# Patient Record
Sex: Male | Born: 1980 | Race: White | Hispanic: No | Marital: Single | Smoking: Former smoker
Health system: Southern US, Community
[De-identification: ages and names within clinical notes are randomized; demographics above are authoritative.]

## PROBLEM LIST (undated history)

## (undated) DIAGNOSIS — I4510 Unspecified right bundle-branch block: Secondary | ICD-10-CM

## (undated) DIAGNOSIS — J302 Other seasonal allergic rhinitis: Secondary | ICD-10-CM

## (undated) DIAGNOSIS — R200 Anesthesia of skin: Secondary | ICD-10-CM

## (undated) DIAGNOSIS — Z973 Presence of spectacles and contact lenses: Secondary | ICD-10-CM

## (undated) DIAGNOSIS — C20 Malignant neoplasm of rectum: Secondary | ICD-10-CM

## (undated) DIAGNOSIS — E46 Unspecified protein-calorie malnutrition: Secondary | ICD-10-CM

## (undated) DIAGNOSIS — T451X5A Adverse effect of antineoplastic and immunosuppressive drugs, initial encounter: Secondary | ICD-10-CM

## (undated) DIAGNOSIS — L309 Dermatitis, unspecified: Secondary | ICD-10-CM

## (undated) DIAGNOSIS — Z8672 Personal history of thrombophlebitis: Secondary | ICD-10-CM

## (undated) DIAGNOSIS — E611 Iron deficiency: Secondary | ICD-10-CM

## (undated) DIAGNOSIS — D630 Anemia in neoplastic disease: Secondary | ICD-10-CM

## (undated) DIAGNOSIS — Z9221 Personal history of antineoplastic chemotherapy: Secondary | ICD-10-CM

## (undated) DIAGNOSIS — Z923 Personal history of irradiation: Secondary | ICD-10-CM

## (undated) DIAGNOSIS — M1990 Unspecified osteoarthritis, unspecified site: Secondary | ICD-10-CM

## (undated) DIAGNOSIS — E8809 Other disorders of plasma-protein metabolism, not elsewhere classified: Secondary | ICD-10-CM

## (undated) DIAGNOSIS — Z933 Colostomy status: Secondary | ICD-10-CM

## (undated) DIAGNOSIS — K529 Noninfective gastroenteritis and colitis, unspecified: Secondary | ICD-10-CM

## (undated) DIAGNOSIS — G620 Drug-induced polyneuropathy: Secondary | ICD-10-CM

## (undated) DIAGNOSIS — R110 Nausea: Secondary | ICD-10-CM

## (undated) HISTORY — PX: WISDOM TOOTH EXTRACTION: SHX21

## (undated) HISTORY — DX: Dermatitis, unspecified: L30.9

---

## 2014-01-22 HISTORY — PX: COLONOSCOPY W/ BIOPSIES: SHX1374

## 2014-01-26 ENCOUNTER — Other Ambulatory Visit: Payer: Self-pay | Admitting: Gastroenterology

## 2014-01-26 DIAGNOSIS — K6289 Other specified diseases of anus and rectum: Secondary | ICD-10-CM

## 2014-01-29 ENCOUNTER — Encounter (INDEPENDENT_AMBULATORY_CARE_PROVIDER_SITE_OTHER): Payer: Self-pay

## 2014-01-29 ENCOUNTER — Other Ambulatory Visit: Payer: Self-pay | Admitting: Gastroenterology

## 2014-01-29 ENCOUNTER — Ambulatory Visit
Admission: RE | Admit: 2014-01-29 | Discharge: 2014-01-29 | Disposition: A | Discharge status: Home / Self Care | Payer: 59 | Source: Ambulatory Visit | Attending: Gastroenterology | Admitting: Gastroenterology

## 2014-01-29 DIAGNOSIS — K6289 Other specified diseases of anus and rectum: Secondary | ICD-10-CM

## 2014-01-29 MED ORDER — IOHEXOL 300 MG/ML  SOLN
100.0000 mL | Freq: Once | INTRAMUSCULAR | Status: AC | PRN
  Administered 2014-01-29: 100 mL via INTRAVENOUS

## 2014-01-29 NOTE — Addendum Note (Signed)
Addended by: Arta Silence on: 01/29/2014 04:06 PM   Modules accepted: Orders

## 2014-01-30 ENCOUNTER — Encounter (HOSPITAL_COMMUNITY)
Admission: RE | Disposition: A | Discharge status: Home / Self Care | Payer: Self-pay | Source: Ambulatory Visit | Attending: Gastroenterology

## 2014-01-30 ENCOUNTER — Ambulatory Visit (HOSPITAL_COMMUNITY)
Admission: RE | Admit: 2014-01-30 | Discharge: 2014-01-30 | Disposition: A | Discharge status: Home / Self Care | Payer: 59 | Source: Ambulatory Visit | Attending: Gastroenterology | Admitting: Gastroenterology

## 2014-01-30 ENCOUNTER — Encounter (INDEPENDENT_AMBULATORY_CARE_PROVIDER_SITE_OTHER): Payer: Self-pay | Admitting: General Surgery

## 2014-01-30 ENCOUNTER — Encounter (HOSPITAL_COMMUNITY): Payer: Self-pay | Admitting: Gastroenterology

## 2014-01-30 DIAGNOSIS — C20 Malignant neoplasm of rectum: Secondary | ICD-10-CM | POA: Insufficient documentation

## 2014-01-30 HISTORY — PX: EUS: SHX5427

## 2014-01-30 SURGERY — ULTRASOUND, LOWER GI TRACT, ENDOSCOPIC
Anesthesia: Moderate Sedation

## 2014-01-30 MED ORDER — FENTANYL CITRATE 0.05 MG/ML IJ SOLN
INTRAMUSCULAR | Status: AC
  Filled 2014-01-30: qty 2

## 2014-01-30 MED ORDER — DIPHENHYDRAMINE HCL 50 MG/ML IJ SOLN
INTRAMUSCULAR | Status: AC
  Filled 2014-01-30: qty 1

## 2014-01-30 MED ORDER — MIDAZOLAM HCL 10 MG/2ML IJ SOLN
INTRAMUSCULAR | Status: DC | PRN
  Administered 2014-01-30: 4 mg via INTRAVENOUS
  Administered 2014-01-30 (×2): 2 mg via INTRAVENOUS

## 2014-01-30 MED ORDER — SODIUM CHLORIDE 0.9 % IV SOLN: INTRAVENOUS | Status: DC

## 2014-01-30 MED ORDER — SODIUM CHLORIDE 0.9 % IV SOLN
INTRAVENOUS | Status: DC
  Administered 2014-01-30: 500 mL via INTRAVENOUS

## 2014-01-30 MED ORDER — FENTANYL CITRATE 0.05 MG/ML IJ SOLN
INTRAMUSCULAR | Status: DC | PRN
  Administered 2014-01-30: 25 ug via INTRAVENOUS
  Administered 2014-01-30: 50 ug via INTRAVENOUS
  Administered 2014-01-30: 25 ug via INTRAVENOUS

## 2014-01-30 MED ORDER — SPOT INK MARKER SYRINGE KIT
PACK | SUBMUCOSAL | Status: AC
  Filled 2014-01-30: qty 5

## 2014-01-30 MED ORDER — MIDAZOLAM HCL 5 MG/ML IJ SOLN
INTRAMUSCULAR | Status: AC
  Filled 2014-01-30: qty 2

## 2014-01-30 NOTE — Op Note (Signed)
[facility] [place], [address]   OPERATIVE PROCEDURE REPORT  PATIENT: Seth Villanueva, Seth Villanueva  MR#: [id number] BIRTHDATE: 08-11-1980  GENDER: Male ENDOSCOPIST: Arta Silence, MD REFERRED BY:  Daiva Eves, M.D., Teena Irani, MD PROCEDURE DATE:  01/30/2014 PROCEDURE:   Flexible sigmoidoscopy EUS, submucosal injection, mucosal biopsies ASA CLASS:   Class I INDICATIONS:1.  rectal mass. MEDICATIONS: Fentanyl 100 mcg IV and Versed 8 mg IV  DESCRIPTION OF PROCEDURE:   After the risks benefits and alternatives of the procedure were thoroughly explained, informed consent was obtained.  Throughout the procedure, the patients blood pressure, pulse and oxygen saturations were monitored continuously. Under direct visualization, the radial echoendoscope followed by standard gastroscope was introduced through the anus  and advanced to the sigmoid colon .  Water was used as necessary to provide an acoustic interface.  Imaging was obtained at 7.5 and 12Mhz. Upon completion of the imaging, water was removed and the patient was sent to the recovery room in satisfactory condition.    FINDINGS:   Sigmoidoscopy:  Tumor palpable on rectal exam, left posterior wall of distal rectum.  Tumor friable, exophytic, with large deep central ulceration.   Tumor extends from about 10cm from the anal verge nearly to the anal verge itself. After completion of ultrasound exam, the tumor was biopsied extensively with cold biopsy forceps.  The proximal and lateral margins of the tumor were marked with submucosal injection of Niger Ink. Rectal ultrasound:  Tumor is 50% circumferential.  Tumor penetrates through muscularis propria in multiple locations.  Several malignant-appearing peritumoral lymph nodes seen.  STAGING: T3 N2 Mx by endorectal ultrasound.  ENDOSCOPIC IMPRESSION: As above.  Locally advanced rectal mass.  Repeat biopsies done (prior biopsies showed high grade  dysplasia but no malignancy); imaging characteristics highly suggestive of locally invasive malignancy.  RECOMMENDATIONS: 1.  Watch for potential complications of procedure. 2.  Await biopsy results (sent "rush"). 3.  Based on today's findings, patient would highly likely need chemoradiative therapy prior to consideration of surgical intervention.   _______________________________ Lorrin MaisArta Silence, MD 01/30/2014 9:08 AM   CC:

## 2014-01-30 NOTE — H&P (Signed)
Patient interval history reviewed.  Patient examined again.  There has been no change from documented H/P dated 01/22/14 (scanned into chart from our office) except as documented above.  Assessment:  1.  Rectal cancer.  Plan:  1.  Endorectal ultrasound. 2.  Tattoo tumor site. 3.  Risks (bleeding, infection, bowel perforation that could require surgery, sedation-related changes in cardiopulmonary systems), benefits (identification and possible treatment of source of symptoms, exclusion of certain causes of symptoms), and alternatives (watchful waiting, radiographic imaging studies, empiric medical treatment) of endorectal ultrasound were explained to patient/family in detail and patient wishes to proceed.

## 2014-01-30 NOTE — Discharge Instructions (Signed)
Endorectal ultrasound  Post procedure instructions:  Read the instructions outlined below and refer to this sheet in the next few weeks. These discharge instructions provide you with general information on caring for yourself after you leave the hospital. Your doctor may also give you specific instructions. While your treatment has been planned according to the most current medical practices available, unavoidable complications occasionally occur. If you have any problems or questions after discharge, call Dr. Paulita Fujita at [facility] [phone]).  HOME CARE INSTRUCTIONS  ACTIVITY:  You may resume your regular activity, but move at a slower pace for the next 24 hours.   Take frequent rest periods for the next 24 hours.   Walking will help get rid of the air and reduce the bloated feeling in your belly (abdomen).   No driving for 24 hours (because of the medicine (anesthesia) used during the test).   You may shower.   Do not sign any important legal documents or operate any machinery for 24 hours (because of the anesthesia used during the test).  NUTRITION:  Drink plenty of fluids.   You may resume your normal diet as instructed by your doctor.   Begin with a light meal and progress to your normal diet. Heavy or fried foods are harder to digest and may make you feel sick to your stomach (nauseated).   Avoid alcoholic beverages for 24 hours or as instructed.  MEDICATIONS:  You may resume your normal medications unless your doctor tells you otherwise.  WHAT TO EXPECT TODAY:  Some feelings of bloating in the abdomen.   Passage of more gas than usual.   Spotting of blood in your stool or on the toilet paper.  IF YOU HAD POLYPS REMOVED DURING THE COLONOSCOPY:  No aspirin products for 7 days or as instructed.   No alcohol for 7 days or as instructed.   Eat a soft diet for the next 24 hours.   FINDING OUT THE RESULTS OF YOUR TEST  Not all test results are available  during your visit. If your test results are not back during the visit, make an appointment with your caregiver to find out the results. Do not assume everything is normal if you have not heard from your caregiver or the medical facility. It is important for you to follow up on all of your test results.     SEEK IMMEDIATE MEDICAL CARE IF:   You have more than a spotting of blood in your stool.   Your belly is swollen (abdominal distention).   You are nauseated or vomiting.   You have a fever.   You have abdominal pain or discomfort that is severe or gets worse throughout the day.    Document Released: 03/10/2004 Document Revised: 04/08/2011 Document Reviewed: 03/08/2008 [facility] Patient Information 2012 [place]. Transrectal Ultrasonography Transrectal ultrasonography is a procedure that sends and receives sound waves through the wall of your rectum into your prostate gland, which is situated in front of your rectum. The sound waves create an image of your prostate gland and nearby tissues. The image may be used to judge the size and shape of your prostate gland and nearby structures. LET [facility] CARE PROVIDER KNOW ABOUT:  Any allergies you have.  All medicines you are taking, including vitamins, herbs, eye drops, creams, and over-the-counter medicines.  Any blood disorders you have. BEFORE THE PROCEDURE Your health care provider may instruct you to use an enema 1-4 hours before the procedure. PROCEDURE You will lie  on an exam table, on your left side, with your knees bent up to your chest. A probe (transducer) will be lubricated with a clear gel and inserted into your rectum. You may experience a feeling of fullness of your rectum at this time. The transducer will be rotated slightly several times during the procedure to view the prostate gland and other structures from different angles. If blood flow is being checked, you may hear a "whoosh, whoosh" sound. AFTER THE  PROCEDURE You will be allowed to go home as soon as the procedure has been completed. Document Released: 05/06/2005 Document Revised: 03/29/2013 Document Reviewed: 01/16/2013 [facility] Patient Information 2015 [place], [place]. This information is not intended to replace advice given to you by your health care provider. Make sure you discuss any questions you have with your health care provider.

## 2014-01-31 ENCOUNTER — Encounter (HOSPITAL_COMMUNITY): Payer: Self-pay | Admitting: Gastroenterology

## 2014-01-31 ENCOUNTER — Ambulatory Visit (INDEPENDENT_AMBULATORY_CARE_PROVIDER_SITE_OTHER): Payer: 59 | Admitting: Surgery

## 2014-02-01 ENCOUNTER — Other Ambulatory Visit (INDEPENDENT_AMBULATORY_CARE_PROVIDER_SITE_OTHER): Payer: Self-pay | Admitting: General Surgery

## 2014-02-01 ENCOUNTER — Encounter (INDEPENDENT_AMBULATORY_CARE_PROVIDER_SITE_OTHER): Payer: Self-pay | Admitting: General Surgery

## 2014-02-01 ENCOUNTER — Ambulatory Visit (INDEPENDENT_AMBULATORY_CARE_PROVIDER_SITE_OTHER): Payer: 59 | Admitting: General Surgery

## 2014-02-01 ENCOUNTER — Telehealth (INDEPENDENT_AMBULATORY_CARE_PROVIDER_SITE_OTHER): Payer: Self-pay | Admitting: General Surgery

## 2014-02-01 ENCOUNTER — Ambulatory Visit
Admission: RE | Admit: 2014-02-01 | Discharge: 2014-02-01 | Disposition: A | Discharge status: Home / Self Care | Payer: 59 | Source: Ambulatory Visit | Attending: General Surgery | Admitting: General Surgery

## 2014-02-01 ENCOUNTER — Telehealth: Payer: Self-pay | Admitting: *Deleted

## 2014-02-01 VITALS — BP 129/86 | HR 77 | Temp 97.4°F | Resp 18 | Ht 74.0 in | Wt 158.2 lb

## 2014-02-01 DIAGNOSIS — C20 Malignant neoplasm of rectum: Secondary | ICD-10-CM

## 2014-02-01 LAB — CBC WITH DIFFERENTIAL/PLATELET
BASOS ABS: 0 10*3/uL (ref 0.0–0.1)
Basophils Relative: 0 % (ref 0–1)
EOS ABS: 0.3 10*3/uL (ref 0.0–0.7)
EOS PCT: 4 % (ref 0–5)
HEMATOCRIT: 46.4 % (ref 39.0–52.0)
Hemoglobin: 16.5 g/dL (ref 13.0–17.0)
Lymphocytes Relative: 17 % (ref 12–46)
Lymphs Abs: 1.2 10*3/uL (ref 0.7–4.0)
MCH: 31.6 pg (ref 26.0–34.0)
MCHC: 35.6 g/dL (ref 30.0–36.0)
MCV: 88.9 fL (ref 78.0–100.0)
MONO ABS: 0.8 10*3/uL (ref 0.1–1.0)
Monocytes Relative: 11 % (ref 3–12)
Neutro Abs: 4.7 10*3/uL (ref 1.7–7.7)
Neutrophils Relative %: 68 % (ref 43–77)
PLATELETS: 183 10*3/uL (ref 150–400)
RBC: 5.22 MIL/uL (ref 4.22–5.81)
RDW: 13.4 % (ref 11.5–15.5)
WBC: 6.9 10*3/uL (ref 4.0–10.5)

## 2014-02-01 MED ORDER — IOHEXOL 300 MG/ML  SOLN
75.0000 mL | Freq: Once | INTRAMUSCULAR | Status: AC | PRN
  Administered 2014-02-01: 75 mL via INTRAVENOUS

## 2014-02-01 NOTE — Patient Instructions (Signed)
You have rectal cancer By ultrasound, you are uT3N2 which suggests the cancer is in the lymph nodes around your rectum because of this we are recommending starting with chemotherapy and radiation to shrink the mass.  Seth Villanueva is the GI navigator. Her number is [phone] We will complete your workup which includes CT chest and labs.  You will be scheduled to see the medical oncologist and radiation oncologist.   Colorectal Cancer Colorectal cancer is an abnormal growth of tissue (tumor) in the colon or rectum that is cancerous (malignant). Unlike noncancerous (benign) tumors, malignant tumors can spread to other parts of your body. The colon is the large bowel or large intestine. The rectum is the last several inches of the colon.  RISK FACTORS The exact cause of colorectal cancer is unknown. However, the following factors may increase your chances of getting colorectal cancer:   Age older than 30 years.   Abnormal growths (polyps) on the inner wall of the colon or rectum.   Diabetes.   African American race.   Family history of hereditary nonpolyposis colorectal cancer. This condition is caused by changes in the genes that are responsible for repairing mismatched DNA.   Personal history of cancer. A person who has already had colorectal cancer may develop it a second time. Also, women with a history of ovarian, uterine, or breast cancer are at a somewhat higher risk of developing colorectal cancer.  Certain hereditary conditions.  Eating a diet that is high in fat (especially animal fat) and low in fiber, fruits, and vegetables.  Sedentary lifestyle.  Inflammatory bowel disease, including ulcerative colitis and Crohn's disease.   Smoking.   Excessive alcohol use.  SYMPTOMS Early colorectal cancer often does not cause symptoms. As the cancer grows, symptoms may include:   Changes in bowel habits.  Diarrhea.   Constipation.   Feeling like the bowel does not empty  completely after a bowel movement.   Blood in the stool.   Stools that are narrower than usual.   Abdominal discomfort, pain, bloating, fullness, or cramps.  Frequent gas pain.   Unexplained weight loss.   Constant tiredness.   Nausea and vomiting.  DIAGNOSIS  Your health care provider will ask about your medical history. He or she may also perform a number of procedures, such as:   A physical exam.  A digital rectal exam.  A fecal occult blood test.  A barium enema.  Blood tests.   X-rays.   Imaging tests, such as CT scans or MRIs.   Taking a tissue sample (biopsy) from your colon or rectum to look for cancer cells.   A sigmoidoscopy to view the inside of the last part of your colon.   A colonoscopy to view the inside of your entire colon.   An endorectal ultrasound to see how deep a rectal tumor has grown and whether the cancer has spread to lymph nodes or other nearby tissues.  Your cancer will be staged to determine its severity and extent. Staging is a careful attempt to find out the size of the tumor, whether the cancer has spread, and if so, to what parts of the body. You may need to have more tests to determine the stage of your cancer. The test results will help determine what treatment plan is best for you.   Stage 0--The cancer is found only in the innermost lining of the colon or rectum.   Stage I--The cancer has grown into the inner wall of the  colon or rectum. The cancer has not yet reached the outer wall of the colon.   Stage II--The cancer extends more deeply into or through the wall of the colon or rectum. It may have invaded nearby tissue, but cancer cells have not spread to the lymph nodes.   Stage III--The cancer has spread to nearby lymph nodes but not to other parts of the body.   Stage IV--The cancer has spread to other parts of the body, such as the liver or lungs.  Your health care provider may tell you the detailed stage  of your cancer, which includes both a number and a letter.  TREATMENT  Depending on the type and stage, colorectal cancer may be treated with surgery, radiation therapy, chemotherapy, targeted therapy, or radiofrequency ablation. Some people have a combination of these therapies. Surgery may be done to remove the polyps from your colon. In early stages, your health care provider may be able to do this during a colonoscopy. In later stages, surgery may be done to remove part of your colon.  HOME CARE INSTRUCTIONS   Only take over-the-counter or prescription medicines for pain, discomfort, or fever as directed by your health care provider.   Maintain a healthy diet.   Consider joining a support group. This may help you learn to cope with the stress of having colorectal cancer.   Seek advice to help you manage treatment of side effects.   Keep all follow-up appointments as directed by your health care provider.   Inform your cancer specialist if you are admitted to the hospital.  SEEK MEDICAL CARE IF:  Your diarrhea or constipation does not go away.   Your bowel habits change.  You have increased abdominal pain.   You notice new fatigue or weakness.  You lose weight. Document Released: 07/27/2005 Document Revised: 08/01/2013 Document Reviewed: 01/19/2013 [facility] Patient Information 2015 [place], [place]. This information is not intended to replace advice given to you by your health care provider. Make sure you discuss any questions you have with your health care provider.

## 2014-02-01 NOTE — Telephone Encounter (Signed)
Spoke with patient by phone and confirmed appointments with Dr. Benay Spice and Dr. Lisbeth Renshaw.  Contact names, numbers, and directions were provided.

## 2014-02-01 NOTE — Telephone Encounter (Signed)
Message copied by Maryclare Bean on Thu Feb 01, 2014  4:07 PM ------      Message from: Redmond Pulling, ERIC M      Created: Thu Feb 01, 2014  3:56 PM       pls call pt and let him know CT chest normal - no abnormalities ------

## 2014-02-01 NOTE — Progress Notes (Signed)
Patient ID: Seth Villanueva., male   DOB: 1981-03-10, 33 y.o.   MRN: [id number]  Chief Complaint  Patient presents with  . Rectal Pain  . Rectal Problems    HPI Seth Villanueva. is a 33 y.o. male.  HPI 33 year old Caucasian male referred by Dr. Teena Irani for evaluation of a rectal mass. The patient states he started to have problems in November. He describes it initially as bloody bowel movements. He initially contributed it to hemorrhoidal problems. He saw physician and was given suppositories which stopped the bleeding. He then had a return of blood and mucus in his stools along with now pain. He used suppositories again and had some relief but not complete relief. He ultimately ended up seeing GI imaging and was found to have a rectal mass on physical exam. 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 He then underwent a sigmoidoscopy and rectal ultrasound by Dr. Paulita Fujita which confirmed a more in-depth lesion a repeat biopsy this confirmed adenocarcinoma. The area was tattooed. He is underwent a CT scan of his abdomen and pelvis. He is sent here for surgical consultation.  He denies any significant pain right now with bowel movements. He reports daily bowel movements. He is not having to take any laxatives.he denies any abdominal pain or bloating. He denies any nausea or vomiting. He states he has had some dark blood since the biopsies. He reports a good appetite however he has lost some weight. Prior to all this he used to weigh 174 and now weighs around 160 pounds. However prior to being diagnosed he was exercising regularly until lost some weight through diet and exercise. He denies any difficulty urinating. He denies any family history of GI cancer. Her paternal grandmother had thyroid cancer. A paternal great aunt had breast cancer.  He works for [employer]. He used to smoke occasionally less  than one pack a month but stopped many years ago. He denies any drugs or alcohol. Past Medical History  Diagnosis Date  . Rectal polyp 2015  . Allergy     Past Surgical History  Procedure Laterality Date  . No past surgeries    . Wisdom tooth extraction    . Eus N/A 01/30/2014    Procedure: LOWER ENDOSCOPIC ULTRASOUND (EUS);  Surgeon: Arta Silence, MD;  [facility];  Service: Endoscopy;  Laterality: N/A;    History reviewed. No pertinent family history.  Social History History  Substance Use Topics  . Smoking status: Former Smoker    Types: Cigarettes    Quit date: 02/02/2012  . Smokeless tobacco: Not on file  . Alcohol Use: No    Allergies  Allergen Reactions  . Zithromax [Azithromycin] Hives    Current Outpatient Prescriptions  Medication Sig Dispense Refill  . acetaminophen (TYLENOL) 325 MG tablet Take 650 mg by mouth as needed.      . hydrocortisone (ANUSOL-HC) 25 MG suppository Place 25 mg rectally daily.      Marland Kitchen ibuprofen (ADVIL,MOTRIN) 200 MG tablet Take 200 mg by mouth every 6 (six) hours as needed.       No current facility-administered medications for this visit.    Review of Systems Review of Systems  Constitutional: Positive for appetite change and unexpected weight change. Negative for fever, chills and activity change.  HENT: Negative for congestion and trouble swallowing.   Eyes: Negative for visual disturbance.  Respiratory: Negative for chest tightness  and shortness of breath.   Cardiovascular: Negative for chest pain and leg swelling.       No PND, no orthopnea, no DOE  Gastrointestinal: Positive for blood in stool. Negative for nausea, vomiting, abdominal pain, constipation and abdominal distention.       See HPI  Genitourinary: Negative for dysuria, hematuria and difficulty urinating.       Variocele  Musculoskeletal: Negative.   Skin: Negative for rash.  Neurological: Negative for dizziness, seizures and speech difficulty.   Hematological: Does not bruise/bleed easily.  Psychiatric/Behavioral: Negative for behavioral problems and confusion.    Blood pressure 129/86, pulse 77, temperature 97.4 F (36.3 C), temperature source Temporal, resp. rate 18, height 6\' 2"  (1.88 m), weight 158 lb 3.2 oz (71.759 kg).  Physical Exam Physical Exam  Vitals reviewed. Constitutional: He is oriented to person, place, and time. He appears well-developed and well-nourished. No distress.  thin  HENT:  Head: Normocephalic and atraumatic.  Right Ear: External ear normal.  Left Ear: External ear normal.  Eyes: Conjunctivae are normal. No scleral icterus.  Neck: Normal range of motion. Neck supple. No tracheal deviation present. No thyromegaly present.  Cardiovascular: Normal rate, normal heart sounds and intact distal pulses.   Pulmonary/Chest: Effort normal and breath sounds normal. No respiratory distress. He has no wheezes.  Abdominal: Soft. He exhibits no distension. There is no tenderness. There is no rebound and no guarding.  Genitourinary: Testes normal and penis normal. Rectal exam shows mass. Rectal exam shows no external hemorrhoid, no fissure and anal tone normal.  Mass located in the posterior anus and rectum. It is palpable does not appear to be fixed. Tender to palpation in the area. Approximately 2-1/2 cm from the anal verge.  Musculoskeletal: Normal range of motion. He exhibits no edema and no tenderness.  Lymphadenopathy:    He has no cervical adenopathy.       Right: No inguinal adenopathy present.       Left: No inguinal adenopathy present.  Neurological: He is alert and oriented to person, place, and time. He exhibits normal muscle tone.  Skin: Skin is warm and dry. No rash noted. He is not diaphoretic. No erythema. No pallor.  Psychiatric: He has a normal mood and affect. His behavior is normal. Judgment and thought content normal.    Data Reviewed Rectal ultrasound - 01/30/2014 - sigmoidoscopy--tumor  palpable on rectal exam, left posterior wall the distal rectum. Tumor friable, exophytic, with large deep central ulceration. Tumor extends for about 10 cm from the anal verge nearly to the anal verge itself. Tumor biopsy. Proximal and lateral margins of the tumor were marked with Niger ink. Rectal ultrasound: Tumors 50% circumferential. Tumor penetrates through muscularis propria in multiple locations. Several malignant appearing peritumoral lymph node seens - uT3uN2Mx CT scan 01/29/2014-normal liver. No evidence of hepatic metastasis disease. Eccentric left-sided rectal wall lesion measuring 3.4 x 2.9 cm. There are enlarged perirectal lymph nodes. There is 11 mm lymph node on the left side and an enlarged presacral lymph node bilaterally. No iliac or retroperitoneal adenopathy. Dr Amedeo Plenty note Cbc, cmet[facility] - wbc 6.1; hgb 16.3, hct 45.6, plt 167; CMET wnl Cr 0.99; CEA 3.9 Colonoscopy 01/26/2014-fungating nonobstructing large mass in the distal rectum. The mass was partially circumferential-involving one half of the lumens of circumference-. The mass measured 4 cm in length. In addition its diameter measured 25 mm. Colonoscopy was performed to the cecum. Biopsy results from his colonoscopy demonstrated superficial fragments of  tubular villous adenoma with focal high-grade dysplasia Assessment    Locally advanced low rectal cancer (uT3uN2Mx - rectal ultrasound)     Plan    He is accompanied by his mother and father. We went over the workup done to date. We discussed rectal cancer. We discussed its overall management. On CT imaging it does not appear to involve the pelvic floor musculature. On physical exam does not appear to be fixed. He is not having any obstructive symptoms  Therefore I am recommending proceeding with neoadjuvant chemotherapy and radiation.   I explained that generally when rectal cancer is locally advanced we try to shrink the tumor with chemotherapy and  radiation. Once he has undergone a round of treatment, I explained he would be restaged to determine his response to treatment and generally surgery would follow.  We had a very prolonged conversation. They were given information and education materials.  His mother was quite anxious to begin treatment. She appear to be mildly frustrated that he was not diagnosed sooner.  We briefly discussed surgical options. I explained that part of the decision depends on how he responds to treatment. His lesion is very low and close to the anal verge. I explained that our primary goal is to do the procedure and treatment course that gives him the best chance for long-term survival. Secondly our goal would try to do a sphincter sparing procedure. However I cautioned them that I cannot guarantee at this time that he may not end up being recommended for an abdominal perineal resection. I explained that when the time for surgery comes he will have at least a temporary ostomy of some form or perhaps a permanent colostomy. We discussed the rationale of why he may need a temporary ileostomy if by chance we're able to do a sphincter sparing procedure.  We will go ahead and order a CT scan of his chest to complete his oncologic workup. Will also repeat some of his labs to make sure his creatinine did not bump after his most recent CT scan. We'll also request urgent appointments with medical and radiation oncology. I discussed the case with Dr. Benay Spice.  I spent 60 minutes with the patient and his family, more than 50% was spent in counseling and coordinating care.  Seth Ruff. Redmond Pulling, MD, FACS General, Bariatric, & Minimally Invasive Surgery [facility], [place]        [facility] M 02/01/2014, 9:21 AM

## 2014-02-01 NOTE — Telephone Encounter (Signed)
Spoke with the mother and went over the CT chest and told her that it was normal. The patient has an apt on 02-07-2014 with Dr Lisbeth Renshaw and I told her that Marcellus Scott is working an apt with Dr Benay Spice and she may get an apt this week or when he is over at the cancer center. But I did tell her once we get the lab work back me or Dr Redmond Pulling will call them

## 2014-02-02 ENCOUNTER — Telehealth (INDEPENDENT_AMBULATORY_CARE_PROVIDER_SITE_OTHER): Payer: Self-pay | Admitting: General Surgery

## 2014-02-02 ENCOUNTER — Encounter: Payer: Self-pay | Admitting: Radiation Oncology

## 2014-02-02 LAB — CEA: CEA: 2.4 ng/mL (ref 0.0–5.0)

## 2014-02-02 LAB — COMPREHENSIVE METABOLIC PANEL
ALK PHOS: 106 U/L (ref 39–117)
ALT: 16 U/L (ref 0–53)
AST: 14 U/L (ref 0–37)
Albumin: 4.8 g/dL (ref 3.5–5.2)
BILIRUBIN TOTAL: 0.7 mg/dL (ref 0.2–1.2)
BUN: 14 mg/dL (ref 6–23)
CO2: 25 mEq/L (ref 19–32)
CREATININE: 0.97 mg/dL (ref 0.50–1.35)
Calcium: 9.2 mg/dL (ref 8.4–10.5)
Chloride: 103 mEq/L (ref 96–112)
Glucose, Bld: 90 mg/dL (ref 70–99)
Potassium: 4 mEq/L (ref 3.5–5.3)
SODIUM: 142 meq/L (ref 135–145)
TOTAL PROTEIN: 7.2 g/dL (ref 6.0–8.3)

## 2014-02-02 NOTE — Progress Notes (Signed)
GU Location of Tumor / Histology: Rectum   Seth Villanueva. presented  months ago with signs/symptoms of: bloody bowel movements since November,2014, thought was heorrhoids,sawMD given suppositories which stopped bleeding ,then had return of blood and mucus in stools with pain  Biopsies of   revealed: Diagnosis 01/30/14:  Rectum, biopsy, mass- 6 pieces - INVASIVE ADENOCARCINOMA, Dr. Shon Baton  Past/Anticipated interventions by urology, if PZW:CHENIDPOEUMPN and rectal U/S  by Dr.Outlaw, referral to Dr. [name].Seth Villanueva,  Who saw patient 02/01/14   Past/Anticipated interventions by medical oncology, if any: Appt Dr.Sherrill 02/06/14,   Weight changes, if any: 14lb wt.loss  Bowel/Bladder complaints, if any: no bleeding,   Daily bm,no dysuria, painm after bm today   Nausea/Vomiting, if any: None  Pain issues, if any: mild discomfort from hemorrhoids   SAFETY ISSUES:  Prior radiation? No  Pacemaker/ICD? No  Is the patient on methotrexate?  No  Current Complaints / other details:   Single, paternal grandmother thyroid  Cancer,paternal great aunt breast cance, mother,father living, smoked cigarettes remotely, quit years ago, no alcohol or illicit drugs

## 2014-02-02 NOTE — Telephone Encounter (Signed)
Message copied by Maryclare Bean on Fri Feb 02, 2014 12:03 PM ------      Message from: Crisoforo Oxford      Created: Fri Feb 02, 2014 11:53 AM      Regarding: test results      Contact: [place] pt mom called to get test results, pls call and advise . ty TT ------

## 2014-02-02 NOTE — Telephone Encounter (Signed)
I called the mother back and told her that the lab work is not back and once it does it will be me or Dr. Redmond Pulling will call them with the results.

## 2014-02-03 ENCOUNTER — Telehealth (INDEPENDENT_AMBULATORY_CARE_PROVIDER_SITE_OTHER): Payer: Self-pay | Admitting: General Surgery

## 2014-02-03 NOTE — Telephone Encounter (Signed)
Called pt to let him know labs were ok. Explained purpose of CEA number

## 2014-02-06 ENCOUNTER — Ambulatory Visit (HOSPITAL_BASED_OUTPATIENT_CLINIC_OR_DEPARTMENT_OTHER): Payer: 59 | Admitting: Oncology

## 2014-02-06 ENCOUNTER — Telehealth: Payer: Self-pay | Admitting: Oncology

## 2014-02-06 ENCOUNTER — Encounter: Payer: Self-pay | Admitting: Oncology

## 2014-02-06 ENCOUNTER — Ambulatory Visit: Payer: 59

## 2014-02-06 VITALS — BP 138/77 | HR 68 | Temp 98.3°F | Resp 20 | Ht 74.0 in | Wt 158.4 lb

## 2014-02-06 DIAGNOSIS — C20 Malignant neoplasm of rectum: Secondary | ICD-10-CM

## 2014-02-06 NOTE — Telephone Encounter (Signed)
Gave pt appt appt for labs, rehab,nutrition, chemo class , lab and MD

## 2014-02-06 NOTE — Progress Notes (Signed)
[facility] New Patient Consult   Referring MD: Makayla Confer. 33 y.o.  1980-12-21    Reason for Referral: Rectal cancer   HPI: He reports an episode of rectal bleeding and diarrhea in November of 2014. His symptoms improved after using "hemorrhoid "suppositories. Beginning in January of this year she developed rectal pain that improved with ibuprofen . He reports passing mucous per rectum.  He was referred to Dr. Amedeo Plenty and on rectal exam and a polypoid mass was noted posteriorly. The stool was Hemoccult positive. He was taken to a colonoscopy 01/26/2014. A nonobstructing mass was found in the distal rectum. The mass was partially circumferential. The mass was biopsied. The exam was otherwise normal. The pathology revealed superficial fragments of a tubulovillous adenoma with focal high-grade dysplasia.  He was referred to Dr. Paulita Fujita for an endoscopic ultrasound 01/30/2014. Tumor was noted extending from the anal verge to 10 cm from the anal verge. The tumor was biopsied. The proximal and lateral margins were tattooed. Tumor was noted to penetrate through the muscular propria in multiple locations with several malignant-appearing. Tumor lymph nodes (T3 N2). The pathology ([id number]) confirmed invasive adenocarcinoma.  He was referred for CTs of the chest, abdomen, and pelvis 02/01/2014. No evidence of metastatic disease in the chest. CTs of the abdomen and pelvis 01/29/2014 revealed clear lung bases. No evidence of hepatic metastatic disease. A left-sided rectal wall mass was seen with enlarged perirectal lymph nodes. No iliac or retroperitoneal adenopathy. No inguinal mass or adenopathy.  He was referred to Dr. Redmond Pulling and was seen 02/01/2014. A mass was located in the posterior anus and rectum and did not appear fixed. The mass was measured approximately 2 cm from the anal verge.    Past Medical History  Diagnosis Date  .  2015  . Allergy   . Cancer  01/30/14    rectum-Invasive adenocarcinoma ([id number])     Past Surgical History  Procedure Laterality Date  . No past surgeries    . Wisdom tooth extraction    . Eus N/A 01/30/2014    Procedure: LOWER ENDOSCOPIC ULTRASOUND (EUS);  Surgeon: Arta Silence, MD;  Location[facility];  Service: Endoscopy;  Laterality: N/A;  . Rectal biopsy  01/30/14    Invasive Adenocarcinoma  . Colonoscopy w/ biopsies  01/26/14    fungating nonobstructing large mass distal rectum    Medications: Reviewed  Allergies:  Allergies  Allergen Reactions  . Zithromax [Azithromycin] Hives    Family history: His paternal grandmother had thyroid cancer. A paternal great aunt had rest cancer twice. No other family history of cancer. He has one brother.  Social History:   He lives with his parents in [place]. He works in a Cabin crew. He does not use tobacco or alcohol. No transfusion history. No risk factor for HIV or hepatitis.  History  Alcohol Use No    History  Smoking status  . Former Smoker  . Types: Cigarettes  . Quit date: 02/02/2012  Smokeless tobacco  . Not on file      ROS:   Positives include: 15 pound weight loss she relates to exercise, good appetite, mucous per rectum, rectal pain relieved with ibuprofen, recent "spider bite "  A complete ROS was otherwise negative.  Physical Exam:  Blood pressure 138/77, pulse 68, temperature 98.3 F (36.8 C), temperature source Oral, resp. rate 20, height 6\' 2"  (1.88 m), weight 158 lb 6.4 oz (71.85 kg).  HEENT: Oropharynx without  visible mass, neck without mass Lungs: Clear bilaterally Cardiac: Regular rate and rhythm Abdomen: No hepatosplenomegaly, nontender, no mass Rectal: There is a soft mass at the posterior rectum beginning approximately 2 cm from the anal verge GU: Testes without mass  Vascular: No leg edema Lymph nodes: No cervical or supraclavicular nodes. "Shotty "bilateral axillary and inguinal nodes Neurologic: Alert  and oriented, the motor exam appears grossly intact Skin: No rash Musculoskeletal: No spine tenderness   LAB:  CBC  Lab Results  Component Value Date   WBC 6.9 02/01/2014   HGB 16.5 02/01/2014   HCT 46.4 02/01/2014   MCV 88.9 02/01/2014   PLT 183 02/01/2014   NEUTROABS 4.7 02/01/2014     CMP      Component Value Date/Time   NA 142 02/01/2014 1037   K 4.0 02/01/2014 1037   CL 103 02/01/2014 1037   CO2 25 02/01/2014 1037   GLUCOSE 90 02/01/2014 1037   BUN 14 02/01/2014 1037   CREATININE 0.97 02/01/2014 1037   CALCIUM 9.2 02/01/2014 1037   PROT 7.2 02/01/2014 1037   ALBUMIN 4.8 02/01/2014 1037   AST 14 02/01/2014 1037   ALT 16 02/01/2014 1037   ALKPHOS 106 02/01/2014 1037   BILITOT 0.7 02/01/2014 1037    Lab Results  Component Value Date   CEA 2.4 02/01/2014    Imaging:  As per history of present illness, CT abdomen and pelvis 01/29/2014 reviewed   Assessment/Plan:   1. Rectal cancer, clinical stage III, distal rectal mass-approximate 2 cm from the anal verge, status post an endoscopic biopsy 01/30/2014 confirming an invasive adenocarcinoma  CTs of the chest, abdomen, and pelvis with no evidence of metastatic disease, malignant-appearing perirectal lymph nodes on the abdomen/pelvis CT 01/29/2014  EUS 01/30/2014 confirmed a uT3,uN2 lesion   2. Rectal pain secondary to #1   Disposition:   Mr. Kantner has been diagnosed with locally advanced rectal cancer. I discussed treatment options with Mr. Rodelo and his family. I explained the rationale behind neoadjuvant therapy. Hopefully he will be a candidate for sphincter sparing surgery following neoadjuvant chemotherapy and radiation.  I recommend concurrent capecitabine and radiation. We reviewed the potential toxicities associated with capecitabine including the chance for nausea, mucositis, diarrhea, and hematologic toxicity. We discussed the rash, hyperpigmentation, and hand/foot syndrome associated with capecitabine. We  discussed the radiation/chemotherapy skin breakdown that can be seen with combined therapy. He agrees to proceed. Mr. Charo will attend a chemotherapy teaching class.  He is scheduled to see Dr. Lisbeth Renshaw 02/07/2014. I anticipate he will start chemotherapy and radiation 02/19/2014.  We discussed the possibility of infertility following chemotherapy and radiation. He is interested in sperm banking and was given reading information on scheduling this.    [place], [place] 02/06/2014, 2:37 PM

## 2014-02-06 NOTE — Progress Notes (Signed)
Met with Seth Villanueva. and family. Explained role of nurse navigator. Educational information provided on colorectal cancer  Drug information on Xeloda was provided.  Referral made to dietician for diet education. Referral was made to PT for pre-habilitation.  [place] resources provided to patient, including SW service and GI support group information.  Patient was given information on sperm banking as well as contact numbers for appointments.  He was encouraged to contact this office if he needs assistance with obtaining an appointment.  Contact names and phone numbers were provided for entire [facility] team.  Teach back method was used.  His family was educated per MD on risk of colorectal cancer in first degree relative and the need for screening.  Patient and his family had no questions and verbalized understanding.  Will continue to follow as needed.

## 2014-02-07 ENCOUNTER — Encounter: Payer: Self-pay | Admitting: Radiation Oncology

## 2014-02-07 ENCOUNTER — Ambulatory Visit
Admission: RE | Admit: 2014-02-07 | Discharge: 2014-02-07 | Disposition: A | Discharge status: Home / Self Care | Payer: 59 | Source: Ambulatory Visit | Attending: Radiation Oncology | Admitting: Radiation Oncology

## 2014-02-07 VITALS — BP 134/85 | HR 89 | Temp 97.8°F | Resp 20 | Ht 74.0 in | Wt 158.1 lb

## 2014-02-07 DIAGNOSIS — Z510 Encounter for antineoplastic radiation therapy: Secondary | ICD-10-CM | POA: Diagnosis not present

## 2014-02-07 DIAGNOSIS — C20 Malignant neoplasm of rectum: Secondary | ICD-10-CM | POA: Diagnosis not present

## 2014-02-07 NOTE — Progress Notes (Signed)
Please see the Nurse Progress Note in the MD Initial Consult Encounter for this patient. 

## 2014-02-08 ENCOUNTER — Other Ambulatory Visit: Payer: Self-pay | Admitting: *Deleted

## 2014-02-08 ENCOUNTER — Other Ambulatory Visit: Payer: 59

## 2014-02-08 DIAGNOSIS — C20 Malignant neoplasm of rectum: Secondary | ICD-10-CM

## 2014-02-08 MED ORDER — PROCHLORPERAZINE MALEATE 10 MG PO TABS: 10.0000 mg | ORAL_TABLET | Freq: Four times a day (QID) | ORAL | Status: DC | PRN

## 2014-02-08 MED ORDER — HYDROCORTISONE ACETATE 25 MG RE SUPP: 25.0000 mg | Freq: Every day | RECTAL | Status: DC

## 2014-02-08 NOTE — Telephone Encounter (Signed)
Notified that script is ready as well as script for compazine. Have not sent out Xeloda script yet. He requests note from Dr. Benay Spice that he may return to work on 02/14/14-would like to pick it up on 02/13/14.

## 2014-02-08 NOTE — Progress Notes (Signed)
Radiation Oncology         [phone]) [phone] ________________________________  Name: Seth Villanueva. MRN: [id number]  Date: 02/07/2014  DOB: Jul 14, 1981  CV:ELFYBOF,BPZWC L, MD  Gayland Curry, MD   G. Kavin Leech, M.D.  REFERRING PHYSICIAN: Gayland Curry, MD   DIAGNOSIS: The encounter diagnosis was Rectal cancer.   HISTORY OF PRESENT ILLNESS::Seth Villanueva. is a 33 y.o. male who is seen for an initial consultation visit. The patient indicates that he experienced some rectal bleeding and diarrhea last year in November. However his symptoms improved although he began experiencing some rectal discomfort in January of 2015. This improved with ibuprofen.  The patient was noted on more recent rectal exam to have a rectal mass posteriorly. He was noted to be Hemoccult positive. The patient therefore was taken for a colonoscopy on 01/26/2014. A mass which was non-obstructing was present within the distal rectum. This was partially circumferential. A biopsy was obtained and this returned positive for focal high-grade dysplasia.  The patient proceeded to undergo an endoscopic ultrasound on 01/30/2014. Tumor was palpable on rectal exam along the left posterior wall of the distal rectum. The tumor was noted to be friable and exophytic with ulceration. This extended from about 10 cm from the anal verge to nearly the anal verge itself. On ultrasound the tumor was 50% circumferential. This corresponded to a T3 N2 tumor on ultrasound. Biopsy was obtained and this returned positive for invasive adenocarcinoma.  The patient underwent a CT scan of the chest abdomen and pelvis. No evidence of distant metastatic metastatic disease within the chest. A left-sided rectal wall lesion was seen within the pelvis measuring 3.4 cm. Enlarged and likely pathologic peri rectal lymph nodes were present. No iliac or retroperitoneal adenopathy and no inguinal adenopathy. No signs of hepatic metastatic disease.  The  patient has been seen by Dr. Redmond Pulling and surgery. On exam he felt that the tumor extended to approximately 2 cm from the anal verge. He discuss possible surgical options with the patient including an APR as well as sphincter sparing approaches. The patient also has been seen by Dr. Benay Spice in medical oncology who has recommended preoperative chemo radiotherapy, as did Dr. Redmond Pulling. I have therefore been asked to see the patient today for evaluation for preoperative radiation treatment.   PREVIOUS RADIATION THERAPY: No   PAST MEDICAL HISTORY:  has a past medical history of Rectal polyp (2015); Allergy; Cancer (01/30/14); and Anxiety.     PAST SURGICAL HISTORY: Past Surgical History  Procedure Laterality Date  . No past surgeries    . Wisdom tooth extraction    . Eus N/A 01/30/2014    Procedure: LOWER ENDOSCOPIC ULTRASOUND (EUS);  Surgeon: Arta Silence, MD;  Location: [facility] ENDOSCOPY;  Service: Endoscopy;  Laterality: N/A;  . Rectal biopsy  01/30/14    Invasive Adenocarcinoma  . Colonoscopy w/ biopsies  01/26/14    fungating nonobstructing large mass distal rectum     FAMILY HISTORY: family history includes Cancer in his maternal aunt, maternal aunt, and paternal grandmother; Cancer (age of onset: 32) in his maternal grandmother.   SOCIAL HISTORY:  reports that he quit smoking about 2 years ago. His smoking use included Cigarettes. He smoked 0.00 packs per day. He has quit using smokeless tobacco. He reports that he does not drink alcohol or use illicit drugs.   ALLERGIES: Zithromax   MEDICATIONS:  Current Outpatient Prescriptions  Medication Sig Dispense Refill  . acetaminophen (TYLENOL) 325 MG tablet  Take 650 mg by mouth as needed.      . hydrocortisone (ANUSOL-HC) 25 MG suppository Place 25 mg rectally daily.      Marland Kitchen ibuprofen (ADVIL,MOTRIN) 200 MG tablet Take 200 mg by mouth every 6 (six) hours as needed.       No current facility-administered medications for this encounter.      REVIEW OF SYSTEMS:  A 15 point review of systems is documented in the electronic medical record. This was obtained by the nursing staff. However, I reviewed this with the patient to discuss relevant findings and make appropriate changes.  Pertinent items are noted in HPI.    PHYSICAL EXAM:  height is 6\' 2"  (1.88 m) and weight is 158 lb 1.6 oz (71.714 kg). His oral temperature is 97.8 F (36.6 C). His blood pressure is 134/85 and his pulse is 89. His respiration is 20.   ECOG = 1  0 - Asymptomatic (Fully active, able to carry on all predisease activities without restriction)  1 - Symptomatic but completely ambulatory (Restricted in physically strenuous activity but ambulatory and able to carry out work of a light or sedentary nature. For example, light housework, office work)  2 - Symptomatic, <50% in bed during the day (Ambulatory and capable of all self care but unable to carry out any work activities. Up and about more than 50% of waking hours)  3 - Symptomatic, >50% in bed, but not bedbound (Capable of only limited self-care, confined to bed or chair 50% or more of waking hours)  4 - Bedbound (Completely disabled. Cannot carry on any self-care. Totally confined to bed or chair)  5 - Death   Eustace Pen MM, Creech RH, Tormey DC, et al. [phone]). "Toxicity and response criteria of the [facility]". [place] Oncol. 5 (6): 649-55  General: Well-developed, in no acute distress HEENT: Normocephalic, atraumatic; oral cavity clear Neck: Supple without any lymphadenopathy Cardiovascular: Regular rate and rhythm Respiratory: Clear to auscultation bilaterally GI: Soft, nontender, normal bowel sounds Extremities: No edema present Neuro: No focal deficits Rectal:  No external lesions. A palpable mass is present on digital rectal exam posteriorly approximately 2 cm from the anal verge. This is exophytic. No visible blood on exam glove.     LABORATORY DATA:  Lab  Results  Component Value Date   WBC 6.9 02/01/2014   HGB 16.5 02/01/2014   HCT 46.4 02/01/2014   MCV 88.9 02/01/2014   PLT 183 02/01/2014   Lab Results  Component Value Date   NA 142 02/01/2014   K 4.0 02/01/2014   CL 103 02/01/2014   CO2 25 02/01/2014   Lab Results  Component Value Date   ALT 16 02/01/2014   AST 14 02/01/2014   ALKPHOS 106 02/01/2014   BILITOT 0.7 02/01/2014      RADIOGRAPHY: Ct Chest W Contrast  02/01/2014   CLINICAL DATA:  33 year old male with newly diagnosed rectal mass, and biopsy on 01/30/2014 revealing invasive adenocarcinoma. Staging. Subsequent encounter.  EXAM: CT CHEST WITH CONTRAST  TECHNIQUE: Multidetector CT imaging of the chest was performed during intravenous contrast administration.  CONTRAST:  50mL OMNIPAQUE IOHEXOL 300 MG/ML  SOLN  COMPARISON:  CT Abdomen and Pelvis 01/29/2014.  FINDINGS: Major airways are patent. The right lung is clear. The left lung is clear except for minimal curvilinear scarring or atelectasis at the inferior pulmonary ligament, unchanged.  No pericardial or pleural effusion. Negative thoracic inlet. Minimal residual thymus. No mediastinal or hilar lymphadenopathy. No  axillary lymphadenopathy.  Stable and negative visible liver, gallbladder, spleen, pancreas, adrenal glands, and kidneys (small volume contrast excretion into the left renal upper pole pyramids).  No osseous abnormality identified.  IMPRESSION: Negative chest CT, with no metastatic disease identified.   Electronically Signed   By: Lars Pinks M.D.   On: 02/01/2014 13:51   Ct Abdomen Pelvis W Contrast  01/29/2014   CLINICAL DATA:  Rectal mass.  EXAM: CT ABDOMEN AND PELVIS WITH CONTRAST  TECHNIQUE: Multidetector CT imaging of the abdomen and pelvis was performed using the standard protocol following bolus administration of intravenous contrast.  CONTRAST:  120mL OMNIPAQUE IOHEXOL 300 MG/ML  SOLN  COMPARISON:  None.  FINDINGS: The lung bases are clear. No pulmonary nodules or pleural  effusion. The heart is normal in size. No pericardial effusion. The distal esophagus is unremarkable.  The liver is normal. No evidence of hepatic metastatic disease. No biliary dilatation. The gallbladder is normal. No common bowel duct dilatation. The pancreas is normal. The spleen is normal. The adrenal glands and kidneys are normal.  The stomach, duodenum and small bowel are unremarkable. The colon is normal. The appendix is normal. There is not E centric left-sided rectal wall mass best seen on the coronal images and measuring approximately 3.4 x 2.9 cm. There are enlarged perirectal lymph nodes. There is an 11 mm lymph node on the left side on image number 75 and there are enlarged presacral lymph nodes bilaterally. No iliac or retroperitoneal adenopathy. A few small scattered lymph nodes are noted.  The aorta and branch vessels are normal. The major venous structures are patent. The bladder, prostate gland and seminal vesicles are unremarkable. No inguinal mass or adenopathy.  The bony structures are intact.  No metastatic bone lesions.  IMPRESSION: Eccentric left-sided rectal wall lesion measuring a maximum of 3.4 x 2.9 cm. There are enlarged and likely pathologic perirectal lymph nodes. No iliac or retroperitoneal adenopathy and no inguinal adenopathy.  No findings for metastatic hepatic disease.   Electronically Signed   By: Kalman Jewels M.D.   On: 01/29/2014 16:25       IMPRESSION: The patient is a 33 year old male with a new diagnosis of rectal cancer, T3, N2, M0. This is a distal rectal cancer approximately 2 cm from the anal verge. I believe that the patient is an appropriate candidate for preoperative chemoradiation treatment.  I therefore discussed with the patient a potential 5-1/2 week course of radiation treatment. I discussed with him the rationale of this treatment in terms of both local control and possible sphincter sparing. I also discussed with the patient the possible side effects  and risks of treatment as well. All of his questions were answered. The patient is interested in proceeding with treatment in the near future. Tentatively, he has discussed with medical oncology beginning his treatment on 02/19/2014 and this is very feasible.  We also discussed possible impact on fertility and this is a very real issue I believe for him. I discussed with him the real possibility of sterility after such a treatment. He is interested potentially in sperm banking and he has been given information regarding this possibility. He will contact him later today and proceed with this avenue as soon as possible. However, both he and his family indicated strongly that they did not wish to delay beginning his treatment at all to pursue such avenues. They are steadfast and wanting to begin his radiation treatment on 02/19/2014.   PLAN: The patient will undergo  simulation therefore in the near future such that we can proceed with treatment planning. We will begin his treatment on 02/19/2014 according to his wishes. He understands that we can't alter this if necessary.      ________________________________   Jodelle Gross, MD, PhD   **Disclaimer: This note was dictated with voice recognition software. Similar sounding words can inadvertently be transcribed and this note may contain transcription errors which may not have been corrected upon publication of note.**

## 2014-02-12 ENCOUNTER — Other Ambulatory Visit: Payer: Self-pay | Admitting: *Deleted

## 2014-02-12 ENCOUNTER — Ambulatory Visit
Admission: RE | Admit: 2014-02-12 | Discharge: 2014-02-12 | Disposition: A | Discharge status: Home / Self Care | Payer: 59 | Source: Ambulatory Visit | Attending: Radiation Oncology | Admitting: Radiation Oncology

## 2014-02-12 ENCOUNTER — Encounter: Payer: Self-pay | Admitting: *Deleted

## 2014-02-12 ENCOUNTER — Telehealth: Payer: Self-pay | Admitting: *Deleted

## 2014-02-12 DIAGNOSIS — C20 Malignant neoplasm of rectum: Secondary | ICD-10-CM

## 2014-02-12 DIAGNOSIS — Z510 Encounter for antineoplastic radiation therapy: Secondary | ICD-10-CM | POA: Diagnosis not present

## 2014-02-12 MED ORDER — CAPECITABINE 500 MG PO TABS: 1500.0000 mg | ORAL_TABLET | Freq: Two times a day (BID) | ORAL | Status: DC

## 2014-02-12 NOTE — Progress Notes (Signed)
[place] Work  Clinical Social Work was referred by patient navigator for assessment of psychosocial needs due to new diagnosis and starting treatment.  Clinical Social Worker met with patient at [facility] to introduce self, explain role of CSW, to offer support and assess for needs.  Pt here with his parents today. Pt works at [employer] and has transportation to appointments. He plans to continue working throughout his treatment. CSW explained resources and team members available to assist. [place] reviewed [facility] Calendar and disciplines available to assist. Pt and family deny current concerns, but may have some financial questions depending on treatment costs after deductible is met. CSW educated pt and family about Mining engineer as well. They were appreciative of CSW contact and agree to call CSW as needed or reach out on future appointments.    Clinical Social Work interventions: Supportive listening Resource education  Loren Racer, [place] Clinical Social Worker [facility] for [place] Wednesday, Thursday and Friday Phone: [phone] Fax: [phone]

## 2014-02-12 NOTE — Progress Notes (Signed)
  Radiation Oncology         [phone]) [phone] ________________________________  Name: Seth Villanueva. MRN: [id number]  Date: 02/12/2014  DOB: 1981/07/05  SIMULATION AND TREATMENT PLANNING NOTE  The patient presented for simulation for the patient's upcoming course of preoperative radiation for the diagnosis of rectal cancer. The patient was placed in a supine position. A customized alpha cradle was constructed toaid in patient immobilization. This complex treatment device will be used on a daily basis during the treatment. In this fashion a CT scan was obtained through the pelvic region and the isocenter was placed near midline within the pelvis.  The patient will initially be planned to receive a course of radiation to a dose of 45 gray. This will be accomplished in 25 fractions at 1.8 gray per fraction. This initial treatment will correspond to a 3-D conformal technique. The gross tumor volume has been contoured in addition to the rectum, bladder and femoral heads. DVH's of each of these structures have been requested and these will be carefully reviewed as part of the 3-D conformal treatment planning process. To accomplish this initial treatment, 3 customized blocks have been designed for this purpose. Each of these 3 complex treatment devices will be used on a daily basis during the initial course of his treatment. It is anticipated that the patient will then receive a boost for an additional 5.4 gray. The anticipated total dose therefore will be 50.4 gray.  Special treatment procedure The patient will receive chemotherapy during the course of radiation treatment. The patient may experience increased or overlapping toxicity due to this combined-modality approach and the patient will be monitored for such problems. This may include extra lab work as necessary. This therefore constitutes a special treatment procedure.    ________________________________  Jodelle Gross, MD, PhD

## 2014-02-12 NOTE — Telephone Encounter (Signed)
Met with patient and family.  Reviewed schedule with patient.  Dr. Benay Spice sent out rx for Xeloda today.  Patient understands to inform this office by 02/15/14 if they have not heard where rx needs to come from.  Patient begins XRT on 02/19/14.  Letter written per MD for patient to take to his employer.  SW met with patient today for support.  Will continue to follow.

## 2014-02-13 ENCOUNTER — Encounter: Payer: Self-pay | Admitting: Oncology

## 2014-02-13 ENCOUNTER — Other Ambulatory Visit: Payer: 59

## 2014-02-13 ENCOUNTER — Encounter: Payer: Self-pay | Admitting: *Deleted

## 2014-02-13 ENCOUNTER — Ambulatory Visit: Payer: 59 | Admitting: Nutrition

## 2014-02-13 ENCOUNTER — Ambulatory Visit (INDEPENDENT_AMBULATORY_CARE_PROVIDER_SITE_OTHER): Payer: 59 | Admitting: General Surgery

## 2014-02-13 VITALS — Wt 161.0 lb

## 2014-02-13 DIAGNOSIS — E739 Lactose intolerance, unspecified: Secondary | ICD-10-CM

## 2014-02-13 NOTE — Progress Notes (Signed)
Faxed xeloda prescription to [facility] °

## 2014-02-13 NOTE — Progress Notes (Signed)
Put disability form on nurse's desk. °

## 2014-02-13 NOTE — Progress Notes (Signed)
33 year old male diagnosed with rectal cancer to receive concurrent Xeloda and radiation therapy.  He is a patient of Dr. Benay Spice.  Past medical history includes allergies and anxiety.  Medications include Compazine and Xeloda.  Height: 6 feet 2 inches. Weight: 161 pounds. Usual body weight: 174 - 195 pounds. BMI: 20.67.  Patient reports he is lactose intolerant.  He enjoys a variety of foods and many protein sources.  Currently patient denies nutrition side effects.  However, states appetite was poor several weeks ago.  Patient has been going to the gym and working out but definitely eating less contributing to recent weight loss.  Nutrition diagnosis: Unintended weight loss related to inadequate oral intake as evidenced by 7% weight loss from usual body weight.  Intervention: Patient educated to consume frequent meals and snacks incorporating high-calorie, high-protein foods for goals of weight maintenance or regain lost weight.  Encouraged patient find nutrition supplements that he enjoys.  Reviewed strategies for eating if diarrhea develops.  Provided samples of clear liquid oral nutrition supplements and protein powder.  Questions were answered.  Teach back method used.  Fact Sheets given.   Monitoring, evaluation, goals: Patient will tolerate adequate calories and protein for minimal weight loss throughout treatment.  Next visit: Patient will call me for questions or concerns.

## 2014-02-14 ENCOUNTER — Telehealth: Payer: Self-pay | Admitting: *Deleted

## 2014-02-14 ENCOUNTER — Encounter: Payer: Self-pay | Admitting: Oncology

## 2014-02-14 ENCOUNTER — Encounter: Payer: Self-pay | Admitting: Radiation Oncology

## 2014-02-14 ENCOUNTER — Ambulatory Visit: Payer: 59 | Admitting: Physical Therapy

## 2014-02-14 DIAGNOSIS — Z510 Encounter for antineoplastic radiation therapy: Secondary | ICD-10-CM | POA: Diagnosis not present

## 2014-02-14 NOTE — Progress Notes (Signed)
Faxed disability form to [employer] @ [phone]

## 2014-02-14 NOTE — Progress Notes (Signed)
Forwarded Seth Villanueva disability paperwork to RN for processing

## 2014-02-14 NOTE — Telephone Encounter (Signed)
Per pathology, unable to perform MSI testing on Access[employer]: [id number].  Dr. Benay Spice made aware and requested MSI blood draw at 03/05/14 lab visit.  Spoke with Rise Paganini in the lab who will arrange for this test on 03/05/14 visit.

## 2014-02-15 ENCOUNTER — Telehealth: Payer: Self-pay | Admitting: *Deleted

## 2014-02-15 ENCOUNTER — Encounter: Payer: Self-pay | Admitting: Oncology

## 2014-02-15 NOTE — Progress Notes (Signed)
[facility] Rx, [id number], and gave Larene Beach, pharmacist, patient's xeloda prescription over the phone.

## 2014-02-15 NOTE — Telephone Encounter (Signed)
VM from pharmacies noting his Xeloda must be filled by [facility]. Noted that managed care had already taken care of this.

## 2014-02-19 ENCOUNTER — Ambulatory Visit: Payer: 59 | Admitting: Radiation Oncology

## 2014-02-19 ENCOUNTER — Ambulatory Visit
Admission: RE | Admit: 2014-02-19 | Discharge: 2014-02-19 | Disposition: A | Discharge status: Home / Self Care | Payer: 59 | Source: Ambulatory Visit | Attending: Radiation Oncology | Admitting: Radiation Oncology

## 2014-02-19 ENCOUNTER — Encounter: Payer: Self-pay | Admitting: Radiation Oncology

## 2014-02-19 DIAGNOSIS — C20 Malignant neoplasm of rectum: Secondary | ICD-10-CM

## 2014-02-19 DIAGNOSIS — Z510 Encounter for antineoplastic radiation therapy: Secondary | ICD-10-CM | POA: Diagnosis not present

## 2014-02-19 NOTE — Progress Notes (Signed)
Rec'd [place] forms back from physician - forward to L2 to give to patient.  Made copy for scanning.

## 2014-02-20 ENCOUNTER — Ambulatory Visit: Payer: 59

## 2014-02-20 ENCOUNTER — Ambulatory Visit
Admission: RE | Admit: 2014-02-20 | Discharge: 2014-02-20 | Disposition: A | Discharge status: Home / Self Care | Payer: 59 | Source: Ambulatory Visit | Attending: Radiation Oncology | Admitting: Radiation Oncology

## 2014-02-20 DIAGNOSIS — Z510 Encounter for antineoplastic radiation therapy: Secondary | ICD-10-CM | POA: Diagnosis not present

## 2014-02-21 ENCOUNTER — Ambulatory Visit: Payer: 59 | Attending: Oncology | Admitting: Physical Therapy

## 2014-02-21 ENCOUNTER — Ambulatory Visit: Payer: 59

## 2014-02-21 ENCOUNTER — Other Ambulatory Visit: Payer: Self-pay | Admitting: Oncology

## 2014-02-21 ENCOUNTER — Ambulatory Visit
Admission: RE | Admit: 2014-02-21 | Discharge: 2014-02-21 | Disposition: A | Discharge status: Home / Self Care | Payer: 59 | Source: Ambulatory Visit | Attending: Radiation Oncology | Admitting: Radiation Oncology

## 2014-02-21 DIAGNOSIS — IMO0001 Reserved for inherently not codable concepts without codable children: Secondary | ICD-10-CM | POA: Diagnosis present

## 2014-02-21 DIAGNOSIS — Z510 Encounter for antineoplastic radiation therapy: Secondary | ICD-10-CM | POA: Diagnosis not present

## 2014-02-21 DIAGNOSIS — M2420 Disorder of ligament, unspecified site: Secondary | ICD-10-CM | POA: Insufficient documentation

## 2014-02-21 DIAGNOSIS — C20 Malignant neoplasm of rectum: Secondary | ICD-10-CM | POA: Diagnosis not present

## 2014-02-21 DIAGNOSIS — M629 Disorder of muscle, unspecified: Secondary | ICD-10-CM | POA: Diagnosis not present

## 2014-02-22 ENCOUNTER — Ambulatory Visit: Payer: 59

## 2014-02-22 ENCOUNTER — Ambulatory Visit
Admission: RE | Admit: 2014-02-22 | Discharge: 2014-02-22 | Disposition: A | Discharge status: Home / Self Care | Payer: 59 | Source: Ambulatory Visit | Attending: Radiation Oncology | Admitting: Radiation Oncology

## 2014-02-22 ENCOUNTER — Telehealth: Payer: Self-pay | Admitting: *Deleted

## 2014-02-22 DIAGNOSIS — Z510 Encounter for antineoplastic radiation therapy: Secondary | ICD-10-CM | POA: Diagnosis not present

## 2014-02-22 NOTE — Telephone Encounter (Signed)
Left VM asking if OK to take OTC sinus medication while on chemo? Called back and left VM that it is OK to take OTC sinus med-avoid aspirin. Call for fever.

## 2014-02-23 ENCOUNTER — Ambulatory Visit
Admission: RE | Admit: 2014-02-23 | Discharge: 2014-02-23 | Disposition: A | Discharge status: Home / Self Care | Payer: 59 | Source: Ambulatory Visit | Attending: Radiation Oncology | Admitting: Radiation Oncology

## 2014-02-23 ENCOUNTER — Ambulatory Visit: Payer: 59

## 2014-02-23 ENCOUNTER — Encounter: Payer: Self-pay | Admitting: Radiation Oncology

## 2014-02-23 VITALS — BP 121/70 | HR 79 | Temp 98.1°F | Resp 20 | Wt 161.8 lb

## 2014-02-23 DIAGNOSIS — Z510 Encounter for antineoplastic radiation therapy: Secondary | ICD-10-CM | POA: Diagnosis not present

## 2014-02-23 DIAGNOSIS — C20 Malignant neoplasm of rectum: Secondary | ICD-10-CM

## 2014-02-23 MED ORDER — ONDANSETRON HCL 8 MG PO TABS: 8.0000 mg | ORAL_TABLET | Freq: Three times a day (TID) | ORAL | Status: DC | PRN

## 2014-02-23 NOTE — Progress Notes (Addendum)
Weekly rad txs rectal 5 txs completed,  Patient education done,  Sitz bath given discusses side effects to report,, increase protein in diet, stay hydrated, drink plenty water,fluids, have imodium on hand prn for diarrhea, buy  babywipes  For bottom once becomes irritated, pat dry, , ,  no pain today, compazine makes him anxious cannot sleep at night, request something different for nausea, bowel movements  Daily, did take anusol suppository the other day for his hemorrhoids stated, ,  At night has a lot of gas, taking xeloda bid, energy level good , appetite good, te, Physical therapist gave tips for bowel movements   back, all questions answered, using stool, 9:37 AM  9:36 AM

## 2014-02-23 NOTE — Progress Notes (Signed)
  Radiation Oncology         [phone]) [phone] ________________________________  Name: Seth Villanueva. MRN: [id number]  Date: 02/23/2014  DOB: 01-Mar-1981  Weekly Radiation Therapy Management  Current Dose: 9 Gy     Planned Dose:  50.4 Gy  Narrative . . . . . . . . The patient presents for routine under treatment assessment.                                   The patient is without complaint. Weekly rad txs rectal 5 txs completed, Patient education done, Sitz bath given discusses side effects to report,, increase protein in diet, stay hydrated, drink plenty water,fluids, have imodium on hand prn for diarrhea, buy babywipes For bottom once becomes irritated, pat dry, , , no pain today, compazine makes him anxious cannot sleep at night, request something different for nausea, bowel movements Daily, did take anusol suppository the other day for his hemorrhoids stated, , At night has a lot of gas, taking xeloda bid, energy level good , appetite good, te, Physical therapist gave tips for bowel movements  back, all questions answered, using stool                                 Set-up films were reviewed.                                 The chart was checked. Physical Findings. . .  weight is 161 lb 12.8 oz (73.392 kg). His oral temperature is 98.1 F (36.7 C). His blood pressure is 121/70 and his pulse is 79. His respiration is 20. . Weight essentially stable.  No significant changes. Impression . . . . . . . The patient is tolerating radiation. Plan . . . . . . . . . . . . Continue treatment as planned.  Switched from compazine which caused anxiety to zofran.  ________________________________  Sheral Apley. Tammi Klippel, M.D.

## 2014-02-26 ENCOUNTER — Ambulatory Visit
Admission: RE | Admit: 2014-02-26 | Discharge: 2014-02-26 | Disposition: A | Discharge status: Home / Self Care | Payer: 59 | Source: Ambulatory Visit | Attending: Radiation Oncology | Admitting: Radiation Oncology

## 2014-02-26 ENCOUNTER — Ambulatory Visit: Payer: 59

## 2014-02-26 DIAGNOSIS — Z510 Encounter for antineoplastic radiation therapy: Secondary | ICD-10-CM | POA: Diagnosis not present

## 2014-02-27 ENCOUNTER — Ambulatory Visit: Payer: 59

## 2014-02-27 ENCOUNTER — Ambulatory Visit
Admission: RE | Admit: 2014-02-27 | Discharge: 2014-02-27 | Disposition: A | Discharge status: Home / Self Care | Payer: 59 | Source: Ambulatory Visit | Attending: Radiation Oncology | Admitting: Radiation Oncology

## 2014-02-27 DIAGNOSIS — Z510 Encounter for antineoplastic radiation therapy: Secondary | ICD-10-CM | POA: Diagnosis not present

## 2014-02-28 ENCOUNTER — Ambulatory Visit: Payer: 59

## 2014-02-28 ENCOUNTER — Ambulatory Visit
Admission: RE | Admit: 2014-02-28 | Discharge: 2014-02-28 | Disposition: A | Discharge status: Home / Self Care | Payer: 59 | Source: Ambulatory Visit | Attending: Radiation Oncology | Admitting: Radiation Oncology

## 2014-02-28 ENCOUNTER — Encounter: Payer: Self-pay | Admitting: Oncology

## 2014-02-28 DIAGNOSIS — Z510 Encounter for antineoplastic radiation therapy: Secondary | ICD-10-CM | POA: Diagnosis not present

## 2014-02-28 NOTE — Progress Notes (Signed)
Put fmla form on nurse's desk °

## 2014-03-01 ENCOUNTER — Ambulatory Visit: Payer: 59

## 2014-03-01 ENCOUNTER — Ambulatory Visit
Admission: RE | Admit: 2014-03-01 | Discharge: 2014-03-01 | Disposition: A | Discharge status: Home / Self Care | Payer: 59 | Source: Ambulatory Visit | Attending: Radiation Oncology | Admitting: Radiation Oncology

## 2014-03-01 DIAGNOSIS — Z510 Encounter for antineoplastic radiation therapy: Secondary | ICD-10-CM | POA: Diagnosis not present

## 2014-03-02 ENCOUNTER — Encounter: Payer: Self-pay | Admitting: Radiation Oncology

## 2014-03-02 ENCOUNTER — Ambulatory Visit
Admission: RE | Admit: 2014-03-02 | Discharge: 2014-03-02 | Disposition: A | Discharge status: Home / Self Care | Payer: 59 | Source: Ambulatory Visit | Attending: Radiation Oncology | Admitting: Radiation Oncology

## 2014-03-02 ENCOUNTER — Ambulatory Visit: Payer: 59

## 2014-03-02 VITALS — BP 139/74 | HR 81 | Resp 16 | Wt 164.2 lb

## 2014-03-02 DIAGNOSIS — Z510 Encounter for antineoplastic radiation therapy: Secondary | ICD-10-CM | POA: Diagnosis not present

## 2014-03-02 DIAGNOSIS — C20 Malignant neoplasm of rectum: Secondary | ICD-10-CM

## 2014-03-02 NOTE — Progress Notes (Signed)
   Department of Radiation Oncology  Phone:  [phone] Fax:        [phone]  Weekly Treatment Note    Name: Seth Villanueva. Date: 03/02/2014 MRN: [id number] DOB: 1981-03-23   Current dose: 18 Gy  Current fraction: 10   MEDICATIONS: Current Outpatient Prescriptions  Medication Sig Dispense Refill  . acetaminophen (TYLENOL) 325 MG tablet Take 650 mg by mouth as needed.      . capecitabine (XELODA) 500 MG tablet Take 3 tablets (1,500 mg total) by mouth 2 (two) times daily after a meal. On days of radiation only.  168 tablet  0  . hydrocortisone (ANUSOL-HC) 25 MG suppository Place 1 suppository (25 mg total) rectally daily.  24 suppository  1  . ibuprofen (ADVIL,MOTRIN) 200 MG tablet Take 200 mg by mouth every 6 (six) hours as needed.      . ondansetron (ZOFRAN) 8 MG tablet Take 1 tablet (8 mg total) by mouth every 8 (eight) hours as needed for nausea or vomiting.  30 tablet  0  . pseudoephedrine (SUDAFED) 30 MG tablet Take 30 mg by mouth every 4 (four) hours as needed for congestion.      . prochlorperazine (COMPAZINE) 10 MG tablet        No current facility-administered medications for this encounter.     ALLERGIES: Zithromax and Compazine   LABORATORY DATA:  Lab Results  Component Value Date   WBC 6.9 02/01/2014   HGB 16.5 02/01/2014   HCT 46.4 02/01/2014   MCV 88.9 02/01/2014   PLT 183 02/01/2014   Lab Results  Component Value Date   NA 142 02/01/2014   K 4.0 02/01/2014   CL 103 02/01/2014   CO2 25 02/01/2014   Lab Results  Component Value Date   ALT 16 02/01/2014   AST 14 02/01/2014   ALKPHOS 106 02/01/2014   BILITOT 0.7 02/01/2014     NARRATIVE: Seth Villanueva. was seen today for weekly treatment management. The chart was checked and the patient's films were reviewed. The patient is doing very well. He feels that his pain has significantly improved and he is not taking his pain medicines currently as he has not needed it recently. The patient has  gained some weight. Overall doing well with no diarrhea, nausea.  PHYSICAL EXAMINATION: weight is 164 lb 3.2 oz (74.481 kg). His blood pressure is 139/74 and his pulse is 81. His respiration is 16.        ASSESSMENT: The patient is doing satisfactorily with treatment.  PLAN: We will continue with the patient's radiation treatment as planned.

## 2014-03-02 NOTE — Progress Notes (Signed)
Reports taking zofran prior to xeloda as prescribed. Denies nausea or vomiting. Weight and vitals stable. Denies pain. States, "I have a lot of gas which causes me to cramp." Reports regular daily bowel movements. Denies diarrhea. Reports a good appetite. Denies fatigue.

## 2014-03-05 ENCOUNTER — Other Ambulatory Visit (HOSPITAL_BASED_OUTPATIENT_CLINIC_OR_DEPARTMENT_OTHER): Payer: 59

## 2014-03-05 ENCOUNTER — Telehealth: Payer: Self-pay | Admitting: Oncology

## 2014-03-05 ENCOUNTER — Ambulatory Visit (HOSPITAL_BASED_OUTPATIENT_CLINIC_OR_DEPARTMENT_OTHER): Payer: 59 | Admitting: Oncology

## 2014-03-05 ENCOUNTER — Other Ambulatory Visit (HOSPITAL_COMMUNITY)
Admission: RE | Admit: 2014-03-05 | Discharge: 2014-03-05 | Disposition: A | Discharge status: Home / Self Care | Payer: 59 | Source: Ambulatory Visit | Attending: Oncology | Admitting: Oncology

## 2014-03-05 ENCOUNTER — Ambulatory Visit: Payer: 59

## 2014-03-05 ENCOUNTER — Ambulatory Visit
Admission: RE | Admit: 2014-03-05 | Discharge: 2014-03-05 | Disposition: A | Discharge status: Home / Self Care | Payer: 59 | Source: Ambulatory Visit | Attending: Radiation Oncology | Admitting: Radiation Oncology

## 2014-03-05 VITALS — BP 137/76 | HR 73 | Temp 98.4°F | Resp 19 | Ht 74.0 in | Wt 162.4 lb

## 2014-03-05 DIAGNOSIS — C20 Malignant neoplasm of rectum: Secondary | ICD-10-CM | POA: Insufficient documentation

## 2014-03-05 DIAGNOSIS — Z510 Encounter for antineoplastic radiation therapy: Secondary | ICD-10-CM | POA: Diagnosis not present

## 2014-03-05 DIAGNOSIS — D6959 Other secondary thrombocytopenia: Secondary | ICD-10-CM

## 2014-03-05 LAB — CBC WITH DIFFERENTIAL/PLATELET
BASO%: 0.2 % (ref 0.0–2.0)
BASOS ABS: 0 10*3/uL (ref 0.0–0.1)
EOS ABS: 1.3 10*3/uL — AB (ref 0.0–0.5)
EOS%: 17.5 % — ABNORMAL HIGH (ref 0.0–7.0)
HCT: 45.8 % (ref 38.4–49.9)
HGB: 15.6 g/dL (ref 13.0–17.1)
LYMPH%: 11 % — AB (ref 14.0–49.0)
MCH: 31.4 pg (ref 27.2–33.4)
MCHC: 34.1 g/dL (ref 32.0–36.0)
MCV: 92.3 fL (ref 79.3–98.0)
MONO#: 0.4 10*3/uL (ref 0.1–0.9)
MONO%: 5.7 % (ref 0.0–14.0)
NEUT%: 65.6 % (ref 39.0–75.0)
NEUTROS ABS: 4.7 10*3/uL (ref 1.5–6.5)
Platelets: 119 10*3/uL — ABNORMAL LOW (ref 140–400)
RBC: 4.96 10*6/uL (ref 4.20–5.82)
RDW: 13.3 % (ref 11.0–14.6)
WBC: 7.2 10*3/uL (ref 4.0–10.3)
lymph#: 0.8 10*3/uL — ABNORMAL LOW (ref 0.9–3.3)

## 2014-03-05 NOTE — Telephone Encounter (Signed)
[facility] PT APPT SCHEDULE FOR AUG. PER BS HE WILL SEE PT ON 8/11 @ 9AM ALTHOUGH THIS DAY IS BLOCKED AS PAL HE WILLSEE PT'S IN THE AM. APPT SCHEDULED AT 9AM DUE TO XRT CONFLICT @ 1:69CV.

## 2014-03-05 NOTE — Progress Notes (Signed)
  [place] OFFICE PROGRESS NOTE   Diagnosis: Rectal cancer  INTERVAL HISTORY:   Seth Villanueva returns as scheduled. He began concurrent Xeloda and radiation 02/19/2014. He denies nausea, mouth sores, and diarrhea. Constipation was relieved when he took "fiber ". He had a small amount of rectal bleeding he was constipated. No hand or foot pain. The rectal pain has improved.  Objective:  Vital signs in last 24 hours:  Blood pressure 137/76, pulse 73, temperature 98.4 F (36.9 C), temperature source Oral, resp. rate 19, height 6\' 2"  (1.88 m), weight 162 lb 6.4 oz (73.664 kg), SpO2 100.00%.    HEENT: No thrush or ulcers Resp: Lungs clear bilaterally Cardio: Regular rate and rhythm GI: No hepatomegaly, nontender Vascular: No leg edema  Skin: Palms without erythema, no erythema or skin breakdown at the perineum     Lab Results:  Lab Results  Component Value Date   WBC 7.2 03/05/2014   HGB 15.6 03/05/2014   HCT 45.8 03/05/2014   MCV 92.3 03/05/2014   PLT 119* 03/05/2014   NEUTROABS 4.7 03/05/2014    Imaging:  No results found.  Medications: I have reviewed the patient's current medications.  Assessment/Plan: 1. 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 Rectal pain secondary to #1-improved 3. Mild thrombocytopenia secondary to Xeloda and radiation   Disposition:  Seth Villanueva appears to be tolerating the treatment well. He will begin a stool softener if the constipation persists. He will contact us for bleeding. We will check a CBC and chemistry panel 03/15/2014. He will return  for an office visit 03/20/2014.  Betsy Coder, MD  03/05/2014  8:52 AM

## 2014-03-06 ENCOUNTER — Ambulatory Visit
Admission: RE | Admit: 2014-03-06 | Discharge: 2014-03-06 | Disposition: A | Discharge status: Home / Self Care | Payer: 59 | Source: Ambulatory Visit | Attending: Radiation Oncology | Admitting: Radiation Oncology

## 2014-03-06 ENCOUNTER — Ambulatory Visit: Payer: 59

## 2014-03-06 DIAGNOSIS — Z510 Encounter for antineoplastic radiation therapy: Secondary | ICD-10-CM | POA: Diagnosis not present

## 2014-03-07 ENCOUNTER — Ambulatory Visit
Admission: RE | Admit: 2014-03-07 | Discharge: 2014-03-07 | Disposition: A | Discharge status: Home / Self Care | Payer: 59 | Source: Ambulatory Visit | Attending: Radiation Oncology | Admitting: Radiation Oncology

## 2014-03-07 ENCOUNTER — Ambulatory Visit: Payer: 59

## 2014-03-07 DIAGNOSIS — Z510 Encounter for antineoplastic radiation therapy: Secondary | ICD-10-CM | POA: Diagnosis not present

## 2014-03-08 ENCOUNTER — Telehealth: Payer: Self-pay | Admitting: *Deleted

## 2014-03-08 ENCOUNTER — Ambulatory Visit: Payer: 59

## 2014-03-08 ENCOUNTER — Ambulatory Visit
Admission: RE | Admit: 2014-03-08 | Discharge: 2014-03-08 | Disposition: A | Discharge status: Home / Self Care | Payer: 59 | Source: Ambulatory Visit | Attending: Radiation Oncology | Admitting: Radiation Oncology

## 2014-03-08 ENCOUNTER — Encounter: Payer: Self-pay | Admitting: Radiation Oncology

## 2014-03-08 ENCOUNTER — Ambulatory Visit: Payer: 59 | Admitting: Physical Therapy

## 2014-03-08 DIAGNOSIS — IMO0001 Reserved for inherently not codable concepts without codable children: Secondary | ICD-10-CM | POA: Diagnosis not present

## 2014-03-08 DIAGNOSIS — Z510 Encounter for antineoplastic radiation therapy: Secondary | ICD-10-CM | POA: Diagnosis not present

## 2014-03-08 NOTE — Telephone Encounter (Signed)
Called patient at home and spoke per Dr.Moody his FMLA papers will be completed today after 3pm MD is in [place] ,patient requested we fax the FMLA papaers dirctly to them as they have called him and said they needed them today ,I spoke with Elenore Rota and said they would be faxed sometime this afternoon,couldn't say exactly when ,he thanked me and Dr,Moody , he said this is so confusing they call me today and siad they had the papers and then called back and said they did not have the necessary paperwork " 11:52 AM

## 2014-03-08 NOTE — Progress Notes (Signed)
7.30.15 3:00PM:  [employer]iet[employer] Breach FMLA forms [phone]) and called patient to let him know.  Made copy for scanning.

## 2014-03-09 ENCOUNTER — Ambulatory Visit: Payer: 59

## 2014-03-09 ENCOUNTER — Ambulatory Visit
Admission: RE | Admit: 2014-03-09 | Discharge: 2014-03-09 | Disposition: A | Discharge status: Home / Self Care | Payer: 59 | Source: Ambulatory Visit | Attending: Radiation Oncology | Admitting: Radiation Oncology

## 2014-03-09 ENCOUNTER — Encounter: Payer: Self-pay | Admitting: Radiation Oncology

## 2014-03-09 VITALS — BP 127/75 | HR 79 | Temp 97.6°F | Resp 20 | Wt 161.6 lb

## 2014-03-09 DIAGNOSIS — C20 Malignant neoplasm of rectum: Secondary | ICD-10-CM

## 2014-03-09 DIAGNOSIS — Z510 Encounter for antineoplastic radiation therapy: Secondary | ICD-10-CM | POA: Diagnosis not present

## 2014-03-09 NOTE — Progress Notes (Signed)
Weekly radiation txs pelvis 15/25 completed, taking gas ex OTC now, was nauseated this am took nausea med and resolved, bowel movements better, mucous in stool, no blood, using sitz baths are helping, appetite okay 8:58 AM

## 2014-03-09 NOTE — Progress Notes (Signed)
   Department of Radiation Oncology  Phone:  [phone] Fax:        [phone]  Weekly Treatment Note    Name: Seth Villanueva. Date: 03/09/2014 MRN: [id number] DOB: 1980-10-16   Current dose: 27 Gy  Current fraction: 15   MEDICATIONS: Current Outpatient Prescriptions  Medication Sig Dispense Refill  . acetaminophen (TYLENOL) 325 MG tablet Take 650 mg by mouth as needed.      . capecitabine (XELODA) 500 MG tablet Take 3 tablets by mouth two times daily Monday through  Friday during radiation for 5 &amp; 1/2 weeks as directed  168 tablet  1  . hydrocortisone (ANUSOL-HC) 25 MG suppository Place 1 suppository (25 mg total) rectally daily.  24 suppository  1  . ibuprofen (ADVIL,MOTRIN) 200 MG tablet Take 200 mg by mouth every 6 (six) hours as needed.      . ondansetron (ZOFRAN) 8 MG tablet Take 1 tablet (8 mg total) by mouth every 8 (eight) hours as needed for nausea or vomiting.  30 tablet  0  . pseudoephedrine (SUDAFED) 30 MG tablet Take 30 mg by mouth every 4 (four) hours as needed for congestion.      . simethicone (MYLICON) 948 MG chewable tablet Chew 125 mg by mouth every 6 (six) hours as needed for flatulence (gel pill OTC).       No current facility-administered medications for this encounter.     ALLERGIES: Zithromax and Compazine   LABORATORY DATA:  Lab Results  Component Value Date   WBC 7.2 03/05/2014   HGB 15.6 03/05/2014   HCT 45.8 03/05/2014   MCV 92.3 03/05/2014   PLT 119* 03/05/2014   Lab Results  Component Value Date   NA 142 02/01/2014   K 4.0 02/01/2014   CL 103 02/01/2014   CO2 25 02/01/2014   Lab Results  Component Value Date   ALT 16 02/01/2014   AST 14 02/01/2014   ALKPHOS 106 02/01/2014   BILITOT 0.7 02/01/2014     NARRATIVE: Seth Villanueva. was seen today for weekly treatment management. The chart was checked and the patient's films were reviewed. The patient states that he is doing well. Bowel movements have continued to improve and  are more normal now. No significant bleeding per rectum. The patient has had some gas and is taking medication for this at the current time. He is using sitz baths which he feels helps.  PHYSICAL EXAMINATION: weight is 161 lb 9.6 oz (73.301 kg). His oral temperature is 97.6 F (36.4 C). His blood pressure is 127/75 and his pulse is 79. His respiration is 20.        ASSESSMENT: The patient is doing satisfactorily with treatment.  PLAN: We will continue with the patient's radiation treatment as planned.

## 2014-03-12 ENCOUNTER — Ambulatory Visit: Payer: 59

## 2014-03-12 ENCOUNTER — Telehealth: Payer: Self-pay | Admitting: *Deleted

## 2014-03-12 ENCOUNTER — Ambulatory Visit
Admission: RE | Admit: 2014-03-12 | Discharge: 2014-03-12 | Disposition: A | Discharge status: Home / Self Care | Payer: 59 | Source: Ambulatory Visit | Attending: Radiation Oncology | Admitting: Radiation Oncology

## 2014-03-12 ENCOUNTER — Telehealth: Payer: Self-pay

## 2014-03-12 DIAGNOSIS — Z510 Encounter for antineoplastic radiation therapy: Secondary | ICD-10-CM | POA: Diagnosis not present

## 2014-03-12 NOTE — Telephone Encounter (Signed)
Received phone call from patient inquiring about his short-term disability forms and his employer still needing them.  Spoke with Carmelina Noun in managed care department.  She will make sure the employer receives a copy.

## 2014-03-12 NOTE — Telephone Encounter (Signed)
Spoke with Pharmacist at [facility]. He wanted calrification if pt. Will need mote than the 168 tabs dispensed in July. Told pharmacist that the patient is receiving concurrent radiation and Xeloda from 02-19-14 through 03-28-14 Monday through Friday.  The 168 tabs dispensed will cover this treatment period and he will not need an additional refill at this time.

## 2014-03-13 ENCOUNTER — Ambulatory Visit: Payer: 59

## 2014-03-13 ENCOUNTER — Telehealth: Payer: Self-pay | Admitting: *Deleted

## 2014-03-13 ENCOUNTER — Ambulatory Visit
Admission: RE | Admit: 2014-03-13 | Discharge: 2014-03-13 | Disposition: A | Discharge status: Home / Self Care | Payer: 59 | Source: Ambulatory Visit | Attending: Radiation Oncology | Admitting: Radiation Oncology

## 2014-03-13 DIAGNOSIS — Z510 Encounter for antineoplastic radiation therapy: Secondary | ICD-10-CM | POA: Diagnosis not present

## 2014-03-13 NOTE — Telephone Encounter (Signed)
Received request for clarification of Xeloda Rx from [facility]. Confirmed with pt he has sufficient quantity to complete chemoradiation. Pt denies any hand/foot pain or mucositis. Had diarrhea, resolved with Imodium. Pt understands to call office for symptom management as needed.

## 2014-03-14 ENCOUNTER — Ambulatory Visit
Admission: RE | Admit: 2014-03-14 | Discharge: 2014-03-14 | Disposition: A | Discharge status: Home / Self Care | Payer: 59 | Source: Ambulatory Visit | Attending: Radiation Oncology | Admitting: Radiation Oncology

## 2014-03-14 ENCOUNTER — Ambulatory Visit: Payer: 59

## 2014-03-14 DIAGNOSIS — Z510 Encounter for antineoplastic radiation therapy: Secondary | ICD-10-CM | POA: Diagnosis not present

## 2014-03-15 ENCOUNTER — Ambulatory Visit: Payer: 59

## 2014-03-15 ENCOUNTER — Ambulatory Visit
Admission: RE | Admit: 2014-03-15 | Discharge: 2014-03-15 | Disposition: A | Discharge status: Home / Self Care | Payer: 59 | Source: Ambulatory Visit | Attending: Radiation Oncology | Admitting: Radiation Oncology

## 2014-03-15 ENCOUNTER — Other Ambulatory Visit (HOSPITAL_BASED_OUTPATIENT_CLINIC_OR_DEPARTMENT_OTHER): Payer: 59

## 2014-03-15 DIAGNOSIS — Z510 Encounter for antineoplastic radiation therapy: Secondary | ICD-10-CM | POA: Diagnosis not present

## 2014-03-15 DIAGNOSIS — C20 Malignant neoplasm of rectum: Secondary | ICD-10-CM

## 2014-03-15 LAB — COMPREHENSIVE METABOLIC PANEL (CC13)
ALBUMIN: 4.1 g/dL (ref 3.5–5.0)
ALT: 14 U/L (ref 0–55)
AST: 12 U/L (ref 5–34)
Alkaline Phosphatase: 98 U/L (ref 40–150)
Anion Gap: 9 mEq/L (ref 3–11)
BILIRUBIN TOTAL: 1.04 mg/dL (ref 0.20–1.20)
BUN: 19.4 mg/dL (ref 7.0–26.0)
CO2: 28 mEq/L (ref 22–29)
Calcium: 9.4 mg/dL (ref 8.4–10.4)
Chloride: 105 mEq/L (ref 98–109)
Creatinine: 1 mg/dL (ref 0.7–1.3)
Glucose: 92 mg/dl (ref 70–140)
POTASSIUM: 3.6 meq/L (ref 3.5–5.1)
Sodium: 142 mEq/L (ref 136–145)
TOTAL PROTEIN: 6.9 g/dL (ref 6.4–8.3)

## 2014-03-15 LAB — CBC WITH DIFFERENTIAL/PLATELET
BASO%: 0.3 % (ref 0.0–2.0)
Basophils Absolute: 0 10*3/uL (ref 0.0–0.1)
EOS%: 35.6 % — ABNORMAL HIGH (ref 0.0–7.0)
Eosinophils Absolute: 2.3 10*3/uL — ABNORMAL HIGH (ref 0.0–0.5)
HCT: 44.9 % (ref 38.4–49.9)
HGB: 15.5 g/dL (ref 13.0–17.1)
LYMPH%: 7 % — AB (ref 14.0–49.0)
MCH: 32.1 pg (ref 27.2–33.4)
MCHC: 34.4 g/dL (ref 32.0–36.0)
MCV: 93.3 fL (ref 79.3–98.0)
MONO#: 0.5 10*3/uL (ref 0.1–0.9)
MONO%: 8.3 % (ref 0.0–14.0)
NEUT%: 48.8 % (ref 39.0–75.0)
NEUTROS ABS: 3.2 10*3/uL (ref 1.5–6.5)
PLATELETS: 104 10*3/uL — AB (ref 140–400)
RBC: 4.81 10*6/uL (ref 4.20–5.82)
RDW: 13.7 % (ref 11.0–14.6)
WBC: 6.6 10*3/uL (ref 4.0–10.3)
lymph#: 0.5 10*3/uL — ABNORMAL LOW (ref 0.9–3.3)

## 2014-03-16 ENCOUNTER — Ambulatory Visit
Admission: RE | Admit: 2014-03-16 | Discharge: 2014-03-16 | Disposition: A | Discharge status: Home / Self Care | Payer: 59 | Source: Ambulatory Visit | Attending: Radiation Oncology | Admitting: Radiation Oncology

## 2014-03-16 ENCOUNTER — Other Ambulatory Visit: Payer: 59

## 2014-03-16 ENCOUNTER — Ambulatory Visit: Payer: 59

## 2014-03-16 DIAGNOSIS — Z510 Encounter for antineoplastic radiation therapy: Secondary | ICD-10-CM | POA: Diagnosis not present

## 2014-03-16 DIAGNOSIS — C20 Malignant neoplasm of rectum: Secondary | ICD-10-CM

## 2014-03-16 NOTE — Progress Notes (Signed)
Weekly rad txs pelvis/rectaum 20 completed , patient denies pain at present,  Took an imodium 1 tab last night, had 5-6 loose stools yesterday, said that has improved from last night, no nausea, appetite so/so, taking Xeloda bid, drinks a protein shake daily and eats something ever 2-3 hours stated,  No blood in stools 8:40 AM

## 2014-03-16 NOTE — Progress Notes (Signed)
   Department of Radiation Oncology  Phone:  [phone] Fax:        [phone]  Weekly Treatment Note    Name: Seth Villanueva. Date: 03/16/2014 MRN: [id number] DOB: 29-Jul-1981   Current dose: 36 Gy  Current fraction: 20   MEDICATIONS: Current Outpatient Prescriptions  Medication Sig Dispense Refill  . acetaminophen (TYLENOL) 325 MG tablet Take 650 mg by mouth as needed.      . capecitabine (XELODA) 500 MG tablet Take 3 tablets by mouth two times daily Monday through  Friday during radiation for 5 &amp; 1/2 weeks as directed  168 tablet  1  . hydrocortisone (ANUSOL-HC) 25 MG suppository Place 1 suppository (25 mg total) rectally daily.  24 suppository  1  . ibuprofen (ADVIL,MOTRIN) 200 MG tablet Take 200 mg by mouth every 6 (six) hours as needed.      . ondansetron (ZOFRAN) 8 MG tablet Take 1 tablet (8 mg total) by mouth every 8 (eight) hours as needed for nausea or vomiting.  30 tablet  0  . pseudoephedrine (SUDAFED) 30 MG tablet Take 30 mg by mouth every 4 (four) hours as needed for congestion.      . simethicone (MYLICON) 623 MG chewable tablet Chew 125 mg by mouth every 6 (six) hours as needed for flatulence (gel pill OTC).       No current facility-administered medications for this encounter.     ALLERGIES: Zithromax and Compazine   LABORATORY DATA:  Lab Results  Component Value Date   WBC 6.6 03/15/2014   HGB 15.5 03/15/2014   HCT 44.9 03/15/2014   MCV 93.3 03/15/2014   PLT 104* 03/15/2014   Lab Results  Component Value Date   NA 142 03/15/2014   K 3.6 03/15/2014   CL 103 02/01/2014   CO2 28 03/15/2014   Lab Results  Component Value Date   ALT 14 03/15/2014   AST 12 03/15/2014   ALKPHOS 98 03/15/2014   BILITOT 1.04 03/15/2014     NARRATIVE: Seth Villanueva. was seen today for weekly treatment management. The chart was checked and the patient's films were reviewed. The patient states he is doing well at this time. He had a little bit of diarrhea and has used Imodium  which was helpful.  PHYSICAL EXAMINATION: vitals were not taken for this visit.     alert, in no acute distress.  ASSESSMENT: The patient is doing satisfactorily with treatment.  PLAN: We will continue with the patient's radiation treatment as planned.

## 2014-03-19 ENCOUNTER — Ambulatory Visit: Payer: 59 | Admitting: Oncology

## 2014-03-19 ENCOUNTER — Ambulatory Visit: Payer: 59

## 2014-03-19 ENCOUNTER — Ambulatory Visit
Admission: RE | Admit: 2014-03-19 | Discharge: 2014-03-19 | Disposition: A | Discharge status: Home / Self Care | Payer: 59 | Source: Ambulatory Visit | Attending: Radiation Oncology | Admitting: Radiation Oncology

## 2014-03-19 DIAGNOSIS — Z510 Encounter for antineoplastic radiation therapy: Secondary | ICD-10-CM | POA: Diagnosis not present

## 2014-03-20 ENCOUNTER — Ambulatory Visit
Admission: RE | Admit: 2014-03-20 | Discharge: 2014-03-20 | Disposition: A | Discharge status: Home / Self Care | Payer: 59 | Source: Ambulatory Visit | Attending: Radiation Oncology | Admitting: Radiation Oncology

## 2014-03-20 ENCOUNTER — Ambulatory Visit: Payer: 59

## 2014-03-20 ENCOUNTER — Telehealth: Payer: Self-pay | Admitting: Oncology

## 2014-03-20 ENCOUNTER — Ambulatory Visit (HOSPITAL_BASED_OUTPATIENT_CLINIC_OR_DEPARTMENT_OTHER): Payer: 59 | Admitting: Oncology

## 2014-03-20 VITALS — BP 134/74 | HR 72 | Temp 97.6°F | Resp 18 | Ht 74.0 in | Wt 158.4 lb

## 2014-03-20 DIAGNOSIS — D696 Thrombocytopenia, unspecified: Secondary | ICD-10-CM

## 2014-03-20 DIAGNOSIS — C20 Malignant neoplasm of rectum: Secondary | ICD-10-CM

## 2014-03-20 DIAGNOSIS — Z510 Encounter for antineoplastic radiation therapy: Secondary | ICD-10-CM | POA: Diagnosis not present

## 2014-03-20 NOTE — Progress Notes (Signed)
Met with patient and his parents.  He is currently working and is up to date with his forms for FMLA.  He has lots of questions re: short-term disability, etc.  Referral made to SW, A. Elmore, who will contact the patient.  He was also advised to go see the [facility] re: questions about his medical bills.  Spoke with [name] in [place]. Onc. Re: his skin breakdown per request of Dr. Benay Spice.  Per Thayer Headings. Dr. Lisbeth Renshaw will see patient tomorrow.  Thayer Headings said that the patient could use A&D ointment for rectal area until seen by Dr. Lisbeth Renshaw tomorrow.  Dr. Benay Spice informed and okay with this plan.  Patient instructed of above.

## 2014-03-20 NOTE — Progress Notes (Signed)
  [place] OFFICE PROGRESS NOTE   Diagnosis: Rectal cancer  INTERVAL HISTORY:   Mr. Jeffreys returns as scheduled. He continues Xeloda and radiation. He reports malaise. He has occasional diarrhea relieved with Imodium. He has discomfort at the rectum with bowel movements. No hand or foot pain. No mouth sores. He takes Zofran daily for nausea. At the constant rectal pain has improved.  Objective:  Vital signs in last 24 hours:  Blood pressure 134/74, pulse 72, temperature 97.6 F (36.4 C), temperature source Oral, resp. rate 18, height 6\' 2"  (1.88 m), weight 158 lb 6.4 oz (71.85 kg).    HEENT: No thrush or ulcer Lymphatics: No cervical or supraclavicular nodes Resp: Lungs clear bilaterally Cardio: Regular rate and rhythm GI:  No hepatomegaly, nontender Vascular: No leg edema  Skin: Superficial skin breakdown at the anal verge and perineum with radiation hyperpigmentation at the perineum and gluteus . Skin dryness at the soles, no erythema    Lab Results:  Lab Results  Component Value Date   WBC 6.6 03/15/2014   HGB 15.5 03/15/2014   HCT 44.9 03/15/2014   MCV 93.3 03/15/2014   PLT 104* 03/15/2014   NEUTROABS 3.2 03/15/2014      Lab Results  Component Value Date   CEA 2.4 02/01/2014   Medications: I have reviewed the patient's current medications.  Assessment/Plan: 1. 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 Rectal pain secondary to #1-improved 3. Mild thrombocytopenia secondary to Xeloda and radiation 4. Radiation skin breakdown at the perineum-we will contact radiation  oncology regarding topical therapy   Disposition:  Mr. Clack continues to tolerate the Xeloda and radiation well. He is developing skin breakdown at the perineum. We will ask radiation oncology to recommend topical therapy. He will complete treatment 03/28/2014. He will return for an office and lab visit 04/11/2014. We will check a CBC on 03/22/2014.  Mr. Dancel will be referred to Dr. Marcello Moores for surgery planning.  Betsy Coder, MD  03/20/2014  9:33 AM

## 2014-03-20 NOTE — Telephone Encounter (Signed)
Pt confirmed labs/ov per 08/11 POF, spk w/ nurse will c/b to schedule apt.....gave pt AVS....KJ

## 2014-03-20 NOTE — Telephone Encounter (Signed)
Spk w/Tina confirming apt w/AT, lft msg for pt concerning apt w/AT at [facility] also mailed updated schedule to pt...Marland KitchenMarland KitchenKJ

## 2014-03-21 ENCOUNTER — Ambulatory Visit: Payer: 59

## 2014-03-21 ENCOUNTER — Ambulatory Visit
Admission: RE | Admit: 2014-03-21 | Discharge: 2014-03-21 | Disposition: A | Discharge status: Home / Self Care | Payer: 59 | Source: Ambulatory Visit | Attending: Radiation Oncology | Admitting: Radiation Oncology

## 2014-03-21 ENCOUNTER — Encounter: Payer: Self-pay | Admitting: Radiation Oncology

## 2014-03-21 VITALS — BP 135/81 | HR 76 | Resp 16 | Wt 158.3 lb

## 2014-03-21 DIAGNOSIS — C20 Malignant neoplasm of rectum: Secondary | ICD-10-CM

## 2014-03-21 DIAGNOSIS — Z510 Encounter for antineoplastic radiation therapy: Secondary | ICD-10-CM | POA: Diagnosis not present

## 2014-03-21 NOTE — Progress Notes (Signed)
   Department of Radiation Oncology  Phone:  [phone] Fax:        [phone]  Weekly Treatment Note    Name: Seth Villanueva. Date: 03/21/2014 MRN: [id number] DOB: [birth year]   Current dose: 41.4 Gy  Current fraction: 23   MEDICATIONS: Current Outpatient Prescriptions  Medication Sig Dispense Refill  . acetaminophen (TYLENOL) 325 MG tablet Take 650 mg by mouth as needed.      . capecitabine (XELODA) 500 MG tablet Take 3 tablets by mouth two times daily Monday through  Friday during radiation for 5 &amp; 1/2 weeks as directed  168 tablet  1  . hydrocortisone (ANUSOL-HC) 25 MG suppository Place 1 suppository (25 mg total) rectally daily.  24 suppository  1  . ibuprofen (ADVIL,MOTRIN) 200 MG tablet Take 200 mg by mouth every 6 (six) hours as needed.      . Loperamide HCl (IMODIUM A-D PO) Take 2 mg by mouth as needed.      . ondansetron (ZOFRAN) 8 MG tablet Take 1 tablet (8 mg total) by mouth every 8 (eight) hours as needed for nausea or vomiting.  30 tablet  0  . pseudoephedrine (SUDAFED) 30 MG tablet Take 30 mg by mouth every 4 (four) hours as needed for congestion.      . simethicone (MYLICON) 656 MG chewable tablet Chew 125 mg by mouth every 6 (six) hours as needed for flatulence (gel pill OTC).       No current facility-administered medications for this encounter.     ALLERGIES: Zithromax and Compazine   LABORATORY DATA:  Lab Results  Component Value Date   WBC 6.6 03/15/2014   HGB 15.5 03/15/2014   HCT 44.9 03/15/2014   MCV 93.3 03/15/2014   PLT 104* 03/15/2014   Lab Results  Component Value Date   NA 142 03/15/2014   K 3.6 03/15/2014   CL 103 02/01/2014   CO2 28 03/15/2014   Lab Results  Component Value Date   ALT 14 03/15/2014   AST 12 03/15/2014   ALKPHOS 98 03/15/2014   BILITOT 1.04 03/15/2014     NARRATIVE: Seth Villanueva. was seen today for weekly treatment management. The chart was checked and the patient's films were reviewed. The patient is complaining of  some increased irritation and itchiness. He is using suppositories on occasion which are helping as well as a and D. ointment. Also using sitz baths.  PHYSICAL EXAMINATION: weight is 158 lb 4.8 oz (71.804 kg). His blood pressure is 135/81 and his pulse is 76. His respiration is 16.      the patient's anal region shows some irritation but overall is holding up well. Erythema as expected.  ASSESSMENT: The patient is doing satisfactorily with treatment.  PLAN: We will continue with the patient's radiation treatment as planned. The patient is doing well overall and we'll continue his current regimen. I reassured him that his platelets are at a level currently where we would not expect the significant issues.

## 2014-03-21 NOTE — Progress Notes (Signed)
Patient concerned about low platelets and rectal irritation. Skin breakdown and rectal irritation noted between buttock. Patient reports using sitz bath, wet wipes and A&D ointment to affected area. Platelets 104. Mother questions if broccoli will increased platelet count. Explained broccoli would increased gas thus, cramping and not improve platelets much at all. Denies nausea or vomiting. Reports abdominal cramping with gas. Denies blood in stool. Reports imodium helps to manage loose stools. Complains rectal itching is the most irritating.

## 2014-03-22 ENCOUNTER — Encounter: Payer: Self-pay | Admitting: *Deleted

## 2014-03-22 ENCOUNTER — Ambulatory Visit: Payer: 59

## 2014-03-22 ENCOUNTER — Ambulatory Visit
Admission: RE | Admit: 2014-03-22 | Discharge: 2014-03-22 | Disposition: A | Discharge status: Home / Self Care | Payer: 59 | Source: Ambulatory Visit | Attending: Radiation Oncology | Admitting: Radiation Oncology

## 2014-03-22 ENCOUNTER — Telehealth: Payer: Self-pay | Admitting: *Deleted

## 2014-03-22 DIAGNOSIS — Z510 Encounter for antineoplastic radiation therapy: Secondary | ICD-10-CM | POA: Diagnosis not present

## 2014-03-22 NOTE — Telephone Encounter (Addendum)
Spoke with Elenore Rota regarding we are not able to say he could not work from 7/13 to 9/13 since he did actually work a few hours for a few days. He reports his last day of work on limited basis was 02/26/14 and has not worked from 7/21 to present and will be out until at least 04/22/14. He says his STD worker is aware that he worked limited hours for 1 week.  His case worker's name is Madeha at [phone]. Claim # [id number] Fax [phone]

## 2014-03-22 NOTE — Telephone Encounter (Signed)
Left VM for his case worker, Gerhard Munch to call back to discuss the specifics of the letter Christophe is requesting.

## 2014-03-22 NOTE — Progress Notes (Signed)
[place] Work  Clinical Social Work was referred by patient navigator for assessment of psychosocial needs due to some FMLA questions.  Clinical Social Worker contacted patient at home to offer support and assess for needs.  Pt reports he had been trying to work throughout his treatment, but now feels he needs some time to readjust and recoup. Pt requesting paperwork from medical team and is awaiting letter as requested for his HR dept. He reports his work is willing to work with him, but needs request in writing. Pt felt like medical team was working on this concern. CSW also left vm for desk rn to check in.   He denied other concerns currently and was looking forward to being done with treatment very soon.     Clinical Social Work interventions: Emotional support Solution focused discussion  Loren Racer, LCSW Clinical Social Worker [facility] [place] for [place] Wednesday, Thursday and Friday Phone: [phone] Fax: [phone]

## 2014-03-22 NOTE — Telephone Encounter (Signed)
His short-term disability will not pay him for missed days of work in intermittent basis. Requesting another letter from office that he will be out of work from 02/19/14 to 04/22/14. Would like to pick up letter on Friday.

## 2014-03-23 ENCOUNTER — Telehealth: Payer: Self-pay | Admitting: *Deleted

## 2014-03-23 ENCOUNTER — Ambulatory Visit
Admission: RE | Admit: 2014-03-23 | Discharge: 2014-03-23 | Disposition: A | Discharge status: Home / Self Care | Payer: 59 | Source: Ambulatory Visit | Attending: Radiation Oncology | Admitting: Radiation Oncology

## 2014-03-23 ENCOUNTER — Encounter (HOSPITAL_COMMUNITY): Payer: Self-pay

## 2014-03-23 ENCOUNTER — Encounter: Payer: Self-pay | Admitting: Radiation Oncology

## 2014-03-23 ENCOUNTER — Ambulatory Visit: Payer: 59

## 2014-03-23 VITALS — Wt 157.9 lb

## 2014-03-23 DIAGNOSIS — Z510 Encounter for antineoplastic radiation therapy: Secondary | ICD-10-CM | POA: Diagnosis not present

## 2014-03-23 DIAGNOSIS — C20 Malignant neoplasm of rectum: Secondary | ICD-10-CM

## 2014-03-23 NOTE — Progress Notes (Signed)
No issues per patient,using a&d Ointment prn and sitz baths, doing better, was seen in block of 5 , 2 days ago will See MD next [facility] per Dr.Moody 8:39 AM

## 2014-03-23 NOTE — Telephone Encounter (Signed)
After speaking with disability representative, [place] letter corrected and faxed as requested. Copy for patient to pick up next week at front desk. Patient notified.

## 2014-03-26 ENCOUNTER — Ambulatory Visit: Payer: 59

## 2014-03-26 ENCOUNTER — Encounter (INDEPENDENT_AMBULATORY_CARE_PROVIDER_SITE_OTHER): Payer: Self-pay | Admitting: General Surgery

## 2014-03-26 ENCOUNTER — Ambulatory Visit: Admission: RE | Admit: 2014-03-26 | Payer: 59 | Source: Ambulatory Visit | Admitting: Radiation Oncology

## 2014-03-26 ENCOUNTER — Ambulatory Visit
Admission: RE | Admit: 2014-03-26 | Discharge: 2014-03-26 | Disposition: A | Discharge status: Home / Self Care | Payer: 59 | Source: Ambulatory Visit | Attending: Radiation Oncology | Admitting: Radiation Oncology

## 2014-03-26 ENCOUNTER — Ambulatory Visit (INDEPENDENT_AMBULATORY_CARE_PROVIDER_SITE_OTHER): Payer: 59 | Admitting: General Surgery

## 2014-03-26 ENCOUNTER — Telehealth: Payer: Self-pay | Admitting: *Deleted

## 2014-03-26 VITALS — BP 118/81 | HR 80 | Temp 98.0°F | Resp 12 | Wt 157.7 lb

## 2014-03-26 VITALS — BP 150/78 | HR 94 | Temp 98.4°F | Ht 73.0 in | Wt 156.2 lb

## 2014-03-26 DIAGNOSIS — C20 Malignant neoplasm of rectum: Secondary | ICD-10-CM

## 2014-03-26 DIAGNOSIS — Z510 Encounter for antineoplastic radiation therapy: Secondary | ICD-10-CM | POA: Diagnosis not present

## 2014-03-26 MED ORDER — OXYCODONE-ACETAMINOPHEN 5-325 MG PO TABS: 1.0000 | ORAL_TABLET | Freq: Four times a day (QID) | ORAL | Status: DC | PRN

## 2014-03-26 NOTE — Telephone Encounter (Signed)
Called and spoke with father ,theay are in Surgeon's office scheduling his  Son's surgery, will stop back by nursing to get pain medication from [facility] 9:31 AM

## 2014-03-26 NOTE — Patient Instructions (Signed)
Colorectal Cancer  Colorectal cancer is the second most common cancer in the [place], striking 140,000 people annually and causing 60,000 deaths. That's a staggering figure when you consider the disease is potentially curable if diagnosed in the early stages. Who is at risk? Though colorectal cancer may occur at any age, more than 90% of the patients are over age 31, at which point the risk doubles every ten years. In addition to age, other high risk factors include a family history of colorectal cancer and polyps and a personal history of ulcerative colitis, colon polyps or cancer of other organs, especially of the breast or uterus. How does it start? It is generally agreed that nearly all colon and rectal cancer begins in benign polyps. These pre-malignant growths occur on the bowel wall and may eventually increase in size and become cancer. Removal of benign polyps is one aspect of preventive medicine that really works! What are the symptoms? The most common symptoms are rectal bleeding and changes in bowel habits, such as constipation or diarrhea. (These symptoms are also common in other diseases so it is important you receive a thorough examination should you experience them.) Abdominal pain and weight loss are usually late symptoms indicating possible extensive disease. Unfortunately, many polyps and early cancers fail to produce symptoms. Therefore, it is important that your routine physical includes colorectal cancer detection procedures once you reach age 50.  There are several methods for detection of colorectal cancer. These include digital rectal examination, a chemical test of the stool for blood, flexible sigmoidoscopy and colonoscopy (lighted tubular instruments used to inspect the lower bowel) and barium enema. Be sure to discuss these options with your surgeon to determine which procedure is best for you. Individuals who have a first-degree relative (parent or sibling) with colon  cancer or polyps should start their colon cancer screening at the age of 31. How is colorectal cancer treated? Colorectal cancer requires surgery in nearly all cases for complete cure. Radiation and chemotherapy are sometimes used in addition to surgery. Between 80-90% are restored to normal health if the cancer is detected and treated in the earliest stages. The cure rate drops to 50% or less when diagnosed in the later stages. Thanks to modern technology, less than 5% of all colorectal cancer patients require a colostomy, the surgical construction of an artificial excretory opening from the colon. Can colon cancer be prevented? Colon cancer is very preventable. The most important step towards preventing colon cancer is getting a screening test. Any abnormal screening test should be followed by a colonoscopy. Some individuals prefer to start with colonoscopy as a screening test. Colonoscopy provides a detailed examination of the bowel. Polyps can be identified and can often be removed during colonoscopy. Though not definitely proven, there is some evidence that diet may play a significant role in preventing colorectal cancer. As far as we know, a high fiber, low fat diet is the only dietary measure that might help prevent colorectal cancer. Finally, pay attention to changes in your bowel habits. Any new changes such as persistent constipation, diarrhea, or blood in the stool should be discussed with your physician.   Can hemorrhoids lead to colon cancer? No, but hemorrhoids may produce symptoms similar to colon polyps or cancer. Should you experience these symptoms, you should have them examined and evaluated by a physician, preferably by a colon and rectal surgeon. What is a colon and rectal surgeon? Colon and rectal surgeons are experts in the surgical and non-surgical treatment   of diseases of the colon, rectum and anus. They have completed advanced surgical training in the treatment of these  diseases as well as full general surgical training. Board-certified colon and rectal surgeons complete residencies in general surgery and colon and rectal surgery, and pass intensive examinations conducted by the American Board of Surgery and the American Board of Colon and Rectal Surgery. They are well-versed in the treatment of both benign and malignant diseases of the colon, rectum and anus and are able to perform routine screening examinations and surgically treat conditions if indicated to do so.  2012 American Society of Colon & Rectal Surgeons  

## 2014-03-26 NOTE — Addendum Note (Signed)
Addended by: [name] on: 8/87/5797 10:44 AM   Modules accepted: Orders

## 2014-03-26 NOTE — Progress Notes (Signed)
Diagnosis: Rectal cancer   HISTORY:  Seth Villanueva is a 33 y.o. M with rectal cancer referred by Dr. Teena Villanueva.  He is finishing his adjuvant treatment.  He has done well with minimal weight loss.  He does complain of perirectal burning.     The patient states he started to have problems in November. He describes it initially as bloody bowel movements. He initially contributed it to hemorrhoidal problems. He saw physician and was given suppositories which stopped the bleeding. He then had a return of blood and mucus in his stools along with now pain. He used suppositories again and had some relief but not complete relief. He ultimately ended up seeing GI imaging and was found to have a rectal mass on physical exam. 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 He then underwent a sigmoidoscopy and rectal ultrasound by Dr. Paulita Fujita which confirmed a more in-depth lesion a repeat biopsy this confirmed adenocarcinoma. The area was tattooed. He is underwent a CT scan of his abdomen and pelvis, which were negative for metastatic disease. He is sent here for surgical consultation.  He reports a good appetite however he has lost some weight. Prior to all this he used to weigh 174 and now weighs around 160 pounds. However prior to being diagnosed he was exercising regularly until lost some weight through diet and exercise. He denies any difficulty urinating. He denies any family history of GI cancer. Her paternal grandmother had thyroid cancer. A paternal great aunt had breast cancer.   He works for [employer]. He used to smoke occasionally less than one pack a month but stopped many years ago. He denies any drugs or alcohol.  Past Medical History  Diagnosis Date  . Rectal polyp 2015  . Allergy   . Cancer 01/30/14    rectum-Invasive adenocarcinoma  . Anxiety     new dx   Past Surgical History  Procedure Laterality  Date  . No past surgeries    . Wisdom tooth extraction    . Eus N/A 01/30/2014    Procedure: LOWER ENDOSCOPIC ULTRASOUND (EUS);  Surgeon: Arta Silence, MD;  Location: [facility] ENDOSCOPY;  Service: Endoscopy;  Laterality: N/A;  . Rectal biopsy  01/30/14    Invasive Adenocarcinoma  . Colonoscopy w/ biopsies  01/26/14    fungating nonobstructing large mass distal rectum   [name] does not currently have medications on file. History   Social History  . Marital Status: Single    Spouse Name: N/A    Number of Children: N/A  . Years of Education: N/A   Occupational History  . TECHNICIAN      [employer]   Social History Main Topics  . Smoking status: Former Smoker    Types: Cigarettes    Quit date: 02/02/2012  . Smokeless tobacco: Former Systems developer  . Alcohol Use: No  . Drug Use: No     Comment: remote smoked < 1pack a moth,quit many years ago, quit dipping 8 years ago  . Sexual Activity: Not Currently   Other Topics Concern  . Not on file   Social History Narrative   Single   Works at [employer] in [employer] lab   Allergies  Allergen Reactions  . Zithromax [Azithromycin] Hives  . Compazine [Prochlorperazine] Anxiety    Objective:  Vital signs in last 24 hours:  BP 150/78  Pulse 94  Temp(Src) 98.4 F (36.9 C) (Oral)  Ht 6'  1" (1.854 m)  Wt 156 lb 4 oz (70.875 kg)  BMI 20.62 kg/m2  SpO2 98%  Gen: NAD RRR CTA GI: No hepatomegaly, nontender   Lab Results:  Lab Results   Component  Value  Date    WBC  6.6  03/15/2014    HGB  15.5  03/15/2014    HCT  44.9  03/15/2014    MCV  93.3  03/15/2014    PLT  104*  03/15/2014    NEUTROABS  3.2  03/15/2014    Lab Results   Component  Value  Date    CEA  2.4  02/01/2014   Medications: I have reviewed the patient's current medications.  Assessment/Plan:  1. Rectal cancer, clinical stage III, distal rectal mass-approximate 2 cm from the anal verge, status post an endoscopic biopsy 01/30/2014 confirming an invasive adenocarcinoma CTs of the  chest, abdomen, and pelvis with no evidence of metastatic disease, malignant-appearing perirectal lymph nodes on the abdomen/pelvis CT 01/29/2014  EUS 01/30/2014 confirmed a uT3,uN2 lesion  Initiation of radiation and concurrent Xeloda 02/19/2014 2. Rectal pain secondary to #1 3. Mild thrombocytopenia secondary to Xeloda and radiation 4. Radiation skin breakdown at the perineum-we will contact radiation oncology regarding topical therapy Disposition:  He seems to have tolerated his therapy well.  We discussed surgical resection in detail. We discussed expectations afterwards as well as need for temporary ileostomy. I will reassess his tumor and 4 weeks to evaluate for progression. If the tumor is not invading the sphincter complex, I believe that we can attempt a low anterior resection with coloanal anastomosis. We discussed that if the tumor was invading into the sphincter complex, he would most definitely need an abdominal perineal resection.  Patient was counseled to continue a high protein diet in preparation for surgery.

## 2014-03-26 NOTE — Progress Notes (Signed)
   Department of Radiation Oncology  Phone:  [phone] Fax:        [phone]  Weekly Treatment Note    Name: Seth Villanueva. Date: 03/26/2014 MRN: [id number] DOB: 01-09-1981   Current dose: 46.8 Gy  Current fraction: 26   MEDICATIONS: Current Outpatient Prescriptions  Medication Sig Dispense Refill  . acetaminophen (TYLENOL) 325 MG tablet Take 650 mg by mouth as needed.      . capecitabine (XELODA) 500 MG tablet Take 3 tablets by mouth two times daily Monday through  Friday during radiation for 5 &amp; 1/2 weeks as directed  168 tablet  1  . hydrocortisone (ANUSOL-HC) 25 MG suppository Place 1 suppository (25 mg total) rectally daily.  24 suppository  1  . ibuprofen (ADVIL,MOTRIN) 200 MG tablet Take 200 mg by mouth every 6 (six) hours as needed.      . Loperamide HCl (IMODIUM A-D PO) Take 2 mg by mouth as needed.      . ondansetron (ZOFRAN) 8 MG tablet Take 1 tablet (8 mg total) by mouth every 8 (eight) hours as needed for nausea or vomiting.  30 tablet  0  . pseudoephedrine (SUDAFED) 30 MG tablet Take 30 mg by mouth every 4 (four) hours as needed for congestion.      . simethicone (MYLICON) 678 MG chewable tablet Chew 125 mg by mouth every 6 (six) hours as needed for flatulence (gel pill OTC).       No current facility-administered medications for this encounter.     ALLERGIES: Zithromax and Compazine   LABORATORY DATA:  Lab Results  Component Value Date   WBC 6.6 03/15/2014   HGB 15.5 03/15/2014   HCT 44.9 03/15/2014   MCV 93.3 03/15/2014   PLT 104* 03/15/2014   Lab Results  Component Value Date   NA 142 03/15/2014   K 3.6 03/15/2014   CL 103 02/01/2014   CO2 28 03/15/2014   Lab Results  Component Value Date   ALT 14 03/15/2014   AST 12 03/15/2014   ALKPHOS 98 03/15/2014   BILITOT 1.04 03/15/2014     NARRATIVE: Seth Villanueva. was seen today for weekly treatment management. The chart was checked and the patient's films were reviewed. The patient was seen today  with him complaining of some increased pain in the rectal/pelvic region. He is notice some blood after bowel movements when he wipes. He has been taking some over-the-counter pain medicine without sufficient relief.  PHYSICAL EXAMINATION: weight is 157 lb 11.2 oz (71.532 kg). His oral temperature is 98 F (36.7 C). His blood pressure is 118/81 and his pulse is 80. His respiration is 12 and oxygen saturation is 100%.        ASSESSMENT: The patient is doing satisfactorily with treatment.  PLAN: We will continue with the patient's radiation treatment as planned. The patient was given a prescription for Percocet and I discussed using a stool softener. The patient is quite concerned about constipation - we discussed this in detail. The patient has 2 more boost treatments remaining and overall I believe he has done well. He will let us know if he has increased pain or bleeding. I will see him in one month.

## 2014-03-26 NOTE — Progress Notes (Signed)
He rates his pain as a 8 on a scale of 0-10. When having bowel movements or after a shower.  Was wondering if it was possibly due to soap he is using.  Continues to do sitz baths and use wipes.   Pt complains of, Fatigue, Generalized Weakness, Pain is burning and pulsating and Pain Occurs  Intermittently. Diarrhea occasionally. The patient eats a regular, healthy diet. Pt reports he eats chicken, steak, bananas, potatoes, drinking plenty of water. Pt reports he has redness over the treatment area.

## 2014-03-27 ENCOUNTER — Ambulatory Visit: Payer: 59

## 2014-03-27 ENCOUNTER — Ambulatory Visit
Admission: RE | Admit: 2014-03-27 | Discharge: 2014-03-27 | Disposition: A | Discharge status: Home / Self Care | Payer: 59 | Source: Ambulatory Visit | Attending: Radiation Oncology | Admitting: Radiation Oncology

## 2014-03-27 DIAGNOSIS — Z510 Encounter for antineoplastic radiation therapy: Secondary | ICD-10-CM | POA: Diagnosis not present

## 2014-03-28 ENCOUNTER — Encounter: Payer: Self-pay | Admitting: Radiation Oncology

## 2014-03-28 ENCOUNTER — Ambulatory Visit: Admission: RE | Admit: 2014-03-28 | Payer: 59 | Source: Ambulatory Visit | Admitting: Radiation Oncology

## 2014-03-28 ENCOUNTER — Ambulatory Visit
Admission: RE | Admit: 2014-03-28 | Discharge: 2014-03-28 | Disposition: A | Discharge status: Home / Self Care | Payer: 59 | Source: Ambulatory Visit | Attending: Radiation Oncology | Admitting: Radiation Oncology

## 2014-03-28 ENCOUNTER — Ambulatory Visit: Payer: 59

## 2014-03-28 DIAGNOSIS — Z510 Encounter for antineoplastic radiation therapy: Secondary | ICD-10-CM | POA: Diagnosis not present

## 2014-04-03 ENCOUNTER — Other Ambulatory Visit: Payer: Self-pay | Admitting: Oncology

## 2014-04-05 NOTE — Progress Notes (Signed)
  Radiation Oncology         [phone]) [phone] ________________________________  Name: Seth Villanueva. MRN: [id number]  Date: 02/19/2014  DOB: 1980-09-17  Simulation Verification Note   NARRATIVE: The patient was brought to the treatment unit and placed in the planned treatment position. The clinical setup was verified. Then port films were obtained and uploaded to the radiation oncology medical record software.  The treatment beams were carefully compared against the planned radiation fields. The position, location, and shape of the radiation fields was reviewed. The targeted volume of tissue appears to be appropriately covered by the radiation beams. Based on my personal review, I approved the simulation verification. The patient's treatment will proceed as planned.  ________________________________   Jodelle Gross, MD, PhD

## 2014-04-05 NOTE — Addendum Note (Signed)
Encounter addended by: Marye Round, MD on: 04/05/2014  9:33 AM<BR>     Documentation filed: Notes Section, Visit Diagnoses

## 2014-04-05 NOTE — Addendum Note (Signed)
Encounter addended by: Marye Round, MD on: 04/05/2014  9:34 AM<BR>     Documentation filed: Notes Section

## 2014-04-05 NOTE — Progress Notes (Signed)
  Radiation Oncology         [phone]) [phone] ________________________________  Name: Seth Villanueva. MRN: [id number]  Date: 03/09/2014  DOB: 1980/08/14  COMPLEX SIMULATION  NOTE  Diagnosis: rectal cancer  Narrative The patient has initially been planned to receive a course of radiation treatment to a dose of 45 gray in 25 fractions at 1.8 gray per fraction. The patient will now receive a boost to the high risk target volume for an additional 5.4 gray. This will be delivered in 3 fractions at 1.8 gray per fraction and a cone down boost technique will be utilized. To accomplish this, an additional 3 customized blocks have been designed for this purpose. A complex isodose plan is requested to ensure that the high-risk target region receives the appropriate radiation dose and that the nearby normal structures continue to be appropriately spared. The patient's final total dose therefore will be 50.4 gray.   ________________________________ ------------------------------------------------  Jodelle Gross, MD, PhD

## 2014-04-05 NOTE — Progress Notes (Signed)
  Radiation Oncology         [phone]) [phone] ________________________________  Name: Seth Villanueva. MRN: [id number]  Date: 03/28/2014  DOB: [birth year]  End of Treatment Note  Diagnosis:   Rectal cancer     Indication for treatment:  curative       Radiation treatment dates:   02/19/14 - 03/28/14  Site/dose:    The patient was treated to the pelvis including the gross tumor volume and at risk nodal volumes to a dose of 45 Gy at 1.8 Gy per fraction. This was accomplished using a 4 field 3-D conformal technique. The patient then received a boost to the tumor and adjacent high-risk regions for an additional 5.4 Gy at 1.8 gray per fraction. This was carried out using a coned-down 4 field approach. The patient's total dose was 50.4 Gy.  Narrative: The patient tolerated radiation treatment relatively well.   The patient experienced skin irritation in the anal region as expected.  Plan: The patient has completed radiation treatment. The patient will return to radiation oncology clinic for routine followup in one month. I advised the patient to call or return sooner if they have any questions or concerns related to their recovery or treatment. ________________________________  Jodelle Gross, M.D., Ph.D.

## 2014-04-05 NOTE — Progress Notes (Signed)
  Radiation Oncology         [phone]) [phone] ________________________________  Name: Seth Villanueva. MRN: [id number]  Date: 02/12/2014  DOB: Jan 03, 1981  Optical Surface Tracking Plan:  Since intensity modulated radiotherapy (IMRT) and 3D conformal radiation treatment methods are predicated on accurate and precise positioning for treatment, intrafraction motion monitoring is medically necessary to ensure accurate and safe treatment delivery.  The ability to quantify intrafraction motion without excessive ionizing radiation dose can only be performed with optical surface tracking. Accordingly, surface imaging offers the opportunity to obtain 3D measurements of patient position throughout IMRT and 3D treatments without excessive radiation exposure.  I am ordering optical surface tracking for this patient's upcoming course of radiotherapy. ________________________________  Marye Round, MD 04/05/2014 9:34 AM    Reference:   Ursula Alert, J, et al. Surface imaging-based analysis of intrafraction motion for breast radiotherapy patients.Journal of [place], n. 6, nov. 2014. ISSN [id number].   Available at: <http://www.jacmp.org/index.php/jacmp/article/view/4957>.

## 2014-04-11 ENCOUNTER — Telehealth: Payer: Self-pay | Admitting: Oncology

## 2014-04-11 ENCOUNTER — Ambulatory Visit (HOSPITAL_BASED_OUTPATIENT_CLINIC_OR_DEPARTMENT_OTHER): Payer: 59 | Admitting: Nurse Practitioner

## 2014-04-11 ENCOUNTER — Other Ambulatory Visit (HOSPITAL_BASED_OUTPATIENT_CLINIC_OR_DEPARTMENT_OTHER): Payer: 59

## 2014-04-11 VITALS — BP 140/87 | HR 79 | Temp 98.2°F | Resp 19 | Ht 73.0 in | Wt 156.4 lb

## 2014-04-11 DIAGNOSIS — C20 Malignant neoplasm of rectum: Secondary | ICD-10-CM

## 2014-04-11 DIAGNOSIS — D6959 Other secondary thrombocytopenia: Secondary | ICD-10-CM

## 2014-04-11 LAB — CBC WITH DIFFERENTIAL/PLATELET
BASO%: 1 % (ref 0.0–2.0)
Basophils Absolute: 0.1 10*3/uL (ref 0.0–0.1)
EOS ABS: 0.7 10*3/uL — AB (ref 0.0–0.5)
EOS%: 10.9 % — ABNORMAL HIGH (ref 0.0–7.0)
HCT: 44.6 % (ref 38.4–49.9)
HGB: 15.4 g/dL (ref 13.0–17.1)
LYMPH%: 8 % — ABNORMAL LOW (ref 14.0–49.0)
MCH: 33.2 pg (ref 27.2–33.4)
MCHC: 34.4 g/dL (ref 32.0–36.0)
MCV: 96.4 fL (ref 79.3–98.0)
MONO#: 0.6 10*3/uL (ref 0.1–0.9)
MONO%: 9.9 % (ref 0.0–14.0)
NEUT%: 70.2 % (ref 39.0–75.0)
NEUTROS ABS: 4.2 10*3/uL (ref 1.5–6.5)
PLATELETS: 134 10*3/uL — AB (ref 140–400)
RBC: 4.62 10*6/uL (ref 4.20–5.82)
RDW: 19.1 % — AB (ref 11.0–14.6)
WBC: 6 10*3/uL (ref 4.0–10.3)
lymph#: 0.5 10*3/uL — ABNORMAL LOW (ref 0.9–3.3)

## 2014-04-11 NOTE — Telephone Encounter (Signed)
Pt confirmed labs/ov per 09/02 POF, gave pt AVS...KJ °

## 2014-04-11 NOTE — Progress Notes (Signed)
  [facility] OFFICE PROGRESS NOTE   Diagnosis:  Rectal cancer.  INTERVAL HISTORY:   Mr. Bordelon returns as scheduled. He completed Xeloda and radiation on 03/28/2014. He denies any mouth sores. No hand or foot pain or redness. He continues to have pain with bowel movements. He has intermittent rectal bleeding. He noted increased rectal bleeding after increasing his activity. He takes pain medication and suppositories as needed.  Objective:  Vital signs in last 24 hours:  Blood pressure 140/87, pulse 79, temperature 98.2 F (36.8 C), temperature source Oral, resp. rate 19, height 6\' 1"  (1.854 m), weight 156 lb 6.4 oz (70.943 kg), SpO2 100.00%.    HEENT: No thrush or ulcers. Resp: Lungs clear bilaterally. Cardio: Regular rate and rhythm. GI: Abdomen soft and nontender. No hepatomegaly. Vascular: No leg edema. Skin: Previous skin breakdown at the anal verge and perineum is healing. Palms are dry.    Lab Results:  Lab Results  Component Value Date   WBC 6.0 04/11/2014   HGB 15.4 04/11/2014   HCT 44.6 04/11/2014   MCV 96.4 04/11/2014   PLT 134* 04/11/2014   NEUTROABS 4.2 04/11/2014    Imaging:  No results found.  Medications: I have reviewed the patient's current medications.  Assessment/Plan: 1. Rectal cancer, clinical stage III, distal rectal mass-approximate 2 cm from the anal verge, status post an endoscopic biopsy 01/30/2014 confirming an invasive adenocarcinoma CTs of the chest, abdomen, and pelvis with no evidence of metastatic disease, malignant-appearing perirectal lymph nodes on the abdomen/pelvis CT 01/29/2014  EUS 01/30/2014 confirmed a uT3,uN2 lesion  Initiation of radiation and concurrent Xeloda 02/19/2014. Completion of radiation and concurrent Xeloda 03/28/2014. 2. Rectal pain secondary to #1. 3. Mild thrombocytopenia secondary to Xeloda and radiation. Improved. 4. Radiation skin breakdown at the perineum. Improved.   Disposition: Seth Villanueva appears  stable. He has completed neoadjuvant radiation/Xeloda. He has a followup appointment with Dr. Marcello Moores on 04/25/2014. We scheduled a followup appointment here on 05/28/2014 and can adjust accordingly pending the date of surgery.  He will contact the office prior to his next visit with any problems.  Plan reviewed with Dr. Benay Spice.    Ned Card ANP/GNP-BC   04/11/2014  11:25 AM

## 2014-04-13 ENCOUNTER — Telehealth: Payer: Self-pay | Admitting: *Deleted

## 2014-04-13 MED ORDER — OXYCODONE HCL 5 MG PO TABS: 5.0000 mg | ORAL_TABLET | ORAL | Status: DC | PRN

## 2014-04-13 MED ORDER — POLYETHYLENE GLYCOL 3350 17 G PO PACK: 17.0000 g | PACK | Freq: Every day | ORAL | Status: DC

## 2014-04-13 NOTE — Telephone Encounter (Signed)
Message from pt reporting pain with BM that lasted approximately 8 hours yesterday. Requesting stronger pain med. Returned call, he is taking 1-2 Percocet with bowel movements. Has some bleeding with large BM. Reports he is taking Colace 100 mg BID. Reviewed with Lattie Haw, NP: Order received to increase Colace to 200 mg BID, add Miralax daily and change pain med to Oxycodone 5 mg 1-3 tablets Q4 hours PRN. Called pt with instructions above. He voiced understanding.

## 2014-04-13 NOTE — Addendum Note (Signed)
Addended by: Brien Few on: 04/13/2014 03:31 PM   Modules accepted: Orders

## 2014-04-25 ENCOUNTER — Other Ambulatory Visit (INDEPENDENT_AMBULATORY_CARE_PROVIDER_SITE_OTHER): Payer: Self-pay | Admitting: General Surgery

## 2014-04-25 ENCOUNTER — Encounter (INDEPENDENT_AMBULATORY_CARE_PROVIDER_SITE_OTHER): Payer: 59 | Admitting: General Surgery

## 2014-04-30 ENCOUNTER — Encounter: Payer: Self-pay | Admitting: Radiation Oncology

## 2014-05-03 ENCOUNTER — Ambulatory Visit
Admission: RE | Admit: 2014-05-03 | Discharge: 2014-05-03 | Disposition: A | Discharge status: Home / Self Care | Payer: 59 | Source: Ambulatory Visit | Attending: Radiation Oncology | Admitting: Radiation Oncology

## 2014-05-03 ENCOUNTER — Encounter: Payer: Self-pay | Admitting: Radiation Oncology

## 2014-05-03 VITALS — BP 133/72 | HR 83 | Temp 98.7°F | Resp 16 | Ht 73.0 in | Wt 164.6 lb

## 2014-05-03 DIAGNOSIS — C20 Malignant neoplasm of rectum: Secondary | ICD-10-CM

## 2014-05-03 HISTORY — DX: Personal history of irradiation: Z92.3

## 2014-05-03 NOTE — Progress Notes (Signed)
  Radiation Oncology         [phone]) [phone] ________________________________  Name: Seth Villanueva. MRN: [id number]  Date: 05/03/2014  DOB: 30-Oct-1980  Follow-Up Visit Note  CC: Leonides Sake, MD  Gayland Curry, MD  Diagnosis:   Rectal cancer  Interval Since Last Radiation:  Approximately one month   Narrative:  The patient returns today for routine follow-up.  The patient states that things have gone well since the end of treatment in terms of recovery. No significant ongoing  issues in terms of dysuria, rectal irritation. No blood per rectum.                         ALLERGIES:  is allergic to zithromax and compazine.  Meds: Current Outpatient Prescriptions  Medication Sig Dispense Refill  . acetaminophen (TYLENOL) 325 MG tablet Take 650 mg by mouth as needed.      . hydrocortisone (ANUSOL-HC) 25 MG suppository PLACE 1 SUPPOSITORY (25 MG TOTAL) RECTALLY DAILY.  24 suppository  1  . ibuprofen (ADVIL,MOTRIN) 200 MG tablet Take 200 mg by mouth every 6 (six) hours as needed.      . Loperamide HCl (IMODIUM A-D PO) Take 2 mg by mouth as needed.      . ondansetron (ZOFRAN) 8 MG tablet Take 1 tablet (8 mg total) by mouth every 8 (eight) hours as needed for nausea or vomiting.  30 tablet  0  . oxyCODONE (OXY IR/ROXICODONE) 5 MG immediate release tablet Take 1-3 tablets (5-15 mg total) by mouth every 4 (four) hours as needed for severe pain.  50 tablet  0  . polyethylene glycol (MIRALAX) packet Take 17 g by mouth daily.  28 packet  2  . pseudoephedrine (SUDAFED) 30 MG tablet Take 30 mg by mouth every 4 (four) hours as needed for congestion.      . simethicone (MYLICON) 937 MG chewable tablet Chew 125 mg by mouth every 6 (six) hours as needed for flatulence (gel pill OTC).       No current facility-administered medications for this encounter.    Physical Findings: The patient is in no acute distress. Patient is alert and oriented.  height is 6\' 1"  (1.854 m) and weight is 164 lb 9.6 oz  (74.662 kg). His oral temperature is 98.7 F (37.1 C). His blood pressure is 133/72 and his pulse is 83. His respiration is 16. .      Lab Findings: Lab Results  Component Value Date   WBC 6.0 04/11/2014   HGB 15.4 04/11/2014   HCT 44.6 04/11/2014   MCV 96.4 04/11/2014   PLT 134* 04/11/2014     Radiographic Findings: No results found.  Impression:    The patient is doing well approximately one month after completing preoperative chemoradiotherapy for rectal cancer. Surgery is scheduled on 05/18/2014.   Plan:  The patient will followup in 6 months.   Jodelle Gross, M.D., Ph.D.

## 2014-05-03 NOTE — Progress Notes (Signed)
Follow up s/p rad txs 02/19/14-03/28/14 rectal ca, patient doing a lot better, appetite good , regular bowel movements,occasional burning with bm's, uses suppository prn,  Surgery scheduled 05/18/14 , back to work full time and plays 18 holes  discgolf Takes pain med prn ,has gained weight appetite great 4:27 PM  4:26 PM

## 2014-05-07 ENCOUNTER — Encounter (HOSPITAL_COMMUNITY): Payer: Self-pay | Admitting: Pharmacy Technician

## 2014-05-09 ENCOUNTER — Encounter (HOSPITAL_COMMUNITY)
Admission: RE | Admit: 2014-05-09 | Discharge: 2014-05-09 | Disposition: A | Discharge status: Home / Self Care | Payer: 59 | Source: Ambulatory Visit | Attending: General Surgery | Admitting: General Surgery

## 2014-05-09 ENCOUNTER — Encounter (HOSPITAL_COMMUNITY): Payer: Self-pay

## 2014-05-09 DIAGNOSIS — C20 Malignant neoplasm of rectum: Secondary | ICD-10-CM | POA: Insufficient documentation

## 2014-05-09 DIAGNOSIS — Z01812 Encounter for preprocedural laboratory examination: Secondary | ICD-10-CM | POA: Insufficient documentation

## 2014-05-09 DIAGNOSIS — Z01810 Encounter for preprocedural cardiovascular examination: Secondary | ICD-10-CM | POA: Insufficient documentation

## 2014-05-09 LAB — BASIC METABOLIC PANEL
Anion gap: 11 (ref 5–15)
BUN: 16 mg/dL (ref 6–23)
CALCIUM: 9.5 mg/dL (ref 8.4–10.5)
CO2: 27 mEq/L (ref 19–32)
Chloride: 104 mEq/L (ref 96–112)
Creatinine, Ser: 0.94 mg/dL (ref 0.50–1.35)
Glucose, Bld: 101 mg/dL — ABNORMAL HIGH (ref 70–99)
Potassium: 4.3 mEq/L (ref 3.7–5.3)
Sodium: 142 mEq/L (ref 137–147)

## 2014-05-09 LAB — CBC
HCT: 44.5 % (ref 39.0–52.0)
Hemoglobin: 15.6 g/dL (ref 13.0–17.0)
MCH: 33.1 pg (ref 26.0–34.0)
MCHC: 35.1 g/dL (ref 30.0–36.0)
MCV: 94.3 fL (ref 78.0–100.0)
PLATELETS: 165 10*3/uL (ref 150–400)
RBC: 4.72 MIL/uL (ref 4.22–5.81)
RDW: 13.8 % (ref 11.5–15.5)
WBC: 5.3 10*3/uL (ref 4.0–10.5)

## 2014-05-09 LAB — HEMOGLOBIN A1C
HEMOGLOBIN A1C: 4.9 % (ref <5.7)
MEAN PLASMA GLUCOSE: 94 mg/dL (ref <117)

## 2014-05-09 NOTE — Patient Instructions (Addendum)
[place].  05/09/2014   Your procedure is scheduled on:       05/18/2014  Report to [facility] Main Entrance and follow signs to  [place] arrive at [place] AM.              Call this number if you have problems the morning of surgery [phone] or Presurgical Testing [phone].   Remember:  Do not eat food or drink liquids :After Midnight.  For Living Will and/or Health Care Power Attorney Forms: please provide copy for your medical record, may bring AM of surgery (forms should be already notarized-we do not provide this service).  Remember: follow any bowel prep instructions per MD office.     Take these medicines the morning of surgery with A SIP OF WATER: Oxycodone if needed;Zofran if needed                               You may not have any metal on your body including hair pins and piercings  Do not wear jewelry, make-up, lotions, powders, or deodorant.             Men may shave face and neck.               Do not bring valuables to the hospital. [place].  Contacts, dentures or bridgework may not be worn into surgery.  Leave suitcase in the car. After surgery it may be brought to your room.  For patients admitted to the hospital, checkout time is 11:00 AM the day of discharge.   ________________________________________________________________________  [facility] - Preparing for Surgery Before surgery, you can play an important role.  Because skin is not sterile, your skin needs to be as free of germs as possible.  You can reduce the number of germs on your skin by washing with CHG (chlorahexidine gluconate) soap before surgery.  CHG is an antiseptic cleaner which kills germs and bonds with the skin to continue killing germs even after washing. Please DO NOT use if you have an allergy to CHG or antibacterial soaps.  If your skin becomes reddened/irritated stop using the CHG and inform your nurse when you arrive  at Short Stay. Do not shave (including legs and underarms) for at least 48 hours prior to the first CHG shower.  You may shave your face/neck. Please follow these instructions carefully:  1.  Shower with CHG Soap the night before surgery and the  morning of Surgery.  2.  If you choose to wash your hair, wash your hair first as usual with your  normal  shampoo.  3.  After you shampoo, rinse your hair and body thoroughly to remove the  shampoo.                           4.  Use CHG as you would any other liquid soap.  You can apply chg directly  to the skin and wash                       Gently with a scrungie or clean washcloth.  5.  Apply the CHG Soap to your body ONLY FROM THE NECK DOWN.   Do not use on face/ open  Wound or open sores. Avoid contact with eyes, ears mouth and genitals (private parts).                       Wash face,  Genitals (private parts) with your normal soap.             6.  Wash thoroughly, paying special attention to the area where your surgery  will be performed.  7.  Thoroughly rinse your body with warm water from the neck down.  8.  DO NOT shower/wash with your normal soap after using and rinsing off  the CHG Soap.                9.  Pat yourself dry with a clean towel.            10.  Wear clean pajamas.            11.  Place clean sheets on your bed the night of your first shower and do not  sleep with pets. Day of Surgery : Do not apply any lotions/deodorants the morning of surgery.  Please wear clean clothes to the hospital/surgery center.  FAILURE TO FOLLOW THESE INSTRUCTIONS MAY RESULT IN THE CANCELLATION OF YOUR SURGERY PATIENT SIGNATURE_________________________________  NURSE SIGNATURE__________________________________  ________________________________________________________________________

## 2014-05-09 NOTE — Consult Note (Addendum)
WOC consult requested for presurgical stoma site marking.   Assessed patient while standing, sitting, and moving.  Mark placed within rectus muscles, in line of vision, to area free from folds.  Mark placed in left lower quad; 2 cm below umbilicus and 5 cm to left, and also to right lower quad; 2 cm below umbilicus and 5 cm to right.  There are creases located above and below these areas that occur when patient sits forward which should be avoided if possible.  Instructed patient to fill in site markings if they begin to fade; marking pen given to him to take home and also educational materials.  Demonstrated pouch appearance and briefly discussed pouching routines.  He denies further questions at this time.  [place] team will follow post-op for further educational sessions. Julien Girt MSN, RN, [place], [place], [place]

## 2014-05-10 NOTE — Progress Notes (Signed)
EKG results per PAT visit on 05/09/2014 per EPIC sent to pts PCP Dr Daiva Eves and surgeon Dr [name]

## 2014-05-18 ENCOUNTER — Inpatient Hospital Stay (HOSPITAL_COMMUNITY)
Admission: RE | Admit: 2014-05-18 | Discharge: 2014-05-27 | DRG: 330 | Disposition: A | Discharge status: Home Health | Payer: 59 | Source: Ambulatory Visit | Attending: General Surgery | Admitting: General Surgery

## 2014-05-18 ENCOUNTER — Encounter (HOSPITAL_COMMUNITY): Payer: Self-pay | Admitting: *Deleted

## 2014-05-18 ENCOUNTER — Encounter (HOSPITAL_COMMUNITY): Payer: 59 | Admitting: Anesthesiology

## 2014-05-18 ENCOUNTER — Encounter (HOSPITAL_COMMUNITY)
Admission: RE | Disposition: A | Discharge status: Home Health | Payer: Self-pay | Source: Ambulatory Visit | Attending: General Surgery

## 2014-05-18 ENCOUNTER — Inpatient Hospital Stay (HOSPITAL_COMMUNITY): Payer: 59 | Admitting: Anesthesiology

## 2014-05-18 DIAGNOSIS — Z9221 Personal history of antineoplastic chemotherapy: Secondary | ICD-10-CM | POA: Diagnosis not present

## 2014-05-18 DIAGNOSIS — E860 Dehydration: Secondary | ICD-10-CM | POA: Diagnosis present

## 2014-05-18 DIAGNOSIS — C20 Malignant neoplasm of rectum: Principal | ICD-10-CM | POA: Diagnosis present

## 2014-05-18 DIAGNOSIS — K910 Vomiting following gastrointestinal surgery: Secondary | ICD-10-CM

## 2014-05-18 DIAGNOSIS — C779 Secondary and unspecified malignant neoplasm of lymph node, unspecified: Secondary | ICD-10-CM | POA: Diagnosis present

## 2014-05-18 DIAGNOSIS — Z923 Personal history of irradiation: Secondary | ICD-10-CM

## 2014-05-18 DIAGNOSIS — Z87891 Personal history of nicotine dependence: Secondary | ICD-10-CM

## 2014-05-18 DIAGNOSIS — Z01812 Encounter for preprocedural laboratory examination: Secondary | ICD-10-CM

## 2014-05-18 HISTORY — PX: OTHER SURGICAL HISTORY: SHX169

## 2014-05-18 LAB — ABO/RH: ABO/RH(D): B POS

## 2014-05-18 LAB — TYPE AND SCREEN
ABO/RH(D): B POS
Antibody Screen: NEGATIVE

## 2014-05-18 SURGERY — COLECTOMY, PARTIAL, ROBOT-ASSISTED, LAPAROSCOPIC
Anesthesia: General | Site: Abdomen

## 2014-05-18 MED ORDER — PROMETHAZINE HCL 25 MG/ML IJ SOLN: 6.2500 mg | INTRAMUSCULAR | Status: DC | PRN

## 2014-05-18 MED ORDER — GLYCOPYRROLATE 0.2 MG/ML IJ SOLN
INTRAMUSCULAR | Status: DC | PRN
  Administered 2014-05-18: .6 mg via INTRAVENOUS

## 2014-05-18 MED ORDER — LACTATED RINGERS IR SOLN
Status: DC | PRN
  Administered 2014-05-18: 1000 mL

## 2014-05-18 MED ORDER — LACTATED RINGERS IV SOLN
INTRAVENOUS | Status: DC | PRN
  Administered 2014-05-18 (×4): via INTRAVENOUS

## 2014-05-18 MED ORDER — OXYCODONE HCL 5 MG/5ML PO SOLN: 5.0000 mg | Freq: Once | ORAL | Status: DC | PRN

## 2014-05-18 MED ORDER — ROCURONIUM BROMIDE 100 MG/10ML IV SOLN
INTRAVENOUS | Status: AC
  Filled 2014-05-18: qty 1

## 2014-05-18 MED ORDER — GLYCOPYRROLATE 0.2 MG/ML IJ SOLN
INTRAMUSCULAR | Status: AC
  Filled 2014-05-18: qty 3

## 2014-05-18 MED ORDER — FENTANYL CITRATE 0.05 MG/ML IJ SOLN
INTRAMUSCULAR | Status: AC
  Filled 2014-05-18: qty 5

## 2014-05-18 MED ORDER — CHLORHEXIDINE GLUCONATE CLOTH 2 % EX PADS
6.0000 | MEDICATED_PAD | Freq: Once | CUTANEOUS | Status: AC
  Administered 2014-05-18: 6 via TOPICAL

## 2014-05-18 MED ORDER — LIP MEDEX EX OINT
TOPICAL_OINTMENT | CUTANEOUS | Status: AC
  Administered 2014-05-18: 17:00:00
  Filled 2014-05-18: qty 7

## 2014-05-18 MED ORDER — NEOSTIGMINE METHYLSULFATE 10 MG/10ML IV SOLN
INTRAVENOUS | Status: DC | PRN
  Administered 2014-05-18: 4 mg via INTRAVENOUS

## 2014-05-18 MED ORDER — HYDROMORPHONE HCL 1 MG/ML IJ SOLN
INTRAMUSCULAR | Status: DC | PRN
  Administered 2014-05-18 (×3): 0.5 mg via INTRAVENOUS
  Administered 2014-05-18: 1 mg via INTRAVENOUS
  Administered 2014-05-18: 0.5 mg via INTRAVENOUS
  Administered 2014-05-18: 1 mg via INTRAVENOUS

## 2014-05-18 MED ORDER — CEFOTETAN DISODIUM 2 G IJ SOLR
2.0000 g | Freq: Two times a day (BID) | INTRAMUSCULAR | Status: AC
  Administered 2014-05-18: 2 g via INTRAVENOUS
  Filled 2014-05-18: qty 2

## 2014-05-18 MED ORDER — HEPARIN SODIUM (PORCINE) 5000 UNIT/ML IJ SOLN
5000.0000 [IU] | Freq: Once | INTRAMUSCULAR | Status: AC
  Administered 2014-05-18: 5000 [IU] via SUBCUTANEOUS
  Filled 2014-05-18: qty 1

## 2014-05-18 MED ORDER — BUPIVACAINE-EPINEPHRINE (PF) 0.25% -1:200000 IJ SOLN
INTRAMUSCULAR | Status: AC
  Filled 2014-05-18: qty 30

## 2014-05-18 MED ORDER — LIDOCAINE HCL (CARDIAC) 20 MG/ML IV SOLN
INTRAVENOUS | Status: AC
  Filled 2014-05-18: qty 5

## 2014-05-18 MED ORDER — CHLORHEXIDINE GLUCONATE 0.12 % MT SOLN
15.0000 mL | Freq: Two times a day (BID) | OROMUCOSAL | Status: DC
  Administered 2014-05-19 – 2014-05-22 (×6): 15 mL via OROMUCOSAL
  Filled 2014-05-18 (×10): qty 15

## 2014-05-18 MED ORDER — DEXTROSE 5 % IV SOLN
2.0000 g | INTRAVENOUS | Status: AC
  Administered 2014-05-18: 2 g via INTRAVENOUS

## 2014-05-18 MED ORDER — MEPERIDINE HCL 50 MG/ML IJ SOLN: 6.2500 mg | INTRAMUSCULAR | Status: DC | PRN

## 2014-05-18 MED ORDER — HYDROMORPHONE HCL 2 MG/ML IJ SOLN
INTRAMUSCULAR | Status: AC
  Filled 2014-05-18: qty 1

## 2014-05-18 MED ORDER — MIDAZOLAM HCL 5 MG/5ML IJ SOLN
INTRAMUSCULAR | Status: DC | PRN
  Administered 2014-05-18: 2 mg via INTRAVENOUS

## 2014-05-18 MED ORDER — DIPHENHYDRAMINE HCL 12.5 MG/5ML PO ELIX: 12.5000 mg | ORAL_SOLUTION | Freq: Four times a day (QID) | ORAL | Status: DC | PRN

## 2014-05-18 MED ORDER — PROPOFOL 10 MG/ML IV BOLUS
INTRAVENOUS | Status: DC | PRN
  Administered 2014-05-18: 150 mg via INTRAVENOUS

## 2014-05-18 MED ORDER — LIDOCAINE HCL (CARDIAC) 20 MG/ML IV SOLN
INTRAVENOUS | Status: DC | PRN
  Administered 2014-05-18: 50 mg via INTRAVENOUS

## 2014-05-18 MED ORDER — PROPOFOL 10 MG/ML IV BOLUS
INTRAVENOUS | Status: AC
  Filled 2014-05-18: qty 20

## 2014-05-18 MED ORDER — DEXAMETHASONE SODIUM PHOSPHATE 10 MG/ML IJ SOLN
INTRAMUSCULAR | Status: AC
  Filled 2014-05-18: qty 1

## 2014-05-18 MED ORDER — ACETAMINOPHEN 500 MG PO TABS
1000.0000 mg | ORAL_TABLET | Freq: Four times a day (QID) | ORAL | Status: AC
  Administered 2014-05-18 – 2014-05-19 (×3): 1000 mg via ORAL
  Filled 2014-05-18 (×3): qty 2

## 2014-05-18 MED ORDER — CEFOTETAN DISODIUM-DEXTROSE 2-2.08 GM-% IV SOLR
INTRAVENOUS | Status: AC
  Filled 2014-05-18: qty 50

## 2014-05-18 MED ORDER — DIPHENHYDRAMINE HCL 50 MG/ML IJ SOLN
12.5000 mg | Freq: Four times a day (QID) | INTRAMUSCULAR | Status: DC | PRN
Start: 2014-05-18 — End: 2014-05-27

## 2014-05-18 MED ORDER — FENTANYL CITRATE 0.05 MG/ML IJ SOLN
INTRAMUSCULAR | Status: AC
  Filled 2014-05-18: qty 2

## 2014-05-18 MED ORDER — NALOXONE HCL 0.4 MG/ML IJ SOLN: 0.4000 mg | INTRAMUSCULAR | Status: DC | PRN

## 2014-05-18 MED ORDER — MIDAZOLAM HCL 2 MG/2ML IJ SOLN
INTRAMUSCULAR | Status: AC
  Filled 2014-05-18: qty 2

## 2014-05-18 MED ORDER — ONDANSETRON HCL 4 MG/2ML IJ SOLN
4.0000 mg | Freq: Four times a day (QID) | INTRAMUSCULAR | Status: DC | PRN
  Administered 2014-05-21 – 2014-05-27 (×13): 4 mg via INTRAVENOUS
  Filled 2014-05-18 (×13): qty 2

## 2014-05-18 MED ORDER — LACTATED RINGERS IV SOLN
INTRAVENOUS | Status: DC
  Administered 2014-05-18 – 2014-05-19 (×3): via INTRAVENOUS

## 2014-05-18 MED ORDER — BUPIVACAINE-EPINEPHRINE (PF) 0.25% -1:200000 IJ SOLN
INTRAMUSCULAR | Status: DC | PRN
  Administered 2014-05-18: 26 mL

## 2014-05-18 MED ORDER — SODIUM CHLORIDE 0.9 % IJ SOLN: 9.0000 mL | INTRAMUSCULAR | Status: DC | PRN

## 2014-05-18 MED ORDER — ONDANSETRON HCL 4 MG/2ML IJ SOLN
INTRAMUSCULAR | Status: DC | PRN
  Administered 2014-05-18: 4 mg via INTRAVENOUS

## 2014-05-18 MED ORDER — ALVIMOPAN 12 MG PO CAPS
12.0000 mg | ORAL_CAPSULE | Freq: Once | ORAL | Status: AC
  Administered 2014-05-18: 12 mg via ORAL
  Filled 2014-05-18: qty 1

## 2014-05-18 MED ORDER — 0.9 % SODIUM CHLORIDE (POUR BTL) OPTIME
TOPICAL | Status: DC | PRN
  Administered 2014-05-18: 2000 mL

## 2014-05-18 MED ORDER — MORPHINE SULFATE (PF) 1 MG/ML IV SOLN
INTRAVENOUS | Status: DC
  Administered 2014-05-18: 18 mg via INTRAVENOUS
  Administered 2014-05-18: 15:00:00 via INTRAVENOUS
  Administered 2014-05-18 – 2014-05-19 (×3): 4.5 mg via INTRAVENOUS
  Administered 2014-05-19: 9 mg via INTRAVENOUS
  Administered 2014-05-19: 67.5 mg via INTRAVENOUS
  Administered 2014-05-19: 4.5 mg via INTRAVENOUS
  Administered 2014-05-19: 09:00:00 via INTRAVENOUS
  Administered 2014-05-19: 13.5 mg via INTRAVENOUS
  Administered 2014-05-20: 10.5 mg via INTRAVENOUS
  Administered 2014-05-20 (×2): 3 mg via INTRAVENOUS
  Filled 2014-05-18 (×4): qty 25

## 2014-05-18 MED ORDER — DEXAMETHASONE SODIUM PHOSPHATE 10 MG/ML IJ SOLN
INTRAMUSCULAR | Status: DC | PRN
  Administered 2014-05-18: 10 mg via INTRAVENOUS

## 2014-05-18 MED ORDER — BUPIVACAINE-EPINEPHRINE 0.25% -1:200000 IJ SOLN
INTRAMUSCULAR | Status: DC | PRN
  Administered 2014-05-18: 19 mL

## 2014-05-18 MED ORDER — HYDROMORPHONE HCL 1 MG/ML IJ SOLN
INTRAMUSCULAR | Status: AC
  Filled 2014-05-18: qty 1

## 2014-05-18 MED ORDER — CETYLPYRIDINIUM CHLORIDE 0.05 % MT LIQD
7.0000 mL | Freq: Two times a day (BID) | OROMUCOSAL | Status: DC
  Administered 2014-05-19 (×2): 7 mL via OROMUCOSAL

## 2014-05-18 MED ORDER — HYDROMORPHONE HCL 1 MG/ML IJ SOLN
0.2500 mg | INTRAMUSCULAR | Status: DC | PRN
  Administered 2014-05-18 (×2): 0.5 mg via INTRAVENOUS

## 2014-05-18 MED ORDER — ENOXAPARIN SODIUM 40 MG/0.4ML ~~LOC~~ SOLN
40.0000 mg | SUBCUTANEOUS | Status: DC
  Administered 2014-05-19 – 2014-05-27 (×9): 40 mg via SUBCUTANEOUS
  Filled 2014-05-18 (×10): qty 0.4

## 2014-05-18 MED ORDER — INDOCYANINE GREEN 25 MG IV SOLR
INTRAVENOUS | Status: DC | PRN
  Administered 2014-05-18 (×2): 2.5 mg via INTRAVENOUS

## 2014-05-18 MED ORDER — LACTATED RINGERS IV SOLN: INTRAVENOUS | Status: DC

## 2014-05-18 MED ORDER — ONDANSETRON HCL 4 MG/2ML IJ SOLN
INTRAMUSCULAR | Status: AC
  Filled 2014-05-18: qty 2

## 2014-05-18 MED ORDER — NEOSTIGMINE METHYLSULFATE 10 MG/10ML IV SOLN
INTRAVENOUS | Status: AC
  Filled 2014-05-18: qty 1

## 2014-05-18 MED ORDER — FENTANYL CITRATE 0.05 MG/ML IJ SOLN
INTRAMUSCULAR | Status: DC | PRN
  Administered 2014-05-18 (×2): 100 ug via INTRAVENOUS
  Administered 2014-05-18 (×3): 50 ug via INTRAVENOUS

## 2014-05-18 MED ORDER — ALVIMOPAN 12 MG PO CAPS
12.0000 mg | ORAL_CAPSULE | Freq: Two times a day (BID) | ORAL | Status: DC
  Administered 2014-05-19 (×2): 12 mg via ORAL
  Filled 2014-05-18 (×4): qty 1

## 2014-05-18 MED ORDER — ROCURONIUM BROMIDE 100 MG/10ML IV SOLN
INTRAVENOUS | Status: DC | PRN
  Administered 2014-05-18: 10 mg via INTRAVENOUS
  Administered 2014-05-18: 50 mg via INTRAVENOUS
  Administered 2014-05-18 (×3): 10 mg via INTRAVENOUS

## 2014-05-18 MED ORDER — OXYCODONE HCL 5 MG PO TABS: 5.0000 mg | ORAL_TABLET | Freq: Once | ORAL | Status: DC | PRN

## 2014-05-18 SURGICAL SUPPLY — 108 items
BLADE EXTENDED COATED 6.5IN (ELECTRODE) ×3 IMPLANT
BLADE SURG SZ11 CARB STEEL (BLADE) ×3 IMPLANT
BRIEF STRETCH FOR OB PAD LRG (UNDERPADS AND DIAPERS) ×3 IMPLANT
CANNULA REDUC XI 12-8 STAPL (CANNULA)
CANNULA REDUC XI 12-8MM STAPL (CANNULA)
CANNULA REDUCER 12-8 DVNC XI (CANNULA) IMPLANT
CATH ROBINSON RED A/P 16FR (CATHETERS) ×3 IMPLANT
CELLS DAT CNTRL 66122 CELL SVR (MISCELLANEOUS) IMPLANT
CHLORAPREP W/TINT 26ML (MISCELLANEOUS) ×3 IMPLANT
CLIP LIGATING HEM O LOK PURPLE (MISCELLANEOUS) IMPLANT
CLIP LIGATING HEMOLOK MED (MISCELLANEOUS) IMPLANT
COVER MAYO STAND STRL (DRAPES) ×3 IMPLANT
COVER TIP SHEARS 8 DVNC (MISCELLANEOUS) ×1 IMPLANT
COVER TIP SHEARS 8MM DA VINCI (MISCELLANEOUS) ×2
DECANTER SPIKE VIAL GLASS SM (MISCELLANEOUS) IMPLANT
DERMABOND ADVANCED (GAUZE/BANDAGES/DRESSINGS) ×2
DERMABOND ADVANCED .7 DNX12 (GAUZE/BANDAGES/DRESSINGS) ×1 IMPLANT
DEVICE TROCAR PUNCTURE CLOSURE (ENDOMECHANICALS) ×3 IMPLANT
DRAIN CHANNEL 19F RND (DRAIN) ×3 IMPLANT
DRAPE ARM DVNC X/XI (DISPOSABLE) ×8 IMPLANT
DRAPE COLUMN DVNC XI (DISPOSABLE) ×2 IMPLANT
DRAPE DA VINCI XI ARM (DISPOSABLE) ×16
DRAPE DA VINCI XI COLUMN (DISPOSABLE) ×4
DRAPE SHEET LG 3/4 BI-LAMINATE (DRAPES) ×6 IMPLANT
DRAPE SURG IRRIG POUCH 19X23 (DRAPES) ×3 IMPLANT
DRAPE WARM FLUID 44X44 (DRAPE) ×3 IMPLANT
DRSG OPSITE POSTOP 4X10 (GAUZE/BANDAGES/DRESSINGS) IMPLANT
DRSG OPSITE POSTOP 4X6 (GAUZE/BANDAGES/DRESSINGS) IMPLANT
DRSG OPSITE POSTOP 4X8 (GAUZE/BANDAGES/DRESSINGS) IMPLANT
ELECT BLADE TIP CTD 4 INCH (ELECTRODE) ×3 IMPLANT
ELECT PENCIL ROCKER SW 15FT (MISCELLANEOUS) ×3 IMPLANT
ELECT REM PT RETURN 15FT ADLT (MISCELLANEOUS) ×3 IMPLANT
ENDOLOOP SUT PDS II  0 18 (SUTURE)
ENDOLOOP SUT PDS II 0 18 (SUTURE) IMPLANT
EVACUATOR SILICONE 100CC (DRAIN) ×3 IMPLANT
GAUZE SPONGE 4X4 12PLY STRL (GAUZE/BANDAGES/DRESSINGS) IMPLANT
GLOVE BIO SURGEON STRL SZ 6.5 (GLOVE) ×6 IMPLANT
GLOVE BIO SURGEON STRL SZ7.5 (GLOVE) ×6 IMPLANT
GLOVE BIO SURGEONS STRL SZ 6.5 (GLOVE) ×3
GLOVE BIOGEL PI IND STRL 7.0 (GLOVE) ×9 IMPLANT
GLOVE BIOGEL PI INDICATOR 7.0 (GLOVE) ×18
GLOVE SURG SS PI 6.5 STRL IVOR (GLOVE) ×3 IMPLANT
GOWN STRL REUS W/TWL 2XL LVL3 (GOWN DISPOSABLE) ×9 IMPLANT
GOWN STRL REUS W/TWL XL LVL3 (GOWN DISPOSABLE) ×12 IMPLANT
HOLDER FOLEY CATH W/STRAP (MISCELLANEOUS) ×3 IMPLANT
KIT BASIN OR (CUSTOM PROCEDURE TRAY) ×6 IMPLANT
LEGGING LITHOTOMY PAIR STRL (DRAPES) ×3 IMPLANT
NEEDLE HYPO 22GX1.5 SAFETY (NEEDLE) ×3 IMPLANT
NEEDLE INSUFFLATION 14GA 120MM (NEEDLE) ×3 IMPLANT
PACK CARDIOVASCULAR III (CUSTOM PROCEDURE TRAY) ×3 IMPLANT
PACK GENERAL/GYN (CUSTOM PROCEDURE TRAY) ×3 IMPLANT
PORT LAP GEL ALEXIS MED 5-9CM (MISCELLANEOUS) IMPLANT
POUCH OSTOMY 2  H (OSTOMY) ×3 IMPLANT
RETRACTOR LONE STAR DISPOSABLE (INSTRUMENTS) ×3 IMPLANT
RETRACTOR STAY HOOK 5MM (MISCELLANEOUS) ×3 IMPLANT
RTRCTR WOUND ALEXIS 18CM MED (MISCELLANEOUS)
SCISSORS LAP 5X35 DISP (ENDOMECHANICALS) ×3 IMPLANT
SEAL CANN UNIV 5-8 DVNC XI (MISCELLANEOUS) ×6 IMPLANT
SEAL XI 5MM-8MM UNIVERSAL (MISCELLANEOUS) ×12
SEALER TISSUE G2 STRG ARTC 35C (ENDOMECHANICALS) IMPLANT
SEALER VESSEL DA VINCI XI (MISCELLANEOUS) ×4
SEALER VESSEL EXT DVNC XI (MISCELLANEOUS) ×2 IMPLANT
SET IRRIG TUBING LAPAROSCOPIC (IRRIGATION / IRRIGATOR) ×3 IMPLANT
SLEEVE XCEL OPT CAN 5 100 (ENDOMECHANICALS) IMPLANT
SOLUTION ANTI FOG 6CC (MISCELLANEOUS) IMPLANT
SOLUTION ELECTROLUBE (MISCELLANEOUS) ×3 IMPLANT
SPONGE DRAIN TRACH 4X4 STRL 2S (GAUZE/BANDAGES/DRESSINGS) ×3 IMPLANT
SPONGE LAP 18X18 X RAY DECT (DISPOSABLE) IMPLANT
STAPLER 45 BLU RELOAD XI (STAPLE) IMPLANT
STAPLER 45 BLUE RELOAD XI (STAPLE)
STAPLER 45 GREEN RELOAD XI (STAPLE)
STAPLER 45 GRN RELOAD XI (STAPLE) IMPLANT
STAPLER CANNULA SEAL DVNC XI (STAPLE) ×1 IMPLANT
STAPLER CANNULA SEAL XI (STAPLE) ×2
STAPLER SHEATH (SHEATH) ×2
STAPLER SHEATH ENDOWRIST DVNC (SHEATH) ×1 IMPLANT
STAPLER VISISTAT 35W (STAPLE) ×3 IMPLANT
SUCTION POOLE TIP (SUCTIONS) ×3 IMPLANT
SUT ETHILON 2 0 PS N (SUTURE) ×3 IMPLANT
SUT PDS AB 1 CTX 36 (SUTURE) IMPLANT
SUT PDS AB 1 TP1 96 (SUTURE) IMPLANT
SUT PROLENE 0 CT 2 (SUTURE) IMPLANT
SUT PROLENE 2 0 KS (SUTURE) ×3 IMPLANT
SUT SILK 2 0 (SUTURE) ×2
SUT SILK 2 0 SH CR/8 (SUTURE) ×3 IMPLANT
SUT SILK 2-0 18XBRD TIE 12 (SUTURE) ×1 IMPLANT
SUT SILK 3 0 (SUTURE) ×2
SUT SILK 3 0 SH CR/8 (SUTURE) ×3 IMPLANT
SUT SILK 3-0 18XBRD TIE 12 (SUTURE) ×1 IMPLANT
SUT VIC AB 2-0 SH 18 (SUTURE) ×9 IMPLANT
SUT VIC AB 2-0 SH 27 (SUTURE)
SUT VIC AB 2-0 SH 27X BRD (SUTURE) IMPLANT
SUT VIC AB 4-0 PS2 27 (SUTURE) ×9 IMPLANT
SUT VICRYL 0 UR6 27IN ABS (SUTURE) ×3 IMPLANT
SYR 20CC LL (SYRINGE) ×2 IMPLANT
SYRINGE 10CC LL (SYRINGE) ×3 IMPLANT
SYRINGE 20CC LL (MISCELLANEOUS) ×3 IMPLANT
SYS LAPSCP GELPORT 120MM (MISCELLANEOUS)
SYSTEM LAPSCP GELPORT 120MM (MISCELLANEOUS) IMPLANT
TOWEL OR 17X26 10 PK STRL BLUE (TOWEL DISPOSABLE) ×6 IMPLANT
TOWEL OR NON WOVEN STRL DISP B (DISPOSABLE) ×3 IMPLANT
TRAY FOLEY CATH 14FRSI W/METER (CATHETERS) IMPLANT
TRAY FOLEY METER SIL LF 16FR (CATHETERS) ×3 IMPLANT
TROCAR BLADELESS OPT 5 100 (ENDOMECHANICALS) ×3 IMPLANT
TUBING CONNECTING 10 (TUBING) IMPLANT
TUBING CONNECTING 10' (TUBING)
TUBING FILTER THERMOFLATOR (ELECTROSURGICAL) ×3 IMPLANT
YANKAUER SUCT BULB TIP 10FT TU (MISCELLANEOUS) ×3 IMPLANT

## 2014-05-18 NOTE — Anesthesia Postprocedure Evaluation (Signed)
Anesthesia Post Note  Patient: Seth Villanueva.  Procedure(s) Performed: Procedure(s) (LRB): Robotic assisted low anterior resection with splenic flexure mobilization, and diverting loop ileostomy (N/A)  Anesthesia type: General  Patient location: PACU  Post pain: Pain level controlled  Post assessment: Post-op Vital signs reviewed  Last Vitals: BP 127/72  Pulse 80  Temp(Src) 37.1 C (Oral)  Resp 9  Ht 6\' 1"  (1.854 m)  Wt 160 lb 6 oz (72.746 kg)  BMI 21.16 kg/m2  SpO2 98%  Post vital signs: Reviewed  Level of consciousness: sedated  Complications: No apparent anesthesia complications

## 2014-05-18 NOTE — Anesthesia Preprocedure Evaluation (Signed)
Anesthesia Evaluation  Patient identified by MRN, date of birth, ID band Patient awake    Reviewed: Allergy & Precautions, H&P , NPO status , Patient's Chart, lab work & pertinent test results  Airway Mallampati: II TM Distance: >3 FB Neck ROM: Full    Dental no notable dental hx.    Pulmonary neg pulmonary ROS, former smoker,  breath sounds clear to auscultation  Pulmonary exam normal       Cardiovascular negative cardio ROS  Rhythm:Regular Rate:Normal     Neuro/Psych PSYCHIATRIC DISORDERS Anxiety negative neurological ROS     GI/Hepatic negative GI ROS, Neg liver ROS,   Endo/Other  negative endocrine ROS  Renal/GU negative Renal ROS     Musculoskeletal negative musculoskeletal ROS (+)   Abdominal   Peds  Hematology negative hematology ROS (+)   Anesthesia Other Findings   Reproductive/Obstetrics negative OB ROS                           Anesthesia Physical Anesthesia Plan  ASA: II  Anesthesia Plan: General   Post-op Pain Management:    Induction: Intravenous  Airway Management Planned: Oral ETT  Additional Equipment:   Intra-op Plan:   Post-operative Plan: Extubation in OR  Informed Consent: I have reviewed the patients History and Physical, chart, labs and discussed the procedure including the risks, benefits and alternatives for the proposed anesthesia with the patient or authorized representative who has indicated his/her understanding and acceptance.   Dental advisory given  Plan Discussed with: CRNA  Anesthesia Plan Comments:         Anesthesia Quick Evaluation

## 2014-05-18 NOTE — Op Note (Signed)
05/18/2014  12:50 PM  PATIENT:  Seth Villanueva.  33 y.o. male  Patient Care Team: Leonides Sake, MD as PCP - General (Family Medicine)  PRE-OPERATIVE DIAGNOSIS:  Distal rectal cancer  POST-OPERATIVE DIAGNOSIS:  Distal rectal cancer  PROCEDURE: Robotic assisted ultra-low anterior resection with splenic flexure mobilization, hand sewn colo-anal anastomosis and diverting loop ileostomy  SURGEON:  Surgeon(s): [name], MD Ralene Ok, MD  ASSISTANT: Rosendo Gros   ANESTHESIA:   local and general  EBL:  Total I/O In: 4000 [I.V.:4000] Out: 175 [Urine:175]  Delay start of Pharmacological VTE agent (>24hrs) due to surgical blood loss or risk of bleeding:  no  DRAINS: (58F) Jackson-Pratt drain(s) with closed bulb suction in the pelvis   SPECIMEN:  Source of Specimen:  Rectosigmoid  DISPOSITION OF SPECIMEN:  PATHOLOGY  COUNTS:  YES  PLAN OF CARE: Admit to inpatient   PATIENT DISPOSITION:  PACU - hemodynamically stable.  INDICATION:    This is a 33 yo M with a distal rectal cancer.  He was found to be a uT3N2. He underwent neoadjuvant chemoradiation therapy.  I then recommended resection:  The anatomy & physiology of the digestive tract was discussed.  The pathophysiology was discussed.  Natural history risks without surgery was discussed.   I worked to give an overview of the disease and the frequent need to have multispecialty involvement.  I feel the risks of no intervention will lead to serious problems that outweigh the operative risks; therefore, I recommended a partial colectomy to remove the pathology.  Laparoscopic, robotic & open techniques were discussed.   Risks such as bleeding, infection, abscess, leak, reoperation, possible ostomy, hernia, heart attack, death, and other risks were discussed.  I noted a good likelihood this will help address the problem.   Goals of post-operative recovery were discussed as well.    The patient expressed understanding &  wished to proceed with surgery.  OR FINDINGS:   Patient had good response to neoadjuvant treatment.  The tumor was posterior and located distally to the level of the coccyx.    No obvious metastatic disease on visceral parietal peritoneum or liver.  The anastomosis rests at the dentate line.  DESCRIPTION:   Informed consent was confirmed.  The patient underwent general anaesthesia without difficulty.  The patient was positioned appropriately.  VTE prevention in place.  The patient's abdomen was clipped, prepped, & draped in a sterile fashion.  Surgical timeout confirmed our plan.  The patient was positioned in reverse Trendelenburg.  Abdominal entry was gained using Veress needle.  Entry was clean.  I induced carbon dioxide insufflation.   An 23mm robotic canula was placed after insufflation. Camera inspection revealed no injury.  Extra ports were carefully placed under direct laparoscopic visualization.    I reflected the greater omentum and the upper abdomen the small bowel in the upper abdomen.  The robot was then docked to the patient's left side.  The ports were connected and instruments were inserted under direct visualization.  I scored the base of peritoneum of the right side of the mesentery of the left colon from the ligament of Treitz to the peritoneal reflection of the mid rectum.  The right and left ureters were easily visualized.  The patient had normal appearing anatomy.  I mobilized the sigmoid colon off the pelvic sidewall and retracted this out of the pelvis.  I elevated the sigmoid mesentery and enetered into the retro-mesenteric plane. We were able to identify the left ureter  and gonadal vessels. We kept those posterior within the retroperitoneum and elevated the left colon mesentery off that. I did isolated IMA pedicle but did not ligate it yet.  I continued distally and got into the avascular plane posterior to the mesorectum. This allowed me to help mobilize the rectum as well  by freeing the mesorectum off the sacrum.  I mobilized the peritoneal coverings towards the peritoneal reflection on both the right and left sides of the rectum.  I could see the right and left ureters and stayed away from them.  I bluntly dissected down the presacral space.  Both hypogastric nerves were identified and preserved.  I continued down posteriorly.  I then dissected out the lateral stalks preserving the mesorectal plain as I went.  I carried this anteriorly and opened the peritoneal covering all the way around the rectum.  I came down the left side in similar fashion.  His pelvis was very narrow.  I continued my anterior dissection down the anterior plain under the seminal vesicles and prostate.  I dissected down the the level of the levators in all 4 planes.     I skeletonized the inferior mesenteric artery pedicle.  I went down to its takeoff from the aorta.   I isolated the inferior mesenteric vein off of the ligament of Treitz just cephalad to that as well.  After confirming the left ureter was out of the way, I went ahead and ligated the inferior mesenteric artery pedicle with bipolar EnSeal ~2cm above its takeoff from the aorta.   I did ligate the inferior mesenteric vein in a similar fashion.  We ensured hemostasis. I skeletonized the mesorectum at the junction at the proximal rectum using blunt dissection & bipolar EnSeal.  I mobilized the left colon in a lateral to medial fashion off the line of Toldt up towards the splenic flexure to ensure good mobilization of the left colon to reach into the pelvis.  At this point it appeared that we had enough colon to reach down into the pelvis. I evaluated the anal canal with a digital rectal exam. There was not enough room in the pelvis to perform a stapled resection. I therefore began to complete the remaining dissection transanally. A [place] retractor was placed.  An incision was made at the dentate line circumferentially using Bovie  electrocautery. This was dissected posteriorly over the sphincter complex and into the pelvis. I then completed the dissection circumferentially. I then took a portion of the anal canal at the closest distal margin and sent this for frozen pathology for further examination. This returned as no malignant cells.   The rectum and sigmoid colon were then delivered out of the anal canal. I identified a place in the sigmoid colon to transect. I made an incision with a scalpel. There was no bleeding noted. I pulled the remaining portion of the sigmoid left colon out of the anal canal. I made another incision but still did not encounter any mucosal bleeding. I decided to redirect the robot at this time. The splenic flexure was mobilized using blunt dissection and the vessel sealer device. I mobilized to the mid transverse colon. This allowed for a more colon to reach down into the pelvis. I used 2.5 mm a fire fight injected intravenously to evaluate for perfusion. I identified a spot in the left colon that had good perfusion and brought this down into the pelvis. I then transected the colon at this portion and performed a hand-sewn end  to end anastomosis between the colon and the anal canal using interrupted 2-0 Vicryl sutures.    After this was completed I turned attention back to the patient's abdomen. The camera was reinserted and the abdomen was insufflated. A 19 [place] Blake drain was placed into the pelvis brought out through the left lower quadrant port site. This was sutured in place with 2-0 nylon suture. The pelvis and left pericolic gutter were irrigated. I. And evaluated the colon. This was not twisted and was on no tension. I then identified a portion of the terminal ileum which would reach up to the previously marked ileostomy site. I placed a red rubber tube into the abdomen and place this through the mesentery under the small bowel.  I brought the omentum down into the pelvis. I and a I then made the  ileostomy incision larger using Bovie electrocautery. A cruciate incision was made on the fascia. The terminal ileum was then brought up using the regular catheter. This was fixed into place. The camera was inserted to identify the distal limb and I oriented this posteriorly. After this was completed the abdomen was desufflated and the ports were removed. The 12 mm right lower quadrant port site fascia was closed using a 0 Vicryl suture. The subcutaneous tissues were closed using interrupted 2-0 Vicryl sutures and the skin was closed using a running 4-0 Vicryl suture and Dermabond. After this was completed the ileostomy was matured in standard [place] fashion. This was done using interrupted 2-0 Vicryl sutures. An ostomy appliance was placed. The patient was then awakened from anesthesia and sent to the post anesthesia care in stable condition. All counts were correct per operating room staff.

## 2014-05-18 NOTE — Transfer of Care (Signed)
Immediate Anesthesia Transfer of Care Note  Patient: Seth Villanueva.  Procedure(s) Performed: Procedure(s) (LRB): Robotic assisted low anterior resection with splenic flexure mobilization, and diverting loop ileostomy (N/A)  Patient Location: PACU  Anesthesia Type: General  Level of Consciousness: sedated, patient cooperative and responds to stimulation  Airway & Oxygen Therapy: Patient Spontanous Breathing and Patient connected to face mask oxgen  Post-op Assessment: Report given to PACU RN and Post -op Vital signs reviewed and stable  Post vital signs: Reviewed and stable  Complications: No apparent anesthesia complication

## 2014-05-18 NOTE — H&P (Signed)
Rowe Robert B. Lafferty 04/25/2014 3:58 PM Location: [facility] Patient #: [id number] DOB: 1980/10/03 Single / Language: [name] / Race: White Male  History of Present Illness [name]; 1/61/0960 4:29 PM) Patient words: 4 week reck preop appointment.  The patient is a 33 year old male who presents with a complaint of rectal cancer. Patient is a 33 year old male who underwent a prolonged workup for lower rectal bleeding. He ultimately underwent a colonoscopy which showed an adenocarcinoma the distal rectum. EUS confirmed a T3 N2 rectal cancer. He underwent neoadjuvant chemotherapy and radiation. Currently his weight is stable. He is having regular bowel movements. His pain is under control. He did well with chemotherapy and radiation.   Other Problems [name], [place]; 04/25/2014 3:58 PM) Hemorrhoids Rectal Cancer  Past Surgical History [name], [place]; 04/25/2014 3:58 PM) No pertinent past surgical history  Diagnostic Studies History [name], [place]; 04/25/2014 3:58 PM) Colonoscopy within last year  Allergies [name], [place]; 04/25/2014 3:59 PM) Azithromycin *CHEMICALS* Hives. Compazine *ANTIPSYCHOTICS/ANTIMANIC AGENTS* anxiety  Medication History [name], [place]; 04/25/2014 4:01 PM) OxyCODONE HCl (5MG  Tablet, Oral prn) Active. Hydrocortisone Acetate (25MG  Suppository, Rectal prn) Active. Polyethylene Glycol 3350 (Oral prn) Active. Acetaminophen (325MG  Tablet, Oral prn) Active. Ibuprofen (200MG  Capsule, Oral prn) Active. Imodium A-D (2MG  Tablet, Oral prn) Active. Sudafed (30MG  Tablet, Oral prn) Active. Mylicon (40MG /0.6ML Suspension, Oral prn) Active.  Social History [name] [place], [place]; 04/25/2014 3:58 PM) Alcohol use Occasional alcohol use. No caffeine use No drug use Tobacco use Former smoker.  Family History [name] [place], [place]; 04/25/2014 3:58 PM) Arthritis Family Members In General. Breast Cancer Family  Members In General. Heart Disease Family Members In General. Heart disease in male family member before age 77 Heart disease in male family member before age 44 Hypertension Family Members In General. Thyroid problems Family Members In General.  Review of Systems [name] [place] MA; 04/25/2014 3:58 PM) General Not Present- Appetite Loss, Chills, Fatigue, Fever, Night Sweats, Weight Gain and Weight Loss. Skin Not Present- Change in Wart/Mole, Dryness, Hives, Jaundice, New Lesions, Non-Healing Wounds, Rash and Ulcer. HEENT Not Present- Earache, Hearing Loss, Hoarseness, Nose Bleed, Oral Ulcers, Ringing in the Ears, Seasonal Allergies, Sinus Pain, Sore Throat, Visual Disturbances, Wears glasses/contact lenses and Yellow Eyes. Respiratory Not Present- Bloody sputum, Chronic Cough, Difficulty Breathing, Snoring and Wheezing. Breast Not Present- Breast Mass, Breast Pain, Nipple Discharge and Skin Changes. Cardiovascular Not Present- Chest Pain, Difficulty Breathing Lying Down, Leg Cramps, Palpitations, Rapid Heart Rate, Shortness of Breath and Swelling of Extremities. Gastrointestinal Present- Bloating and Hemorrhoids. Not Present- Abdominal Pain, Bloody Stool, Change in Bowel Habits, Chronic diarrhea, Constipation, Difficulty Swallowing, Excessive gas, Gets full quickly at meals, Indigestion, Nausea, Rectal Pain and Vomiting. Male Genitourinary Not Present- Blood in Urine, Change in Urinary Stream, Frequency, Impotence, Nocturia, Painful Urination, Urgency and Urine Leakage. Musculoskeletal Not Present- Back Pain, Joint Pain, Joint Stiffness, Muscle Pain, Muscle Weakness and Swelling of Extremities. Neurological Not Present- Decreased Memory, Fainting, Headaches, Numbness, Seizures, Tingling, Tremor, Trouble walking and Weakness. Psychiatric Not Present- Anxiety, Bipolar, Change in Sleep Pattern, Depression, Fearful and Frequent crying. Endocrine Not Present- Cold Intolerance, Excessive Hunger,  Hair Changes, Heat Intolerance, Hot flashes and New Diabetes. Hematology Not Present- Easy Bruising, Excessive bleeding, Gland problems, HIV and Persistent Infections.   Vitals [name] [place] MA; 04/25/2014 3:59 PM) 04/25/2014 3:58 PM Weight: 158.6 lb Height: 73in Body Surface Area: 1.92 m Body Mass Index: 20.92 kg/m Temp.: 98.49F(Oral)  Pulse: 68 (Regular)  Resp.: 14 (Unlabored)  BP: 142/90 (Sitting, Left Arm, Standard)    Physical Exam [name]; 02/10/5008 4:44 PM) General Mental Status-Alert. General Appearance-Cooperative.  Chest and Lung Exam Auscultation Breath sounds - Normal.  Cardiovascular Auscultation Heart Sounds - Normal heart sounds.  Abdomen Palpation/Percussion Palpation and Percussion of the abdomen reveal - Soft and Non Tender.  Rectal Anorectal Exam Internal - Note: tumor scarring noted approximately 4 cm from anal verge just above the coccyx posteriorly.    Assessment & Plan [name]; 3/81/8299 4:28 PM) PRIMARY CANCER OF RECTUM (154.1  C20) Story: this is a 33 year old who began to have rectal bleeding last November. He was initially attributed to hemorrhoids. He did not get better. He ultimately ended up seeing a gastroenterologist and was found to have a rectal mass on physical exam. Biopsies confirmed adenocarcinoma. The area was tattooed. CT scan of chest abdomen and pelvis were negative for metastatic disease. EUS on 01/30/2014 showed a T3 N2 lesion Impression: on exam the tumor appears to be regressing. It is posterior and approximately 3-4 cm from the anal verge. We discussed surgical resection I believe he'll be a good candidate for a robotic-assisted low anterior resection with ultralow anastomosis. He will need a diverting ostomy. We'll set him up to see the ostomy nurse for marking prior to the surgery. He understands that there is a possibility that we will have to do an abdominal perineal resection. The  surgery and anatomy were described to the patient as well as the risks of surgery and the possible complications. These include: Bleeding, deep abdominal infections and possible wound complications such as hernia and infection, damage to adjacent structures, leak of surgical connections, which can lead to other surgeries and possibly an ostomy, possible need for other procedures, such as abscess drains in radiology, possible prolonged hospital stay, possible diarrhea from removal of part of the colon, possible bladder or sexual dysfunction if having rectal surgery, possible constipation from narcotics, prolonged fatigue/weakness or appetite loss, possible early recurrence of of disease, possible complications of their medical problems such as heart disease or arrhythmias or lung problems, death (less than 1%). I believe the patient understands and wishes to proceed with the surgery.

## 2014-05-19 LAB — BASIC METABOLIC PANEL
ANION GAP: 11 (ref 5–15)
BUN: 8 mg/dL (ref 6–23)
CALCIUM: 8.4 mg/dL (ref 8.4–10.5)
CO2: 26 mEq/L (ref 19–32)
CREATININE: 0.96 mg/dL (ref 0.50–1.35)
Chloride: 104 mEq/L (ref 96–112)
GFR calc Af Amer: >90 mL/min (ref >90)
GFR calc non Af Amer: >90 mL/min (ref >90)
Glucose, Bld: 101 mg/dL — ABNORMAL HIGH (ref 70–99)
Potassium: 4.2 mEq/L (ref 3.7–5.3)
Sodium: 141 mEq/L (ref 137–147)

## 2014-05-19 LAB — CBC
HEMATOCRIT: 38.4 % — AB (ref 39.0–52.0)
Hemoglobin: 13.4 g/dL (ref 13.0–17.0)
MCH: 32.9 pg (ref 26.0–34.0)
MCHC: 34.9 g/dL (ref 30.0–36.0)
MCV: 94.3 fL (ref 78.0–100.0)
Platelets: 142 10*3/uL — ABNORMAL LOW (ref 150–400)
RBC: 4.07 MIL/uL — ABNORMAL LOW (ref 4.22–5.81)
RDW: 13.1 % (ref 11.5–15.5)
WBC: 9.5 10*3/uL (ref 4.0–10.5)

## 2014-05-19 MED ORDER — KCL IN DEXTROSE-NACL 20-5-0.45 MEQ/L-%-% IV SOLN
INTRAVENOUS | Status: DC
  Administered 2014-05-19: 50 mL/h via INTRAVENOUS
  Administered 2014-05-20 – 2014-05-22 (×4): via INTRAVENOUS
  Filled 2014-05-19 (×7): qty 1000

## 2014-05-19 NOTE — Progress Notes (Signed)
1 Day Post-Op Robotic assisted LAR with coloanal anastomosis and diverting loop ileostomy Subjective: Doing well, pain controlled, no nausea  Objective: Vital signs in last 24 hours: Temp:  [97.4 F (36.3 C)-98.7 F (37.1 C)] 97.8 F (36.6 C) (10/10 0621) Pulse Rate:  [66-83] 72 (10/10 0621) Resp:  [8-14] 11 (10/10 0621) BP: (105-136)/(55-80) 105/68 mmHg (10/10 0621) SpO2:  [41 %-100 %] 100 % (10/10 0621)   Intake/Output from previous day: 10/09 0701 - 10/10 0700 In: 6956.3 [I.V.:6906.3; IV Piggyback:50] Out: 4010 [Urine:3425; Drains:290] Intake/Output this shift:   General appearance: alert and cooperative GI: normal findings: soft, non-distended ostomy: pink, viable, stool output noted  Incision: healing well, no significant drainage, no significant erythema  Lab Results:   Recent Labs  05/19/14 0453  WBC 9.5  HGB 13.4  HCT 38.4*  PLT 142*   BMET  Recent Labs  05/19/14 0453  NA 141  K 4.2  CL 104  CO2 26  GLUCOSE 101*  BUN 8  CREATININE 0.96  CALCIUM 8.4   PT/INR No results found for this basename: LABPROT, INR,  in the last 72 hours ABG No results found for this basename: PHART, PCO2, PO2, HCO3,  in the last 72 hours  MEDS, Scheduled . acetaminophen  1,000 mg Oral 4 times per day  . alvimopan  12 mg Oral BID  . antiseptic oral rinse  7 mL Mouth Rinse q12n4p  . chlorhexidine  15 mL Mouth Rinse BID  . enoxaparin (LOVENOX) injection  40 mg Subcutaneous Q24H  . morphine   Intravenous 6 times per day    Studies/Results: No results found.  Assessment: s/p Procedure(s): Robotic assisted low anterior resection with splenic flexure mobilization, and diverting loop ileostomy Patient Active Problem List   Diagnosis Date Noted  . Rectal cancer 02/01/2014    Expected post op course  Plan: Advance diet to clear liquids Decrease IVF's Ambulate   LOS: 1 day     .[name], MD [facility],  [place]   05/19/2014 8:24 AM

## 2014-05-20 LAB — CBC
HCT: 41 % (ref 39.0–52.0)
Hemoglobin: 14.2 g/dL (ref 13.0–17.0)
MCH: 33.1 pg (ref 26.0–34.0)
MCHC: 34.6 g/dL (ref 30.0–36.0)
MCV: 95.6 fL (ref 78.0–100.0)
PLATELETS: 130 10*3/uL — AB (ref 150–400)
RBC: 4.29 MIL/uL (ref 4.22–5.81)
RDW: 13.3 % (ref 11.5–15.5)
WBC: 8.3 10*3/uL (ref 4.0–10.5)

## 2014-05-20 LAB — BASIC METABOLIC PANEL
Anion gap: 9 (ref 5–15)
BUN: 6 mg/dL (ref 6–23)
CO2: 27 mEq/L (ref 19–32)
Calcium: 8.6 mg/dL (ref 8.4–10.5)
Chloride: 102 mEq/L (ref 96–112)
Creatinine, Ser: 0.91 mg/dL (ref 0.50–1.35)
Glucose, Bld: 95 mg/dL (ref 70–99)
Potassium: 3.6 mEq/L — ABNORMAL LOW (ref 3.7–5.3)
Sodium: 138 mEq/L (ref 137–147)

## 2014-05-20 MED ORDER — OXYCODONE-ACETAMINOPHEN 5-325 MG PO TABS
1.0000 | ORAL_TABLET | ORAL | Status: DC | PRN
  Administered 2014-05-20 (×4): 1 via ORAL
  Administered 2014-05-21: 2 via ORAL
  Administered 2014-05-21 – 2014-05-27 (×30): 1 via ORAL
  Filled 2014-05-20 (×13): qty 1
  Filled 2014-05-20: qty 2
  Filled 2014-05-20 (×13): qty 1
  Filled 2014-05-20: qty 2
  Filled 2014-05-20 (×3): qty 1
  Filled 2014-05-20: qty 2
  Filled 2014-05-20: qty 1
  Filled 2014-05-20 (×2): qty 2
  Filled 2014-05-20 (×2): qty 1

## 2014-05-20 MED ORDER — MORPHINE SULFATE 2 MG/ML IJ SOLN
2.0000 mg | INTRAMUSCULAR | Status: DC | PRN
  Administered 2014-05-20 – 2014-05-25 (×9): 2 mg via INTRAVENOUS
  Filled 2014-05-20 (×9): qty 1

## 2014-05-20 NOTE — Progress Notes (Signed)
2 Days Post-Op Robotic assisted LAR with coloanal anastomosis and diverting loop ileostomy Subjective: Doing well, pain controlled, no nausea.  Tolerating clears.  Feels bloated.  Good UOP.  Foley out this am  Objective: Vital signs in last 24 hours: Temp:  [98.3 F (36.8 C)-98.8 F (37.1 C)] 98.6 F (37 C) (10/11 0559) Pulse Rate:  [71-87] 87 (10/11 0559) Resp:  [8-18] 14 (10/11 0829) BP: (108-115)/(53-62) 108/53 mmHg (10/11 0559) SpO2:  [98 %-100 %] 100 % (10/11 0829)   Intake/Output from previous day: 10/10 0701 - 10/11 0700 In: 1977.5 [P.O.:960; I.V.:1017.5] Out: 4740 [Urine:4050; Drains:175; Stool:515] Intake/Output this shift: Total I/O In: -  Out: 50 [Stool:50] General appearance: alert and cooperative GI: normal findings: soft, non-distended ostomy: pink, viable, edematous.  588ml out yesterday  Incision: healing well, no significant drainage, no significant erythema  Lab Results:   Recent Labs  05/19/14 0453 05/20/14 0713  WBC 9.5 8.3  HGB 13.4 14.2  HCT 38.4* 41.0  PLT 142* 130*   BMET  Recent Labs  05/19/14 0453 05/20/14 0713  NA 141 138  K 4.2 3.6*  CL 104 102  CO2 26 27  GLUCOSE 101* 95  BUN 8 6  CREATININE 0.96 0.91  CALCIUM 8.4 8.6   PT/INR No results found for this basename: LABPROT, INR,  in the last 72 hours ABG No results found for this basename: PHART, PCO2, PO2, HCO3,  in the last 72 hours  MEDS, Scheduled . antiseptic oral rinse  7 mL Mouth Rinse q12n4p  . chlorhexidine  15 mL Mouth Rinse BID  . enoxaparin (LOVENOX) injection  40 mg Subcutaneous Q24H    Studies/Results: No results found.  Assessment: s/p Procedure(s): Robotic assisted low anterior resection with splenic flexure mobilization, and diverting loop ileostomy Patient Active Problem List   Diagnosis Date Noted  . Rectal cancer 02/01/2014    Expected post op course  Plan: Advance diet to soft foods Decrease IVF's D/C PCA. PO pain meds Ambulate   LOS:  2 days     .[name], [facility], [place]   05/20/2014 8:38 AM

## 2014-05-21 LAB — CBC
HCT: 49.3 % (ref 39.0–52.0)
Hemoglobin: 17.4 g/dL — ABNORMAL HIGH (ref 13.0–17.0)
MCH: 33 pg (ref 26.0–34.0)
MCHC: 35.3 g/dL (ref 30.0–36.0)
MCV: 93.5 fL (ref 78.0–100.0)
PLATELETS: 181 10*3/uL (ref 150–400)
RBC: 5.27 MIL/uL (ref 4.22–5.81)
RDW: 13 % (ref 11.5–15.5)
WBC: 12.7 10*3/uL — AB (ref 4.0–10.5)

## 2014-05-21 LAB — BASIC METABOLIC PANEL
ANION GAP: 16 — AB (ref 5–15)
BUN: 6 mg/dL (ref 6–23)
CO2: 24 meq/L (ref 19–32)
Calcium: 9.2 mg/dL (ref 8.4–10.5)
Chloride: 98 mEq/L (ref 96–112)
Creatinine, Ser: 0.96 mg/dL (ref 0.50–1.35)
GFR calc non Af Amer: >90 mL/min (ref >90)
Glucose, Bld: 124 mg/dL — ABNORMAL HIGH (ref 70–99)
Potassium: 3.9 mEq/L (ref 3.7–5.3)
SODIUM: 138 meq/L (ref 137–147)

## 2014-05-21 MED ORDER — PANTOPRAZOLE SODIUM 20 MG PO TBEC
20.0000 mg | DELAYED_RELEASE_TABLET | Freq: Every day | ORAL | Status: DC
  Administered 2014-05-21 – 2014-05-26 (×5): 20 mg via ORAL
  Filled 2014-05-21 (×7): qty 1

## 2014-05-21 MED ORDER — CALCIUM POLYCARBOPHIL 625 MG PO TABS
625.0000 mg | ORAL_TABLET | Freq: Two times a day (BID) | ORAL | Status: DC
  Administered 2014-05-21 – 2014-05-23 (×5): 625 mg via ORAL
  Filled 2014-05-21 (×8): qty 1

## 2014-05-21 MED ORDER — ALUM & MAG HYDROXIDE-SIMETH 200-200-20 MG/5ML PO SUSP
30.0000 mL | ORAL | Status: DC | PRN
Start: 2014-05-21 — End: 2014-05-27
  Administered 2014-05-21 – 2014-05-24 (×6): 30 mL via ORAL
  Filled 2014-05-21 (×6): qty 30

## 2014-05-21 MED ORDER — SODIUM CHLORIDE 0.9 % IV BOLUS (SEPSIS)
500.0000 mL | Freq: Once | INTRAVENOUS | Status: AC
  Administered 2014-05-21: 04:00:00 via INTRAVENOUS

## 2014-05-21 MED ORDER — LOPERAMIDE HCL 2 MG PO CAPS
2.0000 mg | ORAL_CAPSULE | Freq: Three times a day (TID) | ORAL | Status: DC
  Administered 2014-05-21 – 2014-05-22 (×6): 2 mg via ORAL
  Filled 2014-05-21 (×9): qty 1

## 2014-05-21 MED ORDER — LACTATED RINGERS IV BOLUS (SEPSIS)
500.0000 mL | INTRAVENOUS | Status: DC | PRN
  Administered 2014-05-21: 250 mL via INTRAVENOUS

## 2014-05-21 NOTE — Progress Notes (Signed)
Pt states that he felt much better after his normal saline bolus, and since I had switched him back over to his maintance fluid of D5 1/2 NS with 20 K he has felt "ansy". I informed pt that the only difference between the NS and what he had been given since his surgery was K and D5. He still insisted that the normal saline made him better and requested that I hook him back to it again. I did so. A few minutes after I had changed fluid to normal saline, better states that he felt better.  Will inform MD.

## 2014-05-21 NOTE — Progress Notes (Signed)
3 Days Post-Op Robotic assisted LAR with coloanal anastomosis and diverting loop ileostomy Subjective: Doing well, pain controlled, no nausea.  Tolerating some liquids.  Feels bloated.  Good UOP.  High ileostomy output  Objective: Vital signs in last 24 hours: Temp:  [98 F (36.7 C)-98.6 F (37 C)] 98 F (36.7 C) (10/12 0415) Pulse Rate:  [83-96] 93 (10/12 0415) Resp:  [16-18] 18 (10/12 0415) BP: (110-121)/(66-71) 121/69 mmHg (10/12 0415) SpO2:  [100 %] 100 % (10/12 0415)   Intake/Output from previous day: 10/11 0701 - 10/12 0700 In: 1263.3 [P.O.:360; I.V.:403.3; IV Piggyback:500] Out: 3670 [Urine:1600; Drains:655; YSAYT:0160] Intake/Output this shift: Total I/O In: -  Out: 240 [Drains:40; Stool:200] General appearance: alert and cooperative GI: normal findings: soft, non-distended ostomy: pink, viable, edematous.  1452ml out yesterday  Incision: healing well, no significant drainage, no significant erythema  Lab Results:   Recent Labs  05/20/14 0713 05/21/14 0418  WBC 8.3 12.7*  HGB 14.2 17.4*  HCT 41.0 49.3  PLT 130* 181   BMET  Recent Labs  05/20/14 0713 05/21/14 0418  NA 138 138  K 3.6* 3.9  CL 102 98  CO2 27 24  GLUCOSE 95 124*  BUN 6 6  CREATININE 0.91 0.96  CALCIUM 8.6 9.2   PT/INR No results found for this basename: LABPROT, INR,  in the last 72 hours ABG No results found for this basename: PHART, PCO2, PO2, HCO3,  in the last 72 hours  MEDS, Scheduled . chlorhexidine  15 mL Mouth Rinse BID  . enoxaparin (LOVENOX) injection  40 mg Subcutaneous Q24H  . loperamide  2 mg Oral TID  . pantoprazole  20 mg Oral Daily  . polycarbophil  625 mg Oral BID    Studies/Results: No results found.  Assessment: s/p Procedure(s): Robotic assisted low anterior resection with splenic flexure mobilization, and diverting loop ileostomy Patient Active Problem List   Diagnosis Date Noted  . Rectal cancer 02/01/2014    Expected post op course, a little  dehydrated this am  Plan: Cont PO diet as tolerated Will increase IVF's to help with dehydration.  Fiber and imodium added to help with ileostomy output.   LR fluid replacements for ostomy output. Cont PO pain meds Ambulate Ostomy Education today   LOS: 3 days     .[name], MD [facility], [place]   05/21/2014 8:33 AM

## 2014-05-21 NOTE — Consult Note (Addendum)
WOC ostomy consult note Stoma type/location: RLQ ileostomy Stomal assessment/size: Round, budded, os at center.  Red, moist.  Red rubber catheter "bridge" fashioned in a loop in place. Peristomal assessment: intact, clear Treatment options for stomal/peristomal skin: skin barrier ring used as patient has had two episodes of leakage over the weekend. Output Thin brown stool Ostomy pouching: 1pc.convex pouch used today to "hug" stoma.  Education provided: Extended session with patient and parents during which A&P, stoma and pouch characteristics are covered.  Patient is both nauseated and in pain and is medicated during session, but is eager to learn and does not wish to suspend the visit.  Many questions (all appropriate) answered.  Items covered are wear time of pouching systems, emptying procedure (simulated emptying demonstrated), clothing with "pockets" to enhance camouflaging of pouch beneath clothing, dietary considerations, activity.  Family and patient observed the pouching system removal, sizing, pouching system preparation and application today. Questions answered about when rod will be removed.   Written materials provided and supplies left at bedside.  Patient is established with [facility] today per his request. [place] nursing team will continue to follow along with you for ostomy teaching and support. Maudie Flakes, MSN, RN, [place], [place], [place] [phone])

## 2014-05-21 NOTE — Progress Notes (Signed)
Notified MD's that his JP output was over 800 cc since 0600 yesterday. Also told her that his ostomy drainage was over 1500 cc  since the same time yesterday. I reported that the JP drainage was pinkish red in color. I also mentioned that his abd was distended. Received order for 500 cc NS bolus. Will continue to monitor.

## 2014-05-22 ENCOUNTER — Other Ambulatory Visit: Payer: Self-pay | Admitting: *Deleted

## 2014-05-22 DIAGNOSIS — C20 Malignant neoplasm of rectum: Secondary | ICD-10-CM

## 2014-05-22 MED ORDER — HYDROCORTISONE ACETATE 25 MG RE SUPP: 25.0000 mg | Freq: Every day | RECTAL | Status: DC | PRN

## 2014-05-22 NOTE — Progress Notes (Signed)
4 Days Post-Op Robotic assisted LAR with coloanal anastomosis and diverting loop ileostomy Subjective: Doing well, pain controlled, no nausea.  Tolerating some soft foods.  Feels less bloated.  Good UOP.  ileostomy output still a bit high but trending down.  Objective: Vital signs in last 24 hours: Temp:  [98 F (36.7 C)-98.6 F (37 C)] 98 F (36.7 C) (10/13 0543) Pulse Rate:  [75-107] 75 (10/13 0543) Resp:  [16-18] 16 (10/13 0543) BP: (114-122)/(65-77) 114/65 mmHg (10/13 0543) SpO2:  [98 %-99 %] 99 % (10/13 0543)   Intake/Output from previous day: 10/12 0701 - 10/13 0700 In: 3471.3 [P.O.:480; I.V.:2741.3; IV Piggyback:250] Out: 4390 [Urine:2800; Drains:480; Stool:1110] Intake/Output this shift: Total I/O In: -  Out: 452 [Urine:400; Drains:42; Stool:10] General appearance: alert and cooperative GI: normal findings: soft, non-distended ostomy: pink, viable, edematous.  1147ml out yesterday  Incision: healing well, no significant drainage, no significant erythema  Lab Results:   Recent Labs  05/20/14 0713 05/21/14 0418  WBC 8.3 12.7*  HGB 14.2 17.4*  HCT 41.0 49.3  PLT 130* 181   BMET  Recent Labs  05/20/14 0713 05/21/14 0418  NA 138 138  K 3.6* 3.9  CL 102 98  CO2 27 24  GLUCOSE 95 124*  BUN 6 6  CREATININE 0.91 0.96  CALCIUM 8.6 9.2   PT/INR No results found for this basename: LABPROT, INR,  in the last 72 hours ABG No results found for this basename: PHART, PCO2, PO2, HCO3,  in the last 72 hours  MEDS, Scheduled . enoxaparin (LOVENOX) injection  40 mg Subcutaneous Q24H  . loperamide  2 mg Oral TID  . pantoprazole  20 mg Oral Daily  . polycarbophil  625 mg Oral BID    Studies/Results: No results found.  Assessment: s/p Procedure(s): Robotic assisted low anterior resection with splenic flexure mobilization, and diverting loop ileostomy Patient Active Problem List   Diagnosis Date Noted  . Rectal cancer 02/01/2014    Expected post op  course, a little dehydrated this am  Plan: Cont PO diet as tolerated Saline lock IV.  Fiber and imodium added to help with ileostomy output.   LR fluid replacements for ostomy output. Cont PO pain meds Ambulate Ostomy Education  Possible d/c tom or Thur Remove JP Ostomy bar out tom   LOS: 4 days     .[name], MD [facility], [place]   05/22/2014 8:50 AM

## 2014-05-23 ENCOUNTER — Other Ambulatory Visit (INDEPENDENT_AMBULATORY_CARE_PROVIDER_SITE_OTHER): Payer: Self-pay | Admitting: General Surgery

## 2014-05-23 ENCOUNTER — Inpatient Hospital Stay (HOSPITAL_COMMUNITY): Payer: 59

## 2014-05-23 MED ORDER — PROMETHAZINE HCL 25 MG/ML IJ SOLN
12.5000 mg | Freq: Four times a day (QID) | INTRAMUSCULAR | Status: DC | PRN
  Administered 2014-05-23 – 2014-05-24 (×2): 12.5 mg via INTRAVENOUS
  Filled 2014-05-23 (×2): qty 1

## 2014-05-23 MED ORDER — SODIUM CHLORIDE 0.9 % IV SOLN
INTRAVENOUS | Status: DC
  Administered 2014-05-23 – 2014-05-25 (×5): via INTRAVENOUS
  Administered 2014-05-26: 100 mL/h via INTRAVENOUS

## 2014-05-23 NOTE — Care Management Note (Signed)
    Page 1 of 1   05/23/2014     11:20:04 AM CARE MANAGEMENT NOTE 05/23/2014  Patient:  Seth Villanueva, Seth Villanueva   Account Number:  [id number]  Date Initiated:  05/23/2014  Documentation initiated by:  Sunday Spillers  Subjective/Objective Assessment:   33 yo male admitted s/p LARcolostomy. PTA lived at home with mother     Action/Plan:   Home when stable   Anticipated DC Date:  05/24/2014   Anticipated DC Plan:  [place]  CM consult      [facility] Choice  HOME HEALTH   Choice offered to / List presented to:  C-1 Patient        [place] [place] arranged  HH-1 RN  [place].   Status of service:  Completed, signed off Medicare Important Message given?   (If response is "NO", the following Medicare IM given date fields will be blank) Date Medicare IM given:   Medicare IM given by:   Date Additional Medicare IM given:   Additional Medicare IM given by:    Discharge Disposition:  HOME/SELF CARE  Per UR Regulation:  Reviewed for med. necessity/level of care/duration of stay  If discussed at [place] of Stay Meetings, dates discussed:    Comments:

## 2014-05-23 NOTE — Progress Notes (Signed)
5 Days Post-Op Robotic assisted LAR with coloanal anastomosis and diverting loop ileostomy Subjective: Had a rough night.  Feels bloated.  Tolerating some soft foods.  Excellent UOP.  ileostomy output much lower.  Objective: Vital signs in last 24 hours: Temp:  [98.6 F (37 C)-98.7 F (37.1 C)] 98.7 F (37.1 C) (10/14 0536) Pulse Rate:  [80-82] 82 (10/14 0536) Resp:  [17] 17 (10/14 0536) BP: (113-126)/(65-67) 126/67 mmHg (10/14 0536) SpO2:  [100 %] 100 % (10/14 0536)   Intake/Output from previous day: 10/13 0701 - 10/14 0700 In: 600 [P.O.:600] Out: 2662 [Urine:2325; Drains:102; Stool:235] Intake/Output this shift:   General appearance: alert and cooperative GI: normal findings: soft, non-distended ostomy: pink, viable, edematous.  224ml out yesterday  Incision: healing well, no significant drainage, no significant erythema  Lab Results:   Recent Labs  05/21/14 0418  WBC 12.7*  HGB 17.4*  HCT 49.3  PLT 181   BMET  Recent Labs  05/21/14 0418  NA 138  K 3.9  CL 98  CO2 24  GLUCOSE 124*  BUN 6  CREATININE 0.96  CALCIUM 9.2   PT/INR No results found for this basename: LABPROT, INR,  in the last 72 hours ABG No results found for this basename: PHART, PCO2, PO2, HCO3,  in the last 72 hours  MEDS, Scheduled . enoxaparin (LOVENOX) injection  40 mg Subcutaneous Q24H  . pantoprazole  20 mg Oral Daily  . polycarbophil  625 mg Oral BID    Studies/Results: No results found.  Assessment: s/p Procedure(s): Robotic assisted low anterior resection with splenic flexure mobilization, and diverting loop ileostomy Patient Active Problem List   Diagnosis Date Noted  . Rectal cancer 02/01/2014    Expected post op course, a little dehydrated this am  Plan: Cont PO diet as tolerated Saline lock IV.  Cont Fiber to help with ileostomy output.  Will d/c imodium given pt's output and symptoms Will have ostomy RN remove ostomy bar today Cont PO pain  meds Ambulate Ostomy Education  Possible d/c Thur     LOS: 5 days     .[name], [facility], [place]   05/23/2014 7:15 AM

## 2014-05-23 NOTE — Consult Note (Signed)
WOC ostomy follow up Stoma type/location: RLQ ileostomy Stomal assessment/size: 1 and 1/4 inch, round, red, moist.  Os at center. Rod intact, removed without difficulty this visit. Peristomal assessment: intact, clear Treatment options for stomal/peristomal skin: None indicated Output 100 ml thin brown effluent Ostomy pouching: 1pc.pouch with convexity.  Education provided: Patient taught about rod function and prepared for removal.  Patient did not have a good night, complaining of gas pain, weakness, back pain and nausea today.  I have asked the bedside RN ato notify Dr. Marcello Moores and inquire about restarting IV fluids. Enrolled patient in [place] [facility] Discharge program: Yes [place] nursing team will continue to follow, andll remain available to this patient, the nursing and surgical team.  Thanks, Maudie Flakes, MSN, RN, [place], [place], [place] [phone])

## 2014-05-24 MED ORDER — LACTATED RINGERS IV BOLUS (SEPSIS)
1000.0000 mL | Freq: Once | INTRAVENOUS | Status: AC
  Administered 2014-05-24: 1000 mL via INTRAVENOUS

## 2014-05-24 MED ORDER — PROMETHAZINE HCL 25 MG/ML IJ SOLN
12.5000 mg | Freq: Four times a day (QID) | INTRAMUSCULAR | Status: DC | PRN
  Filled 2014-05-24: qty 1

## 2014-05-24 NOTE — Plan of Care (Signed)
Problem: Phase II Progression Outcomes Goal: Tolerating diet Outcome: Not Progressing Vomiting this am after attempting full liquid diet. Goal: IV changed to normal saline lock Outcome: Not Progressing Receiving IVF; vomited this am  Problem: Phase III Progression Outcomes Goal: Ambulating in hall BID/or as ordered Outcome: Not Progressing Unable to ambulate in hall today; nauseous, vomited.

## 2014-05-24 NOTE — Progress Notes (Addendum)
6 Days Post-Op Robotic assisted LAR with coloanal anastomosis and diverting loop ileostomy Subjective: Had a rough night.  Vomited some.  Feels better this am.  Objective: Vital signs in last 24 hours: Temp:  [97.5 F (36.4 C)-98.9 F (37.2 C)] 98.4 F (36.9 C) (10/15 0600) Pulse Rate:  [95-101] 101 (10/15 0600) Resp:  [18] 18 (10/15 0600) BP: (125-145)/(77-95) 125/77 mmHg (10/15 0600) SpO2:  [98 %-100 %] 98 % (10/15 0600)   Intake/Output from previous day: 10/14 0701 - 10/15 0700 In: 1418.3 [P.O.:600; I.V.:818.3] Out: 1350 [Urine:650; Stool:700] Intake/Output this shift:   General appearance: alert and cooperative GI: normal findings: soft, non-distended ostomy: pink, viable, edematous.  250ml out yesterday  Incision: healing well, no significant drainage, no significant erythema  Lab Results:  No results found for this basename: WBC, HGB, HCT, PLT,  in the last 72 hours BMET No results found for this basename: NA, K, CL, CO2, GLUCOSE, BUN, CREATININE, CALCIUM,  in the last 72 hours PT/INR No results found for this basename: LABPROT, INR,  in the last 72 hours ABG No results found for this basename: PHART, PCO2, PO2, HCO3,  in the last 72 hours  MEDS, Scheduled . enoxaparin (LOVENOX) injection  40 mg Subcutaneous Q24H  . pantoprazole  20 mg Oral Daily  . polycarbophil  625 mg Oral BID    Studies/Results: Dg Abd 1 View  05/23/2014   CLINICAL DATA:  Bilious vomiting following GI surgery. History of invasive rectal cancer.  EXAM: ABDOMEN - 1 VIEW  COMPARISON:  CT abdomen pelvis- 01/29/2014  FINDINGS: There is moderate gas distention of multiple loops of small bowel with index loop of small bowel within the left mid/ lower abdomen measuring approximately 4.4 cm in diameter. These findings are associated with a conspicuous paucity of distal colonic gas with very minimal amount of air seen within the ascending colon. No supine evidence of pneumoperitoneum. No definite  pneumatosis or portal venous gas.  No definite abnormal intra-abdominal calcifications.  No acute osseus abnormalities.  IMPRESSION: Findings worrisome for at least partial small bowel obstruction.   Electronically Signed   By: Sandi Mariscal M.D.   On: 05/23/2014 21:55    Assessment: s/p Procedure(s): Robotic assisted low anterior resection with splenic flexure mobilization, and diverting loop ileostomy Patient Active Problem List   Diagnosis Date Noted  . Rectal cancer 02/01/2014    Expected post op course, a little dehydrated this am.  Pt does not have pSBO.  Does not have air in his colon because he is diverted.    Plan: Cont IV fluids today.  Full liquids PO Cont PO pain meds Ambulate Ostomy Education  Possible d/c soon     LOS: 6 days     .[name], [facility], [place]   05/24/2014 8:30 AM

## 2014-05-25 LAB — CBC
HEMATOCRIT: 44.1 % (ref 39.0–52.0)
Hemoglobin: 15.1 g/dL (ref 13.0–17.0)
MCH: 32.6 pg (ref 26.0–34.0)
MCHC: 34.2 g/dL (ref 30.0–36.0)
MCV: 95.2 fL (ref 78.0–100.0)
PLATELETS: 219 10*3/uL (ref 150–400)
RBC: 4.63 MIL/uL (ref 4.22–5.81)
RDW: 12.7 % (ref 11.5–15.5)
WBC: 8.9 10*3/uL (ref 4.0–10.5)

## 2014-05-25 LAB — BASIC METABOLIC PANEL
ANION GAP: 12 (ref 5–15)
BUN: 15 mg/dL (ref 6–23)
CALCIUM: 8.6 mg/dL (ref 8.4–10.5)
CHLORIDE: 95 meq/L — AB (ref 96–112)
CO2: 28 meq/L (ref 19–32)
Creatinine, Ser: 1.13 mg/dL (ref 0.50–1.35)
GFR calc Af Amer: >90 mL/min (ref >90)
GFR calc non Af Amer: 86 mL/min — ABNORMAL LOW (ref >90)
GLUCOSE: 111 mg/dL — AB (ref 70–99)
Potassium: 3.8 mEq/L (ref 3.7–5.3)
Sodium: 135 mEq/L — ABNORMAL LOW (ref 137–147)

## 2014-05-25 MED ORDER — ENSURE COMPLETE PO LIQD
237.0000 mL | Freq: Two times a day (BID) | ORAL | Status: DC
  Administered 2014-05-25 – 2014-05-27 (×4): 237 mL via ORAL

## 2014-05-25 MED ORDER — CALCIUM POLYCARBOPHIL 625 MG PO TABS
625.0000 mg | ORAL_TABLET | Freq: Two times a day (BID) | ORAL | Status: DC
  Administered 2014-05-25 – 2014-05-27 (×5): 625 mg via ORAL
  Filled 2014-05-25 (×6): qty 1

## 2014-05-25 MED ORDER — SODIUM CHLORIDE 0.9 % IJ SOLN: 10.0000 mL | INTRAMUSCULAR | Status: DC | PRN

## 2014-05-25 MED ORDER — CALCIUM POLYCARBOPHIL 625 MG PO TABS: 625.0000 mg | ORAL_TABLET | Freq: Two times a day (BID) | ORAL | Status: DC

## 2014-05-25 NOTE — Progress Notes (Signed)
7 Days Post-Op Robotic assisted LAR with coloanal anastomosis and diverting loop ileostomy Subjective:  Feels better this am.  Drank some yesterday.  Objective: Vital signs in last 24 hours: Temp:  [98 F (36.7 C)-99.1 F (37.3 C)] 98 F (36.7 C) (10/16 0622) Pulse Rate:  [87-95] 87 (10/16 0622) Resp:  [18] 18 (10/16 0622) BP: (124-128)/(73-77) 124/73 mmHg (10/16 0622) SpO2:  [99 %] 99 % (10/16 0622)   Intake/Output from previous day: 10/15 0701 - 10/16 0700 In: 3980 [P.O.:760; I.V.:2220; IV Piggyback:1000] Out: 1950 [Urine:2200; Emesis/NG output:450; DTOIZ:1245] Intake/Output this shift:   General appearance: alert and cooperative GI: normal findings: soft, non-distended ostomy: pink, viable, edematous.  2066ml out yesterday  Incision: healing well, no significant drainage, no significant erythema  Lab Results:   Recent Labs  05/25/14 0435  WBC 8.9  HGB 15.1  HCT 44.1  PLT 219   BMET  Recent Labs  05/25/14 0435  NA 135*  K 3.8  CL 95*  CO2 28  GLUCOSE 111*  BUN 15  CREATININE 1.13  CALCIUM 8.6   PT/INR No results found for this basename: LABPROT, INR,  in the last 72 hours ABG No results found for this basename: PHART, PCO2, PO2, HCO3,  in the last 72 hours  MEDS, Scheduled . enoxaparin (LOVENOX) injection  40 mg Subcutaneous Q24H  . pantoprazole  20 mg Oral Daily  . polycarbophil  625 mg Oral BID    Studies/Results: Dg Abd 1 View  05/23/2014   CLINICAL DATA:  Bilious vomiting following GI surgery. History of invasive rectal cancer.  EXAM: ABDOMEN - 1 VIEW  COMPARISON:  CT abdomen pelvis- 01/29/2014  FINDINGS: There is moderate gas distention of multiple loops of small bowel with index loop of small bowel within the left mid/ lower abdomen measuring approximately 4.4 cm in diameter. These findings are associated with a conspicuous paucity of distal colonic gas with very minimal amount of air seen within the ascending colon. No supine evidence of  pneumoperitoneum. No definite pneumatosis or portal venous gas.  No definite abnormal intra-abdominal calcifications.  No acute osseus abnormalities.  IMPRESSION: Findings worrisome for at least partial small bowel obstruction.   Electronically Signed   By: Sandi Mariscal M.D.   On: 05/23/2014 21:55    Assessment: s/p Procedure(s): Robotic assisted low anterior resection with splenic flexure mobilization, and diverting loop ileostomy Patient Active Problem List   Diagnosis Date Noted  . Rectal cancer 02/01/2014    Expected post op course, a little dehydrated this am.  Pt does not have pSBO.  Does not have air in his colon because he is diverted.    Plan: Cont IV fluids today.  Advance to soft diet Recheck cr in AM Will add fiber pills back to regimen today Cont PO pain meds Ambulate Ostomy Education  Possible d/c once tolerating a diet better Insert PICC for home IVF infusions     LOS: 7 days     .[name], [facility], [place]   05/25/2014 7:32 AM

## 2014-05-25 NOTE — Progress Notes (Signed)
Peripherally Inserted Central Catheter/Midline Placement  The IV Nurse has discussed with the patient and/or persons authorized to consent for the patient, the purpose of this procedure and the potential benefits and risks involved with this procedure.  The benefits include less needle sticks, lab draws from the catheter and patient may be discharged home with the catheter.  Risks include, but not limited to, infection, bleeding, blood clot (thrombus formation), and puncture of an artery; nerve damage and irregular heat beat.  Alternatives to this procedure were also discussed.  PICC/Midline Placement Documentation        Seth Villanueva 05/25/2014, 11:35 AM

## 2014-05-25 NOTE — Discharge Instructions (Signed)
ABDOMINAL SURGERY: POST OP INSTRUCTIONS  1. DIET: Follow a light bland diet the first 24 hours after arrival home, such as soup, liquids, crackers, etc.  Be sure to include lots of fluids daily.  Avoid fast food or heavy meals as your are more likely to get nauseated.  Do not eat any uncooked fruits or vegetables for the next 2 weeks as your colon heals. 2. Take your usually prescribed home medications unless otherwise directed. 3. PAIN CONTROL: a. Pain is best controlled by a usual combination of three different methods TOGETHER: i. Ice/Heat ii. Over the counter pain medication iii. Prescription pain medication b. Most patients will experience some swelling and bruising around the incisions.  Ice packs or heating pads (30-60 minutes up to 6 times a day) will help. Use ice for the first few days to help decrease swelling and bruising, then switch to heat to help relax tight/sore spots and speed recovery.  Some people prefer to use ice alone, heat alone, alternating between ice & heat.  Experiment to what works for you.  Swelling and bruising can take several weeks to resolve.   c. It is helpful to take an over-the-counter pain medication regularly for the first few weeks.  Choose one of the following that works best for you: i. Naproxen (Aleve, etc)  Two 243m tabs twice a day ii. Ibuprofen (Advil, etc) Three 2036mtabs four times a day (every meal & bedtime) iii. Acetaminophen (Tylenol, etc) 500-65045mour times a day (every meal & bedtime) d. A  prescription for pain medication (such as oxycodone, hydrocodone, etc) should be given to you upon discharge.  Take your pain medication as prescribed.  i. If you are having problems/concerns with the prescription medicine (does not control pain, nausea, vomiting, rash, itching, etc), please call us [phone] see if we need to switch you to a different pain medicine that will work better for you and/or control your side effect better. ii. If you  need a refill on your pain medication, please contact your pharmacy.  They will contact our office to request authorization. Prescriptions will not be filled after 5 pm or on week-ends. 4. Avoid getting constipated.  Between the surgery and the pain medications, it is common to experience some constipation.  Increasing fluid intake and taking a fiber supplement (such as Metamucil, Citrucel, FiberCon, MiraLax, etc) 1-2 times a day regularly will usually help prevent this problem from occurring.  A mild laxative (prune juice, Milk of Magnesia, MiraLax, etc) should be taken according to package directions if there are no bowel movements after 48 hours.   5. Watch out for diarrhea.  If you have many loose bowel movements, simplify your diet to bland foods & liquids for a few days.  Stop any stool softeners and decrease your fiber supplement.  Switching to mild anti-diarrheal medications (Kayopectate, Pepto Bismol) can help.  If this worsens or does not improve, please call us.[place]. Wash / shower every day.  You may shower over the incision / wound.  Avoid baths until the skin is fully healed.  Continue to shower over incision(s) after the dressing is off. 7. Remove your waterproof bandages 5 days after surgery.  You may leave the incision open to air.  You may replace a dressing/Band-Aid to cover the incision for comfort if you wish. 8. ACTIVITIES as tolerated:   a. You may resume regular (light) daily activities beginning the next day--such as daily self-care, walking, climbing stairs--gradually increasing activities as  tolerated.  If you can walk 30 minutes without difficulty, it is safe to try more intense activity such as jogging, treadmill, bicycling, low-impact aerobics, swimming, etc. b. Save the most intensive and strenuous activity for last such as sit-ups, heavy lifting, contact sports, etc  Refrain from any heavy lifting or straining until you are off narcotics for pain control.   c. DO NOT PUSH THROUGH  PAIN.  Let pain be your guide: If it hurts to do something, don't do it.  Pain is your body warning you to avoid that activity for another week until the pain goes down. d. You may drive when you are no longer taking prescription pain medication, you can comfortably wear a seatbelt, and you can safely maneuver your car and apply brakes. e. Bonita Quin may have sexual intercourse when it is comfortable.  9. FOLLOW UP in our office a. Please call [facility] at [phone] to set up an appointment to see your surgeon in the office for a follow-up appointment approximately 1-2 weeks after your surgery. b. Make sure that you call for this appointment the day you arrive home to insure a convenient appointment time. 10. IF YOU HAVE DISABILITY OR FAMILY LEAVE FORMS, BRING THEM TO THE OFFICE FOR PROCESSING.  DO NOT GIVE THEM TO YOUR DOCTOR.   WHEN TO CALL us [phone]: 1. Poor pain control 2. Reactions / problems with new medications (rash/itching, nausea, etc)  3. Fever over 101.5 F (38.5 C) 4. Inability to urinate 5. Nausea and/or vomiting 6. Worsening swelling or bruising 7. Continued bleeding from incision. 8. Increased pain, redness, or drainage from the incision  The clinic staff is available to answer your questions during regular business hours (8:30am-5pm).  Please dont hesitate to call and ask to speak to one of our nurses for clinical concerns.   A surgeon from [facility] is always on call at the hospitals   If you have a medical emergency, go to the nearest emergency room or call 911.    Ostomy  WHAT IS AN OSTOMY? An ostomy is a surgically created opening connecting an internal organ to the surface of the body. Different kinds of ostomies are named for the organ involved. The most common types of ostomies in intestinal surgery are an "ileostomy" (connecting the ileal part of the small intestine to the abdominal wall) and a "colostomy" (connecting the colon, or, large intestine  to the abdominal wall). An ostomy may be temporary or permanent. A temporary ostomy may be required if the intestinal tract can't be properly prepared for surgery because of blockage by disease or scar tissue. A temporary ostomy may also be created to allow inflammation or an operative site to heal without contamination by stool. Temporary ostomies can usually be reversed with minimal or no loss of intestinal function. A permanent ostomy may be required when disease, or its treatment, impairs normal intestinal function, or when the muscles that control elimination do not work properly or require removal. The most common causes of these conditions are low rectal cancer and inflammatory bowel disease.   An ostomy connects either the small or the large intestine to the surface of the body.   HOW WILL I CONTROL MY BOWEL MOVEMENTS? Once your ostomy has been created, your surgeon or wound ostomy continence nurse (a [facility] nurse specializes in ostomy care) will teach you to attach and care for a pouch called an ostomy appliance. An ostomy appliance, or pouch, is designed to catch eliminated fecal  material (stool). The pouch is made of plastic and is held to the body with an adhesive. The adhesive, in turn, protects the skin from moisture. The pouch is disposable and is emptied or changed as needed. The system is quite secure; "accidents" are not common, and the pouches are odor-free. Your bowel movements will naturally empty into the pouch. The frequency and quantity of your bowel movements will vary, depending on the type of ostomy you have, your diet, and your bowel habits prior to surgery. You may be instructed to modify your eating habits in order to control the frequency and consistency of your bowel movements. If the ostomy is a colostomy, irrigation techniques may be learned which allow for increased control over the timing of bowel movements.     An ostomy appliance is a plastic pouch, held to the body with  an adhesive skin barrier, that provides secure and odor-free control of bowel movements.   WILL OTHER PEOPLE KNOW THAT I HAVE AN OSTOMY? Not unless you tell them. An ostomy is easily hidden by your usual clothing. You probably have met people with an ostomy and not realized it! WHERE WILL THE OSTOMY BE? An ostomy is best placed on a flat portion of the abdominal wall. Before undergoing surgery to create an ostomy, it is best for your surgeon or [facility] nurse to mark an appropriate place on your abdominal wall not constricted by your belt-line. A colostomy is usually placed to the left of your navel and an ileostomy to the right. WILL MY PHYSICAL ACTIVITIES BE LIMITED? The answer to this question is usually no. Public figures, prominent entertainers, and even professional athletes have ostomies that do not significantly limit their activities. All your usual activities, including active sports, may be resumed once healing from surgery is complete. WILL AN OSTOMY AFFECT MY SEX LIFE? Most patients with ostomies resume their usual sexual activity. Many people with ostomies worry about how their sexual partner will think of them because of their appliance. This perceived change in one's body image can be overcome by a strong relationship, time and patience. Support groups are also available in many cities. WHAT ARE THE COMPLICATIONS OF AN OSTOMY? Complications from an ostomy can occur. Most, like local skin irritation are typically minor and can be easily remedied. Problems such as a hernia associated with the ostomy or prolapse of the ostomy (a protrusion of the bowel) occasionally require surgery if they cause significant symptoms. Weight loss or gain may affect the function of an ostomy. Living with an ostomy will require some adjustments and learning, but an active and fulfilling life is still possible and likely. Your colon and rectal surgeon and [facility] nurse will provide you with skills and support to help you  better live with your ostomy. WOULD YOU LIKE ADDITIONAL INFORMATION? More information about ostomies can be found at: www.uoaa.org - [employer] [place] [employer] [place] WHAT IS A COLON AND RECTAL SURGEON? Colon and rectal surgeons are experts in the surgical and non-surgical treatment of diseases of the colon, rectum and anus. They have completed advanced surgical training in the treatment of these diseases as well as full general surgical training. Board-certified colon and rectal surgeons complete residencies in general surgery and colon and rectal surgery, and pass intensive examinations conducted by the American Board of Surgery and the American Board of Colon and Rectal Surgery. They are well-versed in the treatment of both benign and malignant diseases of the colon, rectum and anus and are able  to perform routine screening examinations and surgically treat conditions if indicated to do so.  2012 American Society of Colon & Rectal Surgeons [facility], [place], [place], [place], [place]  [address] ? MAIN: [phone] ? TOLL FREE: [phone] ? FAX [phone] [email or web address]

## 2014-05-25 NOTE — Consult Note (Signed)
WOC ostomy follow up Stoma type/location: RLQ Ileostomy Stomal assessment/size: 1 1/4" well budded, pink stoma.  Peristomal assessment: Laprascopic site at 9 o'clock has been leaking clear, serous fluid.  Dry dressing applied to this site.   Treatment options for stomal/peristomal skin:  Dry dressing to leaking laprascopic site PRN.  Output Heavy, liquid green stool.  Ostomy pouching: 1pc.convex pouch.  No barrier ring needed.   Education provided: Patient demonstrated measuring and cutting 1 piece pouch and application.  Independent with emptying and roll closure.  Both parents at bedside and participated and asked appropriate questions.   Enrolled patient in [place] Start Discharge program: Yes Will not follow at this time.  Please re-consult if needed.  Domenic Moras RN BSN [facility] Pager [phone]

## 2014-05-26 MED ORDER — LOPERAMIDE HCL 2 MG PO CAPS
2.0000 mg | ORAL_CAPSULE | ORAL | Status: DC | PRN
  Administered 2014-05-26 (×2): 2 mg via ORAL
  Filled 2014-05-26 (×2): qty 1

## 2014-05-26 NOTE — Progress Notes (Signed)
Patient ID: Seth Casey., male   DOB: [birth year], 33 y.o.   MRN: [id number] 8 Days Post-Op  Subjective: Feels much better today. He feels that he is rehydrated. Denies abdominal pain or nausea.  Objective: Vital signs in last 24 hours: Temp:  [97.5 F (36.4 C)-98.7 F (37.1 C)] 98.1 F (36.7 C) (10/17 0538) Pulse Rate:  [77-91] 77 (10/17 0538) Resp:  [16-20] 16 (10/17 0538) BP: (108-130)/(58-79) 108/58 mmHg (10/17 0538) SpO2:  [98 %-100 %] 98 % (10/17 0538) Last BM Date: 05/17/14  Intake/Output from previous day: 10/16 0701 - 10/17 0700 In: 2200 [P.O.:600; I.V.:1600] Out: 4065 [Urine:1290; Stool:2775] Intake/Output this shift:    General appearance: alert, cooperative and no distress GI: normal findings: soft, non-tender Incision/Wound: clean and dry  Lab Results:   Recent Labs  05/25/14 0435  WBC 8.9  HGB 15.1  HCT 44.1  PLT 219   BMET  Recent Labs  05/25/14 0435  NA 135*  K 3.8  CL 95*  CO2 28  GLUCOSE 111*  BUN 15  CREATININE 1.13  CALCIUM 8.6     Studies/Results: No results found.  Anti-infectives: Anti-infectives   Start     Dose/Rate Route Frequency Ordered Stop   05/18/14 2000  cefoTEtan (CEFOTAN) 2 g in dextrose 5 % 50 mL IVPB     2 g 100 mL/hr over 30 Minutes Intravenous Every 12 hours 05/18/14 1436 05/18/14 2059   05/18/14 0509  cefoTEtan (CEFOTAN) 2 g in dextrose 5 % 50 mL IVPB     2 g 100 mL/hr over 30 Minutes Intravenous On call to O.R. 05/18/14 0509 05/18/14 0800      Assessment/Plan: s/p Procedure(s): Robotic assisted low anterior resection with splenic flexure mobilization, and diverting loop ileostomy Ileostomy output began high, 2.5 L yesterday and almost 2 L since early this morning. Keeping up with IV fluids and oral intake. Otherwise doing well. Restart Imodium cautiously and follow ostomy output    LOS: 8 days    Cherise Fedder T 05/26/2014

## 2014-05-27 MED ORDER — DIPHENOXYLATE-ATROPINE 2.5-0.025 MG PO TABS: 1.0000 | ORAL_TABLET | Freq: Four times a day (QID) | ORAL | Status: DC | PRN

## 2014-05-27 MED ORDER — SODIUM CHLORIDE 0.9 % IJ SOLN: 10.0000 mL | Freq: Two times a day (BID) | INTRAMUSCULAR | Status: DC

## 2014-05-27 MED ORDER — HEPARIN SOD (PORK) LOCK FLUSH 100 UNIT/ML IV SOLN
250.0000 [IU] | INTRAVENOUS | Status: AC | PRN
  Administered 2014-05-27: 250 [IU]

## 2014-05-27 MED ORDER — MAGIC MOUTHWASH
15.0000 mL | Freq: Four times a day (QID) | ORAL | Status: DC
  Administered 2014-05-27: 15 mL via ORAL
  Filled 2014-05-27 (×4): qty 15

## 2014-05-27 MED ORDER — SODIUM CHLORIDE 0.9 % IJ SOLN
10.0000 mL | INTRAMUSCULAR | Status: DC | PRN
  Administered 2014-05-27: 10 mL

## 2014-05-27 MED ORDER — MAGIC MOUTHWASH: 15.0000 mL | Freq: Four times a day (QID) | ORAL | Status: DC

## 2014-05-27 MED ORDER — OXYCODONE HCL 5 MG PO TABS: 5.0000 mg | ORAL_TABLET | ORAL | Status: DC | PRN

## 2014-05-27 NOTE — Plan of Care (Signed)
Problem: Phase II Progression Outcomes Goal: IV changed to normal saline lock Outcome: Progressing Still receiving IVF.  Improving PO intake.  Increased urinary and ileostomy output.

## 2014-05-27 NOTE — Progress Notes (Signed)
CARE MANAGEMENT NOTE 05/27/2014  Patient:  Seth Villanueva, Seth Villanueva   Account Number:  [id number]  Date Initiated:  05/23/2014  Documentation initiated by:  Sunday Spillers  Subjective/Objective Assessment:   33 yo male admitted s/p LARcolostomy. PTA lived at home with mother     Action/Plan:   Home when stable   Anticipated DC Date:  05/24/2014   Anticipated DC Plan:  [place]  CM consult      [facility] Choice  HOME HEALTH   Choice offered to / List presented to:  C-1 Patient        [place] arranged  HH-1 RN  [place].   Status of service:  Completed, signed off Medicare Important Message given?   (If response is "NO", the following Medicare IM given date fields will be blank) Date Medicare IM given:   Medicare IM given by:   Date Additional Medicare IM given:   Additional Medicare IM given by:    Discharge Disposition:  HOME/SELF CARE  Per UR Regulation:  Reviewed for med. necessity/level of care/duration of stay  If discussed at [place] of Stay Meetings, dates discussed:    Comments:  05/27/2014 1520 NCM contacted [facility] to make aware of dc home with HH. Jonnie Finner RN CCM Case Mgmt phone [phone]

## 2014-05-27 NOTE — Discharge Summary (Signed)
Reviewed discharge instructions with patient, mother and father including medications, follow up appointments, ostomy care, dressing changes, skin inspection, dietary concerns and s/s for which to contact MD.  Family with extensive questions which were all addressed.  Pt/family verbalized good understanding of all topics addressed.  Pt with father's assistance able to demonstrate independent care of pouch.  Father independent in dressing changes for old JP site.  Sent pt home with dressing change supplies.  Mother said they had been given an ostomy "kit" with supplies.  Home health nurse to follow up.  Pt being d/c into care of family.

## 2014-05-27 NOTE — Progress Notes (Signed)
Patient ID: Seth Villanueva., male   DOB: [birth year], 33 y.o.   MRN: [id number] 9 Days Post-Op  Subjective: No major complaints. Has noted some coating of his tongue. Ostomy output is down and more formed.  Objective: Vital signs in last 24 hours: Temp:  [97.7 F (36.5 C)-98.5 F (36.9 C)] 98.1 F (36.7 C) (10/18 0536) Pulse Rate:  [78-83] 78 (10/18 0536) Resp:  [18] 18 (10/18 0536) BP: (114-140)/(51-81) 120/62 mmHg (10/18 0536) SpO2:  [98 %-100 %] 100 % (10/18 0536) Last BM Date: 05/26/14  Intake/Output from previous day: 10/17 0701 - 10/18 0700 In: 5680 [P.O.:2480; I.V.:3200] Out: 5075 [Urine:3250; Stool:1825] Intake/Output this shift: Total I/O In: -  Out: 550 [Urine:550]  General appearance: alert, cooperative and no distress GI: normal findings: soft, non-tender Incision/Wound: clean and dry  Lab Results:   Recent Labs  05/25/14 0435  WBC 8.9  HGB 15.1  HCT 44.1  PLT 219   BMET  Recent Labs  05/25/14 0435  NA 135*  K 3.8  CL 95*  CO2 28  GLUCOSE 111*  BUN 15  CREATININE 1.13  CALCIUM 8.6     Studies/Results: No results found.  Anti-infectives: Anti-infectives   Start     Dose/Rate Route Frequency Ordered Stop   05/18/14 2000  cefoTEtan (CEFOTAN) 2 g in dextrose 5 % 50 mL IVPB     2 g 100 mL/hr over 30 Minutes Intravenous Every 12 hours 05/18/14 1436 05/18/14 2059   05/18/14 0509  cefoTEtan (CEFOTAN) 2 g in dextrose 5 % 50 mL IVPB     2 g 100 mL/hr over 30 Minutes Intravenous On call to O.R. 05/18/14 0509 05/18/14 0800      Assessment/Plan: s/p Procedure(s): Robotic assisted low anterior resection with splenic flexure mobilization, and diverting loop ileostomy Doing well today. Ostomy output M managed. Magic mouthwash for tongue coating Okay for discharge with home health for monitoring and ostomy care   LOS: 9 days    Donjuan Robison T 05/27/2014

## 2014-05-27 NOTE — Plan of Care (Signed)
Problem: Phase II Progression Outcomes Goal: IV changed to normal saline lock Outcome: Not Applicable Date Met:  23/70/23 Pt being d/c to home with PICC/

## 2014-05-27 NOTE — Plan of Care (Signed)
Problem: Phase II Progression Outcomes Goal: Tolerating diet Outcome: Progressing Eating regular diet, but eating meals over sustained period (a few hours); mild nausea controlled with Zofran.

## 2014-05-27 NOTE — Plan of Care (Signed)
Problem: Consults Goal: Colostomy/Ileostomy Education (See Patient Education module for education specifics.)  Outcome: Completed/Met Date Met:  05/27/14 Pt/family able to demonstrate pouch change independently.  Pt emtying pouch and recording contents.

## 2014-05-27 NOTE — Plan of Care (Signed)
Problem: Discharge Progression Outcomes Goal: Activity appropriate for discharge plan Outcome: Progressing Able to ambulate in halls today.

## 2014-05-28 ENCOUNTER — Other Ambulatory Visit (HOSPITAL_BASED_OUTPATIENT_CLINIC_OR_DEPARTMENT_OTHER): Payer: 59

## 2014-05-28 ENCOUNTER — Ambulatory Visit (HOSPITAL_BASED_OUTPATIENT_CLINIC_OR_DEPARTMENT_OTHER): Payer: 59 | Admitting: Oncology

## 2014-05-28 ENCOUNTER — Telehealth: Payer: Self-pay | Admitting: Oncology

## 2014-05-28 ENCOUNTER — Telehealth: Payer: Self-pay | Admitting: *Deleted

## 2014-05-28 ENCOUNTER — Encounter (HOSPITAL_COMMUNITY): Payer: Self-pay | Admitting: *Deleted

## 2014-05-28 ENCOUNTER — Encounter (HOSPITAL_COMMUNITY): Payer: Self-pay

## 2014-05-28 VITALS — BP 132/69 | HR 88 | Temp 97.6°F | Resp 18 | Ht 73.0 in | Wt 155.3 lb

## 2014-05-28 DIAGNOSIS — C20 Malignant neoplasm of rectum: Secondary | ICD-10-CM

## 2014-05-28 LAB — COMPREHENSIVE METABOLIC PANEL (CC13)
ALK PHOS: 90 U/L (ref 40–150)
ALT: 35 U/L (ref 0–55)
AST: 13 U/L (ref 5–34)
Albumin: 2.6 g/dL — ABNORMAL LOW (ref 3.5–5.0)
Anion Gap: 7 mEq/L (ref 3–11)
BUN: 9.6 mg/dL (ref 7.0–26.0)
CO2: 29 mEq/L (ref 22–29)
CREATININE: 0.8 mg/dL (ref 0.7–1.3)
Calcium: 8.9 mg/dL (ref 8.4–10.4)
Chloride: 104 mEq/L (ref 98–109)
Glucose: 123 mg/dl (ref 70–140)
POTASSIUM: 4 meq/L (ref 3.5–5.1)
Sodium: 139 mEq/L (ref 136–145)
Total Bilirubin: 0.29 mg/dL (ref 0.20–1.20)
Total Protein: 5.8 g/dL — ABNORMAL LOW (ref 6.4–8.3)

## 2014-05-28 LAB — CBC WITH DIFFERENTIAL/PLATELET
BASO%: 0.7 % (ref 0.0–2.0)
BASOS ABS: 0.1 10*3/uL (ref 0.0–0.1)
EOS%: 2.4 % (ref 0.0–7.0)
Eosinophils Absolute: 0.2 10*3/uL (ref 0.0–0.5)
HCT: 39.8 % (ref 38.4–49.9)
HEMOGLOBIN: 13.4 g/dL (ref 13.0–17.1)
LYMPH%: 6.9 % — ABNORMAL LOW (ref 14.0–49.0)
MCH: 32.4 pg (ref 27.2–33.4)
MCHC: 33.7 g/dL (ref 32.0–36.0)
MCV: 96.1 fL (ref 79.3–98.0)
MONO#: 0.7 10*3/uL (ref 0.1–0.9)
MONO%: 9.6 % (ref 0.0–14.0)
NEUT#: 6.2 10*3/uL (ref 1.5–6.5)
NEUT%: 80.4 % — ABNORMAL HIGH (ref 39.0–75.0)
Platelets: 249 10*3/uL (ref 140–400)
RBC: 4.14 10*6/uL — ABNORMAL LOW (ref 4.20–5.82)
RDW: 13.4 % (ref 11.0–14.6)
WBC: 7.7 10*3/uL (ref 4.0–10.3)
lymph#: 0.5 10*3/uL — ABNORMAL LOW (ref 0.9–3.3)

## 2014-05-28 MED ORDER — LIDOCAINE-PRILOCAINE 2.5-2.5 % EX CREA: 1.0000 "application " | TOPICAL_CREAM | CUTANEOUS | Status: DC | PRN

## 2014-05-28 NOTE — Telephone Encounter (Signed)
Per staff message and POF I have scheduled appts. Advised scheduler of appts. JMW  

## 2014-05-28 NOTE — Progress Notes (Signed)
[facility] [facility] OFFICE PROGRESS NOTE   Diagnosis: Rectal cancer  INTERVAL HISTORY:   He underwent a robotic-assisted low anterior resection and coloanal anastomosis with a diverting loop ileostomy 05/18/2014. There was no evidence of metastatic disease. The tumor was located posteriorly distal to the coccyx.  The pathology ([id number]) confirmed invasive adenocarcinoma involving the muscularis propria with associated extracellular mucin. 4 of 15 lymph nodes were positive for metastatic carcinoma. The resection margins were negative. The tumor was moderately differentiated and arose in a tubular adenoma. Lymphovascular invasion was present. No perineural invasion. No tumor deposits. No macroscopic tumor perforation. Mimatch repair protein expression was intact. A minimal neoadjuvant treatment effect was noted.  He reports about nausea and abdominal pain when he was placed on medication to help with a high output ileostomy. He has felt better over the past few days and is tolerating a diet. He was discharged to home 05/27/2014. The ileostomy output has not been excessive.  Objective:  Vital signs in last 24 hours:  Blood pressure 132/69, pulse 88, temperature 97.6 F (36.4 C), temperature source Oral, resp. rate 18, height _0  (1.854 m), weight 155 lb 4.8 oz (70.444 kg).    HEENT: Mild whitecoat over the tongue Resp: Lungs clear bilaterally Cardio: Regular in rhythm GI: No hepatomegaly, right lower quadrant ileostomy with semi-formed stool. Nontender. Healed laparoscopy incisions. Minimal opening at the drain site in the right lower abdomen Vascular: No leg edema   Portacath/PICC-without erythema  Lab Results:  Lab Results  Component Value Date   WBC 7.7 05/28/2014   HGB 13.4 05/28/2014   HCT 39.8 05/28/2014   MCV 96.1 05/28/2014   PLT 249 05/28/2014   NEUTROABS 6.2 05/28/2014   Potassium 4.0, creatinine 0.8  Medications: I have reviewed the patient's current  medications.  Assessment/Plan: 1. Rectal cancer, clinical stage III, distal rectal mass-approximate 2 cm from the anal verge, status post an endoscopic biopsy 01/30/2014 confirming an invasive adenocarcinoma, microsatellite stable CTs of the chest, abdomen, and pelvis with no evidence of metastatic disease, malignant-appearing perirectal lymph nodes on the abdomen/pelvis CT 01/29/2014  EUS 01/30/2014 confirmed a uT3,uN2 lesion  Initiation of radiation and concurrent Xeloda 02/19/2014. Completion of radiation and concurrent Xeloda 03/28/2014. Low anterior resection, diverting loop ileostomy, and coloanal anastomosis 05/18/2014,ypT2,ypN2, no loss of mismatch repair protein expression 2. History Rectal pain secondary to #1.   Disposition:  Seth Villanueva is recovering from the low anterior resection and ileostomy. He has been diagnosed with pathologic stage III rectal cancer. I reviewed the details of the surgical pathology report, prognosis, and adjuvant treatment options with Seth Villanueva and his family. He understands there is a significant chance of developing recurrent rectal cancer over the next several years. I recommend completing a course of adjuvant chemotherapy. I explained the lack of randomized data to confirm a benefit with the addition of oxaliplatin in this setting. However I recommend adjuvant 5-FU/oxaliplatin therapy based on the colon cancer literature. We discussed CAPOX and FOLFOX. He was treated with Xeloda in the neoadjuvant setting. We decided to proceed with adjuvant FOLFOX.  I reviewed the potential toxicities associated with this regimen including the chance for nausea/vomiting, alopecia, mucositis, diarrhea, and hematologic toxicity. We discussed the hand/foot syndrome and hyperpigmentation associated with 5-fluorouracil. We reviewed the various types of neuropathy and allergic reaction seen with oxaliplatin. He agrees to proceed. We also discussed fertility issues as the adjuvant  therapy could lead to infertility . He acknowledged discussing this prior to neoadjuvant therapy. He  will let us know if he desires to pursue sperm banking  prior to treatment.  He is scheduled for placement of a Port-A-Cath 05/31/2014. Seth Villanueva will be scheduled for a first cycle of chemotherapy 06/11/2014. He will be referred to the genetics counselor.  Approximately 40 minutes were spent with patient today. The majority of the time was used for counseling and coordination of care.  Betsy Coder, MD  05/28/2014  12:00 PM

## 2014-05-28 NOTE — Telephone Encounter (Signed)
Pt confirmed labs/ov per 10/19 POF,  Sent msg to add chemo and added genetics to sch, gave pt AVS.... KJ

## 2014-05-31 ENCOUNTER — Ambulatory Visit (HOSPITAL_COMMUNITY)
Admission: RE | Admit: 2014-05-31 | Discharge: 2014-05-31 | Disposition: A | Discharge status: Home / Self Care | Payer: 59 | Source: Ambulatory Visit | Attending: General Surgery | Admitting: General Surgery

## 2014-05-31 ENCOUNTER — Ambulatory Visit (HOSPITAL_COMMUNITY): Payer: 59

## 2014-05-31 ENCOUNTER — Ambulatory Visit (HOSPITAL_COMMUNITY): Payer: 59 | Admitting: Anesthesiology

## 2014-05-31 ENCOUNTER — Encounter (HOSPITAL_COMMUNITY): Payer: 59 | Admitting: Anesthesiology

## 2014-05-31 ENCOUNTER — Encounter (HOSPITAL_COMMUNITY): Payer: Self-pay | Admitting: *Deleted

## 2014-05-31 ENCOUNTER — Encounter (HOSPITAL_COMMUNITY)
Admission: RE | Disposition: A | Discharge status: Home / Self Care | Payer: Self-pay | Source: Ambulatory Visit | Attending: General Surgery

## 2014-05-31 DIAGNOSIS — Z923 Personal history of irradiation: Secondary | ICD-10-CM | POA: Insufficient documentation

## 2014-05-31 DIAGNOSIS — Z87891 Personal history of nicotine dependence: Secondary | ICD-10-CM | POA: Diagnosis not present

## 2014-05-31 DIAGNOSIS — Z9221 Personal history of antineoplastic chemotherapy: Secondary | ICD-10-CM | POA: Diagnosis not present

## 2014-05-31 DIAGNOSIS — Z79899 Other long term (current) drug therapy: Secondary | ICD-10-CM | POA: Insufficient documentation

## 2014-05-31 DIAGNOSIS — C20 Malignant neoplasm of rectum: Secondary | ICD-10-CM

## 2014-05-31 DIAGNOSIS — Z452 Encounter for adjustment and management of vascular access device: Secondary | ICD-10-CM

## 2014-05-31 HISTORY — PX: PORTACATH PLACEMENT: SHX2246

## 2014-05-31 SURGERY — INSERTION, TUNNELED CENTRAL VENOUS DEVICE, WITH PORT
Anesthesia: General | Site: Chest | Laterality: Left

## 2014-05-31 MED ORDER — BUPIVACAINE-EPINEPHRINE (PF) 0.25% -1:200000 IJ SOLN
INTRAMUSCULAR | Status: DC | PRN
  Administered 2014-05-31: 15 mL

## 2014-05-31 MED ORDER — MIDAZOLAM HCL 2 MG/2ML IJ SOLN
INTRAMUSCULAR | Status: AC
  Filled 2014-05-31: qty 2

## 2014-05-31 MED ORDER — ONDANSETRON HCL 4 MG/2ML IJ SOLN: 4.0000 mg | Freq: Once | INTRAMUSCULAR | Status: DC | PRN

## 2014-05-31 MED ORDER — OXYCODONE HCL 5 MG PO TABS: 5.0000 mg | ORAL_TABLET | ORAL | Status: DC | PRN

## 2014-05-31 MED ORDER — ACETAMINOPHEN 650 MG RE SUPP
650.0000 mg | RECTAL | Status: DC | PRN
  Filled 2014-05-31: qty 1

## 2014-05-31 MED ORDER — SODIUM CHLORIDE 0.9 % IJ SOLN: 3.0000 mL | Freq: Two times a day (BID) | INTRAMUSCULAR | Status: DC

## 2014-05-31 MED ORDER — 0.9 % SODIUM CHLORIDE (POUR BTL) OPTIME
TOPICAL | Status: DC | PRN
  Administered 2014-05-31: 1000 mL

## 2014-05-31 MED ORDER — MIDAZOLAM HCL 5 MG/5ML IJ SOLN
INTRAMUSCULAR | Status: DC | PRN
  Administered 2014-05-31: 2 mg via INTRAVENOUS

## 2014-05-31 MED ORDER — SODIUM CHLORIDE 0.9 % IJ SOLN: 3.0000 mL | INTRAMUSCULAR | Status: DC | PRN

## 2014-05-31 MED ORDER — BUPIVACAINE-EPINEPHRINE (PF) 0.25% -1:200000 IJ SOLN
INTRAMUSCULAR | Status: AC
  Filled 2014-05-31: qty 30

## 2014-05-31 MED ORDER — HEPARIN SOD (PORK) LOCK FLUSH 100 UNIT/ML IV SOLN
INTRAVENOUS | Status: DC | PRN
  Administered 2014-05-31: 500 [IU]

## 2014-05-31 MED ORDER — PROPOFOL 10 MG/ML IV BOLUS
INTRAVENOUS | Status: AC
  Filled 2014-05-31: qty 20

## 2014-05-31 MED ORDER — CEFAZOLIN SODIUM-DEXTROSE 2-3 GM-% IV SOLR
2.0000 g | INTRAVENOUS | Status: AC
  Administered 2014-05-31: 2 g via INTRAVENOUS

## 2014-05-31 MED ORDER — HEPARIN SOD (PORK) LOCK FLUSH 100 UNIT/ML IV SOLN
INTRAVENOUS | Status: AC
  Filled 2014-05-31: qty 5

## 2014-05-31 MED ORDER — FENTANYL CITRATE 0.05 MG/ML IJ SOLN
INTRAMUSCULAR | Status: AC
  Filled 2014-05-31: qty 2

## 2014-05-31 MED ORDER — LACTATED RINGERS IV SOLN
INTRAVENOUS | Status: DC | PRN
  Administered 2014-05-31 (×2): via INTRAVENOUS

## 2014-05-31 MED ORDER — CEFAZOLIN SODIUM-DEXTROSE 2-3 GM-% IV SOLR
INTRAVENOUS | Status: AC
  Filled 2014-05-31: qty 50

## 2014-05-31 MED ORDER — LIDOCAINE HCL (CARDIAC) 20 MG/ML IV SOLN
INTRAVENOUS | Status: DC | PRN
  Administered 2014-05-31: 100 mg via INTRAVENOUS

## 2014-05-31 MED ORDER — DEXAMETHASONE SODIUM PHOSPHATE 10 MG/ML IJ SOLN
INTRAMUSCULAR | Status: DC | PRN
  Administered 2014-05-31: 10 mg via INTRAVENOUS

## 2014-05-31 MED ORDER — FENTANYL CITRATE 0.05 MG/ML IJ SOLN: 25.0000 ug | INTRAMUSCULAR | Status: DC | PRN

## 2014-05-31 MED ORDER — SODIUM CHLORIDE 0.9 % IV SOLN: 250.0000 mL | INTRAVENOUS | Status: DC | PRN

## 2014-05-31 MED ORDER — FENTANYL CITRATE 0.05 MG/ML IJ SOLN
INTRAMUSCULAR | Status: DC | PRN
Start: 2014-05-31 — End: 2014-05-31
  Administered 2014-05-31 (×2): 50 ug via INTRAVENOUS

## 2014-05-31 MED ORDER — ONDANSETRON HCL 4 MG/2ML IJ SOLN
INTRAMUSCULAR | Status: DC | PRN
  Administered 2014-05-31: 4 mg via INTRAVENOUS

## 2014-05-31 MED ORDER — ONDANSETRON HCL 4 MG/2ML IJ SOLN
INTRAMUSCULAR | Status: AC
  Filled 2014-05-31: qty 2

## 2014-05-31 MED ORDER — PROPOFOL 10 MG/ML IV BOLUS
INTRAVENOUS | Status: DC | PRN
  Administered 2014-05-31: 200 mg via INTRAVENOUS

## 2014-05-31 MED ORDER — ACETAMINOPHEN 325 MG PO TABS: 650.0000 mg | ORAL_TABLET | ORAL | Status: DC | PRN

## 2014-05-31 MED ORDER — DEXAMETHASONE SODIUM PHOSPHATE 10 MG/ML IJ SOLN
INTRAMUSCULAR | Status: AC
  Filled 2014-05-31: qty 1

## 2014-05-31 MED ORDER — SODIUM CHLORIDE 0.9 % IR SOLN
Freq: Once | Status: AC
  Administered 2014-05-31: 14:00:00
  Filled 2014-05-31: qty 1.2

## 2014-05-31 SURGICAL SUPPLY — 30 items
BAG DECANTER FOR FLEXI CONT (MISCELLANEOUS) ×3 IMPLANT
BLADE SURG SZ11 CARB STEEL (BLADE) ×3 IMPLANT
CHLORAPREP W/TINT 26ML (MISCELLANEOUS) ×3 IMPLANT
DECANTER SPIKE VIAL GLASS SM (MISCELLANEOUS) ×3 IMPLANT
DERMABOND ADVANCED (GAUZE/BANDAGES/DRESSINGS) ×2
DERMABOND ADVANCED .7 DNX12 (GAUZE/BANDAGES/DRESSINGS) ×1 IMPLANT
DRAPE C-ARM 42X120 X-RAY (DRAPES) ×3 IMPLANT
DRAPE LAPAROTOMY TRNSV 102X78 (DRAPE) ×3 IMPLANT
ELECT REM PT RETURN 9FT ADLT (ELECTROSURGICAL) ×3
ELECTRODE REM PT RTRN 9FT ADLT (ELECTROSURGICAL) ×1 IMPLANT
GAUZE SPONGE 4X4 16PLY XRAY LF (GAUZE/BANDAGES/DRESSINGS) ×3 IMPLANT
GLOVE BIO SURGEON STRL SZ 6.5 (GLOVE) ×2 IMPLANT
GLOVE BIO SURGEONS STRL SZ 6.5 (GLOVE) ×1
GLOVE BIOGEL PI IND STRL 7.0 (GLOVE) ×1 IMPLANT
GLOVE BIOGEL PI INDICATOR 7.0 (GLOVE) ×2
GOWN STRL REUS W/TWL 2XL LVL3 (GOWN DISPOSABLE) ×3 IMPLANT
GOWN STRL REUS W/TWL XL LVL3 (GOWN DISPOSABLE) ×3 IMPLANT
KIT BASIN OR (CUSTOM PROCEDURE TRAY) ×3 IMPLANT
KIT PORT POWER 8FR ISP CVUE (Catheter) ×3 IMPLANT
NEEDLE HYPO 25X1 1.5 SAFETY (NEEDLE) ×3 IMPLANT
PACK BASIC VI WITH GOWN DISP (CUSTOM PROCEDURE TRAY) ×3 IMPLANT
PENCIL BUTTON HOLSTER BLD 10FT (ELECTRODE) ×3 IMPLANT
SUT PROLENE 2 0 SH DA (SUTURE) ×3 IMPLANT
SUT VIC AB 3-0 SH 27 (SUTURE) ×2
SUT VIC AB 3-0 SH 27XBRD (SUTURE) ×1 IMPLANT
SUT VIC AB 4-0 PS2 18 (SUTURE) ×3 IMPLANT
SYR CONTROL 10ML LL (SYRINGE) ×3 IMPLANT
SYRINGE 10CC LL (SYRINGE) ×3 IMPLANT
TOWEL OR 17X26 10 PK STRL BLUE (TOWEL DISPOSABLE) ×3 IMPLANT
TOWEL OR NON WOVEN STRL DISP B (DISPOSABLE) ×3 IMPLANT

## 2014-05-31 NOTE — Interval H&P Note (Signed)
History and Physical Interval Note:  05/31/2014 1:06 PM  Seth Villanueva.  has presented today for surgery, with the diagnosis of 3 rectal cancer  The various methods of treatment have been discussed with the patient and family. After consideration of risks, benefits and other options for treatment, the patient has consented to  Procedure(s): INSERTION PORT-A-CATH (N/A) as a surgical intervention .  The patient's history has been reviewed, patient examined, no change in status, stable for surgery.  I have reviewed the patient's chart and labs.  Questions were answered to the patient's satisfaction.   Pt has stage 3 rectal cancer.  IV chemotherapy has been recommended.  Risks include bleeding, infection, pneumothorax and device malfunction.     [name], MD  [facility]

## 2014-05-31 NOTE — H&P (View-Only) (Signed)
Seth Villanueva B. Seth Villanueva 04/25/2014 3:58 PM Location: Seth Villanueva Patient #: [id number] DOB: [birth year] Single / Language: Seth Villanueva / Race: White Male  History of Present Illness Seth Ruff MD; 10/05/3333 4:29 PM) Patient words: 4 week reck preop appointment.  The patient is a 33 year old male who presents with a complaint of rectal cancer. Patient is a 33 year old male who underwent a prolonged workup for lower rectal bleeding. He ultimately underwent a colonoscopy which showed an adenocarcinoma the distal rectum. EUS confirmed a T3 N2 rectal cancer. He underwent neoadjuvant chemotherapy and radiation. Currently his weight is stable. He is having regular bowel movements. His pain is under control. He did well with chemotherapy and radiation.   Other Problems Seth Villanueva, [place]; 04/25/2014 3:58 PM) Hemorrhoids Rectal Cancer  Past Surgical History Seth Villanueva, [place]; 04/25/2014 3:58 PM) No pertinent past surgical history  Diagnostic Studies History Seth Villanueva, [place]; 04/25/2014 3:58 PM) Colonoscopy within last year  Allergies Seth Laster, MA; 04/25/2014 3:59 PM) Azithromycin *CHEMICALS* Hives. Compazine *ANTIPSYCHOTICS/ANTIMANIC AGENTS* anxiety  Medication History Seth Villanueva, [place]; 04/25/2014 4:01 PM) OxyCODONE HCl (5MG  Tablet, Oral prn) Active. Hydrocortisone Acetate (25MG  Suppository, Rectal prn) Active. Polyethylene Glycol 3350 (Oral prn) Active. Acetaminophen (325MG  Tablet, Oral prn) Active. Ibuprofen (200MG  Capsule, Oral prn) Active. Imodium A-D (2MG  Tablet, Oral prn) Active. Sudafed (30MG  Tablet, Oral prn) Active. Mylicon (40MG /0.6ML Suspension, Oral prn) Active.  Social History Seth Villanueva [place], [place]; 04/25/2014 3:58 PM) Alcohol use Occasional alcohol use. No caffeine use No drug use Tobacco use Former smoker.  Family History Seth Villanueva [place], [place]; 04/25/2014 3:58 PM) Arthritis Family Members In General. Breast Cancer Family  Members In General. Heart Disease Family Members In General. Heart disease in male family member before age 76 Heart disease in male family member before age 20 Hypertension Family Members In General. Thyroid problems Family Members In General.  Review of Systems Seth Villanueva [place] MA; 04/25/2014 3:58 PM) General Not Present- Appetite Loss, Chills, Fatigue, Fever, Night Sweats, Weight Gain and Weight Loss. Skin Not Present- Change in Wart/Mole, Dryness, Hives, Jaundice, New Lesions, Non-Healing Wounds, Rash and Ulcer. HEENT Not Present- Earache, Hearing Loss, Hoarseness, Nose Bleed, Oral Ulcers, Ringing in the Ears, Seasonal Allergies, Sinus Pain, Sore Throat, Visual Disturbances, Wears glasses/contact lenses and Yellow Eyes. Respiratory Not Present- Bloody sputum, Chronic Cough, Difficulty Breathing, Snoring and Wheezing. Breast Not Present- Breast Mass, Breast Pain, Nipple Discharge and Skin Changes. Cardiovascular Not Present- Chest Pain, Difficulty Breathing Lying Down, Leg Cramps, Palpitations, Rapid Heart Rate, Shortness of Breath and Swelling of Extremities. Gastrointestinal Present- Bloating and Hemorrhoids. Not Present- Abdominal Pain, Bloody Stool, Change in Bowel Habits, Chronic diarrhea, Constipation, Difficulty Swallowing, Excessive gas, Gets full quickly at meals, Indigestion, Nausea, Rectal Pain and Vomiting. Male Genitourinary Not Present- Blood in Urine, Change in Urinary Stream, Frequency, Impotence, Nocturia, Painful Urination, Urgency and Urine Leakage. Musculoskeletal Not Present- Back Pain, Joint Pain, Joint Stiffness, Muscle Pain, Muscle Weakness and Swelling of Extremities. Neurological Not Present- Decreased Memory, Fainting, Headaches, Numbness, Seizures, Tingling, Tremor, Trouble walking and Weakness. Psychiatric Not Present- Anxiety, Bipolar, Change in Sleep Pattern, Depression, Fearful and Frequent crying. Endocrine Not Present- Cold Intolerance, Excessive Hunger,  Hair Changes, Heat Intolerance, Hot flashes and New Diabetes. Hematology Not Present- Easy Bruising, Excessive bleeding, Gland problems, HIV and Persistent Infections.   Vitals Seth Villanueva [place] MA; 04/25/2014 3:59 PM) 04/25/2014 3:58 PM Weight: 158.6 lb Height: 73in Body Surface Area: 1.92 m Body Mass Index: 20.92 kg/m Temp.: 98.26F(Oral)  Pulse: 68 (Regular)  Resp.: 14 (Unlabored)  BP: 142/90 (Sitting, Left Arm, Standard)    Physical Exam Seth Ruff MD; 09/21/9415 4:44 PM) General Mental Status-Alert. General Appearance-Cooperative.  Chest and Lung Exam Auscultation Breath sounds - Normal.  Cardiovascular Auscultation Heart Sounds - Normal heart sounds.  Abdomen Palpation/Percussion Palpation and Percussion of the abdomen reveal - Soft and Non Tender.  Rectal Anorectal Exam Internal - Note: tumor scarring noted approximately 4 cm from anal verge just above the coccyx posteriorly.    Assessment & Plan Seth Ruff MD; 11/16/1446 4:28 PM) PRIMARY CANCER OF RECTUM (154.1  C20) Story: this is a 33 year old who began to have rectal bleeding last November. He was initially attributed to hemorrhoids. He did not get better. He ultimately ended up seeing a gastroenterologist and was found to have a rectal mass on physical exam. Biopsies confirmed adenocarcinoma. The area was tattooed. CT scan of chest abdomen and pelvis were negative for metastatic disease. EUS on 01/30/2014 showed a T3 N2 lesion Impression: on exam the tumor appears to be regressing. It is posterior and approximately 3-4 cm from the anal verge. We discussed surgical resection I believe he'll be a good candidate for a robotic-assisted low anterior resection with ultralow anastomosis. He will need a diverting ostomy. We'll set him up to see the ostomy nurse for marking prior to the Villanueva. He understands that there is a possibility that we will have to do an abdominal perineal resection. The  Villanueva and anatomy were described to the patient as well as the risks of Villanueva and the possible complications. These include: Bleeding, deep abdominal infections and possible wound complications such as hernia and infection, damage to adjacent structures, leak of surgical connections, which can lead to other surgeries and possibly an ostomy, possible need for other procedures, such as abscess drains in radiology, possible prolonged hospital stay, possible diarrhea from removal of part of the colon, possible bladder or sexual dysfunction if having rectal Villanueva, possible constipation from narcotics, prolonged fatigue/weakness or appetite loss, possible early recurrence of of disease, possible complications of their medical problems such as heart disease or arrhythmias or lung problems, death (less than 1%). I believe the patient understands and wishes to proceed with the Villanueva.

## 2014-05-31 NOTE — Transfer of Care (Signed)
Immediate Anesthesia Transfer of Care Note  Patient: Seth Villanueva.  Procedure(s) Performed: Procedure(s): INSERTION PORT-A-CATH LET SUBCLAVIAN (Left)  Patient Location: PACU  Anesthesia Type:General  Level of Consciousness: awake, alert  and oriented  Airway & Oxygen Therapy: Patient Spontanous Breathing and Patient connected to face mask oxygen  Post-op Assessment: Report given to PACU RN and Post -op Vital signs reviewed and stable  Post vital signs: Reviewed and stable  Complications: No apparent anesthesia complications

## 2014-05-31 NOTE — Op Note (Signed)
05/31/2014  2:37 PM  PATIENT:  Seth Villanueva.  33 y.o. male  Patient Care Team: Leonides Sake, MD as PCP - General (Family Medicine)  PRE-OPERATIVE DIAGNOSIS:  Stage 3 rectal cancer  POST-OPERATIVE DIAGNOSIS:  Stage 3 rectal cancer  PROCEDURE:  INSERTION PORT-A-CATH: LET SUBCLAVIAN  SURGEON:  Surgeon(s): [name], MD  ANESTHESIA:   local and MAC  EBL:     DISPOSITION OF SPECIMEN:  N/A  COUNTS:  YES  PLAN OF CARE: Discharge to home after PACU  PATIENT DISPOSITION:  PACU - hemodynamically stable.  INDICATION: Patient with need for IV chemotherapy. Port-A-Cath placement was requested.   Use of a central venous catheter for intravenous therapy was discussed. Technique of catheter placement using ultrasound and fluoroscopy guidance was discussed. Risks such as bleeding, infection, pneumothorax, catheter occlusion, reoperation, and other risks were discussed. I noted a good likelihood this will help address the problem. Questions were answered. The patient expressed understanding & wishes to proceed.   Findings: Normal-appearing anatomy.   8 [place] power port. It goes through the left subclavian vein.  Procedure: Informed consent was confirmed. Patient was brought the operating room and positioned supine. Arms were tucked. The patient underwent deep sedation. Neck and chest were clipped and prepped and draped in a sterile fashion. A surgical timeout confirmed our plan. I placed a field block of local anesthesia on the chest. I entered into the left subclavian on the first venipuncture using standard landmarks. Non-pulsatile blood was returned. Wire was easily passed into the inferior vena cava and confirmed by fluoroscopy. I confirmed placement of the wire in the right side of the chest.  I made an incision in the lateral infraclavicular area and made a subcutaneous pocket. I used a dilator on the wire using Seldinger technique to dilate the tract under fluoroscopy. I  placed the catheter into the sheath. I then peeled away the dilator sheath. I tunneled the power port from the puncture site to the chest pocket. I cut the catheter to appropriate length and attached it to the port using the plastic connector. The port was placed into the pocket and secured to the left anterior chest wall using 2-0 Prolene interrupted stitches x2. Catheter flushed well.  Fluoroscopy confirmed the tip in the distal SVC. Catheter aspirated and flushed well. On final fluoro reevaluation the tip seen to be in good position in the distal SVC.  I closed the wounds using 3-0 Vicryl interrupted sutures for the pocket and 4-0 Vicryl stitch was used to close the skin. Dermabond was used on the 2 incisions. CXR will be performed in PACU. Patient should go home later today. Catheter is okay to use.

## 2014-05-31 NOTE — Discharge Instructions (Signed)
    PORT-A-CATH: POST OP INSTRUCTIONS  Always review your discharge instruction sheet given to you by the facility where your surgery was performed.   1. A prescription for pain medication may be given to you upon discharge. Take your pain medication as prescribed, if needed. If narcotic pain medicine is not needed, then you make take acetaminophen (Tylenol) or ibuprofen (Advil) as needed.  2. Take your usually prescribed medications unless otherwise directed. 3. If you need a refill on your pain medication, please contact our office. All narcotic pain medicine now requires a paper prescription.  Phoned in and fax refills are no longer allowed by law.  Prescriptions will not be filled after 5 pm or on weekends.  4. You should follow a light diet for the remainder of the day after your procedure. 5. Most patients will experience some mild swelling and/or bruising in the area of the incision. It may take several days to resolve. 6. It is common to experience some constipation if taking pain medication after surgery. Increasing fluid intake and taking a stool softener (such as Colace) will usually help or prevent this problem from occurring. A mild laxative (Milk of Magnesia or Miralax) should be taken according to package directions if there are no bowel movements after 48 hours.  7. Unless discharge instructions indicate otherwise, you may remove your bandages 48 hours after surgery, and you may shower at that time. You may have steri-strips (small white skin tapes) in place directly over the incision.  These strips should be left on the skin for 7-10 days.  If your surgeon used Dermabond (skin glue) on the incision, you may shower in 24 hours.  The glue will flake off over the next 2-3 weeks.  8. If your port is left accessed at the end of surgery (needle left in port), the dressing cannot get wet and should only by changed by a healthcare professional. When the port is no longer accessed (when the  needle has been removed), follow step 7.   9. ACTIVITIES:  Limit activity involving your arms for the next 72 hours. Do no strenuous exercise or activity for 1 week. You may drive when you are no longer taking prescription pain medication, you can comfortably wear a seatbelt, and you can maneuver your car. 10.You may need to see your doctor in the office for a follow-up appointment.  Please       check with your doctor.  11.When you receive a new Port-a-Cath, you will get a product guide and        ID card.  Please keep them in case you need them.  WHEN TO CALL YOUR DOCTOR ([phone]): 1. Fever over 101.0 2. Chills 3. Continued bleeding from incision 4. Increased redness and tenderness at the site 5. Shortness of breath, difficulty breathing   The clinic staff is available to answer your questions during regular business hours. Please don't hesitate to call and ask to speak to one of the nurses or medical assistants for clinical concerns. If you have a medical emergency, go to the nearest emergency room or call 911.  A surgeon from [facility] is always on call at the hospital.     For further information, please visit [email or web address]      

## 2014-05-31 NOTE — Anesthesia Preprocedure Evaluation (Signed)
Anesthesia Evaluation  Patient identified by MRN, date of birth, ID band Patient awake    Reviewed: Allergy & Precautions, H&P , NPO status , Patient's Chart, lab work & pertinent test results  History of Anesthesia Complications Negative for: history of anesthetic complications  Airway Mallampati: I TM Distance: >3 FB Neck ROM: Full    Dental no notable dental hx. (+) Teeth Intact, Dental Advisory Given   Pulmonary former smoker,  breath sounds clear to auscultation  Pulmonary exam normal       Cardiovascular negative cardio ROS  Rhythm:Regular Rate:Normal     Neuro/Psych PSYCHIATRIC DISORDERS Anxiety negative neurological ROS     GI/Hepatic Neg liver ROS, Hx of rectal cancer   Endo/Other  negative endocrine ROS  Renal/GU negative Renal ROS  negative genitourinary   Musculoskeletal negative musculoskeletal ROS (+)   Abdominal   Peds negative pediatric ROS (+)  Hematology negative hematology ROS (+)   Anesthesia Other Findings   Reproductive/Obstetrics negative OB ROS                           Anesthesia Physical Anesthesia Plan  ASA: II  Anesthesia Plan: General   Post-op Pain Management:    Induction: Intravenous  Airway Management Planned: LMA  Additional Equipment:   Intra-op Plan:   Post-operative Plan: Extubation in OR  Informed Consent: I have reviewed the patients History and Physical, chart, labs and discussed the procedure including the risks, benefits and alternatives for the proposed anesthesia with the patient or authorized representative who has indicated his/her understanding and acceptance.   Dental advisory given  Plan Discussed with: CRNA  Anesthesia Plan Comments:         Anesthesia Quick Evaluation

## 2014-05-31 NOTE — Anesthesia Postprocedure Evaluation (Signed)
  Anesthesia Post-op Note  Patient: Seth Villanueva.  Procedure(s) Performed: Procedure(s) (LRB): INSERTION PORT-A-CATH LET SUBCLAVIAN (Left)  Patient Location: PACU  Anesthesia Type: General  Level of Consciousness: awake and alert   Airway and Oxygen Therapy: Patient Spontanous Breathing  Post-op Pain: mild  Post-op Assessment: Post-op Vital signs reviewed, Patient's Cardiovascular Status Stable, Respiratory Function Stable, Patent Airway and No signs of Nausea or vomiting  Last Vitals:  Filed Vitals:   05/31/14 1203  BP: 135/77  Pulse: 100  Temp: 36.8 C  Resp: 18    Post-op Vital Signs: stable   Complications: No apparent anesthesia complications

## 2014-06-01 ENCOUNTER — Encounter (HOSPITAL_COMMUNITY): Payer: Self-pay | Admitting: General Surgery

## 2014-06-01 NOTE — Discharge Summary (Signed)
Physician Discharge Summary  Patient ID: Seth Villanueva. MRN: [id number] DOB/AGE: [birth year] 33 y.o.  Admit date: 05/18/2014 Discharge date: 05/27/2014  Admission Diagnoses:Rectal cancer  Discharge Diagnoses:  Active Problems:   Rectal cancer   Discharged Condition: stable  Hospital Course: Pt admitted after proctectomy and sigmoidectomy for distal rectal cancer.  He underwent a hand sewn colo-anal anastomosis with diverting loop ileostomy.  He did well on the floor.  He began to have bowel function on POD 1.  His foley was removed on POD 2.  His diet was advanced slowly.  The last 4-5 days of his stay was spent controlling ostomy output and managing fluids.    Consults: Ostomy RN  Significant Diagnostic Studies: labs: cbc, chemistry  Treatments: IV hydration, analgesia: acetaminophen w/ codeine and surgery: see above  Discharge Exam: Blood pressure 120/62, pulse 78, temperature 98.1 F (36.7 C), temperature source Oral, resp. rate 18, height 6\' 1"  (1.854 m), weight 160 lb 6 oz (72.746 kg), SpO2 100.00%. General appearance: alert and cooperative GI: normal findings: soft, non-tender Incision/Wound: clean, dry, intact  Disposition: 01-Home or Self Care  Discharge Instructions   Discharge patient    Complete by:  As directed             Medication List    STOP taking these medications       hydrocortisone 25 MG suppository  Commonly known as:  ANUSOL-HC     polyethylene glycol packet  Commonly known as:  MIRALAX / GLYCOLAX      TAKE these medications       acetaminophen 325 MG tablet  Commonly known as:  TYLENOL  Take 650 mg by mouth once as needed for mild pain.     ibuprofen 200 MG tablet  Commonly known as:  ADVIL,MOTRIN  Take 200 mg by mouth every 6 (six) hours as needed.     IMODIUM A-D PO  Take 2 mg by mouth once as needed (loose stool).     magic mouthwash Soln  Take 15 mLs by mouth 4 (four) times daily.     ondansetron 8 MG tablet   Commonly known as:  ZOFRAN  Take 8 mg by mouth every 8 (eight) hours as needed for nausea or vomiting.     oxyCODONE 5 MG immediate release tablet  Commonly known as:  Oxy IR/ROXICODONE  Take 1-2 tablets (5-10 mg total) by mouth every 4 (four) hours as needed for moderate pain, severe pain or breakthrough pain.     polycarbophil 625 MG tablet  Commonly known as:  FIBERCON  Take 1 tablet (625 mg total) by mouth 2 (two) times daily.     pseudoephedrine 30 MG tablet  Commonly known as:  SUDAFED  Take 30 mg by mouth every 4 (four) hours as needed for congestion.     simethicone 125 MG chewable tablet  Commonly known as:  MYLICON  Chew 086 mg by mouth every 6 (six) hours as needed for flatulence (gel pill OTC).           Follow-up Information   Follow up with [facility]. (nurse to assist with ostomy care)    Contact information:   [address] [place] [address] [phone]       Signed: [name] 96/29/5284, 7:36 AM

## 2014-06-07 ENCOUNTER — Telehealth (INDEPENDENT_AMBULATORY_CARE_PROVIDER_SITE_OTHER): Payer: Self-pay

## 2014-06-07 ENCOUNTER — Ambulatory Visit (HOSPITAL_BASED_OUTPATIENT_CLINIC_OR_DEPARTMENT_OTHER): Payer: 59 | Admitting: Genetic Counselor

## 2014-06-07 ENCOUNTER — Other Ambulatory Visit: Payer: 59

## 2014-06-07 ENCOUNTER — Telehealth: Payer: Self-pay | Admitting: *Deleted

## 2014-06-07 ENCOUNTER — Encounter: Payer: Self-pay | Admitting: Genetic Counselor

## 2014-06-07 DIAGNOSIS — Z856 Personal history of leukemia: Secondary | ICD-10-CM

## 2014-06-07 DIAGNOSIS — Z315 Encounter for genetic counseling: Secondary | ICD-10-CM

## 2014-06-07 DIAGNOSIS — Z853 Personal history of malignant neoplasm of breast: Secondary | ICD-10-CM

## 2014-06-07 DIAGNOSIS — C20 Malignant neoplasm of rectum: Secondary | ICD-10-CM

## 2014-06-07 NOTE — Progress Notes (Signed)
Patient Name: Seth Villanueva. Patient Age: 33 y.o. Encounter Date: 06/07/2014  Referring Physician: Betsy Coder, MD  Primary Care Provider: Leonides Sake, MD   Mr. Demonie Kassa., a 33 y.o. male, is being seen at the [facility] due to a personal history of rectal cancer. He presents to clinic today with his parents to discuss the possibility of a hereditary predisposition to cancer and discuss whether genetic testing is warranted.  HISTORY OF PRESENT ILLNESS: Mr. 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 He is s/p neoadjuvant chemoraditaion and surgery and will start adjuvant chemotherapy next week. He has normal expression of the MMR genes on IHC and polyposis was not detected at the time or surgery.   Past Medical History  Diagnosis Date  . Rectal polyp 2015  . Allergy   . Cancer 01/30/14    rectum-Invasive adenocarcinoma  . Anxiety     new dx  . S/P radiation therapy 02/19/14-03/28/14    rectal  50.4Gy  . S/P chemotherapy, time since 4-12 weeks     Past Surgical History  Procedure Laterality Date  . No past surgeries    . Wisdom tooth extraction    . Eus N/A 01/30/2014    Procedure: LOWER ENDOSCOPIC ULTRASOUND (EUS);  Surgeon: Arta Silence, MD;  Location: [facility] ENDOSCOPY;  Service: Endoscopy;  Laterality: N/A;  . Rectal biopsy  01/30/14    Invasive Adenocarcinoma  . Colonoscopy w/ biopsies  01/26/14    fungating nonobstructing large mass distal rectum  . Laparoscopic low anterior resection  05/18/2014    with splenic flexure mobilization and diverting loop ileostomy  . Portacath placement Left 05/31/2014    Procedure: INSERTION PORT-A-CATH LET SUBCLAVIAN;  Surgeon: [name], MD;  Location: [facility] ORS;  Service: General;  Laterality: Left;    History   Social History  . Marital Status: Single    Spouse Name: N/A    Number of Children: N/A  . Years of Education: N/A   Occupational History  . TECHNICIAN      [employer]    Social History Main Topics  . Smoking status: Former Smoker    Types: Cigarettes    Quit date: 02/02/2012  . Smokeless tobacco: Former Systems developer    Types: Snuff    Quit date: 02/02/2012  . Alcohol Use: No  . Drug Use: No     Comment: remote smoked < 1pack a moth,quit many years ago, quit dipping 8 years ago  . Sexual Activity: Not Currently   Other Topics Concern  . Not on file   Social History Narrative   Single   Works at [employer] in [employer] lab     FAMILY HISTORY:   During the visit, a 4-generation pedigree was obtained. Family tree will be sent for scanning and will be in EPIC under the Media tab.  Significant diagnoses include the following:  Family History  Problem Relation Age of Onset  . Breast cancer Maternal Aunt     pat mat great aunt; currently 33  . Leukemia Maternal Aunt     pat mat great aunt; deceased 53s  . Other Paternal Aunt     colon polyps (type/#?)    Additionally, Mr. Irving has no children. His only brother is 2 and also has no children.  Mr. Rostro ancestry is [place]. There is no known Jewish ancestry and no consanguinity.  ASSESSMENT AND PLAN: Mr. Kendrix is a 33 y.o. male with a personal history of  rectal cancer diagnosed at age 72. While he has a negative family history, his parents are very young and have not yet initiated colon cancer screening. We reviewed the characteristics, features and inheritance patterns of hereditary cancer syndromes and discussed MUTYH more specifically. We also talked about Lynch syndrome and explained the lower likelihood for this given his normal tumor studies showing intact Lynch-associated proteins on IHC. We also discussed genetic testing, including the appropriate family members to test, the process of testing, insurance coverage and implications of results.   We stressed the importance of his brother and his parents pursuing colon cancer screening even if his test is negative.  Mr. Levene wished to pursue  genetic testing and a blood sample will be sent to [facility] for analysis of the 17 genes on the ColoNext gene panel. We discussed the implications of a positive, negative and/ or Variant of Uncertain Significance (VUS) result. Results should be available in approximately 4-5 weeks, at which point we will contact him and address implications for him as well as address genetic testing for at-risk family members, if needed.    We encouraged Mr. Mccutchan to remain in contact with Cancer Genetics annually so that we can update the family history and inform him of any changes in cancer genetics and testing that may be of benefit for this family. Mr.  Ingham questions were answered to his satisfaction today.   Thank you for the referral and allowing [place] to share in the care of your patient.   The patient was seen for a total of 35 minutes, greater than 50% of which was spent face-to-face counseling. This patient was discussed with the overseeing provider who agrees with the above.   Steele Berg, MS, [place] Certified Genetic Counselor phone: [phone] [email or web address]

## 2014-06-07 NOTE — Telephone Encounter (Signed)
Please make sure he brings an extra ostomy appliance to the office for his appointment, so that we can look at it.  A lot of time the bleeding can be due to skin irritation.  Make sure that there is no skin exposed when applying the bag.  Can also stop by [facility] medical supply and have the ostomy RN there look at it.

## 2014-06-07 NOTE — Telephone Encounter (Signed)
INSTRUCTED PT. TO CONTACT HIS SURGEON. HE VOICES UNDERSTANDING.

## 2014-06-07 NOTE — Telephone Encounter (Signed)
Pt is s/p surgery for rectal cancer by Dr. Marcello Moores on 05/18/14.  He has been dealing with significant swelling at the stoma which he says has now resolved.  He is asking if the red tissue around the stoma is normal.  I explained that this was healthy granulation tissue and the red color is a good sign.  He said he does bleed a little when he changes his ostomy bag and I told him that was fine; healthy tissue will bleed.  If he notices any unusual redness on the skin around the stoma to let us know.  Pt agreed to do so.

## 2014-06-10 ENCOUNTER — Other Ambulatory Visit: Payer: Self-pay | Admitting: Oncology

## 2014-06-11 ENCOUNTER — Other Ambulatory Visit (HOSPITAL_BASED_OUTPATIENT_CLINIC_OR_DEPARTMENT_OTHER): Payer: 59

## 2014-06-11 ENCOUNTER — Ambulatory Visit (HOSPITAL_BASED_OUTPATIENT_CLINIC_OR_DEPARTMENT_OTHER): Payer: 59 | Admitting: Nurse Practitioner

## 2014-06-11 ENCOUNTER — Telehealth: Payer: Self-pay | Admitting: Oncology

## 2014-06-11 ENCOUNTER — Ambulatory Visit (HOSPITAL_BASED_OUTPATIENT_CLINIC_OR_DEPARTMENT_OTHER): Payer: 59

## 2014-06-11 VITALS — BP 143/76 | HR 84 | Temp 97.9°F | Resp 18 | Ht 74.0 in | Wt 152.0 lb

## 2014-06-11 DIAGNOSIS — Z5111 Encounter for antineoplastic chemotherapy: Secondary | ICD-10-CM

## 2014-06-11 DIAGNOSIS — C20 Malignant neoplasm of rectum: Secondary | ICD-10-CM

## 2014-06-11 LAB — CBC WITH DIFFERENTIAL/PLATELET
BASO%: 0.7 % (ref 0.0–2.0)
BASOS ABS: 0.1 10*3/uL (ref 0.0–0.1)
EOS ABS: 0.1 10*3/uL (ref 0.0–0.5)
EOS%: 1.6 % (ref 0.0–7.0)
HCT: 41.6 % (ref 38.4–49.9)
HEMOGLOBIN: 14.1 g/dL (ref 13.0–17.1)
LYMPH%: 7.1 % — AB (ref 14.0–49.0)
MCH: 31.6 pg (ref 27.2–33.4)
MCHC: 33.9 g/dL (ref 32.0–36.0)
MCV: 93.2 fL (ref 79.3–98.0)
MONO#: 0.9 10*3/uL (ref 0.1–0.9)
MONO%: 12.1 % (ref 0.0–14.0)
NEUT%: 78.5 % — ABNORMAL HIGH (ref 39.0–75.0)
NEUTROS ABS: 6 10*3/uL (ref 1.5–6.5)
PLATELETS: 246 10*3/uL (ref 140–400)
RBC: 4.46 10*6/uL (ref 4.20–5.82)
RDW: 13.4 % (ref 11.0–14.6)
WBC: 7.6 10*3/uL (ref 4.0–10.3)
lymph#: 0.5 10*3/uL — ABNORMAL LOW (ref 0.9–3.3)

## 2014-06-11 LAB — COMPREHENSIVE METABOLIC PANEL (CC13)
ALK PHOS: 118 U/L (ref 40–150)
ALT: 26 U/L (ref 0–55)
AST: 10 U/L (ref 5–34)
Albumin: 3.2 g/dL — ABNORMAL LOW (ref 3.5–5.0)
Anion Gap: 9 mEq/L (ref 3–11)
BILIRUBIN TOTAL: 0.4 mg/dL (ref 0.20–1.20)
BUN: 10.7 mg/dL (ref 7.0–26.0)
CO2: 26 mEq/L (ref 22–29)
Calcium: 9.5 mg/dL (ref 8.4–10.4)
Chloride: 104 mEq/L (ref 98–109)
Creatinine: 0.8 mg/dL (ref 0.7–1.3)
GLUCOSE: 108 mg/dL (ref 70–140)
POTASSIUM: 3.5 meq/L (ref 3.5–5.1)
Sodium: 140 mEq/L (ref 136–145)
Total Protein: 6.8 g/dL (ref 6.4–8.3)

## 2014-06-11 MED ORDER — DEXTROSE 5 % IV SOLN
Freq: Once | INTRAVENOUS | Status: AC
  Administered 2014-06-11: 12:00:00 via INTRAVENOUS

## 2014-06-11 MED ORDER — OXYCODONE HCL 5 MG PO TABS: 5.0000 mg | ORAL_TABLET | ORAL | Status: DC | PRN

## 2014-06-11 MED ORDER — ONDANSETRON 8 MG/50ML IVPB (CHCC)
8.0000 mg | Freq: Once | INTRAVENOUS | Status: AC
  Administered 2014-06-11: 8 mg via INTRAVENOUS

## 2014-06-11 MED ORDER — OXALIPLATIN CHEMO INJECTION 100 MG/20ML
85.0000 mg/m2 | Freq: Once | INTRAVENOUS | Status: AC
  Administered 2014-06-11: 160 mg via INTRAVENOUS
  Filled 2014-06-11: qty 32

## 2014-06-11 MED ORDER — DEXAMETHASONE SODIUM PHOSPHATE 10 MG/ML IJ SOLN
INTRAMUSCULAR | Status: AC
  Filled 2014-06-11: qty 1

## 2014-06-11 MED ORDER — SODIUM CHLORIDE 0.9 % IV SOLN
2400.0000 mg/m2 | INTRAVENOUS | Status: DC
  Administered 2014-06-11: 4550 mg via INTRAVENOUS
  Filled 2014-06-11: qty 91

## 2014-06-11 MED ORDER — ONDANSETRON 8 MG/NS 50 ML IVPB
INTRAVENOUS | Status: AC
  Filled 2014-06-11: qty 8

## 2014-06-11 MED ORDER — FLUOROURACIL CHEMO INJECTION 2.5 GM/50ML
400.0000 mg/m2 | Freq: Once | INTRAVENOUS | Status: AC
  Administered 2014-06-11: 750 mg via INTRAVENOUS
  Filled 2014-06-11: qty 15

## 2014-06-11 MED ORDER — ONDANSETRON HCL 8 MG PO TABS: 8.0000 mg | ORAL_TABLET | Freq: Three times a day (TID) | ORAL | Status: DC | PRN

## 2014-06-11 MED ORDER — LEUCOVORIN CALCIUM INJECTION 350 MG
400.0000 mg/m2 | Freq: Once | INTRAVENOUS | Status: AC
  Administered 2014-06-11: 760 mg via INTRAVENOUS
  Filled 2014-06-11: qty 38

## 2014-06-11 MED ORDER — DEXAMETHASONE SODIUM PHOSPHATE 10 MG/ML IJ SOLN
10.0000 mg | Freq: Once | INTRAMUSCULAR | Status: AC
  Administered 2014-06-11: 10 mg via INTRAVENOUS

## 2014-06-11 MED ORDER — FLUCONAZOLE 100 MG PO TABS: 100.0000 mg | ORAL_TABLET | Freq: Every day | ORAL | Status: DC

## 2014-06-11 NOTE — Progress Notes (Signed)
1st time Folfox. Tolerated very well. Reviewed discharge instructions, use of 5FU pump, appropriate phone #'s to call with questions/concerns. Given spill kit and reviewed use of this. Patient and his parents voiced understanding of all instructions.

## 2014-06-11 NOTE — Progress Notes (Signed)
  [facility] OFFICE PROGRESS NOTE   Diagnosis:  Rectal cancer  INTERVAL HISTORY:   Seth Villanueva returns as scheduled. He denies nausea/vomiting. He reports good ileostomy output. He takes Imodium as needed. He has recently noted intermittent rectal discharge. He continues to have periodic pain/soreness at the rectum. He takes oxycodone as needed. He has a white coating over his tongue and notes a dry mouth.  Objective:  Vital signs in last 24 hours:  Blood pressure 143/76, pulse 84, temperature 97.9 F (36.6 C), temperature source Oral, resp. rate 18, height $RemoveBe'6\' 2"'[id number]$  (1.88 m), weight 152 lb (68.947 kg), SpO2 100 %.    HEENT: no thrush or ulcers. Mild white coating over tongue. Resp: lungs clear bilaterally. Cardio: regular rate and rhythm. GI: abdomen soft and nontender. Right lower quadrant ileostomy. Vascular: no leg edema. Port-A-Cath without erythema.    Lab Results:  Lab Results  Component Value Date   WBC 7.6 06/11/2014   HGB 14.1 06/11/2014   HCT 41.6 06/11/2014   MCV 93.2 06/11/2014   PLT 246 06/11/2014   NEUTROABS 6.0 06/11/2014    Imaging:  No results found.  Medications: I have reviewed the patient's current medications.  Assessment/Plan: 1. 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 Completion of radiation and concurrent Xeloda 03/28/2014.  Low anterior resection, diverting loop ileostomy, and coloanal anastomosis 05/18/2014,ypT2,ypN2, no loss of mismatch repair protein expression  Cycle 1 adjuvant FOLFOX 06/11/2014 2. History  Rectal pain secondary to #1.   Disposition:Seth Villanueva appears stable. Plan to proceed with cycle 1 adjuvant FOLFOX today as scheduled. He will return for a followup visit and cycle 2 FOLFOX in 2 weeks. He will contact the office in the interim with any problems.  He will complete a 4 day course of Diflucan for oral candidiasis.  Plan reviewed with Dr. Benay Spice.  Ned Card ANP/GNP-BC   06/11/2014  11:17 AM

## 2014-06-11 NOTE — Patient Instructions (Addendum)
[facility] Discharge Instructions for Patients Receiving Chemotherapy  Today you received the following chemotherapy agents: Oxaliplatin, Leucovorin, 5FU  To help prevent nausea and vomiting after your treatment, we encourage you to take your nausea medication:Zofran 8 mg every 8 hours as needed.   If you develop nausea and vomiting that is not controlled by your nausea medication, call the clinic.   BELOW ARE SYMPTOMS THAT SHOULD BE REPORTED IMMEDIATELY:  *FEVER GREATER THAN 100.5 F  *CHILLS WITH OR WITHOUT FEVER  NAUSEA AND VOMITING THAT IS NOT CONTROLLED WITH YOUR NAUSEA MEDICATION  *UNUSUAL SHORTNESS OF BREATH  *UNUSUAL BRUISING OR BLEEDING  TENDERNESS IN MOUTH AND THROAT WITH OR WITHOUT PRESENCE OF ULCERS  *URINARY PROBLEMS  *BOWEL PROBLEMS  UNUSUAL RASH Items with * indicate a potential emergency and should be followed up as soon as possible.  Feel free to call the clinic you have any questions or concerns. The clinic phone number is [phone].   Oxaliplatin Injection What is this medicine? OXALIPLATIN (ox AL i PLA tin) is a chemotherapy drug. It targets fast dividing cells, like cancer cells, and causes these cells to die. This medicine is used to treat cancers of the colon and rectum, and many other cancers. This medicine may be used for other purposes; ask your health care provider or pharmacist if you have questions. COMMON BRAND NAME(S): Eloxatin What should I tell my health care provider before I take this medicine? They need to know if you have any of these conditions: -kidney disease -an unusual or allergic reaction to oxaliplatin, other chemotherapy, other medicines, foods, dyes, or preservatives -pregnant or trying to get pregnant -breast-feeding How should I use this medicine? This drug is given as an infusion into a vein. It is administered in a hospital or clinic by a specially trained health care professional. Talk to your  pediatrician regarding the use of this medicine in children. Special care may be needed. Overdosage: If you think you have taken too much of this medicine contact a poison control center or emergency room at once. NOTE: This medicine is only for you. Do not share this medicine with others. What if I miss a dose? It is important not to miss a dose. Call your doctor or health care professional if you are unable to keep an appointment. What may interact with this medicine? -medicines to increase blood counts like filgrastim, pegfilgrastim, sargramostim -probenecid -some antibiotics like amikacin, gentamicin, neomycin, polymyxin B, streptomycin, tobramycin -zalcitabine Talk to your doctor or health care professional before taking any of these medicines: -acetaminophen -aspirin -ibuprofen -ketoprofen -naproxen This list may not describe all possible interactions. Give your health care provider a list of all the medicines, herbs, non-prescription drugs, or dietary supplements you use. Also tell them if you smoke, drink alcohol, or use illegal drugs. Some items may interact with your medicine. What should I watch for while using this medicine? Your condition will be monitored carefully while you are receiving this medicine. You will need important blood work done while you are taking this medicine. This medicine can make you more sensitive to cold. Do not drink cold drinks or use ice. Cover exposed skin before coming in contact with cold temperatures or cold objects. When out in cold weather wear warm clothing and cover your mouth and nose to warm the air that goes into your lungs. Tell your doctor if you get sensitive to the cold. This drug may make you feel generally unwell. This is not uncommon,  as chemotherapy can affect healthy cells as well as cancer cells. Report any side effects. Continue your course of treatment even though you feel ill unless your doctor tells you to stop. In some cases, you  may be given additional medicines to help with side effects. Follow all directions for their use. Call your doctor or health care professional for advice if you get a fever, chills or sore throat, or other symptoms of a cold or flu. Do not treat yourself. This drug decreases your body's ability to fight infections. Try to avoid being around people who are sick. This medicine may increase your risk to bruise or bleed. Call your doctor or health care professional if you notice any unusual bleeding. Be careful brushing and flossing your teeth or using a toothpick because you may get an infection or bleed more easily. If you have any dental work done, tell your dentist you are receiving this medicine. Avoid taking products that contain aspirin, acetaminophen, ibuprofen, naproxen, or ketoprofen unless instructed by your doctor. These medicines may hide a fever. Do not become pregnant while taking this medicine. Women should inform their doctor if they wish to become pregnant or think they might be pregnant. There is a potential for serious side effects to an unborn child. Talk to your health care professional or pharmacist for more information. Do not breast-feed an infant while taking this medicine. Call your doctor or health care professional if you get diarrhea. Do not treat yourself. What side effects may I notice from receiving this medicine? Side effects that you should report to your doctor or health care professional as soon as possible: -allergic reactions like skin rash, itching or hives, swelling of the face, lips, or tongue -low blood counts - This drug may decrease the number of white blood cells, red blood cells and platelets. You may be at increased risk for infections and bleeding. -signs of infection - fever or chills, cough, sore throat, pain or difficulty passing urine -signs of decreased platelets or bleeding - bruising, pinpoint red spots on the skin, black, tarry stools,  nosebleeds -signs of decreased red blood cells - unusually weak or tired, fainting spells, lightheadedness -breathing problems -chest pain, pressure -cough -diarrhea -jaw tightness -mouth sores -nausea and vomiting -pain, swelling, redness or irritation at the injection site -pain, tingling, numbness in the hands or feet -problems with balance, talking, walking -redness, blistering, peeling or loosening of the skin, including inside the mouth -trouble passing urine or change in the amount of urine Side effects that usually do not require medical attention (report to your doctor or health care professional if they continue or are bothersome): -changes in vision -constipation -hair loss -loss of appetite -metallic taste in the mouth or changes in taste -stomach pain This list may not describe all possible side effects. Call your doctor for medical advice about side effects. You may report side effects to FDA at 1-800-FDA-1088. Where should I keep my medicine? This drug is given in a hospital or clinic and will not be stored at home. NOTE: This sheet is a summary. It may not cover all possible information. If you have questions about this medicine, talk to your doctor, pharmacist, or health care provider.  2015, Elsevier/Gold Standard. (2008-02-21 17:22:47)   Leucovorin injection    What is this medicine? LEUCOVORIN (loo koe VOR in) is used to prevent or treat the harmful effects of some medicines. This medicine is used to treat anemia caused by a low amount of folic  acid in the body. It is also used with 5-fluorouracil (5-FU) to treat colon cancer. This medicine may be used for other purposes; ask your health care provider or pharmacist if you have questions. What should I tell my health care provider before I take this medicine? They need to know if you have any of these conditions: -anemia from low levels of vitamin B-12 in the blood -an unusual or allergic reaction to  leucovorin, folic acid, other medicines, foods, dyes, or preservatives -pregnant or trying to get pregnant -breast-feeding How should I use this medicine? This medicine is for injection into a muscle or into a vein. It is given by a health care professional in a hospital or clinic setting. Talk to your pediatrician regarding the use of this medicine in children. Special care may be needed. Overdosage: If you think you have taken too much of this medicine contact a poison control center or emergency room at once. NOTE: This medicine is only for you. Do not share this medicine with others. What if I miss a dose? This does not apply. What may interact with this medicine? -capecitabine -fluorouracil -phenobarbital -phenytoin -primidone -trimethoprim-sulfamethoxazole This list may not describe all possible interactions. Give your health care provider a list of all the medicines, herbs, non-prescription drugs, or dietary supplements you use. Also tell them if you smoke, drink alcohol, or use illegal drugs. Some items may interact with your medicine. What should I watch for while using this medicine? Your condition will be monitored carefully while you are receiving this medicine. This medicine may increase the side effects of 5-fluorouracil, 5-FU. Tell your doctor or health care professional if you have diarrhea or mouth sores that do not get better or that get worse. What side effects may I notice from receiving this medicine? Side effects that you should report to your doctor or health care professional as soon as possible: -allergic reactions like skin rash, itching or hives, swelling of the face, lips, or tongue -breathing problems -fever, infection -mouth sores -unusual bleeding or bruising -unusually weak or tired Side effects that usually do not require medical attention (report to your doctor or health care professional if they continue or are bothersome): -constipation or  diarrhea -loss of appetite -nausea, vomiting This list may not describe all possible side effects. Call your doctor for medical advice about side effects. You may report side effects to FDA at 1-800-FDA-1088. Where should I keep my medicine? This drug is given in a hospital or clinic and will not be stored at home. NOTE: This sheet is a summary. It may not cover all possible information. If you have questions about this medicine, talk to your doctor, pharmacist, or health care provider.  2015, Elsevier/Gold Standard. (2008-01-31 16:50:29)   Fluorouracil, 5-FU injection What is this medicine? FLUOROURACIL, 5-FU (flure oh YOOR a sil) is a chemotherapy drug. It slows the growth of cancer cells. This medicine is used to treat many types of cancer like breast cancer, colon or rectal cancer, pancreatic cancer, and stomach cancer. This medicine may be used for other purposes; ask your health care provider or pharmacist if you have questions. COMMON BRAND NAME(S): Adrucil What should I tell my health care provider before I take this medicine? They need to know if you have any of these conditions: -blood disorders -dihydropyrimidine dehydrogenase (DPD) deficiency -infection (especially a virus infection such as chickenpox, cold sores, or herpes) -kidney disease -liver disease -malnourished, poor nutrition -recent or ongoing radiation therapy -an unusual or allergic  reaction to fluorouracil, other chemotherapy, other medicines, foods, dyes, or preservatives -pregnant or trying to get pregnant -breast-feeding How should I use this medicine? This drug is given as an infusion or injection into a vein. It is administered in a hospital or clinic by a specially trained health care professional. Talk to your pediatrician regarding the use of this medicine in children. Special care may be needed. Overdosage: If you think you have taken too much of this medicine contact a poison control center or  emergency room at once. NOTE: This medicine is only for you. Do not share this medicine with others. What if I miss a dose? It is important not to miss your dose. Call your doctor or health care professional if you are unable to keep an appointment. What may interact with this medicine? -allopurinol -cimetidine -dapsone -digoxin -hydroxyurea -leucovorin -levamisole -medicines for seizures like ethotoin, fosphenytoin, phenytoin -medicines to increase blood counts like filgrastim, pegfilgrastim, sargramostim -medicines that treat or prevent blood clots like warfarin, enoxaparin, and dalteparin -methotrexate -metronidazole -pyrimethamine -some other chemotherapy drugs like busulfan, cisplatin, estramustine, vinblastine -trimethoprim -trimetrexate -vaccines Talk to your doctor or health care professional before taking any of these medicines: -acetaminophen -aspirin -ibuprofen -ketoprofen -naproxen This list may not describe all possible interactions. Give your health care provider a list of all the medicines, herbs, non-prescription drugs, or dietary supplements you use. Also tell them if you smoke, drink alcohol, or use illegal drugs. Some items may interact with your medicine. What should I watch for while using this medicine? Visit your doctor for checks on your progress. This drug may make you feel generally unwell. This is not uncommon, as chemotherapy can affect healthy cells as well as cancer cells. Report any side effects. Continue your course of treatment even though you feel ill unless your doctor tells you to stop. In some cases, you may be given additional medicines to help with side effects. Follow all directions for their use. Call your doctor or health care professional for advice if you get a fever, chills or sore throat, or other symptoms of a cold or flu. Do not treat yourself. This drug decreases your body's ability to fight infections. Try to avoid being around people  who are sick. This medicine may increase your risk to bruise or bleed. Call your doctor or health care professional if you notice any unusual bleeding. Be careful brushing and flossing your teeth or using a toothpick because you may get an infection or bleed more easily. If you have any dental work done, tell your dentist you are receiving this medicine. Avoid taking products that contain aspirin, acetaminophen, ibuprofen, naproxen, or ketoprofen unless instructed by your doctor. These medicines may hide a fever. Do not become pregnant while taking this medicine. Women should inform their doctor if they wish to become pregnant or think they might be pregnant. There is a potential for serious side effects to an unborn child. Talk to your health care professional or pharmacist for more information. Do not breast-feed an infant while taking this medicine. Men should inform their doctor if they wish to father a child. This medicine may lower sperm counts. Do not treat diarrhea with over the counter products. Contact your doctor if you have diarrhea that lasts more than 2 days or if it is severe and watery. This medicine can make you more sensitive to the sun. Keep out of the sun. If you cannot avoid being in the sun, wear protective clothing and use sunscreen. Do  not use sun lamps or tanning beds/booths. What side effects may I notice from receiving this medicine? Side effects that you should report to your doctor or health care professional as soon as possible: -allergic reactions like skin rash, itching or hives, swelling of the face, lips, or tongue -low blood counts - this medicine may decrease the number of white blood cells, red blood cells and platelets. You may be at increased risk for infections and bleeding. -signs of infection - fever or chills, cough, sore throat, pain or difficulty passing urine -signs of decreased platelets or bleeding - bruising, pinpoint red spots on the skin, black, tarry  stools, blood in the urine -signs of decreased red blood cells - unusually weak or tired, fainting spells, lightheadedness -breathing problems -changes in vision -chest pain -mouth sores -nausea and vomiting -pain, swelling, redness at site where injected -pain, tingling, numbness in the hands or feet -redness, swelling, or sores on hands or feet -stomach pain -unusual bleeding Side effects that usually do not require medical attention (report to your doctor or health care professional if they continue or are bothersome): -changes in finger or toe nails -diarrhea -dry or itchy skin -hair loss -headache -loss of appetite -sensitivity of eyes to the light -stomach upset -unusually teary eyes This list may not describe all possible side effects. Call your doctor for medical advice about side effects. You may report side effects to FDA at 1-800-FDA-1088. Where should I keep my medicine? This drug is given in a hospital or clinic and will not be stored at home. NOTE: This sheet is a summary. It may not cover all possible information. If you have questions about this medicine, talk to your doctor, pharmacist, or health care provider.  2015, Elsevier/Gold Standard. (2007-11-30 13:53:16)

## 2014-06-11 NOTE — Telephone Encounter (Signed)
gv and printed appt sched and avs for pt for NOV...sed added tx. °

## 2014-06-12 ENCOUNTER — Telehealth: Payer: Self-pay | Admitting: *Deleted

## 2014-06-12 NOTE — Telephone Encounter (Signed)
PT. IS EATING AND FORCING FLUIDS. SLIGHT NAUSEA WHICH WAS HELPED WITH ZOFRAN. NO VOMITING. NO MOUTH ISSUES. RECTAL PAIN IS IMPROVING BUT STILL SOME PAIN AND SWELLING IF PT. STANDS TOO LONG. PAIN IS RELIEVED WITH THE OXYCODONE. PT. WILL CALL THIS OFFICE AT ANY TIME WITH PROBLEMS OR CONCERNS.

## 2014-06-13 ENCOUNTER — Ambulatory Visit (HOSPITAL_BASED_OUTPATIENT_CLINIC_OR_DEPARTMENT_OTHER): Payer: 59

## 2014-06-13 DIAGNOSIS — Z452 Encounter for adjustment and management of vascular access device: Secondary | ICD-10-CM

## 2014-06-13 DIAGNOSIS — C20 Malignant neoplasm of rectum: Secondary | ICD-10-CM

## 2014-06-13 MED ORDER — HEPARIN SOD (PORK) LOCK FLUSH 100 UNIT/ML IV SOLN
500.0000 [IU] | Freq: Once | INTRAVENOUS | Status: AC | PRN
  Administered 2014-06-13: 500 [IU]
  Filled 2014-06-13: qty 5

## 2014-06-13 MED ORDER — SODIUM CHLORIDE 0.9 % IJ SOLN
10.0000 mL | INTRAMUSCULAR | Status: DC | PRN
  Administered 2014-06-13: 10 mL
  Filled 2014-06-13: qty 10

## 2014-06-15 ENCOUNTER — Telehealth: Payer: Self-pay | Admitting: *Deleted

## 2014-06-15 DIAGNOSIS — C20 Malignant neoplasm of rectum: Secondary | ICD-10-CM

## 2014-06-15 MED ORDER — FLUCONAZOLE 100 MG PO TABS: 100.0000 mg | ORAL_TABLET | Freq: Every day | ORAL | Status: DC

## 2014-06-15 NOTE — Telephone Encounter (Signed)
White coating on his tongue is better, but still there and bothering him-bad taste in mouth. Will refill Diflucan for a 7 day course. Asking why the 4 days did not resolve it? Made him aware that his resistance has been affected by the chemo and his nutrition has not been optimal and this can lower his normal immune response to normal flora.

## 2014-06-24 ENCOUNTER — Other Ambulatory Visit: Payer: Self-pay | Admitting: Oncology

## 2014-06-25 ENCOUNTER — Other Ambulatory Visit: Payer: Self-pay | Admitting: *Deleted

## 2014-06-25 ENCOUNTER — Telehealth: Payer: Self-pay | Admitting: Oncology

## 2014-06-25 ENCOUNTER — Encounter (HOSPITAL_BASED_OUTPATIENT_CLINIC_OR_DEPARTMENT_OTHER): Payer: 59 | Admitting: Oncology

## 2014-06-25 ENCOUNTER — Other Ambulatory Visit: Payer: 59

## 2014-06-25 ENCOUNTER — Ambulatory Visit (HOSPITAL_BASED_OUTPATIENT_CLINIC_OR_DEPARTMENT_OTHER): Payer: 59 | Admitting: Oncology

## 2014-06-25 ENCOUNTER — Ambulatory Visit (HOSPITAL_BASED_OUTPATIENT_CLINIC_OR_DEPARTMENT_OTHER): Payer: 59

## 2014-06-25 ENCOUNTER — Ambulatory Visit: Payer: 59 | Admitting: Nutrition

## 2014-06-25 ENCOUNTER — Ambulatory Visit: Payer: 59 | Admitting: Nurse Practitioner

## 2014-06-25 VITALS — BP 132/73 | HR 95 | Temp 98.1°F | Resp 19 | Ht 74.0 in | Wt 146.8 lb

## 2014-06-25 DIAGNOSIS — C20 Malignant neoplasm of rectum: Secondary | ICD-10-CM

## 2014-06-25 DIAGNOSIS — Z5111 Encounter for antineoplastic chemotherapy: Secondary | ICD-10-CM

## 2014-06-25 DIAGNOSIS — G893 Neoplasm related pain (acute) (chronic): Secondary | ICD-10-CM

## 2014-06-25 LAB — COMPREHENSIVE METABOLIC PANEL (CC13)
ALT: 25 U/L (ref 0–55)
AST: 14 U/L (ref 5–34)
Albumin: 3.6 g/dL (ref 3.5–5.0)
Alkaline Phosphatase: 136 U/L (ref 40–150)
Anion Gap: 9 mEq/L (ref 3–11)
BILIRUBIN TOTAL: 0.75 mg/dL (ref 0.20–1.20)
BUN: 9.5 mg/dL (ref 7.0–26.0)
CO2: 26 mEq/L (ref 22–29)
Calcium: 9.6 mg/dL (ref 8.4–10.4)
Chloride: 104 mEq/L (ref 98–109)
Creatinine: 0.9 mg/dL (ref 0.7–1.3)
GLUCOSE: 128 mg/dL (ref 70–140)
Potassium: 3.7 mEq/L (ref 3.5–5.1)
Sodium: 139 mEq/L (ref 136–145)
TOTAL PROTEIN: 7.1 g/dL (ref 6.4–8.3)

## 2014-06-25 LAB — CBC WITH DIFFERENTIAL/PLATELET
BASO%: 1 % (ref 0.0–2.0)
BASOS ABS: 0 10*3/uL (ref 0.0–0.1)
EOS ABS: 0.1 10*3/uL (ref 0.0–0.5)
EOS%: 2.7 % (ref 0.0–7.0)
HEMATOCRIT: 43 % (ref 38.4–49.9)
HEMOGLOBIN: 14.3 g/dL (ref 13.0–17.1)
LYMPH%: 13.3 % — AB (ref 14.0–49.0)
MCH: 30.4 pg (ref 27.2–33.4)
MCHC: 33.2 g/dL (ref 32.0–36.0)
MCV: 91.7 fL (ref 79.3–98.0)
MONO#: 0.6 10*3/uL (ref 0.1–0.9)
MONO%: 12.5 % (ref 0.0–14.0)
NEUT%: 70.5 % (ref 39.0–75.0)
NEUTROS ABS: 3.5 10*3/uL (ref 1.5–6.5)
PLATELETS: 208 10*3/uL (ref 140–400)
RBC: 4.69 10*6/uL (ref 4.20–5.82)
RDW: 13.5 % (ref 11.0–14.6)
WBC: 4.9 10*3/uL (ref 4.0–10.3)
lymph#: 0.7 10*3/uL — ABNORMAL LOW (ref 0.9–3.3)

## 2014-06-25 MED ORDER — OXALIPLATIN CHEMO INJECTION 100 MG/20ML
85.0000 mg/m2 | Freq: Once | INTRAVENOUS | Status: AC
  Administered 2014-06-25: 160 mg via INTRAVENOUS
  Filled 2014-06-25: qty 32

## 2014-06-25 MED ORDER — FLUOROURACIL CHEMO INJECTION 2.5 GM/50ML
400.0000 mg/m2 | Freq: Once | INTRAVENOUS | Status: AC
  Administered 2014-06-25: 750 mg via INTRAVENOUS
  Filled 2014-06-25: qty 15

## 2014-06-25 MED ORDER — LEUCOVORIN CALCIUM INJECTION 350 MG
400.0000 mg/m2 | Freq: Once | INTRAVENOUS | Status: AC
  Administered 2014-06-25: 760 mg via INTRAVENOUS
  Filled 2014-06-25: qty 38

## 2014-06-25 MED ORDER — OXYCODONE HCL 5 MG PO TABS: 5.0000 mg | ORAL_TABLET | ORAL | Status: DC | PRN

## 2014-06-25 MED ORDER — ONDANSETRON 8 MG/50ML IVPB (CHCC)
8.0000 mg | Freq: Once | INTRAVENOUS | Status: AC
  Administered 2014-06-25: 8 mg via INTRAVENOUS

## 2014-06-25 MED ORDER — ONDANSETRON 8 MG/NS 50 ML IVPB
INTRAVENOUS | Status: AC
  Filled 2014-06-25: qty 8

## 2014-06-25 MED ORDER — SODIUM CHLORIDE 0.9 % IV SOLN
2400.0000 mg/m2 | INTRAVENOUS | Status: DC
  Administered 2014-06-25: 4550 mg via INTRAVENOUS
  Filled 2014-06-25: qty 91

## 2014-06-25 MED ORDER — DEXAMETHASONE SODIUM PHOSPHATE 10 MG/ML IJ SOLN
INTRAMUSCULAR | Status: AC
  Filled 2014-06-25: qty 1

## 2014-06-25 MED ORDER — DEXAMETHASONE SODIUM PHOSPHATE 10 MG/ML IJ SOLN
10.0000 mg | Freq: Once | INTRAMUSCULAR | Status: AC
Start: 2014-06-25 — End: 2014-06-25
  Administered 2014-06-25: 10 mg via INTRAVENOUS

## 2014-06-25 MED ORDER — DEXTROSE 5 % IV SOLN
Freq: Once | INTRAVENOUS | Status: AC
  Administered 2014-06-25: 10:00:00 via INTRAVENOUS

## 2014-06-25 NOTE — Progress Notes (Signed)
  [facility] OFFICE PROGRESS NOTE   Diagnosis: rectal cancer  INTERVAL HISTORY:   Seth Villanueva returns as scheduled. He completed a first cycle of FOLFOX 06/11/2014. No nausea/vomiting, mouth sores, or diarrhea. The ileostomy stool is semi-formed. He continues to have a full feeling and discomfort at the rectum. He is taking oxycodone. The white exudate over the tongue improved partially with Diflucan. He is taking various "juices "for nutrition. No neuropathy symptoms.  Objective:  Vital signs in last 24 hours:  Blood pressure 132/73, pulse 95, temperature 98.1 F (36.7 C), temperature source Oral, resp. rate 19, height $RemoveBe'6\' 2"'lBBzAcyGl$  (1.88 m), weight 146 lb 12.8 oz (66.588 kg).    HEENT: mild whitecoat over the tongue, no ulcers, no buccal thrush Resp: lungs clear bilaterally Cardio: regular rate and rhythm GI: no hepatomegaly, minimal amount of stool in the ileostomy bag, nontender, external examination of the perineum and anal verge is unremarkable Vascular: no leg edema  Skin:superficial opening at a right lower quadrant laparoscopy incision   Portacath/PICC-without erythema  Lab Results:  Lab Results  Component Value Date   WBC 4.9 06/25/2014   HGB 14.3 06/25/2014   HCT 43.0 06/25/2014   MCV 91.7 06/25/2014   PLT 208 06/25/2014   NEUTROABS 3.5 06/25/2014      Lab Results  Component Value Date   CEA 2.4 02/01/2014    Imaging:  No results found.  Medications: I have reviewed the patient's current medications.  Assessment/Plan: 1. 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 Completion of radiation and concurrent Xeloda 03/28/2014.  Low anterior resection, diverting loop ileostomy, and coloanal anastomosis 05/18/2014,ypT2,ypN2, no loss of mismatch repair protein expression  Cycle 1 adjuvant FOLFOX 06/11/2014 2. History Rectal pain secondary to #1.   Disposition:  [name] tolerated the first cycle of FOLFOX without significant acute toxicity. He appears to be managing the ileostomy well. He will meet with the [facility] nutritionist today. I encouraged him to return to usual foods and increase his protein intake. The rectal discomfort is most likely related to surgery.  He will schedule an appointment at the surgical office to evaluate the superficial opening at the right lower abdominal wound. This may require Steri-Strips.  [name] will return for an office visit and chemotherapy in 2 weeks.  Betsy Coder, MD  06/25/2014  8:33 AM

## 2014-06-25 NOTE — Progress Notes (Signed)
Nutrition follow up completed with patient and mother, during chemotherapy. Weight decreased and documented as 146.8 pounds November 16.  BMI is now 18.84.  Patient is underweight. Patient receiving FOLFOX today. Patient reports adequate stools, which are" pudding-like" from ileostomy. Patient has been diagnosed with oral candidiasis. Patient has been juicing using fruits and vegetables.  He reports early satiety. Mother states protein powder patient was using before cancer diagnosis states on the label that it "causes cancer."  This is why he stopped using it.  Nutrition diagnosis: Unintended weight loss continues.  Intervention: Educated patient to increase calories and protein throughout the day in small, frequent meals and snacks. Recommended patient add protein to his juice.  Recommended several protein powders for patient to try and provided samples. Recommended patient try ENU oral nutrition supplement for additional calories and protein. Educated patient on strategies for improving appetite. Questions were answered.  Teach back method used.  Monitoring, evaluation, goals: Patient will tolerate increased calories and protein to minimize further weight loss.  Next visit:Monday, November 30, during chemotherapy.  **Disclaimer: This note was dictated with voice recognition software. Similar sounding words can inadvertently be transcribed and this note may contain transcription errors which may not have been corrected upon publication of note.**

## 2014-06-25 NOTE — Telephone Encounter (Signed)
added pt appts....pt will get new sched in tx room

## 2014-06-25 NOTE — Progress Notes (Signed)
WOC ostomy follow up Stoma type/location: RLQ ileostomy Stomal assessment/size: 1 and 1/8 inches slightly oval at rest.  Center portion of mucosa is more elevated. Peristomal assessment: intact, clear Treatment options for stomal/peristomal skin: skin barrier ring Output: thick liquid brown stool Ostomy pouching: 1pc. with convexity and skin barrier ring Education provided: I re measured stoma and provided guidance for parents about sizing.  Encouraged to use skin barrier ring.  Discussed that change schedule should remain at twice weekly (Mondays and Thursdays) and that no wipes, barrier films or removers are required.  All verbalized understanding.  I connected with [employer] representative following today's encounter and requested that they both send him pre-cut samples of 1 and 1/8 inch convex ostomy pouches and skin barrier rings, but that they also make setting him up with a supplier a priority.  (Spoke with Hilliard Clark.) Enrolled patient in [facility] Start Discharge program: Yes [facility] nursing team will not follow as an outpatient on a scheduled basis, but will remain available to this patient, the nursing, srugical and medical teams.  Please re-consult if needed. Thanks, Maudie Flakes, MSN, RN, [place], [place], [place] [phone])

## 2014-06-25 NOTE — Progress Notes (Signed)
1345- Patient discharged with parents, in no acute distress, ambulatory. Wound ostomy RN has just finished with patient's iliostomy.

## 2014-06-25 NOTE — Patient Instructions (Signed)
[facility] Discharge Instructions for Patients Receiving Chemotherapy  Today you received the following chemotherapy agents: Leucovorin, Oxaliplatin, 5FU  To help prevent nausea and vomiting after your treatment, we encourage you to take your nausea medication as prescribed by your physician.   If you develop nausea and vomiting that is not controlled by your nausea medication, call the clinic.   BELOW ARE SYMPTOMS THAT SHOULD BE REPORTED IMMEDIATELY:  *FEVER GREATER THAN 100.5 F  *CHILLS WITH OR WITHOUT FEVER  NAUSEA AND VOMITING THAT IS NOT CONTROLLED WITH YOUR NAUSEA MEDICATION  *UNUSUAL SHORTNESS OF BREATH  *UNUSUAL BRUISING OR BLEEDING  TENDERNESS IN MOUTH AND THROAT WITH OR WITHOUT PRESENCE OF ULCERS  *URINARY PROBLEMS  *BOWEL PROBLEMS  UNUSUAL RASH Items with * indicate a potential emergency and should be followed up as soon as possible.  Feel free to call the clinic you have any questions or concerns. The clinic phone number is [phone].    

## 2014-06-27 ENCOUNTER — Telehealth: Payer: Self-pay | Admitting: *Deleted

## 2014-06-27 ENCOUNTER — Ambulatory Visit (HOSPITAL_BASED_OUTPATIENT_CLINIC_OR_DEPARTMENT_OTHER): Payer: 59

## 2014-06-27 DIAGNOSIS — C20 Malignant neoplasm of rectum: Secondary | ICD-10-CM

## 2014-06-27 DIAGNOSIS — Z452 Encounter for adjustment and management of vascular access device: Secondary | ICD-10-CM

## 2014-06-27 MED ORDER — SODIUM CHLORIDE 0.9 % IJ SOLN
10.0000 mL | INTRAMUSCULAR | Status: DC | PRN
  Administered 2014-06-27: 10 mL
  Filled 2014-06-27: qty 10

## 2014-06-27 MED ORDER — HEPARIN SOD (PORK) LOCK FLUSH 100 UNIT/ML IV SOLN
500.0000 [IU] | Freq: Once | INTRAVENOUS | Status: AC | PRN
  Administered 2014-06-27: 500 [IU]
  Filled 2014-06-27: qty 5

## 2014-06-27 NOTE — Telephone Encounter (Signed)
Pt called requesting letter from MD re: disability; "please ask Dr. Benay Spice to write me out for another month from work"  Pt states he will call back with fax # for office to "please fax by this Friday"  Note to Dr. Benay Spice.

## 2014-06-28 ENCOUNTER — Encounter: Payer: Self-pay | Admitting: Oncology

## 2014-06-28 NOTE — Telephone Encounter (Signed)
Notified pt that disability letter has been faxed to the # provided by him (# [phone]).  Pt verbalized understanding and expressed appreciation for call.

## 2014-06-29 ENCOUNTER — Encounter: Payer: Self-pay | Admitting: Oncology

## 2014-06-29 ENCOUNTER — Encounter: Payer: Self-pay | Admitting: Genetic Counselor

## 2014-06-29 NOTE — Progress Notes (Signed)
Faxed clinical notes to [facility] @ [phone]

## 2014-06-29 NOTE — Progress Notes (Signed)
GENETIC TEST RESULTS  Patient Name: Seth Villanueva. Patient Age: 33 y.o. Encounter Date: 06/29/2014  Referring Physician: Betsy Coder, MD   Mr. Jacober was called today to discuss genetic test results. Please see the Genetics note from his visit on 06/07/14 for a detailed discussion of his personal and family history.  GENETIC TESTING: At the time of Mr. Maloof' visit, we recommended he pursue genetic testing of multiple genes on the [employer] gene panel. This test, which included sequencing and deletion/duplication analysis of 17 genes, was performed at [facility]. Testing was normal and did not reveal a mutation in these genes. The genes tested were APC, BMPR1A, CDH1, CHEK2, EPCAM, GREM1, MLH1, MSH2, MSH6, MUTYH, PMS2, POLD1, POLE, PTEN, SMAD4, STK11, and TP53.  We discussed with Mr. Vallance that since the current test is not perfect, it is possible there may be a gene mutation that current testing cannot detect, but that chance is small. We also discussed that it is possible that a different genetic factor, which was not part of this testing or has not yet been discovered, is responsible for the cancer diagnoses in the family. Given his family history, the likelihood of this is also low. Should Mr. Oquinn wish to discuss or pursue this additional testing, we are happy to coordinate this at any time, but do not feel that she is at significant risk of harboring a mutation in a different gene.     CANCER SCREENING: Even though Mr. Maimone was diagnosed with rectal cancer at a young age, his normal result is reassuring and indicates that he does not likely have an increased risk of cancer due to a mutation in one of these genes. We recommended Mr. Lamm continue to follow the cancer screening guidelines provided by his primary physician.   FAMILY MEMBERS: Women and men in the family are at some increased risk of developing colon cancer, over the general population risk, simply due to  the family history. They should speak with their own providers to discuss when would be the best time to initiate colon cancer screening.  Lastly, we discussed with Mr. Slivinski that cancer genetics is a rapidly advancing field and it is possible that new genetic tests will be appropriate for him in the future. We encouraged him to remain in contact with [place] on an annual basis so we can update his personal and family histories, and let him know of advances in cancer genetics that may benefit the family. Our contact number was provided. Mr. Stahl questions were answered to his satisfaction today, and he knows he is welcome to call anytime with additional questions.    Steele Berg, MS, [facility] Certified Genetic Counselor phone: [phone] [email or web address]

## 2014-07-04 ENCOUNTER — Other Ambulatory Visit: Payer: Self-pay | Admitting: *Deleted

## 2014-07-04 DIAGNOSIS — C20 Malignant neoplasm of rectum: Secondary | ICD-10-CM

## 2014-07-04 MED ORDER — OXYCODONE HCL 5 MG PO TABS: 5.0000 mg | ORAL_TABLET | ORAL | Status: DC | PRN

## 2014-07-04 NOTE — Telephone Encounter (Signed)
VM requesting refill on oxycodone-will be out by weekend-office closed on Thursday. Will need his dad to pick up.

## 2014-07-06 ENCOUNTER — Other Ambulatory Visit: Payer: Self-pay | Admitting: Oncology

## 2014-07-09 ENCOUNTER — Ambulatory Visit (HOSPITAL_BASED_OUTPATIENT_CLINIC_OR_DEPARTMENT_OTHER): Payer: 59 | Admitting: Nurse Practitioner

## 2014-07-09 ENCOUNTER — Ambulatory Visit: Payer: 59 | Admitting: Nutrition

## 2014-07-09 ENCOUNTER — Ambulatory Visit (HOSPITAL_BASED_OUTPATIENT_CLINIC_OR_DEPARTMENT_OTHER): Payer: 59

## 2014-07-09 ENCOUNTER — Other Ambulatory Visit (HOSPITAL_BASED_OUTPATIENT_CLINIC_OR_DEPARTMENT_OTHER): Payer: 59

## 2014-07-09 VITALS — BP 127/72 | HR 86 | Temp 98.1°F | Resp 19 | Ht 74.0 in | Wt 153.1 lb

## 2014-07-09 DIAGNOSIS — C20 Malignant neoplasm of rectum: Secondary | ICD-10-CM

## 2014-07-09 DIAGNOSIS — K148 Other diseases of tongue: Secondary | ICD-10-CM

## 2014-07-09 DIAGNOSIS — Z5111 Encounter for antineoplastic chemotherapy: Secondary | ICD-10-CM

## 2014-07-09 LAB — CBC WITH DIFFERENTIAL/PLATELET
BASO%: 1.1 % (ref 0.0–2.0)
Basophils Absolute: 0.1 10*3/uL (ref 0.0–0.1)
EOS%: 2.6 % (ref 0.0–7.0)
Eosinophils Absolute: 0.1 10*3/uL (ref 0.0–0.5)
HEMATOCRIT: 38.9 % (ref 38.4–49.9)
HGB: 13 g/dL (ref 13.0–17.1)
LYMPH#: 0.6 10*3/uL — AB (ref 0.9–3.3)
LYMPH%: 10.6 % — ABNORMAL LOW (ref 14.0–49.0)
MCH: 30.2 pg (ref 27.2–33.4)
MCHC: 33.3 g/dL (ref 32.0–36.0)
MCV: 90.5 fL (ref 79.3–98.0)
MONO#: 0.9 10*3/uL (ref 0.1–0.9)
MONO%: 15.9 % — ABNORMAL HIGH (ref 0.0–14.0)
NEUT#: 3.7 10*3/uL (ref 1.5–6.5)
NEUT%: 69.8 % (ref 39.0–75.0)
Platelets: 144 10*3/uL (ref 140–400)
RBC: 4.3 10*6/uL (ref 4.20–5.82)
RDW: 13.7 % (ref 11.0–14.6)
WBC: 5.4 10*3/uL (ref 4.0–10.3)

## 2014-07-09 LAB — COMPREHENSIVE METABOLIC PANEL (CC13)
ALT: 33 U/L (ref 0–55)
ANION GAP: 10 meq/L (ref 3–11)
AST: 21 U/L (ref 5–34)
Albumin: 3.5 g/dL (ref 3.5–5.0)
Alkaline Phosphatase: 153 U/L — ABNORMAL HIGH (ref 40–150)
BUN: 14 mg/dL (ref 7.0–26.0)
CALCIUM: 9.2 mg/dL (ref 8.4–10.4)
CO2: 28 meq/L (ref 22–29)
Chloride: 106 mEq/L (ref 98–109)
Creatinine: 0.9 mg/dL (ref 0.7–1.3)
Glucose: 91 mg/dl (ref 70–140)
Potassium: 3.9 mEq/L (ref 3.5–5.1)
SODIUM: 143 meq/L (ref 136–145)
TOTAL PROTEIN: 6.6 g/dL (ref 6.4–8.3)
Total Bilirubin: 0.38 mg/dL (ref 0.20–1.20)

## 2014-07-09 MED ORDER — SODIUM CHLORIDE 0.9 % IV SOLN
2400.0000 mg/m2 | INTRAVENOUS | Status: DC
  Administered 2014-07-09: 4550 mg via INTRAVENOUS
  Filled 2014-07-09: qty 91

## 2014-07-09 MED ORDER — OXYCODONE-ACETAMINOPHEN 5-325 MG PO TABS
ORAL_TABLET | ORAL | Status: AC
  Filled 2014-07-09: qty 1

## 2014-07-09 MED ORDER — OXYCODONE HCL 5 MG PO TABS: 5.0000 mg | ORAL_TABLET | ORAL | Status: DC | PRN

## 2014-07-09 MED ORDER — CLOTRIMAZOLE 10 MG MT TROC: 10.0000 mg | Freq: Four times a day (QID) | OROMUCOSAL | Status: DC

## 2014-07-09 MED ORDER — DEXTROSE 5 % IV SOLN
Freq: Once | INTRAVENOUS | Status: AC
  Administered 2014-07-09: 12:00:00 via INTRAVENOUS

## 2014-07-09 MED ORDER — FLUOROURACIL CHEMO INJECTION 2.5 GM/50ML
400.0000 mg/m2 | Freq: Once | INTRAVENOUS | Status: AC
  Administered 2014-07-09: 750 mg via INTRAVENOUS
  Filled 2014-07-09: qty 15

## 2014-07-09 MED ORDER — OXYCODONE-ACETAMINOPHEN 5-325 MG PO TABS
1.0000 | ORAL_TABLET | Freq: Once | ORAL | Status: AC
  Administered 2014-07-09: 1 via ORAL

## 2014-07-09 MED ORDER — ONDANSETRON 8 MG/50ML IVPB (CHCC)
8.0000 mg | Freq: Once | INTRAVENOUS | Status: AC
  Administered 2014-07-09: 8 mg via INTRAVENOUS

## 2014-07-09 MED ORDER — LEUCOVORIN CALCIUM INJECTION 350 MG
400.0000 mg/m2 | Freq: Once | INTRAVENOUS | Status: AC
  Administered 2014-07-09: 760 mg via INTRAVENOUS
  Filled 2014-07-09: qty 38

## 2014-07-09 MED ORDER — DEXAMETHASONE SODIUM PHOSPHATE 10 MG/ML IJ SOLN
10.0000 mg | Freq: Once | INTRAMUSCULAR | Status: AC
  Administered 2014-07-09: 10 mg via INTRAVENOUS

## 2014-07-09 MED ORDER — DEXAMETHASONE SODIUM PHOSPHATE 10 MG/ML IJ SOLN
INTRAMUSCULAR | Status: AC
  Filled 2014-07-09: qty 1

## 2014-07-09 MED ORDER — ONDANSETRON 8 MG/NS 50 ML IVPB
INTRAVENOUS | Status: AC
Start: 2014-07-09 — End: 2014-07-09
  Filled 2014-07-09: qty 8

## 2014-07-09 MED ORDER — OXALIPLATIN CHEMO INJECTION 100 MG/20ML
85.0000 mg/m2 | Freq: Once | INTRAVENOUS | Status: AC
  Administered 2014-07-09: 160 mg via INTRAVENOUS
  Filled 2014-07-09: qty 32

## 2014-07-09 NOTE — Progress Notes (Signed)
  [facility] OFFICE PROGRESS NOTE   Diagnosis:  Rectal cancer  INTERVAL HISTORY:   Seth Villanueva returns as scheduled. He completed cycle 2 FOLFOX 06/25/2014. He has occasional mild nausea. No mouth sores. No diarrhea. He continues to note a white coating over the tongue which tends to improve following a course of Diflucan. He noted mild numbness and numbness in the fingertips and toes for about 1 hour following the most recent chemotherapy. No persistent neuropathy symptoms. He notes increased pain at the rectum following chemotherapy. He takes oxycodone as needed.  Objective:  Vital signs in last 24 hours:  Blood pressure 127/72, pulse 86, temperature 98.1 F (36.7 C), temperature source Oral, resp. rate 19, height $RemoveBe'6\' 2"'FTqnnwYWc$  (1.88 m), weight 153 lb 1.6 oz (69.446 kg), SpO2 100 %.    HEENT: mild white coating over tongue. Resp: lungs Villanueva bilaterally. Cardio: regular rate and rhythm. GI: abdomen soft and nontender. No hepatomegaly. Right lower quadrant ileostomy. Thick stool in the collection bag. Vascular: no leg edema.  Skin: small superficial opening at the right lower quadrant.    Lab Results:  Lab Results  Component Value Date   WBC 5.4 07/09/2014   HGB 13.0 07/09/2014   HCT 38.9 07/09/2014   MCV 90.5 07/09/2014   PLT 144 07/09/2014   NEUTROABS 3.7 07/09/2014    Imaging:  No results found.  Medications: I have reviewed the patient's current medications.  Assessment/Plan: 1. 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 Completion of radiation  and concurrent Xeloda 03/28/2014.  Low anterior resection, diverting loop ileostomy, and coloanal anastomosis 05/18/2014,ypT2,ypN2, no loss of mismatch repair protein expression  Cycle 1 adjuvant FOLFOX 06/11/2014  Cycle 2 adjuvant FOLFOX 06/25/2014 2. History of rectal pain secondary to #1.   Disposition: Seth Villanueva appears stable. He has completed 2 cycles of adjuvant FOLFOX. Plan to proceed with cycle 3 today as scheduled. He will return for a followup visit and cycle 4 in 2 weeks. He will contact the office in the interim with any problems.  For the recurrent ? yeast over his tongue we will try Mycelex troches.  Plan reviewed with Dr. Benay Spice.    Ned Card ANP/GNP-BC   07/09/2014  11:30 AM

## 2014-07-09 NOTE — Progress Notes (Signed)
Nutrition followup completed with patient and mother. Weight has increased and was documented as 153.1 pounds November 30.  Increased from 146.8 pounds November 16. Patient reports stools are adequate and are of proper consistency from ileostomy. Patient continues to juice fruits and vegetables, and states he feels really good when he consumes these drinks. Patient also reports consuming ENU oral nutrition supplements for additional calories and protein. Appetite and oral intake improved.  Nutrition diagnosis: Unintended weight loss improved.  Intervention: Patient educated to continue calories and protein throughout the day in small, frequent meals and snacks. Patient recommended to continue ENU oral nutrition supplements. Ordering information was provided. Questions were answered.  Teach back method used.  Monitoring, evaluation, goals: Patient will work to continue increased oral intake to promote weight maintenance or gain.  Next visit: Monday, December 14, during chemotherapy.  **Disclaimer: This note was dictated with voice recognition software. Similar sounding words can inadvertently be transcribed and this note may contain transcription errors which may not have been corrected upon publication of note.**

## 2014-07-09 NOTE — Patient Instructions (Signed)
[facility] Discharge Instructions for Patients Receiving Chemotherapy  Today you received the following chemotherapy agents Oxaliplatini/Leucovorin/5FU.  To help prevent nausea and vomiting after your treatment, we encourage you to take your nausea medication as prescribed.   If you develop nausea and vomiting that is not controlled by your nausea medication, call the clinic.   BELOW ARE SYMPTOMS THAT SHOULD BE REPORTED IMMEDIATELY:  *FEVER GREATER THAN 100.5 F  *CHILLS WITH OR WITHOUT FEVER  NAUSEA AND VOMITING THAT IS NOT CONTROLLED WITH YOUR NAUSEA MEDICATION  *UNUSUAL SHORTNESS OF BREATH  *UNUSUAL BRUISING OR BLEEDING  TENDERNESS IN MOUTH AND THROAT WITH OR WITHOUT PRESENCE OF ULCERS  *URINARY PROBLEMS  *BOWEL PROBLEMS  UNUSUAL RASH Items with * indicate a potential emergency and should be followed up as soon as possible.  Feel free to call the clinic you have any questions or concerns. The clinic phone number is [phone].

## 2014-07-10 ENCOUNTER — Encounter: Payer: Self-pay | Admitting: Oncology

## 2014-07-10 NOTE — Progress Notes (Signed)
Faxed clinical notes to [facility] @ [phone]

## 2014-07-11 ENCOUNTER — Ambulatory Visit (HOSPITAL_BASED_OUTPATIENT_CLINIC_OR_DEPARTMENT_OTHER): Payer: 59

## 2014-07-11 DIAGNOSIS — C20 Malignant neoplasm of rectum: Secondary | ICD-10-CM

## 2014-07-11 MED ORDER — SODIUM CHLORIDE 0.9 % IJ SOLN
10.0000 mL | INTRAMUSCULAR | Status: DC | PRN
  Administered 2014-07-11: 10 mL
  Filled 2014-07-11: qty 10

## 2014-07-11 MED ORDER — HEPARIN SOD (PORK) LOCK FLUSH 100 UNIT/ML IV SOLN
500.0000 [IU] | Freq: Once | INTRAVENOUS | Status: AC | PRN
  Administered 2014-07-11: 500 [IU]
  Filled 2014-07-11: qty 5

## 2014-07-11 NOTE — Patient Instructions (Signed)
Fluorouracil, 5FU; Diclofenac topical cream What is this medicine? FLUOROURACIL; DICLOFENAC (flure oh YOOR a sil; dye KLOE fen ak) is a combination of a topical chemotherapy agent and non-steroidal anti-inflammatory drug (NSAID). It is used on the skin to treat skin cancer and skin conditions that could become cancer. This medicine may be used for other purposes; ask your health care provider or pharmacist if you have questions. COMMON BRAND NAME(S): FLUORAC What should I tell my health care provider before I take this medicine? They need to know if you have any of these conditions: -bleeding problems -cigarette smoker -DPD enzyme deficiency -heart disease -high blood pressure -if you frequently drink alcohol containing drinks -kidney disease -liver disease -open or infected skin -stomach problems -swelling or open sores at the treatment site -recent or planned coronary artery bypass graft (CABG) surgery -an unusual or allergic reaction to fluorouracil, diclofenac, aspirin, other NSAIDs, other medicines, foods, dyes, or preservatives -pregnant or trying to get pregnant -breast-feeding How should I use this medicine? This medicine is only for use on the skin. Follow the directions on the prescription label. Wash hands before and after use. Wash affected area and gently pat dry. To apply this medicine use a cotton-tipped applicator, or use gloves if applying with fingertips. If applied with unprotected fingertips, it is very important to wash your hands well after you apply this medicine. Avoid applying to the eyes, nose, or mouth. Apply enough medicine to cover the affected area. You can cover the area with a light gauze dressing, but do not use tight or air-tight dressings. Finish the full course prescribed by your doctor or health care professional, even if you think your condition is better. Do not stop taking except on the advice of your doctor or health care professional. Talk to your  pediatrician regarding the use of this medicine in children. Special care may be needed. Overdosage: If you think you've taken too much of this medicine contact a poison control center or emergency room at once. Overdosage: If you think you have taken too much of this medicine contact a poison control center or emergency room at once. NOTE: This medicine is only for you. Do not share this medicine with others. What if I miss a dose? If you miss a dose, apply it as soon as you can. If it is almost time for your next dose, only use that dose. Do not apply extra doses. Contact your doctor or health care professional if you miss more than one dose. What may interact with this medicine? Interactions are not expected. Do not use any other skin products without telling your doctor or health care professional. This list may not describe all possible interactions. Give your health care provider a list of all the medicines, herbs, non-prescription drugs, or dietary supplements you use. Also tell them if you smoke, drink alcohol, or use illegal drugs. Some items may interact with your medicine. What should I watch for while using this medicine? Visit your doctor or health care professional for checks on your progress. You will need to use this medicine for 2 to 6 weeks. This may be longer depending on the condition being treated. You may not see full healing for another 1 to 2 months after you stop using the medicine. Treated areas of skin can look unsightly during and for several weeks after treatment with this medicine. This medicine can make you more sensitive to the sun. Keep out of the sun. If you cannot avoid being in   the sun, wear protective clothing and use sunscreen. Do not use sun lamps or tanning beds/booths. Where should I keep my What side effects may I notice from receiving this medicine? Side effects that you should report to your doctor or health care professional as soon as possible: -allergic  reactions like skin rash, itching or hives, swelling of the face, lips, or tongue -black or bloody stools, blood in the urine or vomit -blurred vision -chest pain -difficulty breathing or wheezing -redness, blistering, peeling or loosening of the skin, including inside the mouth -severe redness and swelling of normal skin -slurred speech or weakness on one side of the body -trouble passing urine or change in the amount of urine -unexplained weight gain or swelling -unusually weak or tired -yellowing of eyes or skin Side effects that usually do not require medical attention (Report these to your doctor or health care professional if they continue or are bothersome.): -increased sensitivity of the skin to sun and ultraviolet light -pain and burning of the affected area -scaling or swelling of the affected area -skin rash, itching of the affected area -tenderness This list may not describe all possible side effects. Call your doctor for medical advice about side effects. You may report side effects to FDA at 1-800-FDA-1088. Where should I keep my medicine? Keep out of the reach of children. Store at room temperature between 20 and 25 degrees C (68 and 77 degrees F). Throw away any unused medicine after the expiration date. NOTE: This sheet is a summary. It may not cover all possible information. If you have questions about this medicine, talk to your doctor, pharmacist, or health care provider.  2015, Elsevier/Gold Standard. (2013-11-27 11:09:58)  

## 2014-07-20 ENCOUNTER — Encounter: Payer: Self-pay | Admitting: Oncology

## 2014-07-22 ENCOUNTER — Other Ambulatory Visit: Payer: Self-pay | Admitting: Oncology

## 2014-07-23 ENCOUNTER — Ambulatory Visit: Payer: 59 | Admitting: Nutrition

## 2014-07-23 ENCOUNTER — Telehealth: Payer: Self-pay | Admitting: Nurse Practitioner

## 2014-07-23 ENCOUNTER — Ambulatory Visit (HOSPITAL_BASED_OUTPATIENT_CLINIC_OR_DEPARTMENT_OTHER): Payer: 59

## 2014-07-23 ENCOUNTER — Ambulatory Visit (HOSPITAL_BASED_OUTPATIENT_CLINIC_OR_DEPARTMENT_OTHER): Payer: 59 | Admitting: Nurse Practitioner

## 2014-07-23 ENCOUNTER — Other Ambulatory Visit (HOSPITAL_BASED_OUTPATIENT_CLINIC_OR_DEPARTMENT_OTHER): Payer: 59

## 2014-07-23 VITALS — BP 128/82 | HR 74 | Temp 98.2°F | Resp 18 | Ht 74.0 in | Wt 154.0 lb

## 2014-07-23 DIAGNOSIS — Z5111 Encounter for antineoplastic chemotherapy: Secondary | ICD-10-CM

## 2014-07-23 DIAGNOSIS — D696 Thrombocytopenia, unspecified: Secondary | ICD-10-CM

## 2014-07-23 DIAGNOSIS — C20 Malignant neoplasm of rectum: Secondary | ICD-10-CM

## 2014-07-23 LAB — CBC WITH DIFFERENTIAL/PLATELET
BASO%: 0.2 % (ref 0.0–2.0)
Basophils Absolute: 0 10*3/uL (ref 0.0–0.1)
EOS ABS: 0.2 10*3/uL (ref 0.0–0.5)
EOS%: 3.4 % (ref 0.0–7.0)
HCT: 37.1 % — ABNORMAL LOW (ref 38.4–49.9)
HGB: 12.6 g/dL — ABNORMAL LOW (ref 13.0–17.1)
LYMPH%: 17.3 % (ref 14.0–49.0)
MCH: 30.1 pg (ref 27.2–33.4)
MCHC: 34 g/dL (ref 32.0–36.0)
MCV: 88.5 fL (ref 79.3–98.0)
MONO#: 0.8 10*3/uL (ref 0.1–0.9)
MONO%: 13.5 % (ref 0.0–14.0)
NEUT%: 65.6 % (ref 39.0–75.0)
NEUTROS ABS: 3.6 10*3/uL (ref 1.5–6.5)
PLATELETS: 104 10*3/uL — AB (ref 140–400)
RBC: 4.19 10*6/uL — AB (ref 4.20–5.82)
RDW: 14.9 % — ABNORMAL HIGH (ref 11.0–14.6)
WBC: 5.6 10*3/uL (ref 4.0–10.3)
lymph#: 1 10*3/uL (ref 0.9–3.3)

## 2014-07-23 LAB — COMPREHENSIVE METABOLIC PANEL (CC13)
ALT: 56 U/L — ABNORMAL HIGH (ref 0–55)
ANION GAP: 11 meq/L (ref 3–11)
AST: 26 U/L (ref 5–34)
Albumin: 3.6 g/dL (ref 3.5–5.0)
Alkaline Phosphatase: 159 U/L — ABNORMAL HIGH (ref 40–150)
BILIRUBIN TOTAL: 0.38 mg/dL (ref 0.20–1.20)
BUN: 11.8 mg/dL (ref 7.0–26.0)
CALCIUM: 8.8 mg/dL (ref 8.4–10.4)
CO2: 24 meq/L (ref 22–29)
Chloride: 106 mEq/L (ref 98–109)
Creatinine: 0.9 mg/dL (ref 0.7–1.3)
GLUCOSE: 97 mg/dL (ref 70–140)
Potassium: 3.9 mEq/L (ref 3.5–5.1)
SODIUM: 141 meq/L (ref 136–145)
Total Protein: 6.7 g/dL (ref 6.4–8.3)

## 2014-07-23 MED ORDER — DEXAMETHASONE SODIUM PHOSPHATE 10 MG/ML IJ SOLN
INTRAMUSCULAR | Status: AC
  Filled 2014-07-23: qty 1

## 2014-07-23 MED ORDER — ONDANSETRON 8 MG/NS 50 ML IVPB
INTRAVENOUS | Status: AC
  Filled 2014-07-23: qty 8

## 2014-07-23 MED ORDER — SODIUM CHLORIDE 0.9 % IV SOLN
2400.0000 mg/m2 | INTRAVENOUS | Status: DC
  Administered 2014-07-23: 4550 mg via INTRAVENOUS
  Filled 2014-07-23: qty 91

## 2014-07-23 MED ORDER — DEXAMETHASONE SODIUM PHOSPHATE 10 MG/ML IJ SOLN
10.0000 mg | Freq: Once | INTRAMUSCULAR | Status: AC
  Administered 2014-07-23: 10 mg via INTRAVENOUS

## 2014-07-23 MED ORDER — FLUOROURACIL CHEMO INJECTION 2.5 GM/50ML
400.0000 mg/m2 | Freq: Once | INTRAVENOUS | Status: AC
  Administered 2014-07-23: 750 mg via INTRAVENOUS
  Filled 2014-07-23: qty 15

## 2014-07-23 MED ORDER — DEXTROSE 5 % IV SOLN
Freq: Once | INTRAVENOUS | Status: AC
  Administered 2014-07-23: 12:00:00 via INTRAVENOUS

## 2014-07-23 MED ORDER — OXALIPLATIN CHEMO INJECTION 100 MG/20ML
85.0000 mg/m2 | Freq: Once | INTRAVENOUS | Status: AC
  Administered 2014-07-23: 160 mg via INTRAVENOUS
  Filled 2014-07-23: qty 32

## 2014-07-23 MED ORDER — OXYCODONE HCL 5 MG PO TABS: 5.0000 mg | ORAL_TABLET | ORAL | Status: DC | PRN

## 2014-07-23 MED ORDER — ONDANSETRON 8 MG/50ML IVPB (CHCC)
8.0000 mg | Freq: Once | INTRAVENOUS | Status: AC
  Administered 2014-07-23: 8 mg via INTRAVENOUS

## 2014-07-23 MED ORDER — LEUCOVORIN CALCIUM INJECTION 350 MG
400.0000 mg/m2 | Freq: Once | INTRAVENOUS | Status: AC
  Administered 2014-07-23: 760 mg via INTRAVENOUS
  Filled 2014-07-23: qty 38

## 2014-07-23 NOTE — Telephone Encounter (Signed)
Pt confirmed labs/ov per 12/14 POF, gave pt AVS..... KJ, sent msg to add chemo °

## 2014-07-23 NOTE — Progress Notes (Signed)
  [facility] OFFICE PROGRESS NOTE   Diagnosis:  Rectal cancer  INTERVAL HISTORY:   Seth Villanueva returns as scheduled. He completed cycle 3 FOLFOX 07/09/2014. No significant nausea/vomiting. He notes his appetite is diminished for a few days after each treatment. No mouth sores. No diarrhea. Pain at the rectum is better. He has been able to increase his activity. He estimates taking 1-4 oxycodone tablets a day. He has occasional numbness/tingling in the fingertips and toes. Symptoms resolve with movement. He notes jaw pain for one to 2 days after each treatment. He also notes that he is more tired for several days. He has been napping during the day and thus is having difficulty sleeping at night time.  Objective:  Vital signs in last 24 hours:  Blood pressure 128/82, pulse 74, temperature 98.2 F (36.8 C), temperature source Oral, resp. rate 18, height $RemoveBe'6\' 2"'jWHdRBpEt$  (1.88 m), weight 154 lb (69.854 kg), SpO2 98 %.    HEENT: no ulcers. Persistent white coating over tongue. Resp: lungs clear bilaterally. Cardio: regular rate and rhythm. GI: abdomen soft and nontender. No hepatomegaly. Right lower quadrant ileostomy. Thick stool in the collection bag. Vascular: no leg edema. Neuro: vibratory sense intact over the fingertips per tuning fork exam.  Port-A-Cath without erythema.    Lab Results:  Lab Results  Component Value Date   WBC 5.6 07/23/2014   HGB 12.6* 07/23/2014   HCT 37.1* 07/23/2014   MCV 88.5 07/23/2014   PLT 104* 07/23/2014   NEUTROABS 3.6 07/23/2014    Imaging:  No results found.  Medications: I have reviewed the patient's current medications.  Assessment/Plan: 1. 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 Completion of radiation and concurrent Xeloda 03/28/2014.  Low anterior resection, diverting loop ileostomy, and coloanal anastomosis 05/18/2014,ypT2,ypN2, no loss of mismatch repair protein expression  Cycle 1 adjuvant FOLFOX 06/11/2014  Cycle 2 adjuvant FOLFOX 06/25/2014  Cycle 3 adjuvant FOLFOX 07/09/2014 2. History of rectal pain secondary to #1.   Disposition: [name] appears stable. He has completed 3 cycles of FOLFOX. Plan to proceed with cycle 4 today as scheduled.   He has mild thrombocytopenia on labs today. He will contact the office with any unusual bruising/bleeding.  He will return for a followup visit and cycle 5 FOLFOX in 2 weeks.    Ned Card ANP/GNP-BC   07/23/2014  10:20 AM

## 2014-07-23 NOTE — Patient Instructions (Signed)
[facility] Discharge Instructions for Patients Receiving Chemotherapy  Today you received the following chemotherapy agents oxaliplatin, leucovorin, 5 fu To help prevent nausea and vomiting after your treatment, we encourage you to take your nausea medication as directed/prescribed   If you develop nausea and vomiting that is not controlled by your nausea medication, call the clinic.   BELOW ARE SYMPTOMS THAT SHOULD BE REPORTED IMMEDIATELY:  *FEVER GREATER THAN 100.5 F  *CHILLS WITH OR WITHOUT FEVER  NAUSEA AND VOMITING THAT IS NOT CONTROLLED WITH YOUR NAUSEA MEDICATION  *UNUSUAL SHORTNESS OF BREATH  *UNUSUAL BRUISING OR BLEEDING  TENDERNESS IN MOUTH AND THROAT WITH OR WITHOUT PRESENCE OF ULCERS  *URINARY PROBLEMS  *BOWEL PROBLEMS  UNUSUAL RASH Items with * indicate a potential emergency and should be followed up as soon as possible.  Feel free to call the clinic you have any questions or concerns. The clinic phone number is [phone].

## 2014-07-23 NOTE — Progress Notes (Signed)
Nutrition followup completed with patient during chemotherapy. Weight is stable and was documented as 154 pounds December 14. Patient reports he continues to consume ENU oral nutrition supplements once daily. Appetite and oral intake remain appropriate.  Nutrition diagnosis: Unintended weight loss improved.  Intervention: Patient educated to continue calories and protein. Patient recommended to continue using oral nutrition supplements.  Teach back method used.  Monitoring, evaluation, goals: Patient will continue to work to increase oral intake to minimize weight loss.  Next visit:Monday, December 28, during chemotherapy.  **Disclaimer: This note was dictated with voice recognition software. Similar sounding words can inadvertently be transcribed and this note may contain transcription errors which may not have been corrected upon publication of note.**

## 2014-07-25 ENCOUNTER — Ambulatory Visit (HOSPITAL_BASED_OUTPATIENT_CLINIC_OR_DEPARTMENT_OTHER): Payer: 59

## 2014-07-25 DIAGNOSIS — C20 Malignant neoplasm of rectum: Secondary | ICD-10-CM

## 2014-07-25 MED ORDER — SODIUM CHLORIDE 0.9 % IJ SOLN
10.0000 mL | INTRAMUSCULAR | Status: DC | PRN
  Administered 2014-07-25: 10 mL
  Filled 2014-07-25: qty 10

## 2014-07-25 MED ORDER — HEPARIN SOD (PORK) LOCK FLUSH 100 UNIT/ML IV SOLN
500.0000 [IU] | Freq: Once | INTRAVENOUS | Status: AC | PRN
  Administered 2014-07-25: 500 [IU]
  Filled 2014-07-25: qty 5

## 2014-08-02 ENCOUNTER — Telehealth: Payer: Self-pay | Admitting: *Deleted

## 2014-08-02 NOTE — Telephone Encounter (Signed)
Faxed order for ostomy supplies to [facility] [phone].

## 2014-08-04 ENCOUNTER — Other Ambulatory Visit: Payer: Self-pay | Admitting: Oncology

## 2014-08-06 ENCOUNTER — Other Ambulatory Visit (HOSPITAL_BASED_OUTPATIENT_CLINIC_OR_DEPARTMENT_OTHER): Payer: 59

## 2014-08-06 ENCOUNTER — Other Ambulatory Visit: Payer: Self-pay | Admitting: *Deleted

## 2014-08-06 ENCOUNTER — Ambulatory Visit (HOSPITAL_BASED_OUTPATIENT_CLINIC_OR_DEPARTMENT_OTHER): Payer: 59 | Admitting: Oncology

## 2014-08-06 ENCOUNTER — Ambulatory Visit (HOSPITAL_BASED_OUTPATIENT_CLINIC_OR_DEPARTMENT_OTHER): Payer: 59

## 2014-08-06 ENCOUNTER — Telehealth: Payer: Self-pay | Admitting: Oncology

## 2014-08-06 ENCOUNTER — Ambulatory Visit: Payer: 59 | Admitting: Nutrition

## 2014-08-06 ENCOUNTER — Telehealth: Payer: Self-pay | Admitting: *Deleted

## 2014-08-06 VITALS — BP 148/79 | HR 92 | Temp 97.0°F | Resp 18 | Ht 74.0 in | Wt 152.1 lb

## 2014-08-06 DIAGNOSIS — C20 Malignant neoplasm of rectum: Secondary | ICD-10-CM

## 2014-08-06 DIAGNOSIS — Z5111 Encounter for antineoplastic chemotherapy: Secondary | ICD-10-CM

## 2014-08-06 LAB — COMPREHENSIVE METABOLIC PANEL (CC13)
ALBUMIN: 3.8 g/dL (ref 3.5–5.0)
ALK PHOS: 136 U/L (ref 40–150)
ALT: 47 U/L (ref 0–55)
AST: 32 U/L (ref 5–34)
Anion Gap: 8 mEq/L (ref 3–11)
BILIRUBIN TOTAL: 0.59 mg/dL (ref 0.20–1.20)
BUN: 10.2 mg/dL (ref 7.0–26.0)
CO2: 28 meq/L (ref 22–29)
Calcium: 9.4 mg/dL (ref 8.4–10.4)
Chloride: 108 mEq/L (ref 98–109)
Creatinine: 0.9 mg/dL (ref 0.7–1.3)
EGFR: >90 mL/min/{1.73_m2} (ref >90)
Glucose: 95 mg/dl (ref 70–140)
Potassium: 3.6 mEq/L (ref 3.5–5.1)
Sodium: 144 mEq/L (ref 136–145)
TOTAL PROTEIN: 7 g/dL (ref 6.4–8.3)

## 2014-08-06 LAB — CBC WITH DIFFERENTIAL/PLATELET
BASO%: 1.1 % (ref 0.0–2.0)
Basophils Absolute: 0 10*3/uL (ref 0.0–0.1)
EOS%: 3.4 % (ref 0.0–7.0)
Eosinophils Absolute: 0.1 10*3/uL (ref 0.0–0.5)
HCT: 38.9 % (ref 38.4–49.9)
HGB: 12.9 g/dL — ABNORMAL LOW (ref 13.0–17.1)
LYMPH%: 13.1 % — AB (ref 14.0–49.0)
MCH: 29.9 pg (ref 27.2–33.4)
MCHC: 33.2 g/dL (ref 32.0–36.0)
MCV: 90.2 fL (ref 79.3–98.0)
MONO#: 0.7 10*3/uL (ref 0.1–0.9)
MONO%: 15.7 % — ABNORMAL HIGH (ref 0.0–14.0)
NEUT#: 2.8 10*3/uL (ref 1.5–6.5)
NEUT%: 66.7 % (ref 39.0–75.0)
PLATELETS: 124 10*3/uL — AB (ref 140–400)
RBC: 4.31 10*6/uL (ref 4.20–5.82)
RDW: 17.7 % — ABNORMAL HIGH (ref 11.0–14.6)
WBC: 4.3 10*3/uL (ref 4.0–10.3)
lymph#: 0.6 10*3/uL — ABNORMAL LOW (ref 0.9–3.3)

## 2014-08-06 MED ORDER — FLUOROURACIL CHEMO INJECTION 2.5 GM/50ML
400.0000 mg/m2 | Freq: Once | INTRAVENOUS | Status: AC
Start: 2014-08-06 — End: 2014-08-06
  Administered 2014-08-06: 750 mg via INTRAVENOUS
  Filled 2014-08-06: qty 15

## 2014-08-06 MED ORDER — SODIUM CHLORIDE 0.9 % IV SOLN
150.0000 mg | Freq: Once | INTRAVENOUS | Status: AC
  Administered 2014-08-06: 150 mg via INTRAVENOUS
  Filled 2014-08-06: qty 5

## 2014-08-06 MED ORDER — PALONOSETRON HCL INJECTION 0.25 MG/5ML
0.2500 mg | Freq: Once | INTRAVENOUS | Status: AC
  Administered 2014-08-06: 0.25 mg via INTRAVENOUS

## 2014-08-06 MED ORDER — PALONOSETRON HCL INJECTION 0.25 MG/5ML
INTRAVENOUS | Status: AC
  Filled 2014-08-06: qty 5

## 2014-08-06 MED ORDER — OXYCODONE HCL 5 MG PO TABS: 5.0000 mg | ORAL_TABLET | ORAL | Status: DC | PRN

## 2014-08-06 MED ORDER — OXALIPLATIN CHEMO INJECTION 100 MG/20ML
85.0000 mg/m2 | Freq: Once | INTRAVENOUS | Status: AC
  Administered 2014-08-06: 160 mg via INTRAVENOUS
  Filled 2014-08-06: qty 32

## 2014-08-06 MED ORDER — LORAZEPAM 0.5 MG PO TABS: 0.5000 mg | ORAL_TABLET | Freq: Four times a day (QID) | ORAL | Status: DC | PRN

## 2014-08-06 MED ORDER — SODIUM CHLORIDE 0.9 % IV SOLN
2400.0000 mg/m2 | INTRAVENOUS | Status: DC
  Administered 2014-08-06: 4550 mg via INTRAVENOUS
  Filled 2014-08-06: qty 91

## 2014-08-06 MED ORDER — DEXTROSE 5 % IV SOLN
Freq: Once | INTRAVENOUS | Status: AC
  Administered 2014-08-06: 12:00:00 via INTRAVENOUS

## 2014-08-06 MED ORDER — DEXAMETHASONE SODIUM PHOSPHATE 10 MG/ML IJ SOLN
INTRAMUSCULAR | Status: AC
  Filled 2014-08-06: qty 1

## 2014-08-06 MED ORDER — LEUCOVORIN CALCIUM INJECTION 350 MG
400.0000 mg/m2 | Freq: Once | INTRAMUSCULAR | Status: AC
  Administered 2014-08-06: 760 mg via INTRAVENOUS
  Filled 2014-08-06: qty 38

## 2014-08-06 MED ORDER — ONDANSETRON HCL 8 MG PO TABS: 8.0000 mg | ORAL_TABLET | Freq: Three times a day (TID) | ORAL | Status: DC | PRN

## 2014-08-06 MED ORDER — DEXAMETHASONE SODIUM PHOSPHATE 10 MG/ML IJ SOLN
10.0000 mg | Freq: Once | INTRAMUSCULAR | Status: AC
  Administered 2014-08-06: 10 mg via INTRAVENOUS

## 2014-08-06 NOTE — Telephone Encounter (Signed)
, °

## 2014-08-06 NOTE — Progress Notes (Signed)
  [facility] OFFICE PROGRESS NOTE   Diagnosis: Rectal cancer  INTERVAL HISTORY:   Mr. Clearman returns as scheduled. He completed another cycle of FOLFOX 07/23/2014. He reports nausea for several days following chemotherapy. No emesis. Cold sensitivity and tingling over the week following chemotherapy. The rectal pain has improved. He has decreased the use of oxycodone and ibuprofen. He plans to return to work 08/13/2013. Minimal diarrhea from the ileostomy.  Objective:  Vital signs in last 24 hours:  Blood pressure 148/79, pulse 92, temperature 97 F (36.1 C), temperature source Oral, resp. rate 18, height $RemoveBe'6\' 2"'jVbpqnRAy$  (1.88 m), weight 152 lb 1.6 oz (68.992 kg), SpO2 99 %.    HEENT: No thrush or ulcers Resp: Lungs clear bilaterally Cardio: Regular rate and rhythm GI: No hepatomegaly, nontender, liquid stool in the ileostomy bag Vascular: No leg edema Neuro: The vibratory sense is intact at the fingertips bilaterally   Portacath/PICC-without erythema  Lab Results:  Lab Results  Component Value Date   WBC 4.3 08/06/2014   HGB 12.9* 08/06/2014   HCT 38.9 08/06/2014   MCV 90.2 08/06/2014   PLT 124* 08/06/2014   NEUTROABS 2.8 08/06/2014      Lab Results  Component Value Date   CEA 2.4 02/01/2014    Medications: I have reviewed the patient's current medications.  Assessment/Plan: 1. 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 Completion of radiation and concurrent Xeloda 03/28/2014.  Low anterior resection, diverting loop ileostomy, and coloanal  anastomosis 05/18/2014,ypT2,ypN2, no loss of mismatch repair protein expression  Cycle 1 adjuvant FOLFOX 06/11/2014  Cycle 2 adjuvant FOLFOX 06/25/2014  Cycle 3 adjuvant FOLFOX 07/09/2014  Cycle 4 adjuvant FOLFOX 07/23/2014 2. History of rectal pain secondary to #1. Improved.    Disposition:  Mr. Boomer appears stable. We will adjust the anti-emetic regimen for delayed nausea. He will complete cycle 5 of adjuvant FOLFOX today. He will return for an office visit and chemotherapy in 2 weeks.  Betsy Coder, MD  08/06/2014  10:57 AM

## 2014-08-06 NOTE — Telephone Encounter (Signed)
Per staff message and POF I have scheduled appts. Advised scheduler of appts. JMW  

## 2014-08-06 NOTE — Progress Notes (Signed)
Nutrition follow-up completed with patient during chemotherapy. Weight decreased slightly and documented as 152.1 pounds December 28 down from 154 pounds December 14. Patient reports increased nausea after last treatment which lasted longer than in the past. Patient has additional nausea medications added today. Patient continues to drink ENU oral nutrition supplements. Overall patient feels well.  Nutrition diagnosis: Unintended weight loss continues.  Intervention: Discussed strategies for increasing calories and protein. Recommended a variety of recipes for juicing. Educated patient to continue ENU for added calories and protein. Questions were answered.  Teach back method used.  Monitoring, evaluation, goals: Patient continues to increase oral intake however weight down approximately 2 pounds.  Next visit: Monday, January 11, during chemotherapy.  **Disclaimer: This note was dictated with voice recognition software. Similar sounding words can inadvertently be transcribed and this note may contain transcription errors which may not have been corrected upon publication of note.**

## 2014-08-06 NOTE — Patient Instructions (Signed)
[facility] Discharge Instructions for Patients Receiving Chemotherapy  Today you received the following chemotherapy agents: Oxaliplatin, Leucovorin and Fluorouracil.   To help prevent nausea and vomiting after your treatment, we encourage you to take your nausea medication as directed.    If you develop nausea and vomiting that is not controlled by your nausea medication, call the clinic.   BELOW ARE SYMPTOMS THAT SHOULD BE REPORTED IMMEDIATELY:  *FEVER GREATER THAN 100.5 F  *CHILLS WITH OR WITHOUT FEVER  NAUSEA AND VOMITING THAT IS NOT CONTROLLED WITH YOUR NAUSEA MEDICATION  *UNUSUAL SHORTNESS OF BREATH  *UNUSUAL BRUISING OR BLEEDING  TENDERNESS IN MOUTH AND THROAT WITH OR WITHOUT PRESENCE OF ULCERS  *URINARY PROBLEMS  *BOWEL PROBLEMS  UNUSUAL RASH Items with * indicate a potential emergency and should be followed up as soon as possible.  Feel free to call the clinic you have any questions or concerns. The clinic phone number is [phone].    

## 2014-08-08 ENCOUNTER — Ambulatory Visit (HOSPITAL_BASED_OUTPATIENT_CLINIC_OR_DEPARTMENT_OTHER): Payer: 59

## 2014-08-08 DIAGNOSIS — C20 Malignant neoplasm of rectum: Secondary | ICD-10-CM

## 2014-08-08 DIAGNOSIS — Z452 Encounter for adjustment and management of vascular access device: Secondary | ICD-10-CM

## 2014-08-08 MED ORDER — HEPARIN SOD (PORK) LOCK FLUSH 100 UNIT/ML IV SOLN
500.0000 [IU] | Freq: Once | INTRAVENOUS | Status: AC | PRN
  Administered 2014-08-08: 500 [IU]
  Filled 2014-08-08: qty 5

## 2014-08-08 MED ORDER — SODIUM CHLORIDE 0.9 % IJ SOLN
10.0000 mL | INTRAMUSCULAR | Status: DC | PRN
  Administered 2014-08-08: 10 mL
  Filled 2014-08-08: qty 10

## 2014-08-14 ENCOUNTER — Telehealth: Payer: Self-pay | Admitting: *Deleted

## 2014-08-14 NOTE — Telephone Encounter (Signed)
Requesting a letter "To Whom it May Concern" that it is OK for him to return to work as tolerated. May need to be out for treatment appointments or side effects of treatment. Fax [phone]  [id number] Dalton Request forwarded to MD desk.

## 2014-08-19 ENCOUNTER — Other Ambulatory Visit: Payer: Self-pay | Admitting: Oncology

## 2014-08-20 ENCOUNTER — Other Ambulatory Visit (HOSPITAL_BASED_OUTPATIENT_CLINIC_OR_DEPARTMENT_OTHER): Payer: 59

## 2014-08-20 ENCOUNTER — Ambulatory Visit: Payer: 59 | Admitting: Nutrition

## 2014-08-20 ENCOUNTER — Telehealth: Payer: Self-pay | Admitting: Nurse Practitioner

## 2014-08-20 ENCOUNTER — Ambulatory Visit (HOSPITAL_BASED_OUTPATIENT_CLINIC_OR_DEPARTMENT_OTHER): Payer: 59

## 2014-08-20 ENCOUNTER — Ambulatory Visit (HOSPITAL_BASED_OUTPATIENT_CLINIC_OR_DEPARTMENT_OTHER): Payer: 59 | Admitting: Nurse Practitioner

## 2014-08-20 VITALS — BP 134/72 | HR 82 | Temp 97.6°F | Resp 18 | Ht 74.0 in | Wt 153.3 lb

## 2014-08-20 DIAGNOSIS — C20 Malignant neoplasm of rectum: Secondary | ICD-10-CM

## 2014-08-20 DIAGNOSIS — Z5111 Encounter for antineoplastic chemotherapy: Secondary | ICD-10-CM

## 2014-08-20 DIAGNOSIS — I809 Phlebitis and thrombophlebitis of unspecified site: Secondary | ICD-10-CM

## 2014-08-20 DIAGNOSIS — G893 Neoplasm related pain (acute) (chronic): Secondary | ICD-10-CM

## 2014-08-20 DIAGNOSIS — D6959 Other secondary thrombocytopenia: Secondary | ICD-10-CM

## 2014-08-20 LAB — COMPREHENSIVE METABOLIC PANEL (CC13)
ALBUMIN: 3.9 g/dL (ref 3.5–5.0)
ALK PHOS: 153 U/L — AB (ref 40–150)
ALT: 35 U/L (ref 0–55)
ANION GAP: 9 meq/L (ref 3–11)
AST: 31 U/L (ref 5–34)
BUN: 10.1 mg/dL (ref 7.0–26.0)
CO2: 27 meq/L (ref 22–29)
CREATININE: 0.9 mg/dL (ref 0.7–1.3)
Calcium: 9.5 mg/dL (ref 8.4–10.4)
Chloride: 106 mEq/L (ref 98–109)
EGFR: >90 mL/min/{1.73_m2} (ref >90)
Glucose: 141 mg/dl — ABNORMAL HIGH (ref 70–140)
Potassium: 3.6 mEq/L (ref 3.5–5.1)
SODIUM: 142 meq/L (ref 136–145)
Total Bilirubin: 0.56 mg/dL (ref 0.20–1.20)
Total Protein: 7.1 g/dL (ref 6.4–8.3)

## 2014-08-20 LAB — CBC WITH DIFFERENTIAL/PLATELET
BASO%: 1.2 % (ref 0.0–2.0)
BASOS ABS: 0 10*3/uL (ref 0.0–0.1)
EOS ABS: 0.1 10*3/uL (ref 0.0–0.5)
EOS%: 3.8 % (ref 0.0–7.0)
HEMATOCRIT: 39.4 % (ref 38.4–49.9)
HGB: 13.3 g/dL (ref 13.0–17.1)
LYMPH%: 25.1 % (ref 14.0–49.0)
MCH: 30.4 pg (ref 27.2–33.4)
MCHC: 33.8 g/dL (ref 32.0–36.0)
MCV: 90.2 fL (ref 79.3–98.0)
MONO#: 0.5 10*3/uL (ref 0.1–0.9)
MONO%: 13.5 % (ref 0.0–14.0)
NEUT%: 56.4 % (ref 39.0–75.0)
NEUTROS ABS: 1.9 10*3/uL (ref 1.5–6.5)
PLATELETS: 89 10*3/uL — AB (ref 140–400)
RBC: 4.37 10*6/uL (ref 4.20–5.82)
RDW: 17.1 % — ABNORMAL HIGH (ref 11.0–14.6)
WBC: 3.4 10*3/uL — ABNORMAL LOW (ref 4.0–10.3)
lymph#: 0.9 10*3/uL (ref 0.9–3.3)

## 2014-08-20 MED ORDER — DEXTROSE 5 % IV SOLN
Freq: Once | INTRAVENOUS | Status: AC
  Administered 2014-08-20: 12:00:00 via INTRAVENOUS

## 2014-08-20 MED ORDER — PALONOSETRON HCL INJECTION 0.25 MG/5ML
INTRAVENOUS | Status: AC
  Filled 2014-08-20: qty 5

## 2014-08-20 MED ORDER — SODIUM CHLORIDE 0.9 % IJ SOLN
10.0000 mL | INTRAMUSCULAR | Status: DC | PRN
Start: 2014-08-20 — End: 2014-08-20
  Filled 2014-08-20: qty 10

## 2014-08-20 MED ORDER — SODIUM CHLORIDE 0.9 % IV SOLN
150.0000 mg | Freq: Once | INTRAVENOUS | Status: AC
  Administered 2014-08-20: 150 mg via INTRAVENOUS
  Filled 2014-08-20: qty 5

## 2014-08-20 MED ORDER — FLUOROURACIL CHEMO INJECTION 2.5 GM/50ML
400.0000 mg/m2 | Freq: Once | INTRAVENOUS | Status: AC
  Administered 2014-08-20: 750 mg via INTRAVENOUS
  Filled 2014-08-20: qty 15

## 2014-08-20 MED ORDER — HEPARIN SOD (PORK) LOCK FLUSH 100 UNIT/ML IV SOLN
500.0000 [IU] | Freq: Once | INTRAVENOUS | Status: DC | PRN
  Filled 2014-08-20: qty 5

## 2014-08-20 MED ORDER — PALONOSETRON HCL INJECTION 0.25 MG/5ML
0.2500 mg | Freq: Once | INTRAVENOUS | Status: AC
  Administered 2014-08-20: 0.25 mg via INTRAVENOUS

## 2014-08-20 MED ORDER — DEXAMETHASONE SODIUM PHOSPHATE 10 MG/ML IJ SOLN
INTRAMUSCULAR | Status: AC
  Filled 2014-08-20: qty 1

## 2014-08-20 MED ORDER — OXALIPLATIN CHEMO INJECTION 100 MG/20ML
85.0000 mg/m2 | Freq: Once | INTRAVENOUS | Status: AC
  Administered 2014-08-20: 160 mg via INTRAVENOUS
  Filled 2014-08-20: qty 32

## 2014-08-20 MED ORDER — DEXTROSE 5 % IV SOLN
400.0000 mg/m2 | Freq: Once | INTRAVENOUS | Status: AC
  Administered 2014-08-20: 760 mg via INTRAVENOUS
  Filled 2014-08-20: qty 38

## 2014-08-20 MED ORDER — SODIUM CHLORIDE 0.9 % IV SOLN
2400.0000 mg/m2 | INTRAVENOUS | Status: DC
  Administered 2014-08-20: 4550 mg via INTRAVENOUS
  Filled 2014-08-20: qty 91

## 2014-08-20 MED ORDER — DEXAMETHASONE SODIUM PHOSPHATE 10 MG/ML IJ SOLN
10.0000 mg | Freq: Once | INTRAMUSCULAR | Status: AC
  Administered 2014-08-20: 10 mg via INTRAVENOUS

## 2014-08-20 NOTE — Telephone Encounter (Signed)
Pt confirmed labs/ov per 01/11 POF, gave pt AVS..... KJ , sent msg to add chemo °

## 2014-08-20 NOTE — Patient Instructions (Signed)
[facility] Discharge Instructions for Patients Receiving Chemotherapy  Today you received the following chemotherapy agents Oxalliplatin, 5FU, Leucovorin,   To help prevent nausea and vomiting after your treatment, we encourage you to take your nausea medication as directed.   If you develop nausea and vomiting that is not controlled by your nausea medication, call the clinic.   BELOW ARE SYMPTOMS THAT SHOULD BE REPORTED IMMEDIATELY:  *FEVER GREATER THAN 100.5 F  *CHILLS WITH OR WITHOUT FEVER  NAUSEA AND VOMITING THAT IS NOT CONTROLLED WITH YOUR NAUSEA MEDICATION  *UNUSUAL SHORTNESS OF BREATH  *UNUSUAL BRUISING OR BLEEDING  TENDERNESS IN MOUTH AND THROAT WITH OR WITHOUT PRESENCE OF ULCERS  *URINARY PROBLEMS  *BOWEL PROBLEMS  UNUSUAL RASH Items with * indicate a potential emergency and should be followed up as soon as possible.  Feel free to call the clinic you have any questions or concerns. The clinic phone number is [phone].

## 2014-08-20 NOTE — Progress Notes (Addendum)
[facility] OFFICE PROGRESS NOTE   Diagnosis:  Rectal cancer  INTERVAL HISTORY:   Seth Villanueva returns as scheduled. He completed cycle 5 FOLFOX on 08/06/2014. He had mild intermittent nausea for about 4 days. No vomiting. He was able to eat during this time. No mouth sores. No significant diarrhea. He notes less pain at the rectum. He occasionally notes a small amount of rectal bleeding. This tends to occur when he increases his activity level. Cold sensitivity lasted 4-5 days. No persistent neuropathy symptoms. About 5 days ago he noted a red streak and pain at the right upper arm. The redness has resolved and the pain is better. He denies any arm swelling.  Objective:  Vital signs in last 24 hours:  Blood pressure 134/72, pulse 82, temperature 97.6 F (36.4 C), temperature source Oral, resp. rate 18, height _0  (1.88 m), weight 153 lb 4.8 oz (69.536 kg), SpO2 100 %.    HEENT: No thrush or ulcers. Resp: Lungs clear bilaterally. Cardio: Regular rate and rhythm. GI: Abdomen is soft. No hepatomegaly. Right lower quadrant ileostomy with liquid stool in the collection bag. Vascular: No leg edema. No arm edema. No erythema at the right upper arm. At the mid right upper inner arm there is an approximate 1/2-1 cm palpable superficial cord. Port-A-Cath without erythema. Lab Results:  Lab Results  Component Value Date   WBC 3.4* 08/20/2014   HGB 13.3 08/20/2014   HCT 39.4 08/20/2014   MCV 90.2 08/20/2014   PLT 89* 08/20/2014   NEUTROABS 1.9 08/20/2014    Imaging:  No results found.  Medications: I have reviewed the patient's current medications.  Assessment/Plan: 1. 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 Completion of radiation and concurrent Xeloda 03/28/2014.  Low anterior resection, diverting loop ileostomy, and coloanal anastomosis 05/18/2014,ypT2,ypN2, no loss of mismatch repair protein expression  Cycle 1 adjuvant FOLFOX 06/11/2014  Cycle 2 adjuvant FOLFOX 06/25/2014  Cycle 3 adjuvant FOLFOX 07/09/2014  Cycle 4 adjuvant FOLFOX 07/23/2014  Cycle 5 adjuvant FOLFOX 08/06/2014 2. History of rectal pain secondary to #1. Improved. 3. Phlebitis right upper inner arm 08/20/2014. 4. Thrombocytopenia related to chemotherapy 08/20/2014.   Disposition: Seth Villanueva appears stable. He has completed 5 cycles of adjuvant FOLFOX. Plan to proceed with cycle 6 today as scheduled.  He is thrombocytopenic on labs today. He understands to contact the office with any bleeding.  He appears to have an area of resolving phlebitis at the right upper arm. He will apply warm compresses. He will contact the office with erythema or edema of the arm.  Seth Villanueva or return for a follow-up visit and cycle 7 FOLFOX in 2 weeks. He will contact the office in the interim as on the bladder with any other problems.  Patient seen with Dr. Benay Spice. 25 minutes were spent face-to-face at today's visit with the majority of that time involved in counseling/coordination of care.    Ned Card ANP/GNP-BC   08/20/2014  10:13 AM This was a shared visit with Ned Card. Seth Villanueva was interviewed and examined. He will completed cycle 6 of FOLFOX today.   Julieanne Manson, MD

## 2014-08-20 NOTE — Progress Notes (Signed)
Nutrition follow up completed with patient during chemotherapy for rectal cancer. Weight increased slightly to 153.3 pounds on Jan 11. Nausea continues for 4 days after chemotherapy.  No vomiting. Patient tries to eat well between treatments. He continues Enu oral nutrition supplements.  Nutrition Diagnosis:  Unintended weight loss improved.  Intervention: Recommended patient continue oral nutrition supplements and juices as tolerated. Teach back method used.  Monitoring, Evaluation, Goals: Patient will continue adequate oral intake to minimize weight loss.  Next Visit:Will schedule as needed throughout treatment.  **Disclaimer: This note was dictated with voice recognition software. Similar sounding words can inadvertently be transcribed and this note may contain transcription errors which may not have been corrected upon publication of note.**

## 2014-08-22 ENCOUNTER — Ambulatory Visit (HOSPITAL_BASED_OUTPATIENT_CLINIC_OR_DEPARTMENT_OTHER): Payer: 59

## 2014-08-22 ENCOUNTER — Ambulatory Visit: Payer: 59 | Admitting: Nurse Practitioner

## 2014-08-22 DIAGNOSIS — C20 Malignant neoplasm of rectum: Secondary | ICD-10-CM

## 2014-08-22 MED ORDER — SODIUM CHLORIDE 0.9 % IJ SOLN
10.0000 mL | INTRAMUSCULAR | Status: DC | PRN
  Administered 2014-08-22: 10 mL
  Filled 2014-08-22: qty 10

## 2014-08-22 MED ORDER — HEPARIN SOD (PORK) LOCK FLUSH 100 UNIT/ML IV SOLN
500.0000 [IU] | Freq: Once | INTRAVENOUS | Status: AC | PRN
  Administered 2014-08-22: 500 [IU]
  Filled 2014-08-22: qty 5

## 2014-08-22 NOTE — Patient Instructions (Signed)
Fluorouracil, 5FU; Diclofenac topical cream What is this medicine? FLUOROURACIL; DICLOFENAC (flure oh YOOR a sil; dye KLOE fen ak) is a combination of a topical chemotherapy agent and non-steroidal anti-inflammatory drug (NSAID). It is used on the skin to treat skin cancer and skin conditions that could become cancer. This medicine may be used for other purposes; ask your health care provider or pharmacist if you have questions. COMMON BRAND NAME(S): FLUORAC What should I tell my health care provider before I take this medicine? They need to know if you have any of these conditions: -bleeding problems -cigarette smoker -DPD enzyme deficiency -heart disease -high blood pressure -if you frequently drink alcohol containing drinks -kidney disease -liver disease -open or infected skin -stomach problems -swelling or open sores at the treatment site -recent or planned coronary artery bypass graft (CABG) surgery -an unusual or allergic reaction to fluorouracil, diclofenac, aspirin, other NSAIDs, other medicines, foods, dyes, or preservatives -pregnant or trying to get pregnant -breast-feeding How should I use this medicine? This medicine is only for use on the skin. Follow the directions on the prescription label. Wash hands before and after use. Wash affected area and gently pat dry. To apply this medicine use a cotton-tipped applicator, or use gloves if applying with fingertips. If applied with unprotected fingertips, it is very important to wash your hands well after you apply this medicine. Avoid applying to the eyes, nose, or mouth. Apply enough medicine to cover the affected area. You can cover the area with a light gauze dressing, but do not use tight or air-tight dressings. Finish the full course prescribed by your doctor or health care professional, even if you think your condition is better. Do not stop taking except on the advice of your doctor or health care professional. Talk to your  pediatrician regarding the use of this medicine in children. Special care may be needed. Overdosage: If you think you've taken too much of this medicine contact a poison control center or emergency room at once. Overdosage: If you think you have taken too much of this medicine contact a poison control center or emergency room at once. NOTE: This medicine is only for you. Do not share this medicine with others. What if I miss a dose? If you miss a dose, apply it as soon as you can. If it is almost time for your next dose, only use that dose. Do not apply extra doses. Contact your doctor or health care professional if you miss more than one dose. What may interact with this medicine? Interactions are not expected. Do not use any other skin products without telling your doctor or health care professional. This list may not describe all possible interactions. Give your health care provider a list of all the medicines, herbs, non-prescription drugs, or dietary supplements you use. Also tell them if you smoke, drink alcohol, or use illegal drugs. Some items may interact with your medicine. What should I watch for while using this medicine? Visit your doctor or health care professional for checks on your progress. You will need to use this medicine for 2 to 6 weeks. This may be longer depending on the condition being treated. You may not see full healing for another 1 to 2 months after you stop using the medicine. Treated areas of skin can look unsightly during and for several weeks after treatment with this medicine. This medicine can make you more sensitive to the sun. Keep out of the sun. If you cannot avoid being in   the sun, wear protective clothing and use sunscreen. Do not use sun lamps or tanning beds/booths. Where should I keep my What side effects may I notice from receiving this medicine? Side effects that you should report to your doctor or health care professional as soon as possible: -allergic  reactions like skin rash, itching or hives, swelling of the face, lips, or tongue -black or bloody stools, blood in the urine or vomit -blurred vision -chest pain -difficulty breathing or wheezing -redness, blistering, peeling or loosening of the skin, including inside the mouth -severe redness and swelling of normal skin -slurred speech or weakness on one side of the body -trouble passing urine or change in the amount of urine -unexplained weight gain or swelling -unusually weak or tired -yellowing of eyes or skin Side effects that usually do not require medical attention (Report these to your doctor or health care professional if they continue or are bothersome.): -increased sensitivity of the skin to sun and ultraviolet light -pain and burning of the affected area -scaling or swelling of the affected area -skin rash, itching of the affected area -tenderness This list may not describe all possible side effects. Call your doctor for medical advice about side effects. You may report side effects to FDA at 1-800-FDA-1088. Where should I keep my medicine? Keep out of the reach of children. Store at room temperature between 20 and 25 degrees C (68 and 77 degrees F). Throw away any unused medicine after the expiration date. NOTE: This sheet is a summary. It may not cover all possible information. If you have questions about this medicine, talk to your doctor, pharmacist, or health care provider.  2015, Elsevier/Gold Standard. (2013-11-27 11:09:58)  

## 2014-08-27 ENCOUNTER — Telehealth: Payer: Self-pay | Admitting: *Deleted

## 2014-08-27 NOTE — Telephone Encounter (Signed)
VM from [facility] asking if paperwork for him was received by our office? It was faxed to [phone] and is time sensitive and needed by 08/29/14. VM from patient asking for status of his paperwork. No documentation in EPIC regarding this. Forwarded message to Maudie Mercury in HIM department as well as managed care department. Will follow up on 08/28/14.

## 2014-08-28 ENCOUNTER — Encounter: Payer: Self-pay | Admitting: Oncology

## 2014-08-28 ENCOUNTER — Encounter (HOSPITAL_COMMUNITY): Payer: Self-pay | Admitting: *Deleted

## 2014-08-28 ENCOUNTER — Emergency Department (HOSPITAL_COMMUNITY)
Admission: EM | Admit: 2014-08-28 | Discharge: 2014-08-28 | Disposition: A | Discharge status: Home / Self Care | Payer: 59 | Attending: Emergency Medicine | Admitting: Emergency Medicine

## 2014-08-28 ENCOUNTER — Other Ambulatory Visit: Payer: Self-pay

## 2014-08-28 ENCOUNTER — Telehealth: Payer: Self-pay

## 2014-08-28 ENCOUNTER — Emergency Department (HOSPITAL_COMMUNITY): Payer: 59

## 2014-08-28 ENCOUNTER — Telehealth: Payer: Self-pay | Admitting: *Deleted

## 2014-08-28 DIAGNOSIS — Z8719 Personal history of other diseases of the digestive system: Secondary | ICD-10-CM | POA: Insufficient documentation

## 2014-08-28 DIAGNOSIS — R509 Fever, unspecified: Secondary | ICD-10-CM | POA: Diagnosis present

## 2014-08-28 DIAGNOSIS — Z87891 Personal history of nicotine dependence: Secondary | ICD-10-CM | POA: Insufficient documentation

## 2014-08-28 DIAGNOSIS — J019 Acute sinusitis, unspecified: Secondary | ICD-10-CM | POA: Insufficient documentation

## 2014-08-28 DIAGNOSIS — Z79899 Other long term (current) drug therapy: Secondary | ICD-10-CM | POA: Insufficient documentation

## 2014-08-28 DIAGNOSIS — F419 Anxiety disorder, unspecified: Secondary | ICD-10-CM | POA: Insufficient documentation

## 2014-08-28 DIAGNOSIS — D696 Thrombocytopenia, unspecified: Secondary | ICD-10-CM | POA: Insufficient documentation

## 2014-08-28 DIAGNOSIS — C20 Malignant neoplasm of rectum: Secondary | ICD-10-CM | POA: Insufficient documentation

## 2014-08-28 LAB — COMPREHENSIVE METABOLIC PANEL
ALT: 58 U/L — AB (ref 0–53)
AST: 42 U/L — ABNORMAL HIGH (ref 0–37)
Albumin: 4.4 g/dL (ref 3.5–5.2)
Alkaline Phosphatase: 158 U/L — ABNORMAL HIGH (ref 39–117)
Anion gap: 6 (ref 5–15)
BUN: 9 mg/dL (ref 6–23)
CO2: 24 mmol/L (ref 19–32)
CREATININE: 0.92 mg/dL (ref 0.50–1.35)
Calcium: 9.4 mg/dL (ref 8.4–10.5)
Chloride: 106 mEq/L (ref 96–112)
GFR calc Af Amer: >90 mL/min (ref >90)
GFR calc non Af Amer: >90 mL/min (ref >90)
Glucose, Bld: 116 mg/dL — ABNORMAL HIGH (ref 70–99)
Potassium: 3.8 mmol/L (ref 3.5–5.1)
Sodium: 136 mmol/L (ref 135–145)
TOTAL PROTEIN: 7.8 g/dL (ref 6.0–8.3)
Total Bilirubin: 0.8 mg/dL (ref 0.3–1.2)

## 2014-08-28 LAB — CBC WITH DIFFERENTIAL/PLATELET
Basophils Absolute: 0 10*3/uL (ref 0.0–0.1)
Basophils Relative: 1 % (ref 0–1)
Eosinophils Absolute: 0.1 10*3/uL (ref 0.0–0.7)
Eosinophils Relative: 2 % (ref 0–5)
HEMATOCRIT: 39.5 % (ref 39.0–52.0)
Hemoglobin: 13.5 g/dL (ref 13.0–17.0)
LYMPHS PCT: 11 % — AB (ref 12–46)
Lymphs Abs: 0.5 10*3/uL — ABNORMAL LOW (ref 0.7–4.0)
MCH: 30.9 pg (ref 26.0–34.0)
MCHC: 34.2 g/dL (ref 30.0–36.0)
MCV: 90.4 fL (ref 78.0–100.0)
MONOS PCT: 19 % — AB (ref 3–12)
Monocytes Absolute: 0.8 10*3/uL (ref 0.1–1.0)
NEUTROS ABS: 3 10*3/uL (ref 1.7–7.7)
NEUTROS PCT: 67 % (ref 43–77)
Platelets: 96 10*3/uL — ABNORMAL LOW (ref 150–400)
RBC: 4.37 MIL/uL (ref 4.22–5.81)
RDW: 16.5 % — ABNORMAL HIGH (ref 11.5–15.5)
WBC: 4.4 10*3/uL (ref 4.0–10.5)

## 2014-08-28 LAB — URINALYSIS, ROUTINE W REFLEX MICROSCOPIC
Bilirubin Urine: NEGATIVE
GLUCOSE, UA: NEGATIVE mg/dL
Hgb urine dipstick: NEGATIVE
Ketones, ur: NEGATIVE mg/dL
LEUKOCYTES UA: NEGATIVE
NITRITE: NEGATIVE
PROTEIN: NEGATIVE mg/dL
Specific Gravity, Urine: 1.005 (ref 1.005–1.030)
UROBILINOGEN UA: 0.2 mg/dL (ref 0.0–1.0)
pH: 7 (ref 5.0–8.0)

## 2014-08-28 LAB — I-STAT CG4 LACTIC ACID, ED: Lactic Acid, Venous: 1.76 mmol/L (ref 0.5–2.2)

## 2014-08-28 MED ORDER — AMOXICILLIN-POT CLAVULANATE 875-125 MG PO TABS
1.0000 | ORAL_TABLET | Freq: Once | ORAL | Status: AC
  Administered 2014-08-28: 1 via ORAL
  Filled 2014-08-28: qty 1

## 2014-08-28 MED ORDER — AMOXICILLIN-POT CLAVULANATE 875-125 MG PO TABS: 1.0000 | ORAL_TABLET | Freq: Two times a day (BID) | ORAL | Status: DC

## 2014-08-28 MED ORDER — SODIUM CHLORIDE 0.9 % IV BOLUS (SEPSIS)
1000.0000 mL | Freq: Once | INTRAVENOUS | Status: AC
  Administered 2014-08-28: 1000 mL via INTRAVENOUS

## 2014-08-28 NOTE — Discharge Instructions (Signed)
Sinusitis Sinusitis is redness, soreness, and inflammation of the paranasal sinuses. Paranasal sinuses are air pockets within the bones of your face (beneath the eyes, the middle of the forehead, or above the eyes). In healthy paranasal sinuses, mucus is able to drain out, and air is able to circulate through them by way of your nose. However, when your paranasal sinuses are inflamed, mucus and air can become trapped. This can allow bacteria and other germs to grow and cause infection. Sinusitis can develop quickly and last only a short time (acute) or continue over a long period (chronic). Sinusitis that lasts for more than 12 weeks is considered chronic.  CAUSES  Causes of sinusitis include:  Allergies.  Structural abnormalities, such as displacement of the cartilage that separates your nostrils (deviated septum), which can decrease the air flow through your nose and sinuses and affect sinus drainage.  Functional abnormalities, such as when the small hairs (cilia) that line your sinuses and help remove mucus do not work properly or are not present. SIGNS AND SYMPTOMS  Symptoms of acute and chronic sinusitis are the same. The primary symptoms are pain and pressure around the affected sinuses. Other symptoms include:  Upper toothache.  Earache.  Headache.  Bad breath.  Decreased sense of smell and taste.  A cough, which worsens when you are lying flat.  Fatigue.  Fever.  Thick drainage from your nose, which often is green and may contain pus (purulent).  Swelling and warmth over the affected sinuses. DIAGNOSIS  Your health care provider will perform a physical exam. During the exam, your health care provider may:  Look in your nose for signs of abnormal growths in your nostrils (nasal polyps).  Tap over the affected sinus to check for signs of infection.  View the inside of your sinuses (endoscopy) using an imaging device that has a light attached (endoscope). If your health  care provider suspects that you have chronic sinusitis, one or more of the following tests may be recommended:  Allergy tests.  Nasal culture. A sample of mucus is taken from your nose, sent to a lab, and screened for bacteria.  Nasal cytology. A sample of mucus is taken from your nose and examined by your health care provider to determine if your sinusitis is related to an allergy. TREATMENT  Most cases of acute sinusitis are related to a viral infection and will resolve on their own within 10 days. Sometimes medicines are prescribed to help relieve symptoms (pain medicine, decongestants, nasal steroid sprays, or saline sprays).  However, for sinusitis related to a bacterial infection, your health care provider will prescribe antibiotic medicines. These are medicines that will help kill the bacteria causing the infection.  Rarely, sinusitis is caused by a fungal infection. In theses cases, your health care provider will prescribe antifungal medicine. For some cases of chronic sinusitis, surgery is needed. Generally, these are cases in which sinusitis recurs more than 3 times per year, despite other treatments. HOME CARE INSTRUCTIONS   Drink plenty of water. Water helps thin the mucus so your sinuses can drain more easily.  Use a humidifier.  Inhale steam 3 to 4 times a day (for example, sit in the bathroom with the shower running).  Apply a warm, moist washcloth to your face 3 to 4 times a day, or as directed by your health care provider.  Use saline nasal sprays to help moisten and clean your sinuses.  Take medicines only as directed by your health care provider.    If you were prescribed either an antibiotic or antifungal medicine, finish it all even if you start to feel better. SEEK IMMEDIATE MEDICAL CARE IF:  You have increasing pain or severe headaches.  You have nausea, vomiting, or drowsiness.  You have swelling around your face.  You have vision problems.  You have a stiff  neck.  You have difficulty breathing. MAKE SURE YOU:   Understand these instructions.  Will watch your condition.  Will get help right away if you are not doing well or get worse. Document Released: 07/27/2005 Document Revised: 12/11/2013 Document Reviewed: 08/11/2011 [facility] Patient Information 2015 [place], [place]. This information is not intended to replace advice given to you by your health care provider. Make sure you discuss any questions you have with your health care provider.  Thrombocytopenia Thrombocytopenia is a condition in which there is an abnormally small number of platelets in your blood. Platelets are also called thrombocytes. Platelets are needed for blood clotting. CAUSES Thrombocytopenia is caused by:   Decreased production of platelets. This can be caused by:  Aplastic anemia in which your bone marrow quits making blood cells.  Cancer in the bone marrow.  Use of certain medicines, including chemotherapy.  Infection in the bone marrow.  Heavy alcohol consumption.  Increased destruction of platelets. This can be caused by:  Certain immune diseases.  Use of certain drugs.  Certain blood clotting disorders.  Certain inherited disorders.  Certain bleeding disorders.  Pregnancy.  Having an enlarged spleen (hypersplenism). In hypersplenism, the spleen gathers up platelets from circulation. This means the platelets are not available to help with blood clotting. The spleen can enlarge due to cirrhosis or other conditions. SYMPTOMS  The symptoms of thrombocytopenia are side effects of poor blood clotting. Some of these are:  Abnormal bleeding.  Nosebleeds.  Heavy menstrual periods.  Blood in the urine or stools.  Purpura. This is a purplish discoloration in the skin produced by small bleeding vessels near the surface of the skin.  Bruising.  A rash that may be petechial. This looks like pinpoint, purplish-red spots on the skin and mucous  membranes. It is caused by bleeding from small blood vessels (capillaries). DIAGNOSIS  Your caregiver will make this diagnosis based on your exam and blood tests. Sometimes, a bone marrow study is done to look for the original cells (megakaryocytes) that make platelets. TREATMENT  Treatment depends on the cause of the condition.  Medicines may be given to help protect your platelets from being destroyed.  In some cases, a replacement (transfusion) of platelets may be required to stop or prevent bleeding.  Sometimes, the spleen must be surgically removed. HOME CARE INSTRUCTIONS   Check the skin and linings inside your mouth for bruising or bleeding as directed by your caregiver.  Check your sputum, urine, and stool for blood as directed by your caregiver.  Do not return to any activities that could cause bumps or bruises until your caregiver says it is okay.  Take extra care not to cut yourself when shaving or when using scissors, needles, knives, and other tools.  Take extra care not to burn yourself when ironing or cooking.  Ask your caregiver if it is okay for you to drink alcohol.  Only take over-the-counter or prescription medicines as directed by your caregiver.  Notify all your caregivers, including dentists and eye doctors, about your condition. SEEK IMMEDIATE MEDICAL CARE IF:   You develop active bleeding from anywhere in your body.  You develop unexplained bruising  or bleeding.  You have blood in your sputum, urine, or stool. MAKE SURE YOU:  Understand these instructions.  Will watch your condition.  Will get help right away if you are not doing well or get worse. Document Released: 07/27/2005 Document Revised: 10/19/2011 Document Reviewed: 05/29/2011 [facility] Patient Information 2015 [place], [place]. This information is not intended to replace advice given to you by your health care provider. Make sure you discuss any questions you have with your health care  provider.

## 2014-08-28 NOTE — Progress Notes (Signed)
Put the [employer] disability form on nurse's desk

## 2014-08-28 NOTE — Telephone Encounter (Signed)
Noted that Seth Villanueva in managed care contacted the [employer] and requested they re-fax his disability forms since medical records department has moved. Unable to locate them at present. If patient calls office again, please direct him to managed care and not the collaborative nurse.

## 2014-08-28 NOTE — Telephone Encounter (Signed)
MESSAGE RECEIVED ON VM IN TRIAGE LEFT AT 149PM TODAY.  " I need to find out about my short term disability that is due Wednesday "  Return call number left as [phone].  Pt did not leave DOB.  Noted entry per 08/27/2014 by Cristy Friedlander RN.  THIS NOTE WILL BE SENT TO HIM FOR FOLLOW UP AND SUSAN C RN .

## 2014-08-28 NOTE — ED Provider Notes (Signed)
CSN: [id number]     Arrival date & time 08/28/14  1856 History   First MD Initiated Contact with Patient 08/28/14 1905     Chief Complaint  Patient presents with  . chemo card   . Fever     (Consider location/radiation/quality/duration/timing/severity/associated sxs/prior Treatment) Patient is a 34 y.o. male presenting with fever. The history is provided by the patient.  Fever  patient presents with fever. Has had nasal congestion and pressure. Is on chemotherapy for rectal cancer. Last chemotherapy was 8 days ago. No cough. He states it does feel like it is moved from his nose down. Has had some blood out of his left nose. States his been a small amount. He has a history of thrombocytopenia both before the chemotherapy and has not been rechecked since it. No abdominal pain. No nausea vomiting. Fevers up to 100.5 at home. No diarrhea. No abdominal pain. He has an ostomy.  Past Medical History  Diagnosis Date  . Rectal polyp 2015  . Allergy   . Cancer 01/30/14    rectum-Invasive adenocarcinoma  . Anxiety     new dx  . S/P radiation therapy 02/19/14-03/28/14    rectal  50.4Gy  . S/P chemotherapy, time since 4-12 weeks    Past Surgical History  Procedure Laterality Date  . No past surgeries    . Wisdom tooth extraction    . Eus N/A 01/30/2014    Procedure: LOWER ENDOSCOPIC ULTRASOUND (EUS);  Surgeon: Arta Silence, MD;  Location: [facility] ENDOSCOPY;  Service: Endoscopy;  Laterality: N/A;  . Rectal biopsy  01/30/14    Invasive Adenocarcinoma  . Colonoscopy w/ biopsies  01/26/14    fungating nonobstructing large mass distal rectum  . Laparoscopic low anterior resection  05/18/2014    with splenic flexure mobilization and diverting loop ileostomy  . Portacath placement Left 05/31/2014    Procedure: INSERTION PORT-A-CATH LET SUBCLAVIAN;  Surgeon: [name], MD;  Location: [facility] ORS;  Service: General;  Laterality: Left;   Family History  Problem Relation Age of Onset  . Breast cancer  Maternal Aunt     pat mat great aunt; currently 41  . Leukemia Maternal Aunt     pat mat great aunt; deceased 36s  . Other Paternal Aunt     colon polyps (type/#?)   History  Substance Use Topics  . Smoking status: Former Smoker    Types: Cigarettes    Quit date: 02/02/2012  . Smokeless tobacco: Former Systems developer    Types: Snuff    Quit date: 02/02/2012  . Alcohol Use: No    Review of Systems  Constitutional: Positive for fever.      Allergies  Zithromax and Compazine  Home Medications   Prior to Admission medications   Medication Sig Start Date End Date Taking? Authorizing Provider  clotrimazole (MYCELEX) 10 MG troche Take 1 tablet (10 mg total) by mouth 4 (four) times daily. 07/09/14  Yes Owens Shark, NP  ibuprofen (ADVIL,MOTRIN) 200 MG tablet Take 200 mg by mouth every 6 (six) hours as needed (pain.).    Yes Historical Provider, MD  lidocaine-prilocaine (EMLA) cream Apply 1 application topically as needed. Apply to [facility] site 1-2 hours prior to stick and cover with plastic wrap to numb site 05/28/14  Yes Ladell Pier, MD  Loperamide HCl (IMODIUM A-D PO) Take 2 mg by mouth once as needed (loose stool).    Yes Historical Provider, MD  ondansetron (ZOFRAN) 8 MG tablet Take 1 tablet (8 mg total)  by mouth every 8 (eight) hours as needed for nausea or vomiting. 08/06/14  Yes Ladell Pier, MD  oxyCODONE (OXY IR/ROXICODONE) 5 MG immediate release tablet Take 1-2 tablets (5-10 mg total) by mouth every 4 (four) hours as needed for severe pain or breakthrough pain (Try to alternate ibuprofen 400 mg to attempt to wean off narcotic). 08/06/14  Yes Ladell Pier, MD  polycarbophil (FIBERCON) 625 MG tablet Take 1 tablet (625 mg total) by mouth 2 (two) times daily. 89/38/10  Yes [name], MD  PRESCRIPTION MEDICATION palonosetron (ALOXI) injection 0.25 mg 0.25 mg Once 08/20/2014   Yes Historical Provider, MD  PRESCRIPTION MEDICATION oxaliplatin (ELOXATIN) 160 mg in dextrose 5 % 500 mL  chemo infusion 85 mg/m2  1.9 m2 (Treatment Plan Actual) Once 08/20/2014   Yes Historical Provider, MD  pseudoephedrine (SUDAFED) 30 MG tablet Take 30 mg by mouth every 4 (four) hours as needed for congestion.   Yes Historical Provider, MD  Pseudoephedrine-Ibuprofen (ADVIL COLD/SINUS PO) Take 1 tablet by mouth daily as needed (cold/sinus symptoms.).   Yes Historical Provider, MD  acetaminophen (TYLENOL) 325 MG tablet Take 650 mg by mouth once as needed for mild pain.     Historical Provider, MD  Alum & Mag Hydroxide-Simeth (MAGIC MOUTHWASH) SOLN Take 15 mLs by mouth 4 (four) times daily. Patient not taking: Reported on 08/20/2014 05/27/14   Excell Seltzer, MD  amoxicillin-clavulanate (AUGMENTIN) 875-125 MG per tablet Take 1 tablet by mouth every 12 (twelve) hours. 08/28/14   Jasper Riling. Keylen Eckenrode, MD  fluconazole (DIFLUCAN) 100 MG tablet Take 1 tablet (100 mg total) by mouth daily. Patient not taking: Reported on 08/20/2014 06/15/14   Ladell Pier, MD  LORazepam (ATIVAN) 0.5 MG tablet Take 1 tablet (0.5 mg total) by mouth every 6 (six) hours as needed. Patient not taking: Reported on 08/20/2014 08/06/14   Ladell Pier, MD  [place]     Historical Provider, MD  simethicone (MYLICON) 175 MG chewable tablet Chew 125 mg by mouth every 6 (six) hours as needed for flatulence (gel pill OTC).    Historical Provider, MD   BP 132/89 mmHg  Pulse 95  Temp(Src) 98.8 F (37.1 C) (Oral)  Resp 16  SpO2 99% Physical Exam  Constitutional: He is oriented to person, place, and time. He appears well-developed and well-nourished.  HENT:  Head: Normocephalic and atraumatic.  Dried blood medially on left nare.  Eyes: EOM are normal. Pupils are equal, round, and reactive to light.  Neck: Normal range of motion. Neck supple.  Cardiovascular: Normal rate, regular rhythm and normal heart sounds.   No murmur heard. Pulmonary/Chest: Effort normal and breath sounds normal.  Abdominal: Soft. Bowel  sounds are normal. He exhibits no distension and no mass. There is no tenderness. There is no rebound and no guarding.  Ostomy in right lower abdomen. There is dressing over a area inferior and lateral to it that appears to well-healing.  Musculoskeletal: Normal range of motion. He exhibits no edema.  Neurological: He is alert and oriented to person, place, and time. No cranial nerve deficit.  Skin: Skin is warm and dry.  Psychiatric: He has a normal mood and affect.  Nursing note and vitals reviewed.   ED Course  Procedures (including critical care time) Labs Review Labs Reviewed  CBC WITH DIFFERENTIAL - Abnormal; Notable for the following:    RDW 16.5 (*)    Platelets 96 (*)    Lymphocytes Relative 11 (*)    Monocytes  Relative 19 (*)    Lymphs Abs 0.5 (*)    All other components within normal limits  COMPREHENSIVE METABOLIC PANEL - Abnormal; Notable for the following:    Glucose, Bld 116 (*)    AST 42 (*)    ALT 58 (*)    Alkaline Phosphatase 158 (*)    All other components within normal limits  CULTURE, BLOOD (ROUTINE X 2)  CULTURE, BLOOD (ROUTINE X 2)  URINE CULTURE  URINALYSIS, ROUTINE W REFLEX MICROSCOPIC  I-STAT CG4 LACTIC ACID, ED    Imaging Review Dg Chest 2 View  08/28/2014   CLINICAL DATA:  Fevers and personal history of rectal carcinoma  EXAM: CHEST  2 VIEW  COMPARISON:  05/31/2014  FINDINGS: A left chest wall port is again seen. The cardiac shadow is stable. The lungs are well aerated bilaterally. A focal infiltrate or sizable effusion is seen. No acute bony abnormality is noted.  IMPRESSION: No active cardiopulmonary disease.   Electronically Signed   By: Inez Catalina M.D.   On: 08/28/2014 19:42     EKG Interpretation None      MDM   Final diagnoses:  Fever  Acute sinusitis, recurrence not specified, unspecified location  Thrombocytopenia    Patient with fever. Not neutropenic although he is on chemotherapy. Likely sinusitis as cause of the fever. Will  treat with antibiotics due to the chemotherapy. Also has thrombocytopenia which is stable. Will discharge home. blood cultures have been sent.    Jasper Riling. Alvino Chapel, MD 08/29/14 0005

## 2014-08-28 NOTE — Progress Notes (Signed)
Called Gay Filler @ [employer], [phone] ext [phone], and asked that she fax the forms to me because HIM has moved and we don't know if they ever received the papers.

## 2014-08-28 NOTE — Progress Notes (Signed)
Spoke with Charlena Cross regarding pt paperwork they were inquiring about. Ebony called pt to have them fax a copy to her fax number.

## 2014-08-28 NOTE — ED Notes (Signed)
Pt reports currently being treated for rectal cancer, last chemo 1/11, pt has colostomy which was placed in Oct 2015. Pt has rectal bleeding, but was checked by doctor previously. Reports he started having a nose bleed this morning and head pain.

## 2014-08-28 NOTE — Telephone Encounter (Signed)
error 

## 2014-08-30 LAB — URINE CULTURE
COLONY COUNT: NO GROWTH
Culture: NO GROWTH

## 2014-09-02 ENCOUNTER — Other Ambulatory Visit: Payer: Self-pay | Admitting: Oncology

## 2014-09-03 ENCOUNTER — Ambulatory Visit (HOSPITAL_BASED_OUTPATIENT_CLINIC_OR_DEPARTMENT_OTHER): Payer: 59 | Admitting: Nurse Practitioner

## 2014-09-03 ENCOUNTER — Ambulatory Visit (HOSPITAL_BASED_OUTPATIENT_CLINIC_OR_DEPARTMENT_OTHER): Payer: 59

## 2014-09-03 ENCOUNTER — Other Ambulatory Visit (HOSPITAL_BASED_OUTPATIENT_CLINIC_OR_DEPARTMENT_OTHER): Payer: 59

## 2014-09-03 ENCOUNTER — Ambulatory Visit: Payer: 59 | Admitting: Nutrition

## 2014-09-03 VITALS — BP 135/87 | HR 86 | Temp 98.4°F | Resp 18 | Ht 74.0 in | Wt 155.0 lb

## 2014-09-03 DIAGNOSIS — C20 Malignant neoplasm of rectum: Secondary | ICD-10-CM

## 2014-09-03 DIAGNOSIS — Z5111 Encounter for antineoplastic chemotherapy: Secondary | ICD-10-CM

## 2014-09-03 DIAGNOSIS — D6959 Other secondary thrombocytopenia: Secondary | ICD-10-CM

## 2014-09-03 LAB — COMPREHENSIVE METABOLIC PANEL (CC13)
ALK PHOS: 170 U/L — AB (ref 40–150)
ALT: 44 U/L (ref 0–55)
AST: 33 U/L (ref 5–34)
Albumin: 3.8 g/dL (ref 3.5–5.0)
Anion Gap: 10 mEq/L (ref 3–11)
BILIRUBIN TOTAL: 0.37 mg/dL (ref 0.20–1.20)
BUN: 8.4 mg/dL (ref 7.0–26.0)
CO2: 25 mEq/L (ref 22–29)
CREATININE: 0.8 mg/dL (ref 0.7–1.3)
Calcium: 9.2 mg/dL (ref 8.4–10.4)
Chloride: 109 mEq/L (ref 98–109)
GLUCOSE: 100 mg/dL (ref 70–140)
Potassium: 3.8 mEq/L (ref 3.5–5.1)
Sodium: 143 mEq/L (ref 136–145)
TOTAL PROTEIN: 7.1 g/dL (ref 6.4–8.3)

## 2014-09-03 LAB — CEA: CEA: 1.6 ng/mL (ref 0.0–5.0)

## 2014-09-03 LAB — CBC WITH DIFFERENTIAL/PLATELET
BASO%: 1.4 % (ref 0.0–2.0)
Basophils Absolute: 0.1 10*3/uL (ref 0.0–0.1)
EOS%: 4.2 % (ref 0.0–7.0)
Eosinophils Absolute: 0.2 10*3/uL (ref 0.0–0.5)
HEMATOCRIT: 40.8 % (ref 38.4–49.9)
HGB: 13.4 g/dL (ref 13.0–17.1)
LYMPH%: 19.9 % (ref 14.0–49.0)
MCH: 30 pg (ref 27.2–33.4)
MCHC: 32.8 g/dL (ref 32.0–36.0)
MCV: 91.5 fL (ref 79.3–98.0)
MONO#: 0.8 10*3/uL (ref 0.1–0.9)
MONO%: 17.8 % — ABNORMAL HIGH (ref 0.0–14.0)
NEUT#: 2.5 10*3/uL (ref 1.5–6.5)
NEUT%: 56.7 % (ref 39.0–75.0)
Platelets: 123 10*3/uL — ABNORMAL LOW (ref 140–400)
RBC: 4.46 10*6/uL (ref 4.20–5.82)
RDW: 19.3 % — ABNORMAL HIGH (ref 11.0–14.6)
WBC: 4.4 10*3/uL (ref 4.0–10.3)
lymph#: 0.9 10*3/uL (ref 0.9–3.3)

## 2014-09-03 MED ORDER — DEXAMETHASONE SODIUM PHOSPHATE 10 MG/ML IJ SOLN
INTRAMUSCULAR | Status: AC
  Filled 2014-09-03: qty 1

## 2014-09-03 MED ORDER — PALONOSETRON HCL INJECTION 0.25 MG/5ML
0.2500 mg | Freq: Once | INTRAVENOUS | Status: AC
  Administered 2014-09-03: 0.25 mg via INTRAVENOUS

## 2014-09-03 MED ORDER — SODIUM CHLORIDE 0.9 % IV SOLN
150.0000 mg | Freq: Once | INTRAVENOUS | Status: AC
  Administered 2014-09-03: 150 mg via INTRAVENOUS
  Filled 2014-09-03: qty 5

## 2014-09-03 MED ORDER — LEUCOVORIN CALCIUM INJECTION 350 MG
400.0000 mg/m2 | Freq: Once | INTRAVENOUS | Status: AC
  Administered 2014-09-03: 760 mg via INTRAVENOUS
  Filled 2014-09-03: qty 38

## 2014-09-03 MED ORDER — PALONOSETRON HCL INJECTION 0.25 MG/5ML
INTRAVENOUS | Status: AC
  Filled 2014-09-03: qty 5

## 2014-09-03 MED ORDER — DEXTROSE 5 % IV SOLN
85.0000 mg/m2 | Freq: Once | INTRAVENOUS | Status: AC
  Administered 2014-09-03: 160 mg via INTRAVENOUS
  Filled 2014-09-03: qty 32

## 2014-09-03 MED ORDER — DEXAMETHASONE SODIUM PHOSPHATE 10 MG/ML IJ SOLN
10.0000 mg | Freq: Once | INTRAMUSCULAR | Status: AC
  Administered 2014-09-03: 10 mg via INTRAVENOUS

## 2014-09-03 MED ORDER — SODIUM CHLORIDE 0.9 % IV SOLN
2400.0000 mg/m2 | INTRAVENOUS | Status: DC
  Administered 2014-09-03: 4550 mg via INTRAVENOUS
  Filled 2014-09-03: qty 91

## 2014-09-03 MED ORDER — DEXTROSE 5 % IV SOLN
Freq: Once | INTRAVENOUS | Status: AC
  Administered 2014-09-03: 11:00:00 via INTRAVENOUS

## 2014-09-03 MED ORDER — FLUOROURACIL CHEMO INJECTION 2.5 GM/50ML
400.0000 mg/m2 | Freq: Once | INTRAVENOUS | Status: AC
  Administered 2014-09-03: 750 mg via INTRAVENOUS
  Filled 2014-09-03: qty 15

## 2014-09-03 NOTE — Progress Notes (Signed)
Nutrition follow-up completed with patient during chemotherapy for rectal cancer. Weight improved to 155 pounds.  Weight is relatively stable. Patient continues to have some nausea after chemotherapy. Patient demonstrates adequate strategies for increasing oral intake. Patient continues to consume  ENU oral nutrition supplements.  Nutrition diagnosis: Unintended weight loss resolved.  Recommended patient continue strategies for adequate calories and protein to promote weight maintenance/weight gain. Teach back method used. Patient will contact me for any questions or concerns.

## 2014-09-03 NOTE — Progress Notes (Signed)
  [place] OFFICE PROGRESS NOTE   Diagnosis:  Rectal cancer  INTERVAL HISTORY:   Seth Villanueva returns as scheduled. He completed cycle 6 FOLFOX 08/20/2014. He noted mild nausea and a decrease in his energy level for about 1 week after treatment. No mouth sores. He has occasional diarrhea. He notes tingling in his fingertips and toes for about 4-5 days after each treatment. No persistent neuropathy symptoms. Rectal pain continues to be improved. He is no longer taking pain medication.  He was seen in the emergency department on 08/28/2014 for evaluation of a low-grade fever and sinus symptoms. He was treated with antibiotics. Symptoms have resolved. No further fevers.  Objective:  Vital signs in last 24 hours:  Blood pressure 135/87, pulse 86, temperature 98.4 F (36.9 C), temperature source Oral, resp. rate 18, height $RemoveBe'6\' 2"'EzhgamGkj$  (1.88 m), weight 155 lb (70.308 kg), SpO2 99 %.    HEENT: No thrush or ulcers. Resp: Lungs clear bilaterally. Cardio: Regular rate and rhythm. GI: Abdomen soft and nontender. No hepatomegaly. Right lower quadrant ileostomy with liquid stool in the collection bag. Vascular: No leg edema. The palpable superficial cord at the mid right upper inner arm noted at the time of his last visit has resolved. Neuro: Vibratory sense intact over the fingertips per tuning fork exam.  Skin: Palms and soles dry appearing with mild erythema. Port-A-Cath without erythema.    Lab Results:  Lab Results  Component Value Date   WBC 4.4 09/03/2014   HGB 13.4 09/03/2014   HCT 40.8 09/03/2014   MCV 91.5 09/03/2014   PLT 123* 09/03/2014   NEUTROABS 2.5 09/03/2014    Imaging:  No results found.  Medications: I have reviewed the patient's current medications.  Assessment/Plan: 1. Rectal cancer, clinical stage III, distal rectal mass-approximate 2 cm from the anal verge, status post an endoscopic biopsy 01/30/2014 confirming an invasive adenocarcinoma,  microsatellite stable  CTs of the chest, abdomen, and pelvis with no evidence of metastatic disease, malignant-appearing perirectal lymph nodes on the abdomen/pelvis CT 01/29/2014   EUS 01/30/2014 confirmed a uT3,uN2 lesion   Initiation of radiation and concurrent Xeloda 02/19/2014. Completion of radiation and concurrent Xeloda 03/28/2014.  Low anterior resection, diverting loop ileostomy, and coloanal anastomosis 05/18/2014,ypT2,ypN2, no loss of mismatch repair protein expression  Cycle 1 adjuvant FOLFOX 06/11/2014  Cycle 2 adjuvant FOLFOX 06/25/2014  Cycle 3 adjuvant FOLFOX 07/09/2014  Cycle 4 adjuvant FOLFOX 07/23/2014  Cycle 5 adjuvant FOLFOX 08/06/2014  Cycle 6 adjuvant FOLFOX 08/20/2014 2. History of rectal pain secondary to #1. Improved. 3. Phlebitis right upper inner arm 08/20/2014. Resolved. 4. Thrombocytopenia related to chemotherapy 08/20/2014. Improved.   Disposition: [name] appears stable. He has completed 6 cycles of FOLFOX. Plan to proceed with cycle 7 today as scheduled. He will return for a follow-up visit and cycle 8 in 2 weeks. He will contact the office in the interim with any problems.    Ned Card ANP/GNP-BC   09/03/2014  10:03 AM

## 2014-09-03 NOTE — Patient Instructions (Signed)
[facility] Discharge Instructions for Patients Receiving Chemotherapy  Today you received the following chemotherapy agents oxaliplatin/leucovorin/fluorouracil  To help prevent nausea and vomiting after your treatment, we encourage you to take your nausea medication as directed If you develop nausea and vomiting that is not controlled by your nausea medication, call the clinic.   BELOW ARE SYMPTOMS THAT SHOULD BE REPORTED IMMEDIATELY:  *FEVER GREATER THAN 100.5 F  *CHILLS WITH OR WITHOUT FEVER  NAUSEA AND VOMITING THAT IS NOT CONTROLLED WITH YOUR NAUSEA MEDICATION  *UNUSUAL SHORTNESS OF BREATH  *UNUSUAL BRUISING OR BLEEDING  TENDERNESS IN MOUTH AND THROAT WITH OR WITHOUT PRESENCE OF ULCERS  *URINARY PROBLEMS  *BOWEL PROBLEMS  UNUSUAL RASH Items with * indicate a potential emergency and should be followed up as soon as possible.  Feel free to call the clinic you have any questions or concerns. The clinic phone number is [phone].  

## 2014-09-04 LAB — CULTURE, BLOOD (ROUTINE X 2)
CULTURE: NO GROWTH
CULTURE: NO GROWTH

## 2014-09-05 ENCOUNTER — Encounter: Payer: Self-pay | Admitting: Oncology

## 2014-09-05 ENCOUNTER — Ambulatory Visit (HOSPITAL_BASED_OUTPATIENT_CLINIC_OR_DEPARTMENT_OTHER): Payer: 59

## 2014-09-05 DIAGNOSIS — C20 Malignant neoplasm of rectum: Secondary | ICD-10-CM

## 2014-09-05 DIAGNOSIS — Z452 Encounter for adjustment and management of vascular access device: Secondary | ICD-10-CM

## 2014-09-05 MED ORDER — SODIUM CHLORIDE 0.9 % IJ SOLN
10.0000 mL | INTRAMUSCULAR | Status: DC | PRN
  Administered 2014-09-05: 10 mL
  Filled 2014-09-05: qty 10

## 2014-09-05 MED ORDER — HEPARIN SOD (PORK) LOCK FLUSH 100 UNIT/ML IV SOLN
500.0000 [IU] | Freq: Once | INTRAVENOUS | Status: AC | PRN
  Administered 2014-09-05: 500 [IU]
  Filled 2014-09-05: qty 5

## 2014-09-05 NOTE — Patient Instructions (Signed)
Fluorouracil, 5FU; Diclofenac topical cream What is this medicine? FLUOROURACIL; DICLOFENAC (flure oh YOOR a sil; dye KLOE fen ak) is a combination of a topical chemotherapy agent and non-steroidal anti-inflammatory drug (NSAID). It is used on the skin to treat skin cancer and skin conditions that could become cancer. This medicine may be used for other purposes; ask your health care provider or pharmacist if you have questions. COMMON BRAND NAME(S): FLUORAC What should I tell my health care provider before I take this medicine? They need to know if you have any of these conditions: -bleeding problems -cigarette smoker -DPD enzyme deficiency -heart disease -high blood pressure -if you frequently drink alcohol containing drinks -kidney disease -liver disease -open or infected skin -stomach problems -swelling or open sores at the treatment site -recent or planned coronary artery bypass graft (CABG) surgery -an unusual or allergic reaction to fluorouracil, diclofenac, aspirin, other NSAIDs, other medicines, foods, dyes, or preservatives -pregnant or trying to get pregnant -breast-feeding How should I use this medicine? This medicine is only for use on the skin. Follow the directions on the prescription label. Wash hands before and after use. Wash affected area and gently pat dry. To apply this medicine use a cotton-tipped applicator, or use gloves if applying with fingertips. If applied with unprotected fingertips, it is very important to wash your hands well after you apply this medicine. Avoid applying to the eyes, nose, or mouth. Apply enough medicine to cover the affected area. You can cover the area with a light gauze dressing, but do not use tight or air-tight dressings. Finish the full course prescribed by your doctor or health care professional, even if you think your condition is better. Do not stop taking except on the advice of your doctor or health care professional. Talk to your  pediatrician regarding the use of this medicine in children. Special care may be needed. Overdosage: If you think you've taken too much of this medicine contact a poison control center or emergency room at once. Overdosage: If you think you have taken too much of this medicine contact a poison control center or emergency room at once. NOTE: This medicine is only for you. Do not share this medicine with others. What if I miss a dose? If you miss a dose, apply it as soon as you can. If it is almost time for your next dose, only use that dose. Do not apply extra doses. Contact your doctor or health care professional if you miss more than one dose. What may interact with this medicine? Interactions are not expected. Do not use any other skin products without telling your doctor or health care professional. This list may not describe all possible interactions. Give your health care provider a list of all the medicines, herbs, non-prescription drugs, or dietary supplements you use. Also tell them if you smoke, drink alcohol, or use illegal drugs. Some items may interact with your medicine. What should I watch for while using this medicine? Visit your doctor or health care professional for checks on your progress. You will need to use this medicine for 2 to 6 weeks. This may be longer depending on the condition being treated. You may not see full healing for another 1 to 2 months after you stop using the medicine. Treated areas of skin can look unsightly during and for several weeks after treatment with this medicine. This medicine can make you more sensitive to the sun. Keep out of the sun. If you cannot avoid being in   the sun, wear protective clothing and use sunscreen. Do not use sun lamps or tanning beds/booths. Where should I keep my What side effects may I notice from receiving this medicine? Side effects that you should report to your doctor or health care professional as soon as possible: -allergic  reactions like skin rash, itching or hives, swelling of the face, lips, or tongue -black or bloody stools, blood in the urine or vomit -blurred vision -chest pain -difficulty breathing or wheezing -redness, blistering, peeling or loosening of the skin, including inside the mouth -severe redness and swelling of normal skin -slurred speech or weakness on one side of the body -trouble passing urine or change in the amount of urine -unexplained weight gain or swelling -unusually weak or tired -yellowing of eyes or skin Side effects that usually do not require medical attention (Report these to your doctor or health care professional if they continue or are bothersome.): -increased sensitivity of the skin to sun and ultraviolet light -pain and burning of the affected area -scaling or swelling of the affected area -skin rash, itching of the affected area -tenderness This list may not describe all possible side effects. Call your doctor for medical advice about side effects. You may report side effects to FDA at 1-800-FDA-1088. Where should I keep my medicine? Keep out of the reach of children. Store at room temperature between 20 and 25 degrees C (68 and 77 degrees F). Throw away any unused medicine after the expiration date. NOTE: This sheet is a summary. It may not cover all possible information. If you have questions about this medicine, talk to your doctor, pharmacist, or health care provider.  2015, Elsevier/Gold Standard. (2013-11-27 11:09:58)  

## 2014-09-07 ENCOUNTER — Telehealth: Payer: Self-pay | Admitting: Oncology

## 2014-09-07 ENCOUNTER — Encounter: Payer: Self-pay | Admitting: Oncology

## 2014-09-07 ENCOUNTER — Telehealth: Payer: Self-pay | Admitting: *Deleted

## 2014-09-07 NOTE — Telephone Encounter (Signed)
Pt's mother called to say patient's arm is sore at site where labs were drawn. No redness noted. States this happened the last time he had blood drawn, plans to use [facility] for future labs. Instructed patient to use warm compress at 20 minute intervals to see if it will ease pain. To call us back if worsens

## 2014-09-07 NOTE — Progress Notes (Signed)
Put [employer] disability form on nurse's desk

## 2014-09-07 NOTE — Telephone Encounter (Signed)
[name] called wanted to add a flush with labs, pt doesn't want labs done through arms but only through the port, added flush and [name] confirmed.... Seth Villanueva

## 2014-09-14 ENCOUNTER — Encounter: Payer: Self-pay | Admitting: Oncology

## 2014-09-14 NOTE — Progress Notes (Signed)
Faxed disability form to [employer] @ [phone]

## 2014-09-16 ENCOUNTER — Other Ambulatory Visit: Payer: Self-pay | Admitting: Oncology

## 2014-09-17 ENCOUNTER — Encounter: Payer: Self-pay | Admitting: *Deleted

## 2014-09-17 ENCOUNTER — Telehealth: Payer: Self-pay | Admitting: Oncology

## 2014-09-17 ENCOUNTER — Ambulatory Visit (HOSPITAL_BASED_OUTPATIENT_CLINIC_OR_DEPARTMENT_OTHER): Payer: 59

## 2014-09-17 ENCOUNTER — Telehealth: Payer: Self-pay | Admitting: *Deleted

## 2014-09-17 ENCOUNTER — Ambulatory Visit: Payer: 59

## 2014-09-17 ENCOUNTER — Other Ambulatory Visit (HOSPITAL_BASED_OUTPATIENT_CLINIC_OR_DEPARTMENT_OTHER): Payer: 59

## 2014-09-17 ENCOUNTER — Ambulatory Visit (HOSPITAL_BASED_OUTPATIENT_CLINIC_OR_DEPARTMENT_OTHER): Payer: 59 | Admitting: Oncology

## 2014-09-17 ENCOUNTER — Other Ambulatory Visit: Payer: 59

## 2014-09-17 VITALS — BP 137/74 | HR 85 | Temp 98.0°F | Resp 18 | Ht 74.0 in | Wt 156.6 lb

## 2014-09-17 DIAGNOSIS — G629 Polyneuropathy, unspecified: Secondary | ICD-10-CM

## 2014-09-17 DIAGNOSIS — C20 Malignant neoplasm of rectum: Secondary | ICD-10-CM

## 2014-09-17 DIAGNOSIS — Z5111 Encounter for antineoplastic chemotherapy: Secondary | ICD-10-CM

## 2014-09-17 DIAGNOSIS — D696 Thrombocytopenia, unspecified: Secondary | ICD-10-CM

## 2014-09-17 LAB — CBC WITH DIFFERENTIAL/PLATELET
BASO%: 1.2 % (ref 0.0–2.0)
BASOS ABS: 0.1 10*3/uL (ref 0.0–0.1)
EOS%: 4.6 % (ref 0.0–7.0)
Eosinophils Absolute: 0.2 10*3/uL (ref 0.0–0.5)
HCT: 39.5 % (ref 38.4–49.9)
HEMOGLOBIN: 12.9 g/dL — AB (ref 13.0–17.1)
LYMPH%: 18.2 % (ref 14.0–49.0)
MCH: 30.4 pg (ref 27.2–33.4)
MCHC: 32.7 g/dL (ref 32.0–36.0)
MCV: 92.8 fL (ref 79.3–98.0)
MONO#: 1 10*3/uL — ABNORMAL HIGH (ref 0.1–0.9)
MONO%: 24.4 % — AB (ref 0.0–14.0)
NEUT%: 51.6 % (ref 39.0–75.0)
NEUTROS ABS: 2.2 10*3/uL (ref 1.5–6.5)
Platelets: 93 10*3/uL — ABNORMAL LOW (ref 140–400)
RBC: 4.26 10*6/uL (ref 4.20–5.82)
RDW: 19.3 % — AB (ref 11.0–14.6)
WBC: 4.2 10*3/uL (ref 4.0–10.3)
lymph#: 0.8 10*3/uL — ABNORMAL LOW (ref 0.9–3.3)

## 2014-09-17 LAB — COMPREHENSIVE METABOLIC PANEL (CC13)
ALK PHOS: 178 U/L — AB (ref 40–150)
ALT: 44 U/L (ref 0–55)
AST: 33 U/L (ref 5–34)
Albumin: 3.7 g/dL (ref 3.5–5.0)
Anion Gap: 10 mEq/L (ref 3–11)
BUN: 11 mg/dL (ref 7.0–26.0)
CO2: 23 mEq/L (ref 22–29)
Calcium: 9.2 mg/dL (ref 8.4–10.4)
Chloride: 109 mEq/L (ref 98–109)
Creatinine: 0.8 mg/dL (ref 0.7–1.3)
EGFR: >90 mL/min/{1.73_m2} (ref >90)
Glucose: 70 mg/dl (ref 70–140)
Potassium: 3.8 mEq/L (ref 3.5–5.1)
SODIUM: 141 meq/L (ref 136–145)
TOTAL PROTEIN: 7 g/dL (ref 6.4–8.3)
Total Bilirubin: 0.38 mg/dL (ref 0.20–1.20)

## 2014-09-17 MED ORDER — DEXTROSE 5 % IV SOLN
Freq: Once | INTRAVENOUS | Status: AC
  Administered 2014-09-17: 11:00:00 via INTRAVENOUS

## 2014-09-17 MED ORDER — FLUOROURACIL CHEMO INJECTION 5 GM/100ML
2400.0000 mg/m2 | INTRAVENOUS | Status: DC
  Administered 2014-09-17: 4550 mg via INTRAVENOUS
  Filled 2014-09-17: qty 91

## 2014-09-17 MED ORDER — LEUCOVORIN CALCIUM INJECTION 350 MG
400.0000 mg/m2 | Freq: Once | INTRAVENOUS | Status: AC
  Administered 2014-09-17: 760 mg via INTRAVENOUS
  Filled 2014-09-17: qty 38

## 2014-09-17 MED ORDER — FLUOROURACIL CHEMO INJECTION 2.5 GM/50ML
400.0000 mg/m2 | Freq: Once | INTRAVENOUS | Status: AC
  Administered 2014-09-17: 750 mg via INTRAVENOUS
  Filled 2014-09-17: qty 15

## 2014-09-17 MED ORDER — SODIUM CHLORIDE 0.9 % IJ SOLN
10.0000 mL | INTRAMUSCULAR | Status: DC | PRN
  Administered 2014-09-17: 10 mL via INTRAVENOUS
  Filled 2014-09-17: qty 10

## 2014-09-17 NOTE — Telephone Encounter (Signed)
Pt confirmed labs/ov per 02/08 POF, gave pt AVS.... KJ, sent msg to add chemo

## 2014-09-17 NOTE — Patient Instructions (Signed)

## 2014-09-17 NOTE — Progress Notes (Signed)
  [place] OFFICE PROGRESS NOTE   Diagnosis: Rectal cancer   INTERVAL HISTORY:   [name] returns as scheduled. He completed another cycle of FOLFOX 09/03/2014. He reports tingling in the fingers and numbness following chemotherapy. This has resolved. He continues to have numbness in the toes. No rectal pain. He empties the ileostomy approximately twice daily.  Objective:  Vital signs in last 24 hours:  Blood pressure 137/74, pulse 85, temperature 98 F (36.7 C), temperature source Oral, resp. rate 18, height $RemoveBe'6\' 2"'xcTzJhNQJ$  (1.88 m), weight 156 lb 9.6 oz (71.033 kg), SpO2 100 %.    HEENT: No thrush or ulcers Resp: Lungs clear bilaterally Cardio: Regular rate and rhythm GI: No hepatomegaly, 4 stool in the ostomy bag Vascular: No leg edema, tiny palpable cord without erythema at the right mid arm Neuro: Mild decrease in vibratory sense at the fingertips bilaterally    Portacath/PICC-without erythema  Lab Results:  Lab Results  Component Value Date   WBC 4.2 09/17/2014   HGB 12.9* 09/17/2014   HCT 39.5 09/17/2014   MCV 92.8 09/17/2014   PLT 93* 09/17/2014   NEUTROABS 2.2 09/17/2014      Lab Results  Component Value Date   CEA 1.6 09/03/2014    Imaging:  No results found.  Medications: I have reviewed the patient's current medications.  Assessment/Plan: 1. 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 Completion of radiation and concurrent Xeloda 03/28/2014.  Low anterior resection, diverting loop ileostomy, and coloanal anastomosis  05/18/2014,ypT2,ypN2, no loss of mismatch repair protein expression  Cycle 1 adjuvant FOLFOX 06/11/2014  Cycle 2 adjuvant FOLFOX 06/25/2014  Cycle 3 adjuvant FOLFOX 07/09/2014  Cycle 4 adjuvant FOLFOX 07/23/2014  Cycle 5 adjuvant FOLFOX 08/06/2014  Cycle 6 adjuvant FOLFOX 08/20/2014  Cycle 7 adjuvant FOLFOX 09/03/2014  Cycle 8 adjuvant FOLFOX 09/17/2014-oxaliplatin held 2. History of rectal pain secondary to #1. Improved. 3. Phlebitis right upper inner arm 08/20/2014. Resolved. 4. Oxaliplatin neuropathy   Disposition:  He has completed 7 cycles of adjuvant FOLFOX. The plan is to proceed with cycle 8 today. Oxaliplatin will be held secondary to neuropathy and thrombocytopenia. He will complete a final cycle of adjuvant chemotherapy in 2 weeks.  Betsy Coder, MD  09/17/2014  4:52 PM

## 2014-09-17 NOTE — Patient Instructions (Signed)
[facility] Discharge Instructions for Patients Receiving Chemotherapy  Today you received the following chemotherapy agents leucovorin/fluorouracil.    To help prevent nausea and vomiting after your treatment, we encourage you to take your nausea medication as directed.     If you develop nausea and vomiting that is not controlled by your nausea medication, call the clinic.   BELOW ARE SYMPTOMS THAT SHOULD BE REPORTED IMMEDIATELY:  *FEVER GREATER THAN 100.5 F  *CHILLS WITH OR WITHOUT FEVER  NAUSEA AND VOMITING THAT IS NOT CONTROLLED WITH YOUR NAUSEA MEDICATION  *UNUSUAL SHORTNESS OF BREATH  *UNUSUAL BRUISING OR BLEEDING  TENDERNESS IN MOUTH AND THROAT WITH OR WITHOUT PRESENCE OF ULCERS  *URINARY PROBLEMS  *BOWEL PROBLEMS  UNUSUAL RASH Items with * indicate a potential emergency and should be followed up as soon as possible.  Feel free to call the clinic you have any questions or concerns. The clinic phone number is [phone].  

## 2014-09-17 NOTE — Telephone Encounter (Signed)
Per staff message and POF I have scheduled appts. Advised scheduler of appts. JMW  

## 2014-09-17 NOTE — Progress Notes (Signed)
Ok to treat per Dr Benay Spice, oxaliplatin omitted from treatment today.

## 2014-09-17 NOTE — [facility] Oncology Navigator Note (Signed)
Spoke with Seth Villanueva and his mother in treatment area. Seth Villanueva reports chemo is going well, main complaint is of fatigue and not being able to do what he has in the past. Confirmed with him that this is all temporary, and he understands. Mom thinks he is in "anger stage" of coping, that he has a short temper. Seth Villanueva agreed that is less patient, but is looking forward to treatment being over and getting his ostomy reversed. He is aware of the GI support group, but declines at this time. Thinks he would be willing to come when "all this is over, so I can help someone else".

## 2014-09-19 ENCOUNTER — Ambulatory Visit (HOSPITAL_BASED_OUTPATIENT_CLINIC_OR_DEPARTMENT_OTHER): Payer: 59

## 2014-09-19 ENCOUNTER — Ambulatory Visit: Payer: 59 | Admitting: Oncology

## 2014-09-19 DIAGNOSIS — C20 Malignant neoplasm of rectum: Secondary | ICD-10-CM

## 2014-09-19 MED ORDER — SODIUM CHLORIDE 0.9 % IJ SOLN
10.0000 mL | INTRAMUSCULAR | Status: DC | PRN
  Administered 2014-09-19: 10 mL
  Filled 2014-09-19: qty 10

## 2014-09-19 MED ORDER — HEPARIN SOD (PORK) LOCK FLUSH 100 UNIT/ML IV SOLN
500.0000 [IU] | Freq: Once | INTRAVENOUS | Status: AC | PRN
  Administered 2014-09-19: 500 [IU]
  Filled 2014-09-19: qty 5

## 2014-09-24 ENCOUNTER — Other Ambulatory Visit: Payer: Self-pay | Admitting: Oncology

## 2014-09-25 ENCOUNTER — Encounter: Payer: Self-pay | Admitting: Oncology

## 2014-09-25 NOTE — Progress Notes (Signed)
Put [employer] disability form on nurse's desk

## 2014-10-01 ENCOUNTER — Ambulatory Visit (HOSPITAL_BASED_OUTPATIENT_CLINIC_OR_DEPARTMENT_OTHER): Payer: 59 | Admitting: Nurse Practitioner

## 2014-10-01 ENCOUNTER — Ambulatory Visit (HOSPITAL_BASED_OUTPATIENT_CLINIC_OR_DEPARTMENT_OTHER): Payer: 59

## 2014-10-01 ENCOUNTER — Telehealth: Payer: Self-pay | Admitting: Nurse Practitioner

## 2014-10-01 ENCOUNTER — Other Ambulatory Visit (HOSPITAL_BASED_OUTPATIENT_CLINIC_OR_DEPARTMENT_OTHER): Payer: 59

## 2014-10-01 ENCOUNTER — Encounter: Payer: Self-pay | Admitting: Oncology

## 2014-10-01 ENCOUNTER — Encounter: Payer: Self-pay | Admitting: Nurse Practitioner

## 2014-10-01 VITALS — BP 123/73 | HR 75 | Temp 98.0°F | Resp 18 | Ht 74.0 in | Wt 158.1 lb

## 2014-10-01 DIAGNOSIS — Z5111 Encounter for antineoplastic chemotherapy: Secondary | ICD-10-CM

## 2014-10-01 DIAGNOSIS — C20 Malignant neoplasm of rectum: Secondary | ICD-10-CM

## 2014-10-01 DIAGNOSIS — Z95828 Presence of other vascular implants and grafts: Secondary | ICD-10-CM

## 2014-10-01 DIAGNOSIS — Z452 Encounter for adjustment and management of vascular access device: Secondary | ICD-10-CM

## 2014-10-01 DIAGNOSIS — G620 Drug-induced polyneuropathy: Secondary | ICD-10-CM

## 2014-10-01 LAB — COMPREHENSIVE METABOLIC PANEL (CC13)
ALBUMIN: 3.8 g/dL (ref 3.5–5.0)
ALT: 45 U/L (ref 0–55)
AST: 32 U/L (ref 5–34)
Alkaline Phosphatase: 163 U/L — ABNORMAL HIGH (ref 40–150)
Anion Gap: 9 mEq/L (ref 3–11)
BUN: 12.7 mg/dL (ref 7.0–26.0)
CALCIUM: 9.2 mg/dL (ref 8.4–10.4)
CHLORIDE: 109 meq/L (ref 98–109)
CO2: 25 mEq/L (ref 22–29)
Creatinine: 0.8 mg/dL (ref 0.7–1.3)
GLUCOSE: 115 mg/dL (ref 70–140)
Potassium: 3.9 mEq/L (ref 3.5–5.1)
SODIUM: 143 meq/L (ref 136–145)
Total Bilirubin: 0.4 mg/dL (ref 0.20–1.20)
Total Protein: 7 g/dL (ref 6.4–8.3)

## 2014-10-01 LAB — CBC WITH DIFFERENTIAL/PLATELET
BASO%: 1 % (ref 0.0–2.0)
Basophils Absolute: 0 10*3/uL (ref 0.0–0.1)
EOS ABS: 0.2 10*3/uL (ref 0.0–0.5)
EOS%: 3.9 % (ref 0.0–7.0)
HEMATOCRIT: 41 % (ref 38.4–49.9)
HEMOGLOBIN: 13.6 g/dL (ref 13.0–17.1)
LYMPH%: 13.8 % — ABNORMAL LOW (ref 14.0–49.0)
MCH: 31.2 pg (ref 27.2–33.4)
MCHC: 33.2 g/dL (ref 32.0–36.0)
MCV: 93.8 fL (ref 79.3–98.0)
MONO#: 0.6 10*3/uL (ref 0.1–0.9)
MONO%: 13.5 % (ref 0.0–14.0)
NEUT#: 3 10*3/uL (ref 1.5–6.5)
NEUT%: 67.8 % (ref 39.0–75.0)
Platelets: 105 10*3/uL — ABNORMAL LOW (ref 140–400)
RBC: 4.36 10*6/uL (ref 4.20–5.82)
RDW: 18.6 % — AB (ref 11.0–14.6)
WBC: 4.5 10*3/uL (ref 4.0–10.3)
lymph#: 0.6 10*3/uL — ABNORMAL LOW (ref 0.9–3.3)

## 2014-10-01 MED ORDER — FLUOROURACIL CHEMO INJECTION 2.5 GM/50ML
400.0000 mg/m2 | Freq: Once | INTRAVENOUS | Status: AC
  Administered 2014-10-01: 750 mg via INTRAVENOUS
  Filled 2014-10-01: qty 15

## 2014-10-01 MED ORDER — LEUCOVORIN CALCIUM INJECTION 350 MG
400.0000 mg/m2 | Freq: Once | INTRAMUSCULAR | Status: AC
  Administered 2014-10-01: 760 mg via INTRAVENOUS
  Filled 2014-10-01: qty 38

## 2014-10-01 MED ORDER — DEXTROSE 5 % IV SOLN
Freq: Once | INTRAVENOUS | Status: AC
  Administered 2014-10-01: 12:00:00 via INTRAVENOUS

## 2014-10-01 MED ORDER — SODIUM CHLORIDE 0.9 % IJ SOLN
10.0000 mL | INTRAMUSCULAR | Status: DC | PRN
  Administered 2014-10-01: 10 mL via INTRAVENOUS
  Filled 2014-10-01: qty 10

## 2014-10-01 MED ORDER — FLUOROURACIL CHEMO INJECTION 5 GM/100ML
2400.0000 mg/m2 | INTRAVENOUS | Status: DC
  Administered 2014-10-01: 4550 mg via INTRAVENOUS
  Filled 2014-10-01: qty 91

## 2014-10-01 NOTE — Progress Notes (Signed)
Faxed disability form to [employer] @ [phone]

## 2014-10-01 NOTE — Patient Instructions (Signed)

## 2014-10-01 NOTE — Telephone Encounter (Signed)
Gave avs & calendar for April. °

## 2014-10-01 NOTE — Patient Instructions (Signed)
[place] Discharge Instructions for Patients Receiving Chemotherapy  Today you received the following chemotherapy agents 5 FU/Leucovorin To help prevent nausea and vomiting after your treatment, we encourage you to take your nausea medication as prescribed.   If you develop nausea and vomiting that is not controlled by your nausea medication, call the clinic.   BELOW ARE SYMPTOMS THAT SHOULD BE REPORTED IMMEDIATELY:  *FEVER GREATER THAN 100.5 F  *CHILLS WITH OR WITHOUT FEVER  NAUSEA AND VOMITING THAT IS NOT CONTROLLED WITH YOUR NAUSEA MEDICATION  *UNUSUAL SHORTNESS OF BREATH  *UNUSUAL BRUISING OR BLEEDING  TENDERNESS IN MOUTH AND THROAT WITH OR WITHOUT PRESENCE OF ULCERS  *URINARY PROBLEMS  *BOWEL PROBLEMS  UNUSUAL RASH Items with * indicate a potential emergency and should be followed up as soon as possible.  Feel free to call the clinic you have any questions or concerns. The clinic phone number is [phone].

## 2014-10-01 NOTE — Progress Notes (Signed)
  [place] OFFICE PROGRESS NOTE   Diagnosis:  Rectal cancer  INTERVAL HISTORY:   Mr. Ende returns as scheduled. He completed cycle 8 FOLFOX on 09/17/2014. Oxaliplatin was held due to neuropathy and thrombocytopenia. He denies nausea/vomiting. No mouth sores. He notes diarrhea today. He attributes this to food he ate yesterday. He took Imodium with improvement. He continues to have tingling in the hands. He has persistent numbness in the feet.  Objective:  Vital signs in last 24 hours:  Blood pressure 123/73, pulse 75, temperature 98 F (36.7 C), temperature source Oral, resp. rate 18, height $RemoveBe'6\' 2"'UzeaHgXig$  (1.88 m), weight 158 lb 1.6 oz (71.714 kg), SpO2 99 %.    HEENT: No thrush or ulcers. Resp: Lungs clear bilaterally. Cardio: Regular rate and rhythm. GI: Abdomen soft and nontender. Liquid stool in the ostomy bag. No hepatomegaly. Vascular: No leg edema.  Skin: Palms dry appearing. Port-A-Cath without erythema.    Lab Results:  Lab Results  Component Value Date   WBC 4.5 10/01/2014   HGB 13.6 10/01/2014   HCT 41.0 10/01/2014   MCV 93.8 10/01/2014   PLT 105* 10/01/2014   NEUTROABS 3.0 10/01/2014    Imaging:  No results found.  Medications: I have reviewed the patient's current medications.  Assessment/Plan: 1. 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 Completion of radiation and concurrent Xeloda 03/28/2014.  Low anterior resection, diverting loop ileostomy, and coloanal anastomosis 05/18/2014,ypT2,ypN2, no loss of mismatch repair protein  expression  Cycle 1 adjuvant FOLFOX 06/11/2014  Cycle 2 adjuvant FOLFOX 06/25/2014  Cycle 3 adjuvant FOLFOX 07/09/2014  Cycle 4 adjuvant FOLFOX 07/23/2014  Cycle 5 adjuvant FOLFOX 08/06/2014  Cycle 6 adjuvant FOLFOX 08/20/2014  Cycle 7 adjuvant FOLFOX 09/03/2014  Cycle 8 adjuvant FOLFOX 09/17/2014-oxaliplatin held  Cycle 9 adjuvant FOLFOX 10/01/2014-oxaliplatin held 2. History of rectal pain secondary to #1. Improved. 3. Phlebitis right upper inner arm 08/20/2014. Resolved. 4. Oxaliplatin neuropathy   Disposition: Seth Villanueva appears stable. He has completed 8 cycles of adjuvant FOLFOX. Oxaliplatin was held with cycle 8 due to neuropathy and thrombocytopenia. Plan to proceed with the ninth and final cycle today as scheduled. Oxaliplatin will be held with due to persistent neuropathy symptoms.  He will return for a follow-up visit in 6 weeks. He will contact the office in the interim with any problems.  Plan reviewed with Dr. Benay Spice.    Ned Card ANP/GNP-BC   10/01/2014  11:37 AM

## 2014-10-03 ENCOUNTER — Ambulatory Visit (HOSPITAL_BASED_OUTPATIENT_CLINIC_OR_DEPARTMENT_OTHER): Payer: 59

## 2014-10-03 DIAGNOSIS — C20 Malignant neoplasm of rectum: Secondary | ICD-10-CM

## 2014-10-03 DIAGNOSIS — Z452 Encounter for adjustment and management of vascular access device: Secondary | ICD-10-CM

## 2014-10-03 MED ORDER — SODIUM CHLORIDE 0.9 % IJ SOLN
10.0000 mL | INTRAMUSCULAR | Status: DC | PRN
  Administered 2014-10-03: 10 mL
  Filled 2014-10-03: qty 10

## 2014-10-03 MED ORDER — HEPARIN SOD (PORK) LOCK FLUSH 100 UNIT/ML IV SOLN
500.0000 [IU] | Freq: Once | INTRAVENOUS | Status: AC | PRN
Start: 2014-10-03 — End: 2014-10-03
  Administered 2014-10-03: 500 [IU]
  Filled 2014-10-03: qty 5

## 2014-10-05 ENCOUNTER — Encounter: Payer: Self-pay | Admitting: Oncology

## 2014-10-05 NOTE — Progress Notes (Signed)
Put [employer] disability form on nurse's desk

## 2014-10-09 ENCOUNTER — Encounter: Payer: Self-pay | Admitting: Oncology

## 2014-10-09 NOTE — Progress Notes (Signed)
Faxing fmla/disability to [employer]. [phone]

## 2014-10-11 ENCOUNTER — Ambulatory Visit
Admission: RE | Admit: 2014-10-11 | Discharge: 2014-10-11 | Disposition: A | Discharge status: Home / Self Care | Payer: 59 | Source: Ambulatory Visit | Attending: Radiation Oncology | Admitting: Radiation Oncology

## 2014-10-11 ENCOUNTER — Encounter: Payer: Self-pay | Admitting: Radiation Oncology

## 2014-10-11 VITALS — BP 151/86 | HR 96 | Temp 97.8°F | Resp 20 | Ht 74.0 in | Wt 159.9 lb

## 2014-10-11 DIAGNOSIS — C20 Malignant neoplasm of rectum: Secondary | ICD-10-CM

## 2014-10-11 NOTE — Progress Notes (Signed)
Follow up s/p rad txs Rectal 02/19/14-03/28/14, has colostomy,stoma red, has green loose stool, great appetite, no fatigue, sees Dr. Ned Card, MD 10/29/14 to discuss surgery reversal, , feet still numb some sdtated, when voiding fels like his abdominal muscles tighten but resolves quickly, sees Dr. Benay Spice 11/12/14,  3:59 PM

## 2014-10-11 NOTE — Progress Notes (Signed)
Radiation Oncology         [phone]) [phone] ________________________________  Name: [name]. MRN: [id number]  Date: 10/11/2014  DOB: [birth year]  Follow-Up Visit Note  CC: Leonides Sake, MD  Gayland Curry, MD  Diagnosis:     ICD-9-CM ICD-10-CM   1. Rectal cancer 154.1 C20     Interval Since Last Radiation:  Approximately 7 months   Narrative:  The patient returns today for routine follow-up.  The patient is is doing well clinically. No signs of ongoing toxicity or side effects from his prior radiation treatment. No nausea. No ongoing diarrhea. Some occasional muscle tightness when he urinates.                              ALLERGIES:  is allergic to zithromax and compazine.  Meds: Current Outpatient Prescriptions  Medication Sig Dispense Refill  . acetaminophen (TYLENOL) 325 MG tablet Take 650 mg by mouth once as needed for mild pain.     Marland Kitchen ibuprofen (ADVIL,MOTRIN) 200 MG tablet Take 200 mg by mouth every 6 (six) hours as needed (pain.).     Marland Kitchen lidocaine-prilocaine (EMLA) cream Apply 1 application topically as needed. Apply to [facility] site 1-2 hours prior to stick and cover with plastic wrap to numb site 30 g 11  . Loperamide HCl (IMODIUM A-D PO) Take 2 mg by mouth once as needed (loose stool).     . LORazepam (ATIVAN) 0.5 MG tablet Take 1 tablet (0.5 mg total) by mouth every 6 (six) hours as needed. 20 tablet 0  . ondansetron (ZOFRAN) 8 MG tablet Take 1 tablet (8 mg total) by mouth every 8 (eight) hours as needed for nausea or vomiting. 30 tablet 1  . oxyCODONE (OXY IR/ROXICODONE) 5 MG immediate release tablet Take 1-2 tablets (5-10 mg total) by mouth every 4 (four) hours as needed for severe pain or breakthrough pain (Try to alternate ibuprofen 400 mg to attempt to wean off narcotic). 30 tablet 0  . polycarbophil (FIBERCON) 625 MG tablet Take 1 tablet (625 mg total) by mouth 2 (two) times daily.    . pseudoephedrine (SUDAFED) 30 MG tablet Take 30 mg by mouth every 4 (four) hours  as needed for congestion.    . Pseudoephedrine-Ibuprofen (ADVIL COLD/SINUS PO) Take 1 tablet by mouth daily as needed (cold/sinus symptoms.).    Marland Kitchen simethicone (MYLICON) 932 MG chewable tablet Chew 125 mg by mouth every 6 (six) hours as needed for flatulence (gel pill OTC).    Marland Kitchen clotrimazole (MYCELEX) 10 MG troche Take 1 tablet (10 mg total) by mouth 4 (four) times daily. (Patient not taking: Reported on 10/11/2014) 120 tablet 0  . PRESCRIPTION MEDICATION palonosetron (ALOXI) injection 0.25 mg 0.25 mg Once 08/20/2014    . PRESCRIPTION MEDICATION oxaliplatin (ELOXATIN) 160 mg in dextrose 5 % 500 mL chemo infusion 85 mg/m2  1.9 m2 (Treatment Plan Actual) Once 08/20/2014     No current facility-administered medications for this encounter.    Physical Findings: The patient is in no acute distress. Patient is alert and oriented.  height is 6\' 2"  (1.88 m) and weight is 159 lb 14.4 oz (72.53 kg). His oral temperature is 97.8 F (36.6 C). His blood pressure is 151/86 and his pulse is 96. His respiration is 20. .     Lab Findings: Lab Results  Component Value Date   WBC 4.5 10/01/2014   HGB 13.6 10/01/2014   HCT 41.0  10/01/2014   MCV 93.8 10/01/2014   PLT 105* 10/01/2014     Radiographic Findings: No results found.  Impression:    The patient clinically is doing well. The patient is continuing followup with medical oncology. The patient is scheduled to see Dr. Marcello Moores in several weeks to discuss surgery regarding reversal.  Plan:  The patient will followup in our clinic on a prn basis.  I spent 10 minutes with the patient today, the majority of which was spent counseling the patient on the diagnosis of cancer and coordinating care.   Jodelle Gross, M.D., Ph.D.

## 2014-10-30 ENCOUNTER — Other Ambulatory Visit: Payer: Self-pay | Admitting: General Surgery

## 2014-10-30 NOTE — H&P (Signed)
Rowe Robert B. Mankin 10/30/2014 12:09 PM Location: [facility] Patient #: [id number] DOB: [birth year] Single / Language: [name] / Race: White Male History of Present Illness [name]; 1/60/7371 12:48 PM) Patient words: recheck rectum.  The patient is a 34 year old male presenting for a post-operative visit. He has finished his chemotherapy. He is scheduled to see Dr. Benay Spice back in mid April. He denies any rectal symptoms or pain. He is tolerating his ostomy well. 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 His pathology showed a T2N2 adenocarcinoma with negative margins. He is finishing adjuvant chemotherapy.  Past Surgical History [name], MD; 5/46/2703 12:54 PM) LAR  Allergies Elbert Ewings, CMA; 10/30/2014 12:10 PM) Azithromycin *CHEMICALS* Hives. Compazine *ANTIPSYCHOTICS/ANTIMANIC AGENTS* anxiety  Medication History Elbert Ewings, CMA; 10/30/2014 12:10 PM) Flagyl (500MG  Tablet, 2 (two) Tablet Oral SEE NOTE, Taken starting 04/25/2014) Active. (Take at 2pm, 3pm, and 10pm the day prior to your colon operation) Neomycin Sulfate (500MG  Tablet, 2 (two) Tablet Oral SEE NOTE, Taken starting 04/25/2014) Active. (TAKE TWO TABLETS AT 2 PM, 3 PM, AND 10 PM THE DAY PRIOR TO SURGERY) OxyCODONE HCl (5MG  Tablet, Oral prn) Active. Hydrocortisone Acetate (25MG  Suppository, Rectal prn) Active. Polyethylene Glycol 3350 (Oral prn) Active. Acetaminophen (325MG  Tablet, Oral prn) Active. Ibuprofen (200MG  Capsule, Oral prn) Active. Imodium A-D (2MG  Tablet, Oral prn) Active. Sudafed (30MG  Tablet, Oral prn) Active. Mylicon (40MG /0.6ML Suspension, Oral prn) Active. Medications Reconciled  Social History [name], MD; 5/00/9381 12:52 PM) No  caffeine use No drug use Alcohol use Occasional alcohol use. Tobacco use Former smoker.  Family History [name], MD; 04/07/9370 12:52 PM) Arthritis Family Members In General. Breast Cancer Family Members In General. Heart Disease Family Members In General. Hypertension Family Members In General. Thyroid problems Family Members In General. Heart disease in male family member before age 67 Heart disease in male family member before age 32     Review of Systems [name]; 6/96/7893 12:48 PM) General Not Present- Appetite Loss, Chills, Fatigue, Fever, Night Sweats, Weight Gain and Weight Loss. Skin Not Present- Change in Wart/Mole, Dryness, Hives, Jaundice, New Lesions, Non-Healing Wounds, Rash and Ulcer. HEENT Not Present- Earache, Hearing Loss, Hoarseness, Nose Bleed, Oral Ulcers, Ringing in the Ears, Seasonal Allergies, Sinus Pain, Sore Throat, Visual Disturbances, Wears glasses/contact lenses and Yellow Eyes. Respiratory Not Present- Bloody sputum, Chronic Cough, Difficulty Breathing, Snoring and Wheezing. Breast Not Present- Breast Mass, Breast Pain, Nipple Discharge and Skin Changes. Cardiovascular Not Present- Chest Pain, Difficulty Breathing Lying Down, Leg Cramps, Palpitations, Rapid Heart Rate, Shortness of Breath and Swelling of Extremities. Gastrointestinal Not Present- Abdominal Pain, Bloody Stool, Change in Bowel Habits, Chronic diarrhea, Constipation, Difficulty Swallowing, Excessive gas, Gets full quickly at meals, Indigestion, Nausea, Rectal Pain and Vomiting. Male Genitourinary Not Present- Blood in Urine, Change in Urinary Stream, Frequency, Impotence, Nocturia, Painful Urination, Urgency and Urine Leakage. Musculoskeletal Not Present- Back Pain, Joint Pain, Joint Stiffness, Muscle Pain, Muscle Weakness and Swelling of Extremities. Neurological Not Present- Decreased Memory, Fainting, Headaches, Numbness, Seizures, Tingling, Tremor, Trouble walking  and Weakness. Psychiatric Not Present- Anxiety, Bipolar, Change in Sleep Pattern, Depression, Fearful and Frequent crying. Endocrine Not Present- Cold Intolerance, Excessive Hunger, Hair Changes, Heat Intolerance and New Diabetes. Hematology Not Present- Easy Bruising, Excessive bleeding, Gland problems, HIV and Persistent Infections.  Vitals Elbert Ewings CMA; 10/30/2014 12:10 PM) 10/30/2014 12:10 PM Weight: 157 lb Height: 74in Body Surface Area: 1.93 m Body Mass Index: 20.16 kg/m Temp.: 105F(Temporal)  Pulse: 89 (Regular)  Resp.: 17 (Unlabored)  BP: 120/72 (Sitting, Left Arm, Standard)     Physical Exam [name]; 8/82/8003 12:49 PM)  General Mental Status-Alert. General Appearance-Cooperative.  Chest and Lung Exam Auscultation Breath sounds - Normal.  Cardiovascular Auscultation Heart Sounds - Normal heart sounds.  Abdomen Palpation/Percussion Palpation and Percussion of the abdomen reveal - Soft and Non Tender.  Rectal Anorectal Exam Internal - Note: tumor scarring noted approximately 4 cm from anal verge just above the coccyx posteriorly. This is unchanged from previous exam.    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 This was initially attributed to hemorrhoids. He did not get better. He ultimately ended up seeing a gastroenterologist and was found to have a rectal mass on physical exam. Biopsies confirmed adenocarcinoma. CT scan of chest abdomen and pelvis were negative for metastatic disease. EUS on 01/30/2014 showed a T3 N2 lesion. He underwent a LAR in October 2015. Coloanal anastomosis was performed with loop ileostomy. He then completed adjuvant chemotherapy for a teeth 2 into adenocarcinoma with negative margins. He has a persistent anastomotic stricture.  I have recommended an exam under  anesthesia with possible dilation. Risks include bleeding, pain and colon perforation.

## 2014-11-12 ENCOUNTER — Ambulatory Visit (HOSPITAL_BASED_OUTPATIENT_CLINIC_OR_DEPARTMENT_OTHER): Payer: 59

## 2014-11-12 ENCOUNTER — Telehealth: Payer: Self-pay | Admitting: Oncology

## 2014-11-12 ENCOUNTER — Other Ambulatory Visit (HOSPITAL_BASED_OUTPATIENT_CLINIC_OR_DEPARTMENT_OTHER): Payer: 59

## 2014-11-12 ENCOUNTER — Ambulatory Visit (HOSPITAL_BASED_OUTPATIENT_CLINIC_OR_DEPARTMENT_OTHER): Payer: 59 | Admitting: Oncology

## 2014-11-12 ENCOUNTER — Encounter (HOSPITAL_BASED_OUTPATIENT_CLINIC_OR_DEPARTMENT_OTHER): Payer: Self-pay | Admitting: *Deleted

## 2014-11-12 VITALS — BP 134/75 | HR 73 | Temp 98.0°F | Resp 18 | Ht 74.0 in | Wt 160.5 lb

## 2014-11-12 DIAGNOSIS — C20 Malignant neoplasm of rectum: Secondary | ICD-10-CM

## 2014-11-12 DIAGNOSIS — Z95828 Presence of other vascular implants and grafts: Secondary | ICD-10-CM

## 2014-11-12 LAB — CBC WITH DIFFERENTIAL/PLATELET
BASO%: 0.2 % (ref 0.0–2.0)
Basophils Absolute: 0 10*3/uL (ref 0.0–0.1)
EOS%: 6.7 % (ref 0.0–7.0)
Eosinophils Absolute: 0.3 10*3/uL (ref 0.0–0.5)
HEMATOCRIT: 42.2 % (ref 38.4–49.9)
HGB: 14.5 g/dL (ref 13.0–17.1)
LYMPH%: 15.9 % (ref 14.0–49.0)
MCH: 32.2 pg (ref 27.2–33.4)
MCHC: 34.4 g/dL (ref 32.0–36.0)
MCV: 93.8 fL (ref 79.3–98.0)
MONO#: 0.5 10*3/uL (ref 0.1–0.9)
MONO%: 11.6 % (ref 0.0–14.0)
NEUT%: 65.6 % (ref 39.0–75.0)
NEUTROS ABS: 2.8 10*3/uL (ref 1.5–6.5)
PLATELETS: 141 10*3/uL (ref 140–400)
RBC: 4.5 10*6/uL (ref 4.20–5.82)
RDW: 14.3 % (ref 11.0–14.6)
WBC: 4.2 10*3/uL (ref 4.0–10.3)
lymph#: 0.7 10*3/uL — ABNORMAL LOW (ref 0.9–3.3)

## 2014-11-12 LAB — COMPREHENSIVE METABOLIC PANEL (CC13)
ALT: 26 U/L (ref 0–55)
AST: 22 U/L (ref 5–34)
Albumin: 3.9 g/dL (ref 3.5–5.0)
Alkaline Phosphatase: 125 U/L (ref 40–150)
Anion Gap: 11 meq/L (ref 3–11)
BUN: 10 mg/dL (ref 7.0–26.0)
CO2: 23 meq/L (ref 22–29)
Calcium: 9 mg/dL (ref 8.4–10.4)
Chloride: 108 meq/L (ref 98–109)
Creatinine: 0.8 mg/dL (ref 0.7–1.3)
EGFR: >90 ml/min/1.73 m2
Glucose: 134 mg/dL (ref 70–140)
Potassium: 3.9 meq/L (ref 3.5–5.1)
Sodium: 141 meq/L (ref 136–145)
Total Bilirubin: 0.54 mg/dL (ref 0.20–1.20)
Total Protein: 6.8 g/dL (ref 6.4–8.3)

## 2014-11-12 MED ORDER — HEPARIN SOD (PORK) LOCK FLUSH 100 UNIT/ML IV SOLN
500.0000 [IU] | Freq: Once | INTRAVENOUS | Status: AC
  Administered 2014-11-12: 500 [IU] via INTRAVENOUS
  Filled 2014-11-12: qty 5

## 2014-11-12 MED ORDER — SODIUM CHLORIDE 0.9 % IJ SOLN
10.0000 mL | INTRAMUSCULAR | Status: DC | PRN
  Administered 2014-11-12: 10 mL via INTRAVENOUS
  Filled 2014-11-12: qty 10

## 2014-11-12 NOTE — Patient Instructions (Signed)

## 2014-11-12 NOTE — Progress Notes (Signed)
NPO AFTER MN.  ARRIVE AT 0600.  CURRENT LAB RESULTS IN CHART AND EPIC.   

## 2014-11-12 NOTE — Progress Notes (Signed)
  [facility] OFFICE PROGRESS NOTE   Diagnosis: Rectal cancer  INTERVAL HISTORY:   [name] returns as scheduled. He feels well. He is no longer taking pain medication. He empties the ileostomy bag approximately 3 times per day. The numbness in the fingers and toes persist. This does not interfere with activity. He will undergo an endoscopic evaluation of the rectum by Dr. Marcello Moores later this week prior to planning the ileostomy takedown.  Objective:  Vital signs in last 24 hours:  Blood pressure 134/75, pulse 73, temperature 98 F (36.7 C), temperature source Oral, resp. rate 18, height $RemoveBe'6\' 2"'[id number]$  (1.88 m), weight 160 lb 8 oz (72.802 kg), SpO2 100 %.    HEENT: Neck without mass Lymphatics: No cervical, supra-clavicular, axillary, or inguinal nodes Resp: Lungs clear bilaterally Cardio: Regular rate and rhythm GI: No hepatomegaly, right lower quadrant ileostomy, nontender, no mass Vascular: No leg edema  Portacath/PICC-without erythema  Lab Results:  Lab Results  Component Value Date   WBC 4.2 11/12/2014   HGB 14.5 11/12/2014   HCT 42.2 11/12/2014   MCV 93.8 11/12/2014   PLT 141 11/12/2014   NEUTROABS 2.8 11/12/2014      Lab Results  Component Value Date   CEA 1.6 09/03/2014    Medications: I have reviewed the patient's current medications.  Assessment/Plan: 1. 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 Completion of radiation and concurrent Xeloda 03/28/2014.  Low anterior resection, diverting loop ileostomy, and coloanal anastomosis  05/18/2014,ypT2,ypN2, no loss of mismatch repair protein expression  Cycle 1 adjuvant FOLFOX 06/11/2014  Cycle 2 adjuvant FOLFOX 06/25/2014  Cycle 3 adjuvant FOLFOX 07/09/2014  Cycle 4 adjuvant FOLFOX 07/23/2014  Cycle 5 adjuvant FOLFOX 08/06/2014  Cycle 6 adjuvant FOLFOX 08/20/2014  Cycle 7 adjuvant FOLFOX 09/03/2014  Cycle 8 adjuvant FOLFOX 09/17/2014-oxaliplatin held  Cycle 9 adjuvant FOLFOX 10/01/2014-oxaliplatin held 2. History of rectal pain secondary to #1. Improved. 3. Phlebitis right upper inner arm 08/20/2014. Resolved. 4. Oxaliplatin neuropathy    Disposition:  Seth Villanueva appears well. He will follow-up with Dr. Marcello Moores to plan the ileostomy takedown procedure. He will return for a Port-A-Cath flush in 6 weeks. He will be scheduled for an office visit and restaging CT evaluation in the late June. We will refer him for Port-A-Cath removal if the CTs reveal no evidence of recurrent tumor.  Betsy Coder, MD  11/12/2014  10:43 AM

## 2014-11-12 NOTE — Telephone Encounter (Signed)
gave and printed appt sched and avs for pt for June....sed added tx gv barium

## 2014-11-13 ENCOUNTER — Telehealth: Payer: Self-pay | Admitting: Oncology

## 2014-11-13 NOTE — Telephone Encounter (Signed)
lvm for pt regarding to 5.24 appt moved to 6.7 per Tonya wanted moved to June....mailed pt appt sched and letter

## 2014-11-15 ENCOUNTER — Ambulatory Visit (HOSPITAL_BASED_OUTPATIENT_CLINIC_OR_DEPARTMENT_OTHER)
Admission: RE | Admit: 2014-11-15 | Discharge: 2014-11-15 | Disposition: A | Discharge status: Home / Self Care | Payer: 59 | Source: Ambulatory Visit | Attending: General Surgery | Admitting: General Surgery

## 2014-11-15 ENCOUNTER — Encounter (HOSPITAL_BASED_OUTPATIENT_CLINIC_OR_DEPARTMENT_OTHER)
Admission: RE | Disposition: A | Discharge status: Home / Self Care | Payer: Self-pay | Source: Ambulatory Visit | Attending: General Surgery

## 2014-11-15 ENCOUNTER — Ambulatory Visit (HOSPITAL_BASED_OUTPATIENT_CLINIC_OR_DEPARTMENT_OTHER): Payer: 59 | Admitting: Anesthesiology

## 2014-11-15 ENCOUNTER — Encounter (HOSPITAL_BASED_OUTPATIENT_CLINIC_OR_DEPARTMENT_OTHER): Payer: Self-pay | Admitting: *Deleted

## 2014-11-15 DIAGNOSIS — Z888 Allergy status to other drugs, medicaments and biological substances status: Secondary | ICD-10-CM | POA: Diagnosis not present

## 2014-11-15 DIAGNOSIS — K649 Unspecified hemorrhoids: Secondary | ICD-10-CM | POA: Diagnosis not present

## 2014-11-15 DIAGNOSIS — I4510 Unspecified right bundle-branch block: Secondary | ICD-10-CM | POA: Insufficient documentation

## 2014-11-15 DIAGNOSIS — K624 Stenosis of anus and rectum: Secondary | ICD-10-CM | POA: Insufficient documentation

## 2014-11-15 DIAGNOSIS — Z87891 Personal history of nicotine dependence: Secondary | ICD-10-CM | POA: Diagnosis not present

## 2014-11-15 DIAGNOSIS — Z980 Intestinal bypass and anastomosis status: Secondary | ICD-10-CM | POA: Diagnosis not present

## 2014-11-15 DIAGNOSIS — C20 Malignant neoplasm of rectum: Secondary | ICD-10-CM | POA: Diagnosis not present

## 2014-11-15 DIAGNOSIS — Z881 Allergy status to other antibiotic agents status: Secondary | ICD-10-CM | POA: Insufficient documentation

## 2014-11-15 HISTORY — DX: Unspecified right bundle-branch block: I45.10

## 2014-11-15 HISTORY — DX: Presence of spectacles and contact lenses: Z97.3

## 2014-11-15 HISTORY — DX: Personal history of antineoplastic chemotherapy: Z92.21

## 2014-11-15 HISTORY — PX: FLEXIBLE SIGMOIDOSCOPY: SHX5431

## 2014-11-15 HISTORY — DX: Malignant neoplasm of rectum: C20

## 2014-11-15 HISTORY — DX: Personal history of thrombophlebitis: Z86.72

## 2014-11-15 HISTORY — PX: RECTAL EXAM UNDER ANESTHESIA: SHX6399

## 2014-11-15 HISTORY — DX: Drug-induced polyneuropathy: G62.0

## 2014-11-15 HISTORY — DX: Adverse effect of antineoplastic and immunosuppressive drugs, initial encounter: T45.1X5A

## 2014-11-15 SURGERY — EXAM UNDER ANESTHESIA, RECTUM
Anesthesia: Monitor Anesthesia Care | Site: Rectum

## 2014-11-15 MED ORDER — OXYCODONE-ACETAMINOPHEN 5-325 MG PO TABS: 1.0000 | ORAL_TABLET | ORAL | Status: DC | PRN

## 2014-11-15 MED ORDER — SODIUM CHLORIDE 0.9 % IV SOLN
250.0000 mg | INTRAVENOUS | Status: DC | PRN
  Administered 2014-11-15: 14 ug/kg/min via INTRAVENOUS

## 2014-11-15 MED ORDER — ACETAMINOPHEN 325 MG PO TABS
650.0000 mg | ORAL_TABLET | ORAL | Status: DC | PRN
  Filled 2014-11-15: qty 2

## 2014-11-15 MED ORDER — FENTANYL CITRATE 0.05 MG/ML IJ SOLN
25.0000 ug | INTRAMUSCULAR | Status: DC | PRN
Start: 2014-11-15 — End: 2014-11-15
  Filled 2014-11-15: qty 1

## 2014-11-15 MED ORDER — MIDAZOLAM HCL 2 MG/2ML IJ SOLN
INTRAMUSCULAR | Status: AC
  Filled 2014-11-15: qty 2

## 2014-11-15 MED ORDER — LIDOCAINE HCL (CARDIAC) 20 MG/ML IV SOLN
INTRAVENOUS | Status: DC | PRN
  Administered 2014-11-15: 50 mg via INTRAVENOUS

## 2014-11-15 MED ORDER — FENTANYL CITRATE 0.05 MG/ML IJ SOLN
INTRAMUSCULAR | Status: AC
  Filled 2014-11-15: qty 4

## 2014-11-15 MED ORDER — PROPOFOL 10 MG/ML IV EMUL
INTRAVENOUS | Status: DC | PRN
  Administered 2014-11-15: 140 ug/kg/min via INTRAVENOUS

## 2014-11-15 MED ORDER — KETAMINE HCL 50 MG/ML IJ SOLN
INTRAMUSCULAR | Status: DC | PRN
  Administered 2014-11-15: 20 mg via INTRAMUSCULAR
  Administered 2014-11-15 (×2): 5 mg via INTRAMUSCULAR

## 2014-11-15 MED ORDER — OXYCODONE HCL 5 MG PO TABS
5.0000 mg | ORAL_TABLET | ORAL | Status: DC | PRN
  Administered 2014-11-15: 5 mg via ORAL
  Filled 2014-11-15: qty 1

## 2014-11-15 MED ORDER — PROMETHAZINE HCL 25 MG/ML IJ SOLN
6.2500 mg | INTRAMUSCULAR | Status: DC | PRN
  Filled 2014-11-15: qty 1

## 2014-11-15 MED ORDER — METOCLOPRAMIDE HCL 5 MG/ML IJ SOLN
INTRAMUSCULAR | Status: AC
  Filled 2014-11-15: qty 2

## 2014-11-15 MED ORDER — DEXAMETHASONE SODIUM PHOSPHATE 4 MG/ML IJ SOLN
INTRAMUSCULAR | Status: DC | PRN
  Administered 2014-11-15: 10 mg via INTRAVENOUS

## 2014-11-15 MED ORDER — LACTATED RINGERS IV SOLN
INTRAVENOUS | Status: DC
  Administered 2014-11-15 (×2): via INTRAVENOUS
  Filled 2014-11-15: qty 1000

## 2014-11-15 MED ORDER — ACETAMINOPHEN 10 MG/ML IV SOLN
INTRAVENOUS | Status: DC | PRN
  Administered 2014-11-15: 1000 mg via INTRAVENOUS

## 2014-11-15 MED ORDER — METOCLOPRAMIDE HCL 5 MG/ML IJ SOLN
10.0000 mg | Freq: Once | INTRAMUSCULAR | Status: AC
  Administered 2014-11-15: 10 mg via INTRAVENOUS
  Filled 2014-11-15: qty 2

## 2014-11-15 MED ORDER — SODIUM CHLORIDE 0.9 % IJ SOLN
3.0000 mL | Freq: Two times a day (BID) | INTRAMUSCULAR | Status: DC
  Filled 2014-11-15: qty 3

## 2014-11-15 MED ORDER — OXYCODONE HCL 5 MG PO TABS
5.0000 mg | ORAL_TABLET | ORAL | Status: DC | PRN
  Filled 2014-11-15: qty 2

## 2014-11-15 MED ORDER — SODIUM CHLORIDE 0.9 % IV SOLN
250.0000 mL | INTRAVENOUS | Status: DC | PRN
  Filled 2014-11-15: qty 250

## 2014-11-15 MED ORDER — PROPOFOL 10 MG/ML IV BOLUS
INTRAVENOUS | Status: DC | PRN
  Administered 2014-11-15 (×2): 50 mg via INTRAVENOUS

## 2014-11-15 MED ORDER — FENTANYL CITRATE 0.05 MG/ML IJ SOLN
INTRAMUSCULAR | Status: DC | PRN
  Administered 2014-11-15: 50 ug via INTRAVENOUS
  Administered 2014-11-15 (×2): 25 ug via INTRAVENOUS

## 2014-11-15 MED ORDER — ONDANSETRON HCL 4 MG/2ML IJ SOLN
INTRAMUSCULAR | Status: DC | PRN
  Administered 2014-11-15: 4 mg via INTRAVENOUS

## 2014-11-15 MED ORDER — ACETAMINOPHEN 650 MG RE SUPP
650.0000 mg | RECTAL | Status: DC | PRN
  Filled 2014-11-15: qty 1

## 2014-11-15 MED ORDER — SODIUM CHLORIDE 0.9 % IJ SOLN
3.0000 mL | INTRAMUSCULAR | Status: DC | PRN
  Filled 2014-11-15: qty 3

## 2014-11-15 MED ORDER — KETAMINE HCL 50 MG/ML IJ SOLN
INTRAMUSCULAR | Status: AC
  Filled 2014-11-15: qty 10

## 2014-11-15 MED ORDER — MIDAZOLAM HCL 5 MG/5ML IJ SOLN
INTRAMUSCULAR | Status: DC | PRN
  Administered 2014-11-15: 2 mg via INTRAVENOUS
  Administered 2014-11-15 (×2): 1 mg via INTRAVENOUS

## 2014-11-15 MED ORDER — BUPIVACAINE-EPINEPHRINE 0.5% -1:200000 IJ SOLN
INTRAMUSCULAR | Status: DC | PRN
  Administered 2014-11-15: 20 mL

## 2014-11-15 MED ORDER — OXYCODONE HCL 5 MG PO TABS
ORAL_TABLET | ORAL | Status: AC
  Filled 2014-11-15: qty 1

## 2014-11-15 SURGICAL SUPPLY — 59 items
BENZOIN TINCTURE PRP APPL 2/3 (GAUZE/BANDAGES/DRESSINGS) ×3 IMPLANT
BLADE HEX COATED 2.75 (ELECTRODE) ×3 IMPLANT
BLADE SURG 10 STRL SS (BLADE) ×3 IMPLANT
BLADE SURG 15 STRL LF DISP TIS (BLADE) IMPLANT
BLADE SURG 15 STRL SS (BLADE)
BRIEF STRETCH FOR OB PAD LRG (UNDERPADS AND DIAPERS) ×6 IMPLANT
CANISTER SUCTION 2500CC (MISCELLANEOUS) ×6 IMPLANT
CLOTH BEACON ORANGE TIMEOUT ST (SAFETY) ×3 IMPLANT
COVER BACK TABLE 60X90IN (DRAPES) ×3 IMPLANT
COVER MAYO STAND STRL (DRAPES) ×3 IMPLANT
DECANTER SPIKE VIAL GLASS SM (MISCELLANEOUS) ×3 IMPLANT
DRAPE LG THREE QUARTER DISP (DRAPES) ×6 IMPLANT
DRAPE PED LAPAROTOMY (DRAPES) ×3 IMPLANT
DRAPE UNDERBUTTOCKS STRL (DRAPE) ×3 IMPLANT
DRAPE UTILITY XL STRL (DRAPES) ×3 IMPLANT
DRSG PAD ABDOMINAL 8X10 ST (GAUZE/BANDAGES/DRESSINGS) ×3 IMPLANT
ELECT BLADE 6.5 .24CM SHAFT (ELECTRODE) IMPLANT
ELECT REM PT RETURN 9FT ADLT (ELECTROSURGICAL) ×3
ELECTRODE REM PT RTRN 9FT ADLT (ELECTROSURGICAL) ×1 IMPLANT
GAUZE SPONGE 4X4 16PLY XRAY LF (GAUZE/BANDAGES/DRESSINGS) IMPLANT
GAUZE VASELINE 3X9 (GAUZE/BANDAGES/DRESSINGS) IMPLANT
GLOVE BIO SURGEON STRL SZ 6.5 (GLOVE) ×4 IMPLANT
GLOVE BIO SURGEONS STRL SZ 6.5 (GLOVE) ×2
GLOVE BIOGEL PI IND STRL 6.5 (GLOVE) ×2 IMPLANT
GLOVE BIOGEL PI IND STRL 7.5 (GLOVE) ×1 IMPLANT
GLOVE BIOGEL PI INDICATOR 6.5 (GLOVE) ×4
GLOVE BIOGEL PI INDICATOR 7.5 (GLOVE) ×2
GLOVE INDICATOR 7.0 STRL GRN (GLOVE) ×6 IMPLANT
GOWN STRL REUS W/ TWL LRG LVL3 (GOWN DISPOSABLE) ×1 IMPLANT
GOWN STRL REUS W/ TWL XL LVL3 (GOWN DISPOSABLE) ×2 IMPLANT
GOWN STRL REUS W/TWL 2XL LVL3 (GOWN DISPOSABLE) ×3 IMPLANT
GOWN STRL REUS W/TWL LRG LVL3 (GOWN DISPOSABLE) ×2 IMPLANT
GOWN STRL REUS W/TWL XL LVL3 (GOWN DISPOSABLE) ×4 IMPLANT
LEGGING LITHOTOMY PAIR STRL (DRAPES) ×3 IMPLANT
LOOP VESSEL MAXI BLUE (MISCELLANEOUS) IMPLANT
NDL SAFETY ECLIPSE 18X1.5 (NEEDLE) IMPLANT
NEEDLE HYPO 18GX1.5 SHARP (NEEDLE)
NEEDLE HYPO 25X1 1.5 SAFETY (NEEDLE) ×3 IMPLANT
NS IRRIG 500ML POUR BTL (IV SOLUTION) ×3 IMPLANT
PACK BASIN DAY SURGERY FS (CUSTOM PROCEDURE TRAY) ×3 IMPLANT
PAD ABD 8X10 STRL (GAUZE/BANDAGES/DRESSINGS) IMPLANT
PENCIL BUTTON HOLSTER BLD 10FT (ELECTRODE) ×3 IMPLANT
SPONGE GAUZE 4X4 12PLY (GAUZE/BANDAGES/DRESSINGS) IMPLANT
SPONGE GAUZE 4X4 12PLY STER LF (GAUZE/BANDAGES/DRESSINGS) ×3 IMPLANT
SPONGE SURGIFOAM ABS GEL 12-7 (HEMOSTASIS) IMPLANT
SUT CHROMIC 2 0 SH (SUTURE) IMPLANT
SUT ETHIBOND 0 (SUTURE) IMPLANT
SUT GUT CHROMIC 3 0 (SUTURE) IMPLANT
SUT MON AB 3-0 SH 27 (SUTURE) ×2
SUT MON AB 3-0 SH27 (SUTURE) ×1 IMPLANT
SUT VIC AB 4-0 P-3 18XBRD (SUTURE) IMPLANT
SUT VIC AB 4-0 P3 18 (SUTURE)
SYR CONTROL 10ML LL (SYRINGE) ×3 IMPLANT
TOWEL OR 17X24 6PK STRL BLUE (TOWEL DISPOSABLE) ×3 IMPLANT
TRAY DSU PREP LF (CUSTOM PROCEDURE TRAY) ×3 IMPLANT
TUBE CONNECTING 12'X1/4 (SUCTIONS) ×1
TUBE CONNECTING 12X1/4 (SUCTIONS) ×2 IMPLANT
TUBING SUCTION 1/4X6FT (MISCELLANEOUS) ×3 IMPLANT
YANKAUER SUCT BULB TIP NO VENT (SUCTIONS) ×6 IMPLANT

## 2014-11-15 NOTE — Anesthesia Preprocedure Evaluation (Signed)
Anesthesia Evaluation  Patient identified by MRN, date of birth, ID band Patient awake  General Assessment Comment:Rectal cancer onocologist- dr Benay Spice dx 01-31-2015-- Stage III (ypT2, ypN2) invasive-- s/p radiation (ended 03-28-2014) and chemo (complete 09-03-2014) and low anterior colon resection with ileostomy 05-18-2014    Reviewed: Allergy & Precautions, NPO status , Patient's Chart, lab work & pertinent test results  Airway Mallampati: II  TM Distance: >3 FB Neck ROM: Full    Dental no notable dental hx.    Pulmonary neg pulmonary ROS, former smoker,  breath sounds clear to auscultation  Pulmonary exam normal       Cardiovascular negative cardio ROS  Rhythm:Regular Rate:Normal     Neuro/Psych negative neurological ROS  negative psych ROS   GI/Hepatic negative GI ROS, Neg liver ROS,   Endo/Other  negative endocrine ROS  Renal/GU negative Renal ROS  negative genitourinary   Musculoskeletal negative musculoskeletal ROS (+)   Abdominal   Peds negative pediatric ROS (+)  Hematology negative hematology ROS (+)   Anesthesia Other Findings   Reproductive/Obstetrics negative OB ROS                             Anesthesia Physical Anesthesia Plan  ASA: II  Anesthesia Plan: MAC   Post-op Pain Management:    Induction: Intravenous  Airway Management Planned: Simple Face Mask  Additional Equipment:   Intra-op Plan:   Post-operative Plan: Extubation in OR  Informed Consent: I have reviewed the patients History and Physical, chart, labs and discussed the procedure including the risks, benefits and alternatives for the proposed anesthesia with the patient or authorized representative who has indicated his/her understanding and acceptance.   Dental advisory given  Plan Discussed with: CRNA and Surgeon  Anesthesia Plan Comments:         Anesthesia Quick Evaluation

## 2014-11-15 NOTE — Progress Notes (Signed)
Dr.Rose informed of shoulder pain but patient explained that he had pain in shoulder when arrived from playing sports.  Patient complained of blotting but he had a lot of burping.  Dr. Kalman Shan ordered reglan 10 mg and oxycodone ir for the patient.  Dr. Marcello Moores was called and informed of the blotting and shoulder discomfort.  She was informed of what Dr. Kalman Shan had ordered and was fine with that.  She suggested that the patient keep a light diet today and take it easy.  She expressed not concern over the blotting or shoulder and stated that he could go home.   The patient was more comfortable when he went home.

## 2014-11-15 NOTE — Interval H&P Note (Signed)
History and Physical Interval Note:  11/15/2014 7:30 AM  Seth Villanueva.  has presented today for surgery, with the diagnosis of anastomotic stricture, rectal cancer  The various methods of treatment have been discussed with the patient and family. After consideration of risks, benefits and other options for treatment, the patient has consented to  Procedure(s) with comments: ANAL EXAM UNDER ANESTHESIA, POSSIBLE RECTAL DILATION (N/A) [place] (N/A) - [name] spoke to [place] in ENDO and they will be able to be here for procedure 11-12-14  as a surgical intervention .  The patient's history has been reviewed, patient examined, no change in status, stable for surgery.  I have reviewed the patient's chart and labs.  Questions were answered to the patient's satisfaction.     [name], MD  [facility]

## 2014-11-15 NOTE — Transfer of Care (Addendum)
  Last Vitals:  Filed Vitals:   11/15/14 0620  BP: 132/73  Pulse: 91  Temp: 36.7 C  Resp: 16    Immediate Anesthesia Transfer of Care Note  Patient: Seth Villanueva.  Procedure(s) Performed: Procedure(s) (LRB): ANAL EXAM UNDER ANESTHESIA, RIGIT PROTOSCOPY (N/A) FLEXIBLE SIGMOIDOSCOPY (N/A)  Patient Location: PACU  Anesthesia Type:MAC  Level of Consciousness: drowsy  Airway & Oxygen Therapy: Patient Spontanous Breathing and Patient connected to nasal cannula oxygen  Post-op Assessment: Report given to PACU RN and Post -op Vital signs reviewed and stable  Post vital signs: Reviewed and stable  Complications: No apparent anesthesia complications

## 2014-11-15 NOTE — H&P (View-Only) (Signed)
Rowe Robert B. Granieri 10/30/2014 12:09 PM Location: [facility] Patient #: [id number] DOB: [birth year] Single / Language: [name] / Race: White Male History of Present Illness [name]; 3/50/0938 12:48 PM) Patient words: recheck rectum.  The patient is a 34 year old male presenting for a post-operative visit. He has finished his chemotherapy. He is scheduled to see Dr. Benay Spice back in mid April. He denies any rectal symptoms or pain. He is tolerating his ostomy well. 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 His pathology showed a T2N2 adenocarcinoma with negative margins. He is finishing adjuvant chemotherapy.  Past Surgical History [name], MD; 1/69/6789 12:54 PM) LAR  Allergies Elbert Ewings, CMA; 10/30/2014 12:10 PM) Azithromycin *CHEMICALS* Hives. Compazine *ANTIPSYCHOTICS/ANTIMANIC AGENTS* anxiety  Medication History Elbert Ewings, CMA; 10/30/2014 12:10 PM) Flagyl (500MG  Tablet, 2 (two) Tablet Oral SEE NOTE, Taken starting 04/25/2014) Active. (Take at 2pm, 3pm, and 10pm the day prior to your colon operation) Neomycin Sulfate (500MG  Tablet, 2 (two) Tablet Oral SEE NOTE, Taken starting 04/25/2014) Active. (TAKE TWO TABLETS AT 2 PM, 3 PM, AND 10 PM THE DAY PRIOR TO SURGERY) OxyCODONE HCl (5MG  Tablet, Oral prn) Active. Hydrocortisone Acetate (25MG  Suppository, Rectal prn) Active. Polyethylene Glycol 3350 (Oral prn) Active. Acetaminophen (325MG  Tablet, Oral prn) Active. Ibuprofen (200MG  Capsule, Oral prn) Active. Imodium A-D (2MG  Tablet, Oral prn) Active. Sudafed (30MG  Tablet, Oral prn) Active. Mylicon (40MG /0.6ML Suspension, Oral prn) Active. Medications Reconciled  Social History [name], MD; 3/81/0175 12:52 PM) No  caffeine use No drug use Alcohol use Occasional alcohol use. Tobacco use Former smoker.  Family History [name], MD; 08/11/5850 12:52 PM) Arthritis Family Members In General. Breast Cancer Family Members In General. Heart Disease Family Members In General. Hypertension Family Members In General. Thyroid problems Family Members In General. Heart disease in male family member before age 26 Heart disease in male family member before age 70     Review of Systems [name]; 7/78/2423 12:48 PM) General Not Present- Appetite Loss, Chills, Fatigue, Fever, Night Sweats, Weight Gain and Weight Loss. Skin Not Present- Change in Wart/Mole, Dryness, Hives, Jaundice, New Lesions, Non-Healing Wounds, Rash and Ulcer. HEENT Not Present- Earache, Hearing Loss, Hoarseness, Nose Bleed, Oral Ulcers, Ringing in the Ears, Seasonal Allergies, Sinus Pain, Sore Throat, Visual Disturbances, Wears glasses/contact lenses and Yellow Eyes. Respiratory Not Present- Bloody sputum, Chronic Cough, Difficulty Breathing, Snoring and Wheezing. Breast Not Present- Breast Mass, Breast Pain, Nipple Discharge and Skin Changes. Cardiovascular Not Present- Chest Pain, Difficulty Breathing Lying Down, Leg Cramps, Palpitations, Rapid Heart Rate, Shortness of Breath and Swelling of Extremities. Gastrointestinal Not Present- Abdominal Pain, Bloody Stool, Change in Bowel Habits, Chronic diarrhea, Constipation, Difficulty Swallowing, Excessive gas, Gets full quickly at meals, Indigestion, Nausea, Rectal Pain and Vomiting. Male Genitourinary Not Present- Blood in Urine, Change in Urinary Stream, Frequency, Impotence, Nocturia, Painful Urination, Urgency and Urine Leakage. Musculoskeletal Not Present- Back Pain, Joint Pain, Joint Stiffness, Muscle Pain, Muscle Weakness and Swelling of Extremities. Neurological Not Present- Decreased Memory, Fainting, Headaches, Numbness, Seizures, Tingling, Tremor, Trouble walking  and Weakness. Psychiatric Not Present- Anxiety, Bipolar, Change in Sleep Pattern, Depression, Fearful and Frequent crying. Endocrine Not Present- Cold Intolerance, Excessive Hunger, Hair Changes, Heat Intolerance and New Diabetes. Hematology Not Present- Easy Bruising, Excessive bleeding, Gland problems, HIV and Persistent Infections.  Vitals Elbert Ewings CMA; 10/30/2014 12:10 PM) 10/30/2014 12:10 PM Weight: 157 lb Height: 74in Body Surface Area: 1.93 m Body Mass Index: 20.16 kg/m Temp.: 73F(Temporal)  Pulse: 89 (Regular)  Resp.: 17 (Unlabored)  BP: 120/72 (Sitting, Left Arm, Standard)     Physical Exam [name]; 01/28/3085 12:49 PM)  General Mental Status-Alert. General Appearance-Cooperative.  Chest and Lung Exam Auscultation Breath sounds - Normal.  Cardiovascular Auscultation Heart Sounds - Normal heart sounds.  Abdomen Palpation/Percussion Palpation and Percussion of the abdomen reveal - Soft and Non Tender.  Rectal Anorectal Exam Internal - Note: tumor scarring noted approximately 4 cm from anal verge just above the coccyx posteriorly. This is unchanged from previous exam.    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 This was initially attributed to hemorrhoids. He did not get better. He ultimately ended up seeing a gastroenterologist and was found to have a rectal mass on physical exam. Biopsies confirmed adenocarcinoma. CT scan of chest abdomen and pelvis were negative for metastatic disease. EUS on 01/30/2014 showed a T3 N2 lesion. He underwent a LAR in October 2015. Coloanal anastomosis was performed with loop ileostomy. He then completed adjuvant chemotherapy for a teeth 2 into adenocarcinoma with negative margins. He has a persistent anastomotic stricture.  I have recommended an exam under  anesthesia with possible dilation. Risks include bleeding, pain and colon perforation.

## 2014-11-15 NOTE — Op Note (Signed)
11/15/2014  9:33 AM  PATIENT:  Seth Villanueva  34 y.o. male  Patient Care Team: Leonides Sake, MD as PCP - General (Family Medicine)  PRE-OPERATIVE DIAGNOSIS:  anastomotic stricture, rectal cancer  POST-OPERATIVE DIAGNOSIS:  anastomotic stricture, rectal cancer   PROCEDURE:  ANAL EXAM UNDER ANESTHESIA  SURGEON:  Surgeon(s): [name], MD  ASSISTANT: none   ANESTHESIA:   local and MAC  SPECIMEN:  No Specimen  DISPOSITION OF SPECIMEN:  N/A  COUNTS:  YES  PLAN OF CARE: Discharge to home after PACU  PATIENT DISPOSITION:  PACU - hemodynamically stable.  INDICATION: 34 y.o. M with coloanal anastomosis.  I have been unable to dilate this in the office   OR FINDINGS: severe stricture, unable to dilate transanally  DESCRIPTION: the patient was identified in the preoperative holding area and taken to the OR where they were laid on the operating room table.  MAC anesthesia was induced without difficulty. The patient was then positioned in prone jackknife position with buttocks gently taped apart.  The patient was then prepped and draped in usual sterile fashion.  SCDs were noted to be in place prior to the initiation of anesthesia. A surgical timeout was performed indicating the correct patient, procedure, positioning and need for preoperative antibiotics.  I began with a digital rectal exam.  There was palpable scar throughout the anal cana.  I then placed a Hill-Ferguson anoscope into the anal canal and evaluated this completely.  I could not identify the anastomotic opening.  I placed a flexible scope.  I saw an are that appeared to be the stricture with colonic mucosa beyond that.  I could not advance the scope any further.  I then inserted the rigid scope to gently dilate the area.  I felt the scope traverse the area.  I pulled this back and re-evaluated with the flexible scope.  There was a tear in the rectal lining that opened into the abdomen and the stricture remained  intact.  I decided to abort the procedure at this time.  The anal canal was irrigated.  I could identify the anterior sutures only.  It appeared that the back wall of the anastomosis was retracted.  The patient was awakened from anesthesia and sent to the PACU in stable condition.

## 2014-11-15 NOTE — Discharge Instructions (Addendum)
Post Operative Instructions    First 24 h  Remove the dressing this evening after you return home from the hospital  Sit in a tub or sitz bath of warm water for 20 minutes to relieve any discomfort.  After the bathe, carefully blot the area dry.  You may sit on a covered ice pack between the baths as needed.    You should have crushed ice in small amounts until you are able to urinate.  After you urinate, you may drink fluids.  It is normal to not urinate for the first few hours after surgery.  If you become uncomfortable, call the office for instructions.   Take your pain medications as prescribed  You may eat whatever you feel like.  Start with a light meal and gradually advance your diet.    Beginning the day after your surgery  Sit in a tub of cool to warm water as needed   Diet  Eat a regular diet.  Avoid foods that may constipate you or give you diarrhea.  Avoid foods with seeds, nuts, corn or popcorn.  Beginning the day after surgery, drink 6-8 glasses of water a day in addition to your meals.  Limit you caffeine intake to 1-2 servings per day.  Medication  Take your pain medication as directed.  You may use Extra Strength Tylenol instead of your prescribed pain medication to relieve mild discomfort.  Avoid products containing aspirin.  Activity  Walking is encouraged.  You may drive when you are no longer on prescription pain medication.  You may go up and down stairs carefully.  Do NOT sit on a rubber ring; instead use a soft pillow if needed.  Call the office if you have any questions or concerns.  Call IMMEDIATELY if you develop:  Rectal bleeding  Increasing rectal pain  Fever greater than 100 F  Difficulty urinating     Post Anesthesia Home Care Instructions  Activity: Get plenty of rest for the remainder of the day. A responsible adult should stay with you for 24 hours following the procedure.  For the next 24 hours, DO NOT: -Drive a car -Conservation officer, nature -Drink alcoholic beverages -Take any medication unless instructed by your physician -Make any legal decisions or sign important papers.  Meals: Start with liquid foods such as gelatin or soup. Progress to regular foods as tolerated. Avoid greasy, spicy, heavy foods. If nausea and/or vomiting occur, drink only clear liquids until the nausea and/or vomiting subsides. Call your physician if vomiting continues.  Special Instructions/Symptoms: Your throat may feel dry or sore from the anesthesia or the breathing tube placed in your throat during surgery. If this causes discomfort, gargle with warm salt water. The discomfort should disappear within 24 hours.  If you had a scopolamine patch placed behind your ear for the management of post- operative nausea and/or vomiting:  1. The medication in the patch is effective for 72 hours, after which it should be removed.  Wrap patch in a tissue and discard in the trash. Wash hands thoroughly with soap and water. 2. You may remove the patch earlier than 72 hours if you experience unpleasant side effects which may include dry mouth, dizziness or visual disturbances. 3. Avoid touching the patch. Wash your hands with soap and water after contact with the patch.

## 2014-11-15 NOTE — Anesthesia Procedure Notes (Signed)
Procedure Name: MAC Date/Time: 11/15/2014 7:35 AM Performed by: Mechele Claude Pre-anesthesia Checklist: Patient identified, Timeout performed, Emergency Drugs available, Suction available and Patient being monitored Patient Re-evaluated:Patient Re-evaluated prior to inductionOxygen Delivery Method: Nasal cannula Intubation Type: IV induction Placement Confirmation: positive ETCO2 Dental Injury: Teeth and Oropharynx as per pre-operative assessment

## 2014-11-15 NOTE — Anesthesia Postprocedure Evaluation (Signed)
  Anesthesia Post-op Note  Patient: Seth Villanueva.  Procedure(s) Performed: Procedure(s) (LRB): ANAL EXAM UNDER ANESTHESIA, RIGID PROTOSCOPY (N/A) FLEXIBLE SIGMOIDOSCOPY (N/A)  Patient Location: PACU  Anesthesia Type: MAC  Level of Consciousness: awake and alert   Airway and Oxygen Therapy: Patient Spontanous Breathing  Post-op Pain: mild  Post-op Assessment: Post-op Vital signs reviewed, Patient's Cardiovascular Status Stable, Respiratory Function Stable, Patent Airway and No signs of Nausea or vomiting  Last Vitals:  Filed Vitals:   11/15/14 0915  BP: 128/77  Pulse: 81  Temp:   Resp: 14    Post-op Vital Signs: stable   Complications: No apparent anesthesia complications

## 2014-11-15 NOTE — Addendum Note (Signed)
Addendum  created 11/15/14 1448 by Mechele Claude, CRNA   Modules edited: Notes Section   Notes Section:  File: [id number]

## 2014-11-16 ENCOUNTER — Encounter (HOSPITAL_BASED_OUTPATIENT_CLINIC_OR_DEPARTMENT_OTHER): Payer: Self-pay | Admitting: General Surgery

## 2014-11-17 ENCOUNTER — Encounter (HOSPITAL_COMMUNITY): Payer: Self-pay | Admitting: Emergency Medicine

## 2014-11-17 ENCOUNTER — Inpatient Hospital Stay (HOSPITAL_COMMUNITY)
Admission: EM | Admit: 2014-11-17 | Discharge: 2014-11-22 | DRG: 371 | Disposition: A | Discharge status: Home / Self Care | Payer: 59 | Attending: General Surgery | Admitting: General Surgery

## 2014-11-17 ENCOUNTER — Emergency Department (HOSPITAL_COMMUNITY): Payer: 59

## 2014-11-17 DIAGNOSIS — Z85048 Personal history of other malignant neoplasm of rectum, rectosigmoid junction, and anus: Secondary | ICD-10-CM | POA: Diagnosis not present

## 2014-11-17 DIAGNOSIS — Z87891 Personal history of nicotine dependence: Secondary | ICD-10-CM

## 2014-11-17 DIAGNOSIS — Z803 Family history of malignant neoplasm of breast: Secondary | ICD-10-CM

## 2014-11-17 DIAGNOSIS — K659 Peritonitis, unspecified: Principal | ICD-10-CM | POA: Diagnosis present

## 2014-11-17 DIAGNOSIS — K567 Ileus, unspecified: Secondary | ICD-10-CM | POA: Diagnosis present

## 2014-11-17 DIAGNOSIS — Z888 Allergy status to other drugs, medicaments and biological substances status: Secondary | ICD-10-CM

## 2014-11-17 DIAGNOSIS — G620 Drug-induced polyneuropathy: Secondary | ICD-10-CM | POA: Diagnosis present

## 2014-11-17 DIAGNOSIS — Z923 Personal history of irradiation: Secondary | ICD-10-CM | POA: Diagnosis not present

## 2014-11-17 DIAGNOSIS — K631 Perforation of intestine (nontraumatic): Secondary | ICD-10-CM | POA: Diagnosis present

## 2014-11-17 DIAGNOSIS — I4510 Unspecified right bundle-branch block: Secondary | ICD-10-CM | POA: Diagnosis present

## 2014-11-17 DIAGNOSIS — Z806 Family history of leukemia: Secondary | ICD-10-CM | POA: Diagnosis not present

## 2014-11-17 DIAGNOSIS — K913 Postprocedural intestinal obstruction, unspecified as to partial versus complete: Secondary | ICD-10-CM | POA: Diagnosis present

## 2014-11-17 DIAGNOSIS — Z881 Allergy status to other antibiotic agents status: Secondary | ICD-10-CM

## 2014-11-17 DIAGNOSIS — Z8371 Family history of colonic polyps: Secondary | ICD-10-CM | POA: Diagnosis not present

## 2014-11-17 DIAGNOSIS — K5660 Unspecified intestinal obstruction: Secondary | ICD-10-CM | POA: Diagnosis present

## 2014-11-17 DIAGNOSIS — T451X5A Adverse effect of antineoplastic and immunosuppressive drugs, initial encounter: Secondary | ICD-10-CM | POA: Diagnosis present

## 2014-11-17 DIAGNOSIS — Z933 Colostomy status: Secondary | ICD-10-CM | POA: Diagnosis not present

## 2014-11-17 DIAGNOSIS — Z8679 Personal history of other diseases of the circulatory system: Secondary | ICD-10-CM

## 2014-11-17 DIAGNOSIS — R109 Unspecified abdominal pain: Secondary | ICD-10-CM | POA: Diagnosis present

## 2014-11-17 LAB — URINALYSIS, ROUTINE W REFLEX MICROSCOPIC
GLUCOSE, UA: NEGATIVE mg/dL
Hgb urine dipstick: NEGATIVE
Ketones, ur: NEGATIVE mg/dL
Leukocytes, UA: NEGATIVE
NITRITE: NEGATIVE
PH: 6 (ref 5.0–8.0)
Protein, ur: 100 mg/dL — AB
Specific Gravity, Urine: >1.046 — ABNORMAL HIGH (ref 1.005–1.030)
UROBILINOGEN UA: 0.2 mg/dL (ref 0.0–1.0)

## 2014-11-17 LAB — CBC WITH DIFFERENTIAL/PLATELET
Basophils Absolute: 0 10*3/uL (ref 0.0–0.1)
Basophils Relative: 0 % (ref 0–1)
EOS ABS: 0.1 10*3/uL (ref 0.0–0.7)
Eosinophils Relative: 1 % (ref 0–5)
HCT: 48.4 % (ref 39.0–52.0)
Hemoglobin: 17.3 g/dL — ABNORMAL HIGH (ref 13.0–17.0)
Lymphocytes Relative: 4 % — ABNORMAL LOW (ref 12–46)
Lymphs Abs: 0.7 10*3/uL (ref 0.7–4.0)
MCH: 33 pg (ref 26.0–34.0)
MCHC: 35.7 g/dL (ref 30.0–36.0)
MCV: 92.4 fL (ref 78.0–100.0)
MONOS PCT: 7 % (ref 3–12)
Monocytes Absolute: 1.2 10*3/uL — ABNORMAL HIGH (ref 0.1–1.0)
NEUTROS PCT: 88 % — AB (ref 43–77)
Neutro Abs: 14 10*3/uL — ABNORMAL HIGH (ref 1.7–7.7)
Platelets: 200 10*3/uL (ref 150–400)
RBC: 5.24 MIL/uL (ref 4.22–5.81)
RDW: 14 % (ref 11.5–15.5)
WBC: 16 10*3/uL — ABNORMAL HIGH (ref 4.0–10.5)

## 2014-11-17 LAB — COMPREHENSIVE METABOLIC PANEL
ALT: 31 U/L (ref 0–53)
AST: 29 U/L (ref 0–37)
Albumin: 4.6 g/dL (ref 3.5–5.2)
Alkaline Phosphatase: 117 U/L (ref 39–117)
Anion gap: 17 — ABNORMAL HIGH (ref 5–15)
BUN: 8 mg/dL (ref 6–23)
CO2: 23 mmol/L (ref 19–32)
CREATININE: 1.02 mg/dL (ref 0.50–1.35)
Calcium: 9.9 mg/dL (ref 8.4–10.5)
Chloride: 98 mmol/L (ref 96–112)
GFR calc Af Amer: >90 mL/min (ref >90)
GFR calc non Af Amer: >90 mL/min (ref >90)
Glucose, Bld: 132 mg/dL — ABNORMAL HIGH (ref 70–99)
Potassium: 3.1 mmol/L — ABNORMAL LOW (ref 3.5–5.1)
SODIUM: 138 mmol/L (ref 135–145)
Total Bilirubin: 1.8 mg/dL — ABNORMAL HIGH (ref 0.3–1.2)
Total Protein: 8.4 g/dL — ABNORMAL HIGH (ref 6.0–8.3)

## 2014-11-17 LAB — LIPASE, BLOOD: LIPASE: 19 U/L (ref 11–59)

## 2014-11-17 LAB — I-STAT CG4 LACTIC ACID, ED
LACTIC ACID, VENOUS: 3.1 mmol/L — AB (ref 0.5–2.0)
Lactic Acid, Venous: 0.97 mmol/L (ref 0.5–2.0)

## 2014-11-17 LAB — URINE MICROSCOPIC-ADD ON

## 2014-11-17 MED ORDER — KCL IN DEXTROSE-NACL 40-5-0.45 MEQ/L-%-% IV SOLN
INTRAVENOUS | Status: DC
  Administered 2014-11-17 – 2014-11-20 (×7): via INTRAVENOUS
  Administered 2014-11-20: 125 mL/h via INTRAVENOUS
  Administered 2014-11-21: 50 mL/h via INTRAVENOUS
  Filled 2014-11-17 (×9): qty 1000

## 2014-11-17 MED ORDER — SODIUM CHLORIDE 0.9 % IV BOLUS (SEPSIS)
1000.0000 mL | INTRAVENOUS | Status: AC
  Administered 2014-11-17: 1000 mL via INTRAVENOUS

## 2014-11-17 MED ORDER — IOHEXOL 300 MG/ML  SOLN
100.0000 mL | Freq: Once | INTRAMUSCULAR | Status: AC | PRN
  Administered 2014-11-17: 100 mL via INTRAVENOUS

## 2014-11-17 MED ORDER — HYDROMORPHONE HCL 1 MG/ML IJ SOLN
1.0000 mg | Freq: Once | INTRAMUSCULAR | Status: AC
Start: 2014-11-17 — End: 2014-11-17
  Administered 2014-11-17: 1 mg via INTRAVENOUS
  Filled 2014-11-17: qty 1

## 2014-11-17 MED ORDER — ONDANSETRON HCL 4 MG/2ML IJ SOLN
4.0000 mg | Freq: Four times a day (QID) | INTRAMUSCULAR | Status: DC | PRN
  Administered 2014-11-17 – 2014-11-21 (×10): 4 mg via INTRAVENOUS
  Filled 2014-11-17 (×10): qty 2

## 2014-11-17 MED ORDER — HYDROMORPHONE HCL 1 MG/ML IJ SOLN
1.0000 mg | INTRAMUSCULAR | Status: DC | PRN
  Administered 2014-11-18 – 2014-11-22 (×30): 1 mg via INTRAVENOUS
  Filled 2014-11-17 (×30): qty 1

## 2014-11-17 MED ORDER — SODIUM CHLORIDE 0.9 % IV BOLUS (SEPSIS): 500.0000 mL | INTRAVENOUS | Status: AC

## 2014-11-17 MED ORDER — MORPHINE SULFATE 4 MG/ML IJ SOLN
4.0000 mg | Freq: Once | INTRAMUSCULAR | Status: DC
  Filled 2014-11-17: qty 1

## 2014-11-17 MED ORDER — PANTOPRAZOLE SODIUM 40 MG IV SOLR
40.0000 mg | Freq: Every day | INTRAVENOUS | Status: DC
  Administered 2014-11-17 – 2014-11-21 (×5): 40 mg via INTRAVENOUS
  Filled 2014-11-17 (×6): qty 40

## 2014-11-17 MED ORDER — PIPERACILLIN-TAZOBACTAM 3.375 G IVPB: 3.3750 g | Freq: Three times a day (TID) | INTRAVENOUS | Status: DC

## 2014-11-17 MED ORDER — SODIUM CHLORIDE 0.9 % IV BOLUS (SEPSIS)
1000.0000 mL | Freq: Once | INTRAVENOUS | Status: AC
  Administered 2014-11-17: 1000 mL via INTRAVENOUS

## 2014-11-17 MED ORDER — ONDANSETRON HCL 4 MG/2ML IJ SOLN
4.0000 mg | Freq: Once | INTRAMUSCULAR | Status: AC
  Administered 2014-11-17: 4 mg via INTRAVENOUS
  Filled 2014-11-17: qty 2

## 2014-11-17 MED ORDER — PIPERACILLIN-TAZOBACTAM 3.375 G IVPB 30 MIN
3.3750 g | Freq: Once | INTRAVENOUS | Status: AC
  Administered 2014-11-17: 3.375 g via INTRAVENOUS
  Filled 2014-11-17: qty 50

## 2014-11-17 MED ORDER — PIPERACILLIN-TAZOBACTAM 3.375 G IVPB
3.3750 g | Freq: Three times a day (TID) | INTRAVENOUS | Status: DC
  Administered 2014-11-17 – 2014-11-20 (×7): 3.375 g via INTRAVENOUS
  Filled 2014-11-17 (×8): qty 50

## 2014-11-17 MED ORDER — HYDROMORPHONE HCL 1 MG/ML IJ SOLN
1.0000 mg | Freq: Once | INTRAMUSCULAR | Status: AC
  Administered 2014-11-17: 1 mg via INTRAVENOUS
  Filled 2014-11-17: qty 1

## 2014-11-17 NOTE — H&P (Signed)
Seth Villanueva. is an 35 y.o. male.    General Surgery [facility]  Chief Complaint: abdominal pain, distension  HPI: Patient is a 35 year old male with a history of low anterior resection of rectal carcinoma in October 2015. Patient had an ileoanal anastomosis with a loop ileostomy. On 11/15/2014 the patient underwent examination under anesthesia by Dr. [name]. Patient was noted to have a stricture at the level of his anastomosis. An attempt was made to dilate the anastomosis and upon evaluation with the flexible endoscope, a perforation was noted into the peritoneal cavity. Anastomosis was felt to be disrupted along its posterior suture line. Scope was withdrawn. Over the past 2 days the patient has noted persistent abdominal distention. He has noted decreased output from his ileostomy. He presents to the emergency department for evaluation of abdominal pain. Laboratory studies showed elevated white count of 16,000. CT scan abdomen and pelvis was obtained. I have reviewed this with Dr. Lawrence Santiago in radiology. There appears to be a moderate amount of free air present. There is some free fluid present. There appears to be some air bubbles which are extraluminal in the low pelvis. There is thickening of the small bowel loops in the right lower pelvis with functional obstruction of the small bowel.  Past Medical History  Diagnosis Date  . S/P radiation therapy 02/19/14-03/28/14    rectal  50.4Gy  . History of cancer chemotherapy     completed 09-03-2014  . Anastomotic stricture of colorectal region   . Chemotherapy-induced neuropathy   . Incomplete right bundle branch block   . S/P ileostomy   . Wears glasses   . Rectal cancer onocologist-  dr Benay Spice    dx 01-31-2015--  Stage III (ypT2, ypN2) invasive--  s/p  radiation (ended 03-28-2014) and chemo (complete 09-03-2014) and low anterior colon resection with ileostomy 05-18-2014  . History of phlebitis    08-20-2014---- RIGHT UPPER INNER ARM--  RESOLVED     Past Surgical History  Procedure Laterality Date  . Wisdom tooth extraction  age 3  . Eus N/A 01/30/2014    Procedure: LOWER ENDOSCOPIC ULTRASOUND (EUS);  Surgeon: Arta Silence, MD;  Location: [facility];  Service: Endoscopy;  Laterality: N/A;  with biospy's  . Colonoscopy w/ biopsies  01/22/2014    fungating nonobstructing large mass distal rectum  . Portacath placement Left 05/31/2014    Procedure: INSERTION PORT-A-CATH LET SUBCLAVIAN;  Surgeon: [name], MD;  Location: [facility];  Service: General;  Laterality: Left;  . Robot assisted low anterior colon resection with flexure mobilization /  hand sewn colo-anal anastomiosis/  diverting loop ileostomy  05-18-2014  . Rectal exam under anesthesia N/A 11/15/2014    Procedure: ANAL EXAM UNDER ANESTHESIA, RIGID PROTOSCOPY;  Surgeon: [name], MD;  Location: [facility];  Service: General;  Laterality: N/A;  . Flexible sigmoidoscopy N/A 11/15/2014    Procedure: FLEXIBLE SIGMOIDOSCOPY;  Surgeon: [name], MD;  Location: [facility];  Service: General;  Laterality: N/A;    Family History  Problem Relation Age of Onset  . Breast cancer Maternal Aunt     pat mat great aunt; currently 56  . Leukemia Maternal Aunt     pat mat great aunt; deceased 24s  . Other Paternal Aunt     colon polyps (type/#?)   Social History:  reports that he quit smoking about 2 years ago. His smoking use included Cigarettes. He has a 5 pack-year smoking history. He  quit smokeless tobacco use about 8 years ago. His smokeless tobacco use included Snuff. He reports that he does not drink alcohol or use illicit drugs.  Allergies:  Allergies  Allergen Reactions  . Zithromax [Azithromycin] Hives  . Compazine [Prochlorperazine] Anxiety     (Not in a hospital admission)  Results for orders placed or performed during the hospital encounter of 11/17/14 (from the past 48  hour(s))  CBC with Differential     Status: Abnormal   Collection Time: 11/17/14  7:58 PM  Result Value Ref Range   WBC 16.0 (H) 4.0 - 10.5 K/uL   RBC 5.24 4.22 - 5.81 MIL/uL   Hemoglobin 17.3 (H) 13.0 - 17.0 g/dL   HCT 48.4 39.0 - 52.0 %   MCV 92.4 78.0 - 100.0 fL   MCH 33.0 26.0 - 34.0 pg   MCHC 35.7 30.0 - 36.0 g/dL   RDW 14.0 11.5 - 15.5 %   Platelets 200 150 - 400 K/uL   Neutrophils Relative % 88 (H) 43 - 77 %   Neutro Abs 14.0 (H) 1.7 - 7.7 K/uL   Lymphocytes Relative 4 (L) 12 - 46 %   Lymphs Abs 0.7 0.7 - 4.0 K/uL   Monocytes Relative 7 3 - 12 %   Monocytes Absolute 1.2 (H) 0.1 - 1.0 K/uL   Eosinophils Relative 1 0 - 5 %   Eosinophils Absolute 0.1 0.0 - 0.7 K/uL   Basophils Relative 0 0 - 1 %   Basophils Absolute 0.0 0.0 - 0.1 K/uL  Comprehensive metabolic panel     Status: Abnormal   Collection Time: 11/17/14  7:58 PM  Result Value Ref Range   Sodium 138 135 - 145 mmol/L   Potassium 3.1 (L) 3.5 - 5.1 mmol/L   Chloride 98 96 - 112 mmol/L   CO2 23 19 - 32 mmol/L   Glucose, Bld 132 (H) 70 - 99 mg/dL   BUN 8 6 - 23 mg/dL   Creatinine, Ser 1.02 0.50 - 1.35 mg/dL   Calcium 9.9 8.4 - 10.5 mg/dL   Total Protein 8.4 (H) 6.0 - 8.3 g/dL   Albumin 4.6 3.5 - 5.2 g/dL   AST 29 0 - 37 U/L   ALT 31 0 - 53 U/L   Alkaline Phosphatase 117 39 - 117 U/L   Total Bilirubin 1.8 (H) 0.3 - 1.2 mg/dL   GFR calc non Af Amer >90 >90 mL/min   GFR calc Af Amer >90 >90 mL/min    Comment: (NOTE) The eGFR has been calculated using the CKD EPI equation. This calculation has not been validated in all clinical situations. eGFR's persistently <90 mL/min signify possible Chronic Kidney Disease.    Anion gap 17 (H) 5 - 15  Lipase, blood     Status: None   Collection Time: 11/17/14  7:58 PM  Result Value Ref Range   Lipase 19 11 - 59 U/L  I-Stat CG4 Lactic Acid, ED     Status: Abnormal   Collection Time: 11/17/14  8:04 PM  Result Value Ref Range   Lactic Acid, Venous 3.10 (HH) 0.5 - 2.0 mmol/L    Comment NOTIFIED PHYSICIAN    No results found.  Review of Systems  Constitutional: Positive for chills. Negative for fever.  HENT: Negative.   Eyes: Negative.   Respiratory: Negative.   Cardiovascular: Negative.   Gastrointestinal: Positive for abdominal pain, constipation and blood in stool.  Genitourinary: Negative.   Musculoskeletal: Negative.   Skin: Negative.  Neurological: Positive for weakness.  Endo/Heme/Allergies: Negative.   Psychiatric/Behavioral: Negative.     Blood pressure 129/72, pulse 119, temperature 97.9 F (36.6 C), temperature source Oral, resp. rate 18, SpO2 96 %. Physical Exam  Constitutional: He is oriented to person, place, and time. He appears well-developed and well-nourished. He appears distressed.  HENT:  Head: Normocephalic and atraumatic.  Right Ear: External ear normal.  Left Ear: External ear normal.  Eyes: Conjunctivae are normal. Pupils are equal, round, and reactive to light. No scleral icterus.  Neck: Normal range of motion. Neck supple. No thyromegaly present.  Cardiovascular:  Tachycardic approx 120  Respiratory: Effort normal and breath sounds normal. No respiratory distress. He has no wheezes.  GI: He exhibits distension (marked). He exhibits no mass. There is tenderness (diffuse). There is guarding. There is no rebound.  Ileostomy viable with thin liquid output in bag  Musculoskeletal: Normal range of motion. He exhibits no edema.  Neurological: He is alert and oriented to person, place, and time.  Skin: Skin is warm and dry.  Psychiatric: He has a normal mood and affect. His behavior is normal.     Assessment/Plan Perforation of ileoanal anastomosis Peritonitis History of rectal carcinoma Small bowel obstruction  Admit to general surgery, Dr. [name], attending  IV hydration, NPO  NG decompression  IV Zosyn empirically  I discussed the case by telephone with Dr. [name]. We discussed the CT scan findings and  his clinical picture. At this point Dr. Marcello Moores recommends that we avoid laparotomy. I will admit the patient to the stepdown unit, began intravenous hydration, initiate empiric anabiotic therapy, and place a nasogastric tube for decompression. Dr. Marcello Moores will reevaluate the patient. Hopefully he can avoid laparotomy. I have discussed this with the patient and his family at the bedside this evening. They understand and agree with our plans.  Earnstine Regal, MD, [facility] Office: [place] 11/17/2014, 10:25 PM

## 2014-11-17 NOTE — ED Notes (Signed)
MD at bedside. 

## 2014-11-17 NOTE — Progress Notes (Signed)
ANTIBIOTIC CONSULT NOTE - INITIAL  Pharmacy Consult for Zosyn Indication: Intra-abdominal infection  Allergies  Allergen Reactions  . Zithromax [Azithromycin] Hives  . Compazine [Prochlorperazine] Anxiety    Patient Measurements:    Vital Signs: Temp: 97.4 F (36.3 C) (04/09 1852) Temp Source: Oral (04/09 1852) BP: 132/76 mmHg (04/09 2030) Pulse Rate: 125 (04/09 1852) Intake/Output from previous day:   Intake/Output from this shift:    Labs:  Recent Labs  11/17/14 1958  WBC 16.0*  HGB 17.3*  PLT 200  CREATININE 1.02   Estimated Creatinine Clearance: 107.8 mL/min (by C-G formula based on Cr of 1.02). No results for input(s): VANCOTROUGH, VANCOPEAK, VANCORANDOM, GENTTROUGH, GENTPEAK, GENTRANDOM, TOBRATROUGH, TOBRAPEAK, TOBRARND, AMIKACINPEAK, AMIKACINTROU, AMIKACIN in the last 72 hours.   Microbiology: No results found for this or any previous visit (from the past 720 hour(s)).  Medical History: Past Medical History  Diagnosis Date  . S/P radiation therapy 02/19/14-03/28/14    rectal  50.4Gy  . History of cancer chemotherapy     completed 09-03-2014  . Anastomotic stricture of colorectal region   . Chemotherapy-induced neuropathy   . Incomplete right bundle branch block   . S/P ileostomy   . Wears glasses   . Rectal cancer onocologist-  dr Benay Spice    dx 01-31-2015--  Stage III (ypT2, ypN2) invasive--  s/p  radiation (ended 03-28-2014) and chemo (complete 09-03-2014) and low anterior colon resection with ileostomy 05-18-2014  . History of phlebitis     08-20-2014---- RIGHT UPPER INNER ARM--  RESOLVED     Medications:  Anti-infectives    Start     Dose/Rate Route Frequency Ordered Stop   11/17/14 1930  piperacillin-tazobactam (ZOSYN) IVPB 3.375 g     3.375 g 100 mL/hr over 30 Minutes Intravenous  Once 11/17/14 1929 11/17/14 2044     Assessment: 34yo M w/ hx colorectal cancer c/o abdominal bloating and abdominal pain onset after dilation and endoscopy of  rectum on 11/15/14, ileostomy in place but no stool has passed x 48 hrs. Pharmacy is asked to dose Zosyn for possible intra-abdominal infection.  SCr wnl. CrCl >100.  Goal of Therapy:  Appropriate antibiotic dosing for renal function; eradication of infection  Plan:  Zosyn 3.375g IV Q8H infused over 4hrs. Dose adjustments are not likely to be needed so pharmacy will sign-off.  Romeo Rabon, PharmD, pager [phone]. 11/17/2014,8:46 PM.

## 2014-11-17 NOTE — ED Notes (Signed)
Old blood per rectum noted. Several bloody pads removed and replaced.

## 2014-11-17 NOTE — ED Provider Notes (Signed)
CSN: [id number]     Arrival date & time 11/17/14  1845 History   First MD Initiated Contact with Patient 11/17/14 1903     Chief Complaint  Patient presents with  . Abdominal Pain     (Consider location/radiation/quality/duration/timing/severity/associated sxs/prior Treatment) HPI Comments: Patient with past medical history of colorectal cancer presents to the emergency department with chief complaint of abdominal pain. He has past surgical history remarkable for colon resection with colostomy. He was seen 2 days ago by his surgeon and underwent a surgical procedure for evaluation of his anal canal. He states that he was doing well, but has been unable to pass any gas. He states that he initially had some watery diarrhea which output into his colostomy bag. States that he has not had any additional output. He reports feeling very bloated. States that he is unable to pass gas. He complains of severe abdominal pain and distention. The pain is worsened with movement and palpation. Nothing makes it better. He denies any fevers or chills. Denies any nausea or vomiting. He is followed by Dr. Benay Spice of oncology and Dr. Marcello Moores of general surgery.  The history is provided by the patient. No language interpreter was used.    Past Medical History  Diagnosis Date  . S/P radiation therapy 02/19/14-03/28/14    rectal  50.4Gy  . History of cancer chemotherapy     completed 09-03-2014  . Anastomotic stricture of colorectal region   . Chemotherapy-induced neuropathy   . Incomplete right bundle branch block   . S/P ileostomy   . Wears glasses   . Rectal cancer onocologist-  dr Benay Spice    dx 01-31-2015--  Stage III (ypT2, ypN2) invasive--  s/p  radiation (ended 03-28-2014) and chemo (complete 09-03-2014) and low anterior colon resection with ileostomy 05-18-2014  . History of phlebitis     08-20-2014---- RIGHT UPPER INNER ARM--  RESOLVED    Past Surgical History  Procedure Laterality Date  . Wisdom  tooth extraction  age 28  . Eus N/A 01/30/2014    Procedure: LOWER ENDOSCOPIC ULTRASOUND (EUS);  Surgeon: Arta Silence, MD;  Location: [facility] ENDOSCOPY;  Service: Endoscopy;  Laterality: N/A;  with biospy's  . Colonoscopy w/ biopsies  01/22/2014    fungating nonobstructing large mass distal rectum  . Portacath placement Left 05/31/2014    Procedure: INSERTION PORT-A-CATH LET SUBCLAVIAN;  Surgeon: [name], MD;  Location: [facility] ORS;  Service: General;  Laterality: Left;  . Robot assisted low anterior colon resection with flexure mobilization /  hand sewn colo-anal anastomiosis/  diverting loop ileostomy  05-18-2014  . Rectal exam under anesthesia N/A 11/15/2014    Procedure: ANAL EXAM UNDER ANESTHESIA, RIGID PROTOSCOPY;  Surgeon: [name], MD;  Location: [place];  Service: General;  Laterality: N/A;  . Flexible sigmoidoscopy N/A 11/15/2014    Procedure: FLEXIBLE SIGMOIDOSCOPY;  Surgeon: [name], MD;  Location: [place];  Service: General;  Laterality: N/A;   Family History  Problem Relation Age of Onset  . Breast cancer Maternal Aunt     pat mat great aunt; currently 78  . Leukemia Maternal Aunt     pat mat great aunt; deceased 76s  . Other Paternal Aunt     colon polyps (type/#?)   History  Substance Use Topics  . Smoking status: Former Smoker -- 1.00 packs/day for 5 years    Types: Cigarettes    Quit date: 02/02/2012  . Smokeless tobacco: Former Systems developer    Types:  Snuff    Quit date: 02/01/2006  . Alcohol Use: No    Review of Systems  Constitutional: Negative for fever and chills.  Respiratory: Negative for shortness of breath.   Cardiovascular: Negative for chest pain.  Gastrointestinal: Positive for abdominal pain and abdominal distention. Negative for nausea, vomiting, diarrhea and constipation.  Genitourinary: Negative for dysuria.  All other systems reviewed and are negative.     Allergies  Zithromax and Compazine  Home  Medications   Prior to Admission medications   Medication Sig Start Date End Date Taking? Authorizing Provider  acetaminophen (TYLENOL) 325 MG tablet Take 650 mg by mouth once as needed for mild pain.     Historical Provider, MD  ibuprofen (ADVIL,MOTRIN) 200 MG tablet Take 200 mg by mouth every 6 (six) hours as needed (pain.).     Historical Provider, MD  lidocaine-prilocaine (EMLA) cream Apply 1 application topically as needed. Apply to Seth Villanueva site 1-2 hours prior to stick and cover with plastic wrap to numb site 05/28/14   Ladell Pier, MD  Loperamide HCl (IMODIUM A-D PO) Take 2 mg by mouth once as needed (loose stool).     Historical Provider, MD  NON FORMULARY Take 2 tablets by mouth 2 (two) times daily. 1-3D Beta Glucan - Immune modulator    Historical Provider, MD  ondansetron (ZOFRAN) 8 MG tablet Take 1 tablet (8 mg total) by mouth every 8 (eight) hours as needed for nausea or vomiting. 08/06/14   Ladell Pier, MD  oxyCODONE-acetaminophen (ROXICET) 5-325 MG per tablet Take 1 tablet by mouth every 4 (four) hours as needed for severe pain. 0/8/65   [name], MD  pseudoephedrine (SUDAFED) 30 MG tablet Take 30 mg by mouth every 4 (four) hours as needed for congestion.    Historical Provider, MD  Pseudoephedrine-Ibuprofen (ADVIL COLD/SINUS PO) Take 1 tablet by mouth daily as needed (cold/sinus symptoms.).    Historical Provider, MD  simethicone (MYLICON) 784 MG chewable tablet Chew 125 mg by mouth every 6 (six) hours as needed for flatulence (gel pill OTC).    Historical Provider, MD   BP 135/86 mmHg  Pulse 125  Temp(Src) 97.4 F (36.3 C) (Oral)  Resp 22  SpO2 95% Physical Exam  Constitutional: He is oriented to person, place, and time. He appears well-developed and well-nourished.  HENT:  Head: Normocephalic and atraumatic.  Eyes: Conjunctivae and EOM are normal. Pupils are equal, round, and reactive to light. Right eye exhibits no discharge. Left eye exhibits no discharge. No  scleral icterus.  Neck: Normal range of motion. Neck supple. No JVD present.  Cardiovascular: Normal rate, regular rhythm and normal heart sounds.  Exam reveals no gallop and no friction rub.   No murmur heard. Pulmonary/Chest: Effort normal and breath sounds normal. No respiratory distress. He has no wheezes. He has no rales. He exhibits no tenderness.  Abdominal: He exhibits distension. He exhibits no mass. There is tenderness. There is guarding. There is no rebound.  Firm tender abdomen diffusely, colostomy in lower mid abdomen, no evidence of infection around the colostomy  Musculoskeletal: Normal range of motion. He exhibits no edema or tenderness.  Neurological: He is alert and oriented to person, place, and time.  Skin: Skin is warm and dry.  Psychiatric: He has a normal mood and affect. His behavior is normal. Judgment and thought content normal.  Nursing note and vitals reviewed.   ED Course  Procedures (including critical care time) Results for orders placed or performed during  the Villanueva encounter of 11/17/14  CBC with Differential  Result Value Ref Range   WBC 16.0 (H) 4.0 - 10.5 K/uL   RBC 5.24 4.22 - 5.81 MIL/uL   Hemoglobin 17.3 (H) 13.0 - 17.0 g/dL   HCT 48.4 39.0 - 52.0 %   MCV 92.4 78.0 - 100.0 fL   MCH 33.0 26.0 - 34.0 pg   MCHC 35.7 30.0 - 36.0 g/dL   RDW 14.0 11.5 - 15.5 %   Platelets 200 150 - 400 K/uL   Neutrophils Relative % 88 (H) 43 - 77 %   Neutro Abs 14.0 (H) 1.7 - 7.7 K/uL   Lymphocytes Relative 4 (L) 12 - 46 %   Lymphs Abs 0.7 0.7 - 4.0 K/uL   Monocytes Relative 7 3 - 12 %   Monocytes Absolute 1.2 (H) 0.1 - 1.0 K/uL   Eosinophils Relative 1 0 - 5 %   Eosinophils Absolute 0.1 0.0 - 0.7 K/uL   Basophils Relative 0 0 - 1 %   Basophils Absolute 0.0 0.0 - 0.1 K/uL  Comprehensive metabolic panel  Result Value Ref Range   Sodium 138 135 - 145 mmol/L   Potassium 3.1 (L) 3.5 - 5.1 mmol/L   Chloride 98 96 - 112 mmol/L   CO2 23 19 - 32 mmol/L    Glucose, Bld 132 (H) 70 - 99 mg/dL   BUN 8 6 - 23 mg/dL   Creatinine, Ser 1.02 0.50 - 1.35 mg/dL   Calcium 9.9 8.4 - 10.5 mg/dL   Total Protein 8.4 (H) 6.0 - 8.3 g/dL   Albumin 4.6 3.5 - 5.2 g/dL   AST 29 0 - 37 U/L   ALT 31 0 - 53 U/L   Alkaline Phosphatase 117 39 - 117 U/L   Total Bilirubin 1.8 (H) 0.3 - 1.2 mg/dL   GFR calc non Af Amer >90 >90 mL/min   GFR calc Af Amer >90 >90 mL/min   Anion gap 17 (H) 5 - 15  Lipase, blood  Result Value Ref Range   Lipase 19 11 - 59 U/L  I-Stat CG4 Lactic Acid, ED  Result Value Ref Range   Lactic Acid, Venous 3.10 (HH) 0.5 - 2.0 mmol/L   Comment NOTIFIED PHYSICIAN    Ct Abdomen Pelvis W Contrast  11/17/2014   CLINICAL DATA:  Initial evaluation for acute abdominal pain with bloating. Patient with history of colorectal cancer with colo-anal anastomosis. History of recent anoscopy with dilatation. Ileostomy in place. No stool passed through ileostomy since Thursday.  EXAM: CT ABDOMEN AND PELVIS WITH CONTRAST  TECHNIQUE: Multidetector CT imaging of the abdomen and pelvis was performed using the standard protocol following bolus administration of intravenous contrast.  CONTRAST:  187mL OMNIPAQUE IOHEXOL 300 MG/ML  SOLN  COMPARISON:  Prior radiograph from 05/23/2014 as well as prior CT from 01/29/2014.  FINDINGS: Small layering right pleural effusion is present.  The liver demonstrates a normal contrast enhanced appearance. Gallbladder within normal limits. No biliary dilatation. Spleen, adrenal glands, and pancreas within normal limits.  Kidneys are equal in size with symmetric enhancement. No nephrolithiasis, hydronephrosis, or focal enhancing renal mass.  Patient is status post low anterior partial colon resection with ileostomy. Colo-anal anastomosis in place per history. There is a large volume of free intraperitoneal air throughout the abdomen, concerning for perforation status post recent intervention. Moderate amount of free perihepatic fluid present as  well. There is small amount of free fluid within the pelvis as well. The small bowel is  diffusely dilated up to 4.8 cm in diameter with air-fluid levels, concerning for small bowel obstruction. Suspected transition point within the right lower quadrant (series 2, image 79). The ileum is decompressed distally as it courses into the ileostomy. There is associated mesenteric edema and free fluid.  Bladder within normal limits.  Prostate unremarkable.  Soft tissue stranding and/ or small volume free fluid present within the presacral space, which may be related to prior radiation and/or postsurgical changes. No new pathologically enlarged lymph nodes identified. There are shotty subcentimeter periaortic nodes. Normal intravascular enhancement seen within the abdomen and pelvis.  No acute osseous abnormality. No worrisome lytic or blastic osseous lesions.  IMPRESSION: 1. Large volume free air within the abdomen and pelvis, compatible with perforation. Exact location of perforation is not discretely identified. 2. Multiple dilated loops of small bowel with apparent transition point within the right lower quadrant, compatible with small bowel obstruction. The ileum is decompressed at the level of the ileostomy. 3. Moderate free perihepatic fluid. 4. Small layering right pleural effusion. Critical Value/emergent results were called by telephone at the time of interpretation on 11/17/2014 at 10:24 pm to the physician assistant, Montine Circle , who verbally acknowledged these results.   Electronically Signed   By: Jeannine Boga M.D.   On: 11/17/2014 22:39      EKG Interpretation None      MDM   Final diagnoses:  Abdominal pain  2. Small bowel obstruction 3. Free air in abdomen/bowel perforation  Patient with colorectal cancer and colostomy.  Now with severe abdominal pain and no output to colostomy.  Will check CT, labs, and treat pain.  Patient seen by and discussed with Dr. Ashok Cordia.  Abdomen  consistent is distended and very tender.  10:31 PM Received phone call from radiology.  Patient reportedly has massive amounts of free air indicative of perforation.  Also has SBO.  10:49 PM Patient seen by and discussed with Dr. Harlow Asa of general surgery.  Appreciate Dr. Harlow Asa for admitting the patient.  Will place NG tube.  Pain adequately controlled in ED.  Montine Circle, PA-C 11/17/14 2251  Lajean Saver, MD 11/17/14 [phone]

## 2014-11-17 NOTE — ED Notes (Signed)
Pt with Hx of colorectal cancer c/o abdominal bloating and abdominal pain onset after dilation and endoscopy of rectum last Thursday, ileostomy in place. Pt c/o constipation, states no stool has passed from ileostomy since Thursday, concerned about blockage. Denies nausea. Denies current chemo or radiation.

## 2014-11-17 NOTE — ED Notes (Signed)
Nurse starting IV and getting labs 

## 2014-11-18 LAB — CBC
HEMATOCRIT: 44.2 % (ref 39.0–52.0)
Hemoglobin: 15.1 g/dL (ref 13.0–17.0)
MCH: 32.1 pg (ref 26.0–34.0)
MCHC: 34.2 g/dL (ref 30.0–36.0)
MCV: 93.8 fL (ref 78.0–100.0)
Platelets: 172 10*3/uL (ref 150–400)
RBC: 4.71 MIL/uL (ref 4.22–5.81)
RDW: 14.2 % (ref 11.5–15.5)
WBC: 11 10*3/uL — ABNORMAL HIGH (ref 4.0–10.5)

## 2014-11-18 LAB — BASIC METABOLIC PANEL
Anion gap: 8 (ref 5–15)
BUN: 8 mg/dL (ref 6–23)
CO2: 23 mmol/L (ref 19–32)
CREATININE: 0.82 mg/dL (ref 0.50–1.35)
Calcium: 8.1 mg/dL — ABNORMAL LOW (ref 8.4–10.5)
Chloride: 107 mmol/L (ref 96–112)
GFR calc Af Amer: >90 mL/min (ref >90)
GLUCOSE: 164 mg/dL — AB (ref 70–99)
Potassium: 4.1 mmol/L (ref 3.5–5.1)
SODIUM: 138 mmol/L (ref 135–145)

## 2014-11-18 LAB — MRSA PCR SCREENING: MRSA by PCR: NEGATIVE

## 2014-11-18 MED ORDER — SODIUM CHLORIDE 0.9 % IV BOLUS (SEPSIS)
1000.0000 mL | Freq: Once | INTRAVENOUS | Status: AC
  Administered 2014-11-18: 1000 mL via INTRAVENOUS

## 2014-11-18 MED ORDER — SODIUM CHLORIDE 0.9 % IJ SOLN
10.0000 mL | INTRAMUSCULAR | Status: DC | PRN
  Administered 2014-11-21: 10 mL
  Filled 2014-11-18: qty 40

## 2014-11-18 MED ORDER — CETYLPYRIDINIUM CHLORIDE 0.05 % MT LIQD
7.0000 mL | Freq: Two times a day (BID) | OROMUCOSAL | Status: DC
  Administered 2014-11-18 – 2014-11-21 (×7): 7 mL via OROMUCOSAL

## 2014-11-18 NOTE — Progress Notes (Signed)
Subjective: PT feeling better this AM.  NGT in place and feels that has helped his abd distention  Objective: Vital signs in last 24 hours: Temp:  [97.4 F (36.3 C)-98.8 F (37.1 C)] 98.8 F (37.1 C) (04/10 0800) Pulse Rate:  [95-125] 98 (04/10 0600) Resp:  [8-22] 16 (04/10 0600) BP: (120-143)/(71-92) 120/73 mmHg (04/10 0600) SpO2:  [95 %-100 %] 95 % (04/10 0600) Weight:  [74.3 kg (163 lb 12.8 oz)] 74.3 kg (163 lb 12.8 oz) (04/10 0121) Last BM Date: 11/18/14  Intake/Output from previous day: 04/09 0701 - 04/10 0700 In: 817.5 [I.V.:625; IV Piggyback:12.5] Out: 835 [Urine:225; Emesis/NG output:550; Stool:60] Intake/Output this shift:    General appearance: alert and cooperative Resp: clear to auscultation bilaterally Cardio: regular rate and rhythm, S1, S2 normal, no murmur, click, rub or gallop GI: soft, distended, min ttp x4Q, ostomy pink/patent with min outout  Lab Results:   Recent Labs  11/17/14 1958 11/18/14 0408  WBC 16.0* 11.0*  HGB 17.3* 15.1  HCT 48.4 44.2  PLT 200 172   BMET  Recent Labs  11/17/14 1958 11/18/14 0408  NA 138 138  K 3.1* 4.1  CL 98 107  CO2 23 23  GLUCOSE 132* 164*  BUN 8 8  CREATININE 1.02 0.82  CALCIUM 9.9 8.1*   PT/INR No results for input(s): LABPROT, INR in the last 72 hours. ABG No results for input(s): PHART, HCO3 in the last 72 hours.  Invalid input(s): PCO2, PO2  Studies/Results: Ct Abdomen Pelvis W Contrast  11/17/2014   CLINICAL DATA:  Initial evaluation for acute abdominal pain with bloating. Patient with history of colorectal cancer with colo-anal anastomosis. History of recent anoscopy with dilatation. Ileostomy in place. No stool passed through ileostomy since Thursday.  EXAM: CT ABDOMEN AND PELVIS WITH CONTRAST  TECHNIQUE: Multidetector CT imaging of the abdomen and pelvis was performed using the standard protocol following bolus administration of intravenous contrast.  CONTRAST:  179mL OMNIPAQUE IOHEXOL 300  MG/ML  SOLN  COMPARISON:  Prior radiograph from 05/23/2014 as well as prior CT from 01/29/2014.  FINDINGS: Small layering right pleural effusion is present.  The liver demonstrates a normal contrast enhanced appearance. Gallbladder within normal limits. No biliary dilatation. Spleen, adrenal glands, and pancreas within normal limits.  Kidneys are equal in size with symmetric enhancement. No nephrolithiasis, hydronephrosis, or focal enhancing renal mass.  Patient is status post low anterior partial colon resection with ileostomy. Colo-anal anastomosis in place per history. There is a large volume of free intraperitoneal air throughout the abdomen, concerning for perforation status post recent intervention. Moderate amount of free perihepatic fluid present as well. There is small amount of free fluid within the pelvis as well. The small bowel is diffusely dilated up to 4.8 cm in diameter with air-fluid levels, concerning for small bowel obstruction. Suspected transition point within the right lower quadrant (series 2, image 79). The ileum is decompressed distally as it courses into the ileostomy. There is associated mesenteric edema and free fluid.  Bladder within normal limits.  Prostate unremarkable.  Soft tissue stranding and/ or small volume free fluid present within the presacral space, which may be related to prior radiation and/or postsurgical changes. No new pathologically enlarged lymph nodes identified. There are shotty subcentimeter periaortic nodes. Normal intravascular enhancement seen within the abdomen and pelvis.  No acute osseous abnormality. No worrisome lytic or blastic osseous lesions.  IMPRESSION: 1. Large volume free air within the abdomen and pelvis, compatible with perforation. Exact location of perforation  is not discretely identified. 2. Multiple dilated loops of small bowel with apparent transition point within the right lower quadrant, compatible with small bowel obstruction. The ileum is  decompressed at the level of the ileostomy. 3. Moderate free perihepatic fluid. 4. Small layering right pleural effusion. Critical Value/emergent results were called by telephone at the time of interpretation on 11/17/2014 at 10:24 pm to the physician assistant, Montine Circle , who verbally acknowledged these results.   Electronically Signed   By: Jeannine Boga M.D.   On: 11/17/2014 22:39    Anti-infectives: Anti-infectives    Start     Dose/Rate Route Frequency Ordered Stop   11/18/14 0400  piperacillin-tazobactam (ZOSYN) IVPB 3.375 g  Status:  Discontinued     3.375 g 12.5 mL/hr over 240 Minutes Intravenous Every 8 hours 11/17/14 2048 11/18/14 0129   11/17/14 2245  piperacillin-tazobactam (ZOSYN) IVPB 3.375 g     3.375 g 12.5 mL/hr over 240 Minutes Intravenous 3 times per day 11/17/14 2237     11/17/14 1930  piperacillin-tazobactam (ZOSYN) IVPB 3.375 g     3.375 g 100 mL/hr over 30 Minutes Intravenous  Once 11/17/14 1929 11/17/14 2044      Assessment/Plan: Perforation of ileoanal anastomosis Peritonitis History of rectal carcinoma Small bowel obstruction/Ileus  Con't NGT IVF Abx Mobilize    LOS: 1 day    Rosario Jacks., Anne Hahn 11/18/2014

## 2014-11-18 NOTE — Progress Notes (Signed)
ED called for report x2

## 2014-11-18 NOTE — Plan of Care (Signed)
Problem: Phase I Progression Outcomes Goal: Pain controlled with appropriate interventions Outcome: Progressing Pain well controlled w/ 1mg  IV Dilaudid q1hr PRN Goal: OOB as tolerated unless otherwise ordered Outcome: Progressing OOB to chair x2 hours today

## 2014-11-19 LAB — BASIC METABOLIC PANEL
Anion gap: 5 (ref 5–15)
BUN: 9 mg/dL (ref 6–23)
CO2: 26 mmol/L (ref 19–32)
Calcium: 8.2 mg/dL — ABNORMAL LOW (ref 8.4–10.5)
Chloride: 109 mmol/L (ref 96–112)
Creatinine, Ser: 0.88 mg/dL (ref 0.50–1.35)
GFR calc Af Amer: >90 mL/min (ref >90)
GFR calc non Af Amer: >90 mL/min (ref >90)
Glucose, Bld: 134 mg/dL — ABNORMAL HIGH (ref 70–99)
POTASSIUM: 4.3 mmol/L (ref 3.5–5.1)
SODIUM: 140 mmol/L (ref 135–145)

## 2014-11-19 LAB — CBC
HCT: 39.5 % (ref 39.0–52.0)
Hemoglobin: 13.2 g/dL (ref 13.0–17.0)
MCH: 31.8 pg (ref 26.0–34.0)
MCHC: 33.4 g/dL (ref 30.0–36.0)
MCV: 95.2 fL (ref 78.0–100.0)
Platelets: 176 10*3/uL (ref 150–400)
RBC: 4.15 MIL/uL — AB (ref 4.22–5.81)
RDW: 14.4 % (ref 11.5–15.5)
WBC: 5.9 10*3/uL (ref 4.0–10.5)

## 2014-11-19 NOTE — Progress Notes (Signed)
Subjective: PT feeling better this AM.  NGT in place and feels that has helped his abd distention and pain.  Ostomy functioning.  Objective: Vital signs in last 24 hours: Temp:  [98 F (36.7 C)-98.4 F (36.9 C)] 98.4 F (36.9 C) (04/11 0800) Pulse Rate:  [78-104] 93 (04/11 0756) Resp:  [10-19] 19 (04/11 0756) BP: (122-137)/(67-96) 126/67 mmHg (04/11 0756) SpO2:  [95 %-100 %] 97 % (04/11 0756) Weight:  [76.7 kg (169 lb 1.5 oz)] 76.7 kg (169 lb 1.5 oz) (04/11 0400) Last BM Date: 11/19/14  Intake/Output from previous day: 04/10 0701 - 04/11 0700 In: 4364 [P.O.:90; I.V.:3125; IV Piggyback:1149] Out: 2525 [Urine:900; Emesis/NG output:725; Stool:900] Intake/Output this shift: Total I/O In: 50 [IV Piggyback:50] Out: -   General appearance: alert and cooperative Resp: clear to auscultation bilaterally Cardio: regular rate and rhythm, S1, S2 normal, no murmur, click, rub or gallop GI: soft, distended, min ttp x4Q, ostomy pink/patent with min outout  Lab Results:   Recent Labs  11/18/14 0408 11/19/14 0500  WBC 11.0* 5.9  HGB 15.1 13.2  HCT 44.2 39.5  PLT 172 176   BMET  Recent Labs  11/18/14 0408 11/19/14 0500  NA 138 140  K 4.1 4.3  CL 107 109  CO2 23 26  GLUCOSE 164* 134*  BUN 8 9  CREATININE 0.82 0.88  CALCIUM 8.1* 8.2*   PT/INR No results for input(s): LABPROT, INR in the last 72 hours. ABG No results for input(s): PHART, HCO3 in the last 72 hours.  Invalid input(s): PCO2, PO2  Studies/Results: Ct Abdomen Pelvis W Contrast  11/17/2014   CLINICAL DATA:  Initial evaluation for acute abdominal pain with bloating. Patient with history of colorectal cancer with colo-anal anastomosis. History of recent anoscopy with dilatation. Ileostomy in place. No stool passed through ileostomy since Thursday.  EXAM: CT ABDOMEN AND PELVIS WITH CONTRAST  TECHNIQUE: Multidetector CT imaging of the abdomen and pelvis was performed using the standard protocol following bolus  administration of intravenous contrast.  CONTRAST:  160mL OMNIPAQUE IOHEXOL 300 MG/ML  SOLN  COMPARISON:  Prior radiograph from 05/23/2014 as well as prior CT from 01/29/2014.  FINDINGS: Small layering right pleural effusion is present.  The liver demonstrates a normal contrast enhanced appearance. Gallbladder within normal limits. No biliary dilatation. Spleen, adrenal glands, and pancreas within normal limits.  Kidneys are equal in size with symmetric enhancement. No nephrolithiasis, hydronephrosis, or focal enhancing renal mass.  Patient is status post low anterior partial colon resection with ileostomy. Colo-anal anastomosis in place per history. There is a large volume of free intraperitoneal air throughout the abdomen, concerning for perforation status post recent intervention. Moderate amount of free perihepatic fluid present as well. There is small amount of free fluid within the pelvis as well. The small bowel is diffusely dilated up to 4.8 cm in diameter with air-fluid levels, concerning for small bowel obstruction. Suspected transition point within the right lower quadrant (series 2, image 79). The ileum is decompressed distally as it courses into the ileostomy. There is associated mesenteric edema and free fluid.  Bladder within normal limits.  Prostate unremarkable.  Soft tissue stranding and/ or small volume free fluid present within the presacral space, which may be related to prior radiation and/or postsurgical changes. No new pathologically enlarged lymph nodes identified. There are shotty subcentimeter periaortic nodes. Normal intravascular enhancement seen within the abdomen and pelvis.  No acute osseous abnormality. No worrisome lytic or blastic osseous lesions.  IMPRESSION: 1. Large volume free  air within the abdomen and pelvis, compatible with perforation. Exact location of perforation is not discretely identified. 2. Multiple dilated loops of small bowel with apparent transition point within  the right lower quadrant, compatible with small bowel obstruction. The ileum is decompressed at the level of the ileostomy. 3. Moderate free perihepatic fluid. 4. Small layering right pleural effusion. Critical Value/emergent results were called by telephone at the time of interpretation on 11/17/2014 at 10:24 pm to the physician assistant, Montine Circle , who verbally acknowledged these results.   Electronically Signed   By: Jeannine Boga M.D.   On: 11/17/2014 22:39    Anti-infectives: Anti-infectives    Start     Dose/Rate Route Frequency Ordered Stop   11/18/14 0400  piperacillin-tazobactam (ZOSYN) IVPB 3.375 g  Status:  Discontinued     3.375 g 12.5 mL/hr over 240 Minutes Intravenous Every 8 hours 11/17/14 2048 11/18/14 0129   11/17/14 2245  piperacillin-tazobactam (ZOSYN) IVPB 3.375 g     3.375 g 12.5 mL/hr over 240 Minutes Intravenous 3 times per day 11/17/14 2237     11/17/14 1930  piperacillin-tazobactam (ZOSYN) IVPB 3.375 g     3.375 g 100 mL/hr over 30 Minutes Intravenous  Once 11/17/14 1929 11/17/14 2044      Assessment/Plan: History of rectal carcinoma Small bowel Ileus  Clamp NGT.  If tolerates, will d/c later this afternoon Cont IVF and Abx Mobilize Transfer to floor    LOS: 2 days    Alexys Lobello C. 3/71/0626

## 2014-11-19 NOTE — Progress Notes (Signed)
At 1515, received a call from DR Marcello Moores with an order to hook patient's NG tube back up to suction to check for residual. Physician stated that if patient had no residual, it was fine to remove NG tube and start patient on a clear liquid diet. Patient was placed on low wall suction for 15 minutes with no residual.  Removed NG tube at 1540. Patient tolerated well. Will start on clear liquids and continue to monitor patient. VSS.

## 2014-11-19 NOTE — Progress Notes (Signed)
Pt's NGT clamped per MD order.  Pt instructed to notify RN if nauseous or if abdomen feels distended.  Marnee Guarneri, RN

## 2014-11-20 MED ORDER — METRONIDAZOLE 500 MG PO TABS
500.0000 mg | ORAL_TABLET | Freq: Four times a day (QID) | ORAL | Status: DC
  Administered 2014-11-20 – 2014-11-22 (×9): 500 mg via ORAL
  Filled 2014-11-20 (×13): qty 1

## 2014-11-20 MED ORDER — CIPROFLOXACIN HCL 500 MG PO TABS
500.0000 mg | ORAL_TABLET | Freq: Two times a day (BID) | ORAL | Status: DC
  Administered 2014-11-20 – 2014-11-22 (×5): 500 mg via ORAL
  Filled 2014-11-20 (×7): qty 1

## 2014-11-20 NOTE — Progress Notes (Signed)
  Subjective: PT feeling better this AM.  NGT out and tolerating clears.  2L from ileostomy  Objective: Vital signs in last 24 hours: Temp:  [97.9 F (36.6 C)-98.8 F (37.1 C)] 98.8 F (37.1 C) (04/12 0518) Pulse Rate:  [77-93] 79 (04/12 0518) Resp:  [16-19] 16 (04/12 0518) BP: (109-134)/(59-75) 112/59 mmHg (04/12 0518) SpO2:  [94 %-99 %] 98 % (04/12 0518) Last BM Date: 11/19/14  Intake/Output from previous day: 04/11 0701 - 04/12 0700 In: 800 [I.V.:750; IV Piggyback:50] Out: 2425 [Urine:500; Stool:1925] Intake/Output this shift:   General appearance: alert and cooperative Resp: no labored breathing GI: soft, mildly distended, no ttp x4Q, ostomy pink/patent with dark green outout  Lab Results:   Recent Labs  11/18/14 0408 11/19/14 0500  WBC 11.0* 5.9  HGB 15.1 13.2  HCT 44.2 39.5  PLT 172 176   BMET  Recent Labs  11/18/14 0408 11/19/14 0500  NA 138 140  K 4.1 4.3  CL 107 109  CO2 23 26  GLUCOSE 164* 134*  BUN 8 9  CREATININE 0.82 0.88  CALCIUM 8.1* 8.2*   PT/INR No results for input(s): LABPROT, INR in the last 72 hours. ABG No results for input(s): PHART, HCO3 in the last 72 hours.  Invalid input(s): PCO2, PO2  Studies/Results: No results found.  Anti-infectives: Anti-infectives    Start     Dose/Rate Route Frequency Ordered Stop   11/20/14 0800  ciprofloxacin (CIPRO) tablet 500 mg     500 mg Oral 2 times daily 11/20/14 0747     11/20/14 0800  metroNIDAZOLE (FLAGYL) tablet 500 mg     500 mg Oral 4 times per day 11/20/14 0747     11/18/14 0400  piperacillin-tazobactam (ZOSYN) IVPB 3.375 g  Status:  Discontinued     3.375 g 12.5 mL/hr over 240 Minutes Intravenous Every 8 hours 11/17/14 2048 11/18/14 0129   11/17/14 2245  piperacillin-tazobactam (ZOSYN) IVPB 3.375 g  Status:  Discontinued     3.375 g 12.5 mL/hr over 240 Minutes Intravenous 3 times per day 11/17/14 2237 11/20/14 0747   11/17/14 1930  piperacillin-tazobactam (ZOSYN) IVPB 3.375 g      3.375 g 100 mL/hr over 30 Minutes Intravenous  Once 11/17/14 1929 11/17/14 2044      Assessment/Plan: History of rectal carcinoma Small bowel Ileus resolving  Advance to soft diet Cont IVF but decrease rate Will switch to PO abx Recheck labs in AM Mobilize Poss d/c in AM    LOS: 3 days    [place], Seth Villanueva C. 3/90/3009

## 2014-11-21 LAB — CBC
HCT: 34.2 % — ABNORMAL LOW (ref 39.0–52.0)
Hemoglobin: 11.7 g/dL — ABNORMAL LOW (ref 13.0–17.0)
MCH: 32.1 pg (ref 26.0–34.0)
MCHC: 34.2 g/dL (ref 30.0–36.0)
MCV: 94 fL (ref 78.0–100.0)
Platelets: 172 10*3/uL (ref 150–400)
RBC: 3.64 MIL/uL — ABNORMAL LOW (ref 4.22–5.81)
RDW: 14 % (ref 11.5–15.5)
WBC: 6 10*3/uL (ref 4.0–10.5)

## 2014-11-21 LAB — BASIC METABOLIC PANEL
Anion gap: 9 (ref 5–15)
BUN: 7 mg/dL (ref 6–23)
CO2: 26 mmol/L (ref 19–32)
Calcium: 8.4 mg/dL (ref 8.4–10.5)
Chloride: 102 mmol/L (ref 96–112)
Creatinine, Ser: 0.83 mg/dL (ref 0.50–1.35)
GFR calc Af Amer: >90 mL/min (ref >90)
GFR calc non Af Amer: >90 mL/min (ref >90)
Glucose, Bld: 112 mg/dL — ABNORMAL HIGH (ref 70–99)
Potassium: 3.7 mmol/L (ref 3.5–5.1)
Sodium: 137 mmol/L (ref 135–145)

## 2014-11-21 NOTE — Progress Notes (Signed)
  Subjective: PT feeling better this AM.  Tolerated soft diet.  Still feels a little bloated  Objective: Vital signs in last 24 hours: Temp:  [98.2 F (36.8 C)-99.6 F (37.6 C)] 99 F (37.2 C) (04/13 0534) Pulse Rate:  [87-90] 87 (04/13 0534) Resp:  [16-18] 16 (04/13 0534) BP: (118-139)/(63-76) 128/63 mmHg (04/13 0534) SpO2:  [97 %-100 %] 97 % (04/13 0534) Last BM Date: 11/20/14  Intake/Output from previous day: 04/12 0701 - 04/13 0700 In: 3738.8 [P.O.:480; I.V.:3258.8] Out: 3800 [Urine:2450; Stool:1350] Intake/Output this shift: Total I/O In: 400 [I.V.:400] Out: 325 [Stool:325] General appearance: alert and cooperative Resp: no labored breathing GI: soft, mildly distended, no ttp x4Q, ostomy pink/patent with dark green outout  Lab Results:   Recent Labs  11/19/14 0500 11/21/14 0550  WBC 5.9 6.0  HGB 13.2 11.7*  HCT 39.5 34.2*  PLT 176 172   BMET  Recent Labs  11/19/14 0500 11/21/14 0550  NA 140 137  K 4.3 3.7  CL 109 102  CO2 26 26  GLUCOSE 134* 112*  BUN 9 7  CREATININE 0.88 0.83  CALCIUM 8.2* 8.4   PT/INR No results for input(s): LABPROT, INR in the last 72 hours. ABG No results for input(s): PHART, HCO3 in the last 72 hours.  Invalid input(s): PCO2, PO2  Studies/Results: No results found.  Anti-infectives: Anti-infectives    Start     Dose/Rate Route Frequency Ordered Stop   11/20/14 0800  ciprofloxacin (CIPRO) tablet 500 mg     500 mg Oral 2 times daily 11/20/14 0747     11/20/14 0800  metroNIDAZOLE (FLAGYL) tablet 500 mg     500 mg Oral 4 times per day 11/20/14 0747     11/18/14 0400  piperacillin-tazobactam (ZOSYN) IVPB 3.375 g  Status:  Discontinued     3.375 g 12.5 mL/hr over 240 Minutes Intravenous Every 8 hours 11/17/14 2048 11/18/14 0129   11/17/14 2245  piperacillin-tazobactam (ZOSYN) IVPB 3.375 g  Status:  Discontinued     3.375 g 12.5 mL/hr over 240 Minutes Intravenous 3 times per day 11/17/14 2237 11/20/14 0747   11/17/14  1930  piperacillin-tazobactam (ZOSYN) IVPB 3.375 g     3.375 g 100 mL/hr over 30 Minutes Intravenous  Once 11/17/14 1929 11/17/14 2044      Assessment/Plan: History of rectal carcinoma Small bowel Ileus resolving  Cont soft diet SL IVF  Cont PO abx Mobilize Poss d/c in AM    LOS: 4 days    Seth Villanueva C. 0/63/0160

## 2014-11-22 MED ORDER — CIPROFLOXACIN HCL 500 MG PO TABS: 500.0000 mg | ORAL_TABLET | Freq: Two times a day (BID) | ORAL | Status: DC

## 2014-11-22 MED ORDER — METRONIDAZOLE 500 MG PO TABS: 500.0000 mg | ORAL_TABLET | Freq: Three times a day (TID) | ORAL | Status: DC

## 2014-11-22 NOTE — Discharge Instructions (Signed)
Discharge Instructions  1. DIET:  Be sure to include lots of fluids daily.  Avoid fast food or heavy meals as your are more likely to get nauseated.   2. You may have some mild rectal bleeding for the first few days after the procedure.  This should get less and less with time.  3. Take your usually prescribed home medications unless otherwise directed. a. If you have any pain, it is helpful to get up and walk around, as it is usually from excess gas. b. If this is not helpful, you can take an over-the-counter pain medication.  Choose one of the following that works best for you: i. Naproxen (Aleve, etc)  Two 220mg  tabs twice a day ii. Ibuprofen (Advil, etc) Three 200mg  tabs four times a day (every meal & bedtime) iii. If you still have pain after using one of these, please call the office 4. It is normal to not have a bowel movement for 2-3 days after colonoscopy.    5. ACTIVITIES as tolerated:   6. You may resume regular (light) daily activities beginning the next day--such as daily self-care, walking, climbing stairs--gradually increasing activities as tolerated.    WHEN TO CALL us [phone]: 1. Fever over 101.5 F (38.5 C)  2. Severe abdominal or chest pain  3. Large amount of rectal bleeding, passing multiple blood clots  4. Dizziness or shortness of breath 5. Increasing nausea or vomiting   The clinic staff is available to answer your questions during regular business hours (8:30am-5pm).  Please dont hesitate to call and ask to speak to one of our nurses for clinical concerns.   If you have a medical emergency, go to the nearest emergency room or call 911.  A surgeon from [facility] is always on call at the [facility], [place], [place], [place], [place]  [address] ? MAIN: [phone] ? TOLL FREE: [phone] ?  FAX [phone] [email or web address]

## 2014-11-22 NOTE — Discharge Summary (Signed)
Physician Discharge Summary  Patient ID: Seth Villanueva. MRN: [id number] DOB/AGE: Jan 12, 1981 34 y.o.  Admit date: 11/17/2014 Discharge date: 11/22/2014  Admission Diagnoses: colorectal stricture with perforation  Discharge Diagnoses:  Active Problems:   Anastomotic stricture of colorectal region   Discharged Condition: good  Hospital Course: Pt admitted with ileus after attempted dilation of colorectal stricture.  There was a perforation during the procedure, but he was diverted.  He was placed on IV antibiotics for 2 days and his bowel function returned.  He was switched to PO antibiotics with continued improvement.  His wbc returned to normal  Consults: None  Significant Diagnostic Studies: labs: cbc, chemistry  Treatments: IV hydration  Discharge Exam: Blood pressure 116/59, pulse 82, temperature 98.4 F (36.9 C), temperature source Oral, resp. rate 16, height 6\' 2"  (1.88 m), weight 76.7 kg (169 lb 1.5 oz), SpO2 97 %. Gen: NAD Abd: soft Ostomy functioning  Disposition: 01-Home or Self Care     Medication List    TAKE these medications        acetaminophen 325 MG tablet  Commonly known as:  TYLENOL  Take 325 mg by mouth once as needed for mild pain (pain).     ciprofloxacin 500 MG tablet  Commonly known as:  CIPRO  Take 1 tablet (500 mg total) by mouth 2 (two) times daily.     IMODIUM A-D PO  Take 2 mg by mouth daily as needed (loose stool).     lidocaine-prilocaine cream  Commonly known as:  EMLA  Apply 1 application topically as needed. Apply to [facility] site 1-2 hours prior to stick and cover with plastic wrap to numb site     metroNIDAZOLE 500 MG tablet  Commonly known as:  FLAGYL  Take 1 tablet (500 mg total) by mouth 3 (three) times daily.     NON FORMULARY  Take 2 tablets by mouth 2 (two) times daily. 1-3D Beta Glucan - Immune modulator     ondansetron 8 MG tablet  Commonly known as:  ZOFRAN  Take 1 tablet (8 mg total) by mouth every 8 (eight)  hours as needed for nausea or vomiting.     oxyCODONE-acetaminophen 5-325 MG per tablet  Commonly known as:  ROXICET  Take 1 tablet by mouth every 4 (four) hours as needed for severe pain.     simethicone 125 MG chewable tablet  Commonly known as:  MYLICON  Chew 275 mg by mouth every 6 (six) hours as needed for flatulence (indigestion).       Follow-up Information    Follow up with [name]., MD.   Specialty:  General Surgery   Why:  as scheduled   Contact information:   [address] [place] [address] [phone]       Signed: [name] 1/63/8466, 5:99 AM

## 2014-11-22 NOTE — Progress Notes (Signed)
11/22/14 1100  Left chest port Hep Locked.

## 2014-11-22 NOTE — Progress Notes (Signed)
11/22/14  1122  Reviewed discharge instructions with patient. Patient verbalized understanding of discharge instructions. Copy of discharge instructions given to patient.

## 2014-11-23 ENCOUNTER — Encounter: Payer: Self-pay | Admitting: Oncology

## 2014-11-23 NOTE — Progress Notes (Signed)
I placed the forms from [employer] on the desk of nurse for dr. Benay Spice

## 2014-11-27 ENCOUNTER — Encounter: Payer: Self-pay | Admitting: Oncology

## 2014-11-27 NOTE — Progress Notes (Signed)
I called and spoke with the patient to advise forms for [employer] are ready. I will mail him copy(verified address) and fax to them [phone]

## 2014-12-04 ENCOUNTER — Telehealth: Payer: Self-pay | Admitting: *Deleted

## 2014-12-04 ENCOUNTER — Encounter: Payer: Self-pay | Admitting: Oncology

## 2014-12-04 NOTE — Telephone Encounter (Signed)
TC from patient requesting " Return to Work" letter. It needs to include the following information so he does not lose his long term disability insurance: 1. Return to work 12/05/14 2. May work up until next scheduled surgery 3. Can work up to 20 hours per/week as able  Seth Villanueva states he will pick up letter early this afternoon (12/04/14) as he will start back to work tomorrow (12/05/14)  Call back # is [phone]

## 2014-12-07 ENCOUNTER — Encounter: Payer: Self-pay | Admitting: Oncology

## 2014-12-07 NOTE — Progress Notes (Signed)
I called to let the patient know the forms he gave me this week are the same from last time and I mailed him copy. He thgt were different. I advised if he needs another copy mailed to let me know. One on file

## 2014-12-24 ENCOUNTER — Ambulatory Visit: Payer: 59 | Admitting: Nurse Practitioner

## 2014-12-24 ENCOUNTER — Other Ambulatory Visit: Payer: 59

## 2014-12-24 ENCOUNTER — Ambulatory Visit (HOSPITAL_BASED_OUTPATIENT_CLINIC_OR_DEPARTMENT_OTHER): Payer: 59

## 2014-12-24 DIAGNOSIS — C20 Malignant neoplasm of rectum: Secondary | ICD-10-CM

## 2014-12-24 DIAGNOSIS — Z452 Encounter for adjustment and management of vascular access device: Secondary | ICD-10-CM | POA: Diagnosis not present

## 2014-12-24 DIAGNOSIS — Z95828 Presence of other vascular implants and grafts: Secondary | ICD-10-CM

## 2014-12-24 MED ORDER — SODIUM CHLORIDE 0.9 % IJ SOLN
10.0000 mL | INTRAMUSCULAR | Status: DC | PRN
  Administered 2014-12-24: 10 mL via INTRAVENOUS
  Filled 2014-12-24: qty 10

## 2014-12-24 NOTE — Patient Instructions (Signed)

## 2015-01-01 ENCOUNTER — Other Ambulatory Visit: Payer: 59

## 2015-01-01 ENCOUNTER — Ambulatory Visit: Payer: 59 | Admitting: Nurse Practitioner

## 2015-01-11 ENCOUNTER — Other Ambulatory Visit: Payer: Self-pay | Admitting: *Deleted

## 2015-01-11 ENCOUNTER — Telehealth: Payer: Self-pay | Admitting: *Deleted

## 2015-01-11 NOTE — Telephone Encounter (Signed)
Left message on voicemail informing pt office visit will be rescheduled to after CT scan. POF to schedulers to call pt with new appointment.

## 2015-01-14 ENCOUNTER — Telehealth: Payer: Self-pay | Admitting: Nurse Practitioner

## 2015-01-14 NOTE — Telephone Encounter (Signed)
Called and  Left a message with his new  appointment

## 2015-01-15 ENCOUNTER — Other Ambulatory Visit: Payer: 59

## 2015-01-15 ENCOUNTER — Ambulatory Visit: Payer: 59 | Admitting: Nurse Practitioner

## 2015-01-31 ENCOUNTER — Ambulatory Visit (HOSPITAL_COMMUNITY)
Admission: RE | Admit: 2015-01-31 | Discharge: 2015-01-31 | Disposition: A | Discharge status: Home / Self Care | Payer: 59 | Source: Ambulatory Visit | Attending: Oncology | Admitting: Oncology

## 2015-01-31 DIAGNOSIS — C20 Malignant neoplasm of rectum: Secondary | ICD-10-CM | POA: Insufficient documentation

## 2015-01-31 DIAGNOSIS — Z9221 Personal history of antineoplastic chemotherapy: Secondary | ICD-10-CM | POA: Diagnosis not present

## 2015-01-31 DIAGNOSIS — Z933 Colostomy status: Secondary | ICD-10-CM | POA: Insufficient documentation

## 2015-01-31 DIAGNOSIS — Z923 Personal history of irradiation: Secondary | ICD-10-CM | POA: Diagnosis not present

## 2015-01-31 MED ORDER — IOHEXOL 300 MG/ML  SOLN
100.0000 mL | Freq: Once | INTRAMUSCULAR | Status: AC | PRN
  Administered 2015-01-31: 100 mL via INTRAVENOUS

## 2015-02-01 ENCOUNTER — Telehealth: Payer: Self-pay | Admitting: Oncology

## 2015-02-01 ENCOUNTER — Ambulatory Visit (HOSPITAL_BASED_OUTPATIENT_CLINIC_OR_DEPARTMENT_OTHER): Payer: 59 | Admitting: Nurse Practitioner

## 2015-02-01 ENCOUNTER — Other Ambulatory Visit (HOSPITAL_BASED_OUTPATIENT_CLINIC_OR_DEPARTMENT_OTHER): Payer: 59

## 2015-02-01 VITALS — BP 125/78 | HR 65 | Temp 98.1°F | Resp 18 | Ht 74.0 in | Wt 156.9 lb

## 2015-02-01 DIAGNOSIS — C20 Malignant neoplasm of rectum: Secondary | ICD-10-CM | POA: Diagnosis not present

## 2015-02-01 DIAGNOSIS — Z85048 Personal history of other malignant neoplasm of rectum, rectosigmoid junction, and anus: Secondary | ICD-10-CM | POA: Diagnosis not present

## 2015-02-01 DIAGNOSIS — Z95828 Presence of other vascular implants and grafts: Secondary | ICD-10-CM

## 2015-02-01 LAB — BASIC METABOLIC PANEL (CC13)
Anion Gap: 7 mEq/L (ref 3–11)
BUN: 13.4 mg/dL (ref 7.0–26.0)
CALCIUM: 8.8 mg/dL (ref 8.4–10.4)
CO2: 27 meq/L (ref 22–29)
Chloride: 106 mEq/L (ref 98–109)
Creatinine: 0.9 mg/dL (ref 0.7–1.3)
EGFR: >90 mL/min/{1.73_m2} (ref >90)
Glucose: 129 mg/dl (ref 70–140)
POTASSIUM: 3.8 meq/L (ref 3.5–5.1)
Sodium: 141 mEq/L (ref 136–145)

## 2015-02-01 LAB — CEA: CEA: 1 ng/mL (ref 0.0–5.0)

## 2015-02-01 MED ORDER — SODIUM CHLORIDE 0.9 % IJ SOLN
10.0000 mL | INTRAMUSCULAR | Status: DC | PRN
  Administered 2015-02-01: 10 mL via INTRAVENOUS
  Filled 2015-02-01: qty 10

## 2015-02-01 MED ORDER — HEPARIN SOD (PORK) LOCK FLUSH 100 UNIT/ML IV SOLN
500.0000 [IU] | Freq: Once | INTRAVENOUS | Status: AC
  Administered 2015-02-01: 500 [IU] via INTRAVENOUS
  Filled 2015-02-01: qty 5

## 2015-02-01 NOTE — Telephone Encounter (Signed)
Gave and printed appt sched and avs for pt for June thru DEC °

## 2015-02-01 NOTE — Progress Notes (Addendum)
Seth Villanueva OFFICE PROGRESS NOTE   Diagnosis:  Rectal cancer  INTERVAL HISTORY:   Seth Villanueva returns as scheduled. He feels well. He denies pain. Ostomy is functioning normally. He has a good appetite. No shortness of breath. Neuropathy symptoms in the hands are better. He continues to have some numbness in the feet.  Objective:  Vital signs in last 24 hours:  Blood pressure 125/78, pulse 65, temperature 98.1 F (36.7 C), temperature source Oral, resp. rate 18, height '6\' 2"'  (1.88 m), weight 156 lb 14.4 oz (71.169 kg), SpO2 100 %.    HEENT: No thrush or ulcers. Lymphatics: No palpable cervical, supraclavicular, axillary or inguinal lymph nodes. Resp: Lungs clear bilaterally. Cardio: Regular rate and rhythm. GI: Abdomen soft and nontender. No hepatomegaly. Right lower quadrant ileostomy. No mass. Vascular: No leg edema. Port-A-Cath without erythema.   Lab Results:  Lab Results  Component Value Date   WBC 6.0 11/21/2014   HGB 11.7* 11/21/2014   HCT 34.2* 11/21/2014   MCV 94.0 11/21/2014   PLT 172 11/21/2014   NEUTROABS 14.0* 11/17/2014    Imaging:  Ct Chest W Contrast  01/31/2015   CLINICAL DATA:  Rectal cancer. Colostomy. Prior radiation therapy and chemotherapy. Restaging.  EXAM: CT CHEST, ABDOMEN, AND PELVIS WITH CONTRAST  TECHNIQUE: Multidetector CT imaging of the chest, abdomen and pelvis was performed following the standard protocol during bolus administration of intravenous contrast.  CONTRAST:  179m OMNIPAQUE IOHEXOL 300 MG/ML  SOLN  COMPARISON:  Multiple exams, including 02/01/2014 and 11/17/2014  FINDINGS: CT chest FINDINGS  Mediastinum/Nodes: Left-sided Port-A-Cath, tip in the SVC. No adenopathy.  Lungs/Pleura: Unremarkable  Musculoskeletal: Unremarkable  CT ABDOMEN PELVIS FINDINGS  Hepatobiliary: Mildly contracted gallbladder.  Pancreas: Unremarkable  Spleen: Unremarkable  Adrenals/Urinary Tract: Unremarkable  Stomach/Bowel: Right lower quadrant  diverting loop ileostomy. No dilated bowel. Poor definition of the presumed postoperative region of the rectum, with lack of regional fat planes and presacral stranding. The localized extraluminal gas previously in this vicinity on the CT from 11/17/2014 is no longer present. The rectum is indistinct in the vicinity of the anastomosis.  Vascular/Lymphatic: Small retroperitoneal lymph nodes are not pathologically enlarged by size criteria. There stranding in the perirectal space making it difficult to assess for perirectal lymph nodes certainly some small perirectal lymph nodes are present, short axis diameter in the 4 mm range, similar to prior.  Reproductive: The seminal vesicles are indistinct due to poor definition of surrounding fat planes, probably from radiotherapy. Similarly the prostate gland margins are somewhat indistinct.  Other: No supplemental non-categorized findings.  Musculoskeletal: Unremarkable  IMPRESSION: 1. No positive evidence of recurrence. Poor definition of fat planes and indistinct density in the presacral space and perirectal space likely attributable to radiation therapy and partially attributable to prior surgery. Negative predictive value for early local recurrence is fairly low due to the indistinctness of tissue planes in this vicinity. No liver metastatic disease or pathologic adenopathy identified. 2. Right lower quadrant diverting loop ileostomy.   Electronically Signed   By: Seth ClinesM.D.   On: 01/31/2015 12:36   Ct Abdomen Pelvis W Contrast  01/31/2015   CLINICAL DATA:  Rectal cancer. Colostomy. Prior radiation therapy and chemotherapy. Restaging.  EXAM: CT CHEST, ABDOMEN, AND PELVIS WITH CONTRAST  TECHNIQUE: Multidetector CT imaging of the chest, abdomen and pelvis was performed following the standard protocol during bolus administration of intravenous contrast.  CONTRAST:  1056mOMNIPAQUE IOHEXOL 300 MG/ML  SOLN  COMPARISON:  Multiple exams, including  02/01/2014  and 11/17/2014  FINDINGS: CT chest FINDINGS  Mediastinum/Nodes: Left-sided Port-A-Cath, tip in the SVC. No adenopathy.  Lungs/Pleura: Unremarkable  Musculoskeletal: Unremarkable  CT ABDOMEN PELVIS FINDINGS  Hepatobiliary: Mildly contracted gallbladder.  Pancreas: Unremarkable  Spleen: Unremarkable  Adrenals/Urinary Tract: Unremarkable  Stomach/Bowel: Right lower quadrant diverting loop ileostomy. No dilated bowel. Poor definition of the presumed postoperative region of the rectum, with lack of regional fat planes and presacral stranding. The localized extraluminal gas previously in this vicinity on the CT from 11/17/2014 is no longer present. The rectum is indistinct in the vicinity of the anastomosis.  Vascular/Lymphatic: Small retroperitoneal lymph nodes are not pathologically enlarged by size criteria. There stranding in the perirectal space making it difficult to assess for perirectal lymph nodes certainly some small perirectal lymph nodes are present, short axis diameter in the 4 mm range, similar to prior.  Reproductive: The seminal vesicles are indistinct due to poor definition of surrounding fat planes, probably from radiotherapy. Similarly the prostate gland margins are somewhat indistinct.  Other: No supplemental non-categorized findings.  Musculoskeletal: Unremarkable  IMPRESSION: 1. No positive evidence of recurrence. Poor definition of fat planes and indistinct density in the presacral space and perirectal space likely attributable to radiation therapy and partially attributable to prior surgery. Negative predictive value for early local recurrence is fairly low due to the indistinctness of tissue planes in this vicinity. No liver metastatic disease or pathologic adenopathy identified. 2. Right lower quadrant diverting loop ileostomy.   Electronically Signed   By: Seth Villanueva M.D.   On: 01/31/2015 12:36    Medications: I have reviewed the patient's current  medications.  Assessment/Plan: 1. 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 Completion of radiation and concurrent Xeloda 03/28/2014.  Low anterior resection, diverting loop ileostomy, and coloanal anastomosis 05/18/2014,ypT2,ypN2, no loss of mismatch repair protein expression  Cycle 1 adjuvant FOLFOX 06/11/2014  Cycle 2 adjuvant FOLFOX 06/25/2014  Cycle 3 adjuvant FOLFOX 07/09/2014  Cycle 4 adjuvant FOLFOX 07/23/2014  Cycle 5 adjuvant FOLFOX 08/06/2014  Cycle 6 adjuvant FOLFOX 08/20/2014  Cycle 7 adjuvant FOLFOX 09/03/2014  Cycle 8 adjuvant FOLFOX 09/17/2014-oxaliplatin held  Cycle 9 adjuvant FOLFOX 10/01/2014-oxaliplatin held  CT scans chest/abdomen/pelvis 01/31/2015 with no evidence of recurrence. No liver metastatic disease or pathologic adenopathy identified. 2. History of rectal pain secondary to #1. Improved. 3. Phlebitis right upper inner arm 08/20/2014. Resolved. 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   Disposition: Mr. Belmontes appears stable. He is in clinical remission from rectal cancer. We will follow-up on the CEA from today. He will return for a follow-up visit and CEA in 6 months.   The Port-A-Cath is being flushed today. He will return for the next flush in 6 weeks. He will coordinate timing of Port-A-Cath removal with Dr. Marcello Moores. He continues follow-up with Dr. Marcello Moores regarding the stricture.  Patient seen with Dr. Benay Spice.  Ned Card ANP/GNP-BC   02/01/2015  9:46  AM This was a shared visit with Ned Card. Mr. Bastyr is in clinical remission from rectal cancer. He will continue follow-up with Dr. Marcello Moores for management of the colorectal stricture/perforation. He will return  for an office visit and CEA in 6 months.  He will ask Dr. Marcello Moores to remove the Port-A-Cath at the time of the ileostomy reversal procedure.  Julieanne Manson, M.D.

## 2015-02-04 ENCOUNTER — Telehealth: Payer: Self-pay | Admitting: *Deleted

## 2015-02-04 NOTE — Telephone Encounter (Signed)
-----   Message from Ladell Pier, MD sent at 02/01/2015  5:52 PM EDT ----- Please call patient, cea is normal

## 2015-02-04 NOTE — Telephone Encounter (Signed)
Called pt with results, pt appreciated call and knows to call us with any questions or concerns. No concerns voiced at this time.

## 2015-02-26 ENCOUNTER — Other Ambulatory Visit: Payer: Self-pay | Admitting: General Surgery

## 2015-02-26 NOTE — H&P (Signed)
Seth Villanueva Elwyn Klosinski 02/26/2015 12:00 PM Location: [facility] Patient #: [id number] DOB: 06-Oct-1980 Single / Language: [name] / Race: White Male History of Present Illness [name]; 4/69/6295 12:56 PM) Patient words: 3 month rectal cancer follow up.  The patient is a 34 year old male presenting for a post-operative visit. He is doing well. He is getting his strength back. He is tolerating a diet. Denies abd pain. Denies any rectal drainage. No problems with his ostomy. 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 Compazine *ANTIPSYCHOTICS/ANTIMANIC AGENTS* anxiety  Medication History Elbert Ewings, CMA; 02/26/2015 12:01 PM) Flagyl (500MG  Tablet, 2 (two) Tablet Oral SEE NOTE, Taken starting 04/25/2014) Active. (Take at 2pm, 3pm, and 10pm the day prior to your colon operation) Neomycin Sulfate (500MG  Tablet, 2 (two) Tablet Oral SEE NOTE, Taken starting 04/25/2014) Active. (TAKE TWO TABLETS AT 2 PM, 3 PM, AND 10 PM THE DAY PRIOR TO SURGERY) OxyCODONE HCl (5MG  Tablet, Oral prn) Active. Imodium A-D (2MG  Tablet, Oral prn) Active. Mylicon (40MG /0.6ML Suspension, Oral prn) Active. Ondansetron HCl (8MG  Tablet, Oral) Active. Hydrocortisone Acetate (25MG  Suppository, Rectal prn) Active. Polyethylene Glycol 3350 (Oral prn) Active. Acetaminophen (325MG  Tablet, Oral prn) Active. Ibuprofen (200MG  Capsule, Oral prn) Active. Sudafed (30MG  Tablet,  Oral prn) Active. Medications Reconciled  Social History [name], MD; 5/64/3329 12:56 PM) Alcohol use Occasional alcohol use. Tobacco use Former smoker. No caffeine use No drug use  Family History [name], MD; 12/26/8414 12:56 PM) Thyroid problems Family Members In General. Heart disease in male family member before age 84 Hypertension Family Members In General. Breast Cancer Family Members In General. Heart Disease Family Members In General. Heart disease in male family member before age 63 Arthritis Family Members In [place] Elbert Ewings CMA; 02/26/2015 12:02 PM) 02/26/2015 12:01 PM Weight: 158 lb Height: 74in Body Surface Area: 1.93 m Body Mass Index: 20.29 kg/m Temp.: 98.27F(Oral)  Pulse: 94 (Regular)  BP: 120/88 (Sitting, Left Arm, Standard)     Physical Exam [name]; 01/14/3015 12:57 PM)  General Mental Status-Alert. General Appearance-Not in acute distress. Voice-Normal.  Integumentary Global Assessment Upon inspection and palpation of skin surfaces of the - Distribution of scalp and body hair is normal. General Characteristics Overall examination of the patient's skin reveals - no rashes and no suspicious lesions.  Eye Eyeball - Left-Extraocular movements intact, No Nystagmus. Eyeball - Right-Extraocular movements intact, No Nystagmus. Upper Eyelid - Left-No Cyanotic. Upper Eyelid - Right-No Cyanotic.  Chest and Lung Exam Inspection Accessory muscles - No use of accessory muscles in breathing.  Cardiovascular Cardiovascular examination reveals -normal heart sounds, regular rate and rhythm with no murmurs.  Abdomen Inspection Skin - Inspection of the skin of the abdomen reveals - Note: Incisions healing well. Palpation/Percussion Palpation and Percussion of the abdomen reveal - Soft and Non Tender. Note: oatomy pink.  Rectal Anorectal Exam Internal - Note: Stricture noted  at anastomosis, dilated manually somewhat.  Neurologic Neurologic evaluation reveals -normal attention span and ability to concentrate, able to name objects and repeat phrases. Appropriate fund  of knowledge and normal coordination.  Neuropsychiatric Mental status exam performed with findings of-able to articulate well with normal speech/language, rate, volume and coherence and no evidence of hallucinations, delusions, obsessions or homicidal/suicidal ideation. Orientation-oriented X3.  Musculoskeletal Global Assessment Gait and Station - normal gait and station.    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 He is status post ultralow anterior resection for this cancer after neoadjuvant chemotherapy. He had a diverting loop ileostomy. I was unable to obtain access to the anal canal digitally in the office after his surgery. It was suspected that he had some difficulty healing due to anal pain after surgery. I did an exam under anesthesia in the operating room approximately 3 months ago. I was able to dilate the anastomosis somewhat but able to dilate it completely and developed a rectal perforation. This has since healed. He has gotten his follow-up CT scans which show no signs of recurrence or metastases. We discussed today the next step of the procedure would be to try to get a colonoscope down his colon through his ileostomy site. Maybe at that point we could locate the other end of his colon and open this up better. We discussed the risk of this which would be damage to his ileostomy and perforation of his bowel. We discussed that even if we do find the anastomosis and dilate this, he may still stricture back down and be unable to be reversed. We discussed the possibility of taking down the entire anastomosis and redoing this and the success rate of this as well. I believe he understands  these risk and is willing to undergo the procedure. We will try to get this scheduled as soon as possible.

## 2015-03-11 ENCOUNTER — Encounter: Payer: Self-pay | Admitting: Oncology

## 2015-03-11 NOTE — Progress Notes (Signed)
I placed forms from [place] on desk of nurse of dr sherrill.

## 2015-03-13 ENCOUNTER — Telehealth: Payer: Self-pay | Admitting: *Deleted

## 2015-03-13 NOTE — Telephone Encounter (Signed)
Received disability forms from [place]. Left message on voicemail requesting pt call office. Disability forms should be sent to surgeon. Pt is no longer on chemo. Next office visit is Dec.

## 2015-04-16 ENCOUNTER — Encounter (HOSPITAL_COMMUNITY): Payer: Self-pay | Admitting: *Deleted

## 2015-04-18 ENCOUNTER — Observation Stay (HOSPITAL_COMMUNITY)
Admission: RE | Admit: 2015-04-18 | Discharge: 2015-04-21 | Disposition: A | Discharge status: Home / Self Care | Payer: 59 | Source: Ambulatory Visit | Attending: General Surgery | Admitting: General Surgery

## 2015-04-18 ENCOUNTER — Encounter (HOSPITAL_COMMUNITY): Payer: Self-pay | Admitting: *Deleted

## 2015-04-18 ENCOUNTER — Ambulatory Visit (HOSPITAL_COMMUNITY): Payer: 59 | Admitting: Anesthesiology

## 2015-04-18 ENCOUNTER — Encounter (HOSPITAL_COMMUNITY)
Admission: RE | Disposition: A | Discharge status: Home / Self Care | Payer: Self-pay | Source: Ambulatory Visit | Attending: General Surgery

## 2015-04-18 DIAGNOSIS — T451X5A Adverse effect of antineoplastic and immunosuppressive drugs, initial encounter: Secondary | ICD-10-CM | POA: Diagnosis present

## 2015-04-18 DIAGNOSIS — Z9221 Personal history of antineoplastic chemotherapy: Secondary | ICD-10-CM

## 2015-04-18 DIAGNOSIS — K624 Stenosis of anus and rectum: Secondary | ICD-10-CM | POA: Diagnosis not present

## 2015-04-18 DIAGNOSIS — R188 Other ascites: Secondary | ICD-10-CM | POA: Diagnosis not present

## 2015-04-18 DIAGNOSIS — Z9049 Acquired absence of other specified parts of digestive tract: Secondary | ICD-10-CM | POA: Diagnosis present

## 2015-04-18 DIAGNOSIS — I4510 Unspecified right bundle-branch block: Secondary | ICD-10-CM | POA: Diagnosis present

## 2015-04-18 DIAGNOSIS — Z79891 Long term (current) use of opiate analgesic: Secondary | ICD-10-CM

## 2015-04-18 DIAGNOSIS — R5082 Postprocedural fever: Secondary | ICD-10-CM | POA: Diagnosis not present

## 2015-04-18 DIAGNOSIS — C20 Malignant neoplasm of rectum: Secondary | ICD-10-CM | POA: Diagnosis present

## 2015-04-18 DIAGNOSIS — G620 Drug-induced polyneuropathy: Secondary | ICD-10-CM | POA: Diagnosis present

## 2015-04-18 DIAGNOSIS — Z888 Allergy status to other drugs, medicaments and biological substances status: Secondary | ICD-10-CM

## 2015-04-18 DIAGNOSIS — Z87891 Personal history of nicotine dependence: Secondary | ICD-10-CM

## 2015-04-18 DIAGNOSIS — E860 Dehydration: Secondary | ICD-10-CM | POA: Diagnosis not present

## 2015-04-18 DIAGNOSIS — Z881 Allergy status to other antibiotic agents status: Secondary | ICD-10-CM

## 2015-04-18 DIAGNOSIS — Z932 Ileostomy status: Secondary | ICD-10-CM

## 2015-04-18 DIAGNOSIS — K913 Postprocedural intestinal obstruction: Secondary | ICD-10-CM | POA: Diagnosis not present

## 2015-04-18 DIAGNOSIS — Z79899 Other long term (current) drug therapy: Secondary | ICD-10-CM

## 2015-04-18 DIAGNOSIS — Z923 Personal history of irradiation: Secondary | ICD-10-CM

## 2015-04-18 HISTORY — PX: REPAIR OF RECTAL PROLAPSE: SHX6420

## 2015-04-18 LAB — CBC
HCT: 43.3 % (ref 39.0–52.0)
Hemoglobin: 15.2 g/dL (ref 13.0–17.0)
MCH: 32.1 pg (ref 26.0–34.0)
MCHC: 35.1 g/dL (ref 30.0–36.0)
MCV: 91.4 fL (ref 78.0–100.0)
Platelets: 155 10*3/uL (ref 150–400)
RBC: 4.74 MIL/uL (ref 4.22–5.81)
RDW: 13.3 % (ref 11.5–15.5)
WBC: 4.9 10*3/uL (ref 4.0–10.5)

## 2015-04-18 SURGERY — REPAIR, PROLAPSE, RECTUM
Anesthesia: General

## 2015-04-18 MED ORDER — ROCURONIUM BROMIDE 100 MG/10ML IV SOLN
INTRAVENOUS | Status: DC | PRN
  Administered 2015-04-18: 50 mg via INTRAVENOUS
  Administered 2015-04-18: 10 mg via INTRAVENOUS

## 2015-04-18 MED ORDER — FENTANYL CITRATE (PF) 100 MCG/2ML IJ SOLN
INTRAMUSCULAR | Status: DC | PRN
  Administered 2015-04-18: 50 ug via INTRAVENOUS
  Administered 2015-04-18: 100 ug via INTRAVENOUS

## 2015-04-18 MED ORDER — HYDROCODONE-ACETAMINOPHEN 5-325 MG PO TABS
1.0000 | ORAL_TABLET | ORAL | Status: DC | PRN
  Administered 2015-04-18 – 2015-04-19 (×4): 2 via ORAL
  Filled 2015-04-18 (×4): qty 2

## 2015-04-18 MED ORDER — PROPOFOL 10 MG/ML IV BOLUS
INTRAVENOUS | Status: AC
  Filled 2015-04-18: qty 20

## 2015-04-18 MED ORDER — ACETAMINOPHEN 325 MG PO TABS: 650.0000 mg | ORAL_TABLET | Freq: Four times a day (QID) | ORAL | Status: DC | PRN

## 2015-04-18 MED ORDER — ACETAMINOPHEN 650 MG RE SUPP: 650.0000 mg | Freq: Four times a day (QID) | RECTAL | Status: DC | PRN

## 2015-04-18 MED ORDER — SODIUM CHLORIDE 0.9 % IV SOLN
INTRAVENOUS | Status: AC
Start: 2015-04-18 — End: 2015-04-18
  Filled 2015-04-18: qty 1

## 2015-04-18 MED ORDER — SIMETHICONE 80 MG PO CHEW
40.0000 mg | CHEWABLE_TABLET | Freq: Four times a day (QID) | ORAL | Status: DC | PRN
  Administered 2015-04-19: 40 mg via ORAL
  Filled 2015-04-18 (×3): qty 1

## 2015-04-18 MED ORDER — ONDANSETRON 4 MG PO TBDP: 4.0000 mg | ORAL_TABLET | Freq: Four times a day (QID) | ORAL | Status: DC | PRN

## 2015-04-18 MED ORDER — FENTANYL CITRATE (PF) 100 MCG/2ML IJ SOLN: 25.0000 ug | INTRAMUSCULAR | Status: DC | PRN

## 2015-04-18 MED ORDER — ONDANSETRON HCL 4 MG/2ML IJ SOLN
4.0000 mg | Freq: Four times a day (QID) | INTRAMUSCULAR | Status: DC | PRN
  Administered 2015-04-19 – 2015-04-20 (×2): 4 mg via INTRAVENOUS
  Filled 2015-04-18 (×2): qty 2

## 2015-04-18 MED ORDER — LIDOCAINE HCL (CARDIAC) 20 MG/ML IV SOLN
INTRAVENOUS | Status: AC
  Filled 2015-04-18: qty 5

## 2015-04-18 MED ORDER — CETYLPYRIDINIUM CHLORIDE 0.05 % MT LIQD
7.0000 mL | Freq: Two times a day (BID) | OROMUCOSAL | Status: DC
  Administered 2015-04-18 – 2015-04-21 (×5): 7 mL via OROMUCOSAL

## 2015-04-18 MED ORDER — KCL IN DEXTROSE-NACL 20-5-0.45 MEQ/L-%-% IV SOLN
INTRAVENOUS | Status: AC
  Filled 2015-04-18: qty 1000

## 2015-04-18 MED ORDER — PROPOFOL 10 MG/ML IV BOLUS
INTRAVENOUS | Status: DC | PRN
  Administered 2015-04-18: 200 mg via INTRAVENOUS

## 2015-04-18 MED ORDER — LACTATED RINGERS IV SOLN
INTRAVENOUS | Status: DC
  Administered 2015-04-18: 1000 mL via INTRAVENOUS

## 2015-04-18 MED ORDER — BUPIVACAINE-EPINEPHRINE 0.5% -1:200000 IJ SOLN
INTRAMUSCULAR | Status: DC | PRN
  Administered 2015-04-18: 18 mL

## 2015-04-18 MED ORDER — BUPIVACAINE-EPINEPHRINE (PF) 0.5% -1:200000 IJ SOLN
INTRAMUSCULAR | Status: AC
  Filled 2015-04-18: qty 30

## 2015-04-18 MED ORDER — MIDAZOLAM HCL 2 MG/2ML IJ SOLN
INTRAMUSCULAR | Status: AC
  Filled 2015-04-18: qty 4

## 2015-04-18 MED ORDER — ENOXAPARIN SODIUM 40 MG/0.4ML ~~LOC~~ SOLN
40.0000 mg | SUBCUTANEOUS | Status: DC
  Administered 2015-04-19 – 2015-04-21 (×3): 40 mg via SUBCUTANEOUS
  Filled 2015-04-18 (×4): qty 0.4

## 2015-04-18 MED ORDER — MORPHINE SULFATE (PF) 2 MG/ML IV SOLN
2.0000 mg | INTRAVENOUS | Status: DC | PRN
  Administered 2015-04-18 (×2): 2 mg via INTRAVENOUS
  Filled 2015-04-18 (×2): qty 1

## 2015-04-18 MED ORDER — SENNOSIDES-DOCUSATE SODIUM 8.6-50 MG PO TABS
1.0000 | ORAL_TABLET | Freq: Every day | ORAL | Status: DC
  Administered 2015-04-18: 1 via ORAL
  Filled 2015-04-18 (×3): qty 1

## 2015-04-18 MED ORDER — LIDOCAINE HCL (CARDIAC) 20 MG/ML IV SOLN
INTRAVENOUS | Status: DC | PRN
  Administered 2015-04-18: 100 mg via INTRAVENOUS

## 2015-04-18 MED ORDER — KCL IN DEXTROSE-NACL 20-5-0.45 MEQ/L-%-% IV SOLN
INTRAVENOUS | Status: DC
Start: 2015-04-18 — End: 2015-04-21
  Administered 2015-04-18: 14:00:00 via INTRAVENOUS
  Administered 2015-04-19 (×2): 50 mL/h via INTRAVENOUS
  Administered 2015-04-20: 21:00:00 via INTRAVENOUS
  Filled 2015-04-18 (×6): qty 1000

## 2015-04-18 MED ORDER — LACTATED RINGERS IV SOLN: INTRAVENOUS | Status: DC

## 2015-04-18 MED ORDER — FENTANYL CITRATE (PF) 250 MCG/5ML IJ SOLN
INTRAMUSCULAR | Status: AC
  Filled 2015-04-18: qty 25

## 2015-04-18 MED ORDER — SUGAMMADEX SODIUM 200 MG/2ML IV SOLN
INTRAVENOUS | Status: DC | PRN
Start: 2015-04-18 — End: 2015-04-18
  Administered 2015-04-18: 300 mg via INTRAVENOUS

## 2015-04-18 MED ORDER — SODIUM CHLORIDE 0.9 % IV SOLN
1.0000 g | INTRAVENOUS | Status: AC
  Administered 2015-04-18: 1 g via INTRAVENOUS
  Filled 2015-04-18: qty 1

## 2015-04-18 MED ORDER — ONDANSETRON HCL 4 MG/2ML IJ SOLN
INTRAMUSCULAR | Status: DC | PRN
  Administered 2015-04-18: 4 mg via INTRAVENOUS

## 2015-04-18 MED ORDER — SUGAMMADEX SODIUM 500 MG/5ML IV SOLN
INTRAVENOUS | Status: AC
  Filled 2015-04-18: qty 5

## 2015-04-18 MED ORDER — ROCURONIUM BROMIDE 100 MG/10ML IV SOLN
INTRAVENOUS | Status: AC
  Filled 2015-04-18: qty 1

## 2015-04-18 MED ORDER — BISACODYL 10 MG RE SUPP: 10.0000 mg | Freq: Every day | RECTAL | Status: DC | PRN

## 2015-04-18 MED ORDER — MIDAZOLAM HCL 5 MG/5ML IJ SOLN
INTRAMUSCULAR | Status: DC | PRN
  Administered 2015-04-18: 2 mg via INTRAVENOUS

## 2015-04-18 MED ORDER — ONDANSETRON HCL 4 MG/2ML IJ SOLN
INTRAMUSCULAR | Status: AC
  Filled 2015-04-18: qty 2

## 2015-04-18 SURGICAL SUPPLY — 27 items
BLADE HEX COATED 2.75 (ELECTRODE) IMPLANT
BLADE SURG 15 STRL LF DISP TIS (BLADE) IMPLANT
BLADE SURG 15 STRL SS (BLADE)
BRIEF STRETCH FOR OB PAD LRG (UNDERPADS AND DIAPERS) IMPLANT
COVER SURGICAL LIGHT HANDLE (MISCELLANEOUS) IMPLANT
DRAPE LAPAROTOMY T 102X78X121 (DRAPES) IMPLANT
DRAPE SURG IRRIG POUCH 19X23 (DRAPES) ×3 IMPLANT
DRSG PAD ABDOMINAL 8X10 ST (GAUZE/BANDAGES/DRESSINGS) IMPLANT
ELECT PENCIL ROCKER SW 15FT (MISCELLANEOUS) IMPLANT
ELECT REM PT RETURN 9FT ADLT (ELECTROSURGICAL) ×3
ELECTRODE REM PT RTRN 9FT ADLT (ELECTROSURGICAL) ×1 IMPLANT
GAUZE SPONGE 4X4 12PLY STRL (GAUZE/BANDAGES/DRESSINGS) ×3 IMPLANT
GAUZE SPONGE 4X4 16PLY XRAY LF (GAUZE/BANDAGES/DRESSINGS) IMPLANT
GLOVE ECLIPSE 8.0 STRL XLNG CF (GLOVE) ×3 IMPLANT
GLOVE INDICATOR 8.0 STRL GRN (GLOVE) ×3 IMPLANT
GOWN STRL REUS W/TWL XL LVL3 (GOWN DISPOSABLE) ×9 IMPLANT
KIT BASIN OR (CUSTOM PROCEDURE TRAY) ×3 IMPLANT
LUBRICANT JELLY K Y 4OZ (MISCELLANEOUS) ×3 IMPLANT
NEEDLE HYPO 22GX1.5 SAFETY (NEEDLE) IMPLANT
PACK BASIC VI WITH GOWN DISP (CUSTOM PROCEDURE TRAY) ×3 IMPLANT
SHEET LAVH (DRAPES) ×3 IMPLANT
SPONGE LAP 18X18 X RAY DECT (DISPOSABLE) ×3 IMPLANT
SYR 20CC LL (SYRINGE) IMPLANT
SYR BULB IRRIGATION 50ML (SYRINGE) IMPLANT
TOWEL OR 17X26 10 PK STRL BLUE (TOWEL DISPOSABLE) ×3 IMPLANT
TOWEL OR NON WOVEN STRL DISP B (DISPOSABLE) ×3 IMPLANT
YANKAUER SUCT BULB TIP 10FT TU (MISCELLANEOUS) ×3 IMPLANT

## 2015-04-18 NOTE — Op Note (Signed)
04/18/2015  2:07 PM  PATIENT:  Seth Villanueva  34 y.o. male  Patient Care Team: Leonides Sake, MD as PCP - General (Family Medicine)  PRE-OPERATIVE DIAGNOSIS:  Coloanal stricture  POST-OPERATIVE DIAGNOSIS:  Coloanal stricture  PROCEDURE:  RETROGRADE COLONOSCOPY AND DILATION OF ANAL STRICTURE  Surgeon(s): [name], MD  ASSISTANT: Dr Johney Maine   ANESTHESIA:   general  SPECIMEN:  No Specimen  DISPOSITION OF SPECIMEN:  N/A  COUNTS:  YES  PLAN OF CARE: Admit for overnight observation  PATIENT DISPOSITION:  PACU - hemodynamically stable.  INDICATION: 34 y.o. 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     OR FINDINGS: coloanal stricture.  Dilated to ~2.5 cm  DESCRIPTION: the patient was identified in the preoperative holding area and taken to the OR where they were laid on the operating room table.  General anesthesia was induced without difficulty. The patient was then positioned in lithotomy position and his ostomy appliance was removed. The patient was then prepped and draped.  SCDs were noted to be in place prior to the initiation of anesthesia. A surgical timeout was performed indicating the correct patient, procedure, positioning and need for preoperative antibiotics.   I gently dilated the distal loop of the ileostomy with my finger.  I introduced a 2.8cm colonoscope into the distal end and through the ileocecal valve into the colon.  I then gently advance the scope to the end of the colon where there was an area of granulation tissue.  I then watched as Dr Johney Maine gently inserted a dilator into the rectum, trans-anally.  We then serially dilated the anal canal with enlarging dilators under direct visualization.  We dilated the area enough to get a rigid proctoscope through the stricture.  I then withdrew the colonoscope.  There were no injuries or perforations identified.  There was diversion colitis noted.  I then evaluated the dilation  digitally.  There was a small flap of tissue posteriorly that was separated from the sacral wall.  It appeared as if the posterior anastomosis had separated from the anal wall, causing the stricture.  At the end of the case, a rectal block was performed using Marcaine with epinephrine.

## 2015-04-18 NOTE — Anesthesia Procedure Notes (Signed)
Procedure Name: Intubation Date/Time: 04/18/2015 11:46 AM Performed by: Glory Buff Pre-anesthesia Checklist: Patient identified, Emergency Drugs available, Suction available and Patient being monitored Patient Re-evaluated:Patient Re-evaluated prior to inductionOxygen Delivery Method: Circle System Utilized Preoxygenation: Pre-oxygenation with 100% oxygen Intubation Type: IV induction Ventilation: Mask ventilation without difficulty Laryngoscope Size: Miller and 3 Grade View: Grade I Tube type: Oral Tube size: 7.5 mm Number of attempts: 1 Airway Equipment and Method: Stylet and Oral airway Placement Confirmation: ETT inserted through vocal cords under direct vision,  positive ETCO2 and breath sounds checked- equal and bilateral Secured at: 22 cm Tube secured with: Tape Dental Injury: Teeth and Oropharynx as per pre-operative assessment

## 2015-04-18 NOTE — Progress Notes (Signed)
Scope inserted through ileostomy stoma; retrograde colonoscopy performed; savary dilators used for anal dilation (see GI intra-op dilators note for sizes used)

## 2015-04-18 NOTE — H&P (Signed)
The patient is a 34 year old male presenting for a post-operative visit. He is doing well. He is getting his strength back. He is tolerating a diet. Denies abd pain. Denies any rectal drainage. No problems with his ostomy. 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 Compazine *ANTIPSYCHOTICS/ANTIMANIC AGENTS* anxiety  Medication History Elbert Ewings, CMA; 02/26/2015 12:01 PM) Flagyl (500MG  Tablet, 2 (two) Tablet Oral SEE NOTE, Taken starting 04/25/2014) Active. (Take at 2pm, 3pm, and 10pm the day prior to your colon operation) Neomycin Sulfate (500MG  Tablet, 2 (two) Tablet Oral SEE NOTE, Taken starting 04/25/2014) Active. (TAKE TWO TABLETS AT 2 PM, 3 PM, AND 10 PM THE DAY PRIOR TO SURGERY) OxyCODONE HCl (5MG  Tablet, Oral prn) Active. Imodium A-D (2MG  Tablet, Oral prn) Active. Mylicon (40MG /0.6ML Suspension, Oral prn) Active. Ondansetron HCl (8MG  Tablet, Oral) Active. Hydrocortisone Acetate (25MG  Suppository, Rectal prn) Active. Polyethylene Glycol 3350 (Oral prn) Active. Acetaminophen (325MG  Tablet, Oral prn) Active. Ibuprofen (200MG  Capsule, Oral prn) Active. Sudafed (30MG  Tablet, Oral prn) Active. Medications Reconciled  Social History [name], MD; 4/69/6295 12:56 PM) Alcohol use Occasional alcohol use. Tobacco use Former smoker. No caffeine use No drug use  Family History [name], MD; 2/84/1324 12:56 PM) Thyroid problems  Family Members In General. Heart disease in male family member before age 24 Hypertension Family Members In General. Breast Cancer Family Members In General. Heart Disease Family Members In General. Heart disease in male family member before age 66 Arthritis Family Members In General.  BP 132/83 mmHg  Pulse 75  Temp(Src) 98.2 F (36.8 C) (Oral)  Resp 18  Ht 6\' 2"  (1.88 m)  Wt 73.029 kg (161 lb)  BMI 20.66 kg/m2  SpO2 100%     Physical Exam [name])  General Mental Status-Alert. General Appearance-Not in acute distress. Voice-Normal.  Integumentary Global Assessment Upon inspection and palpation of skin surfaces of the - Distribution of scalp and body hair is normal. General Characteristics Overall examination of the patient's skin reveals - no rashes and no suspicious lesions.  Eye Eyeball - Left-Extraocular movements intact, No Nystagmus. Eyeball - Right-Extraocular movements intact, No Nystagmus. Upper Eyelid - Left-No Cyanotic. Upper Eyelid - Right-No Cyanotic.  Chest and Lung Exam Inspection Accessory muscles - No use of accessory muscles in breathing.  Cardiovascular Cardiovascular examination reveals -normal heart sounds, regular rate and rhythm with no murmurs.  Abdomen Inspection Skin - Inspection of the skin of the abdomen reveals - Note: Incisions healing well. Palpation/Percussion Palpation and Percussion of the abdomen reveal - Soft and Non Tender. Note: oatomy pink.  Rectal Anorectal Exam Internal - Note: Stricture noted at anastomosis, dilated manually somewhat.  Neurologic Neurologic evaluation reveals -normal attention span and ability to concentrate, able to name objects and repeat phrases. Appropriate fund of knowledge and normal coordination.  Neuropsychiatric Mental status exam performed with findings of-able to articulate well with normal speech/language, rate, volume and coherence and no evidence  of hallucinations, delusions, obsessions or homicidal/suicidal ideation. Orientation-oriented X3.  Musculoskeletal Global Assessment Gait and Station - normal gait and station.  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 He is status post ultralow anterior resection for this cancer after neoadjuvant chemotherapy. He had a diverting loop ileostomy. I was unable to obtain access to the anal canal digitally in the office after his surgery. It was suspected that he had some difficulty healing due to anal pain after surgery. I did an exam under anesthesia in the operating room approximately 3 months ago. I was able to dilate the anastomosis somewhat but able to dilate it completely and developed a rectal perforation. This has since healed. He has gotten his follow-up CT scans which show no signs of recurrence or metastases. We discussed today the next step of the procedure would be to try to get a colonoscope down his colon through his ileostomy site. Maybe at that point we could locate the other end of his colon and open this up better. We discussed the risk of this which would be damage to his ileostomy and perforation of his bowel. We discussed that even if we do find the anastomosis and dilate this, he may still stricture back down and be unable to be reversed.   We discussed the risks of bleeding and perforation.  I believe he understands these risk and is willing to undergo the procedure. We will try to get this scheduled as soon as possible.

## 2015-04-18 NOTE — Anesthesia Preprocedure Evaluation (Addendum)
Anesthesia Evaluation  Patient identified by MRN, date of birth, ID band Patient awake    Reviewed: Allergy & Precautions, H&P , NPO status , Patient's Chart, lab work & pertinent test results  History of Anesthesia Complications Negative for: history of anesthetic complications  Airway Mallampati: I  TM Distance: >3 FB Neck ROM: Full    Dental no notable dental hx. (+) Teeth Intact, Dental Advisory Given   Pulmonary former smoker,    Pulmonary exam normal breath sounds clear to auscultation       Cardiovascular negative cardio ROS Normal cardiovascular exam Rhythm:Regular Rate:Normal     Neuro/Psych PSYCHIATRIC DISORDERS Anxiety negative neurological ROS     GI/Hepatic Neg liver ROS, Hx of rectal cancer stage 3   Endo/Other  negative endocrine ROS  Renal/GU negative Renal ROS  negative genitourinary   Musculoskeletal negative musculoskeletal ROS (+)   Abdominal   Peds negative pediatric ROS (+)  Hematology negative hematology ROS (+)   Anesthesia Other Findings   Reproductive/Obstetrics negative OB ROS                            Anesthesia Physical Anesthesia Plan  ASA: III  Anesthesia Plan: General   Post-op Pain Management:    Induction: Intravenous  Airway Management Planned: Oral ETT  Additional Equipment:   Intra-op Plan:   Post-operative Plan: Extubation in OR  Informed Consent:   Plan Discussed with: Surgeon  Anesthesia Plan Comments:         Anesthesia Quick Evaluation

## 2015-04-18 NOTE — Transfer of Care (Signed)
Immediate Anesthesia Transfer of Care Note  Patient: Seth Villanueva  Procedure(s) Performed: Procedure(s): RETROGRADE COLONOSCOPY AND DILATION OF ANAL STRICTURE (N/A)  Patient Location: PACU  Anesthesia Type:General  Level of Consciousness: awake, alert  and oriented  Airway & Oxygen Therapy: Patient Spontanous Breathing and Patient connected to face mask oxygen  Post-op Assessment: Report given to RN and Post -op Vital signs reviewed and stable  Post vital signs: Reviewed and stable  Last Vitals:  Filed Vitals:   04/18/15 0842  BP: 132/83  Pulse: 75  Temp: 36.8 C  Resp: 18    Complications: No apparent anesthesia complications

## 2015-04-19 ENCOUNTER — Encounter (HOSPITAL_COMMUNITY): Payer: Self-pay | Admitting: General Surgery

## 2015-04-19 ENCOUNTER — Other Ambulatory Visit: Payer: Self-pay | Admitting: General Surgery

## 2015-04-19 MED ORDER — OXYCODONE-ACETAMINOPHEN 5-325 MG PO TABS
1.0000 | ORAL_TABLET | ORAL | Status: DC | PRN
  Administered 2015-04-19: 2 via ORAL
  Administered 2015-04-19 (×2): 1 via ORAL
  Administered 2015-04-20 – 2015-04-21 (×4): 2 via ORAL
  Filled 2015-04-19: qty 2
  Filled 2015-04-19: qty 1
  Filled 2015-04-19 (×4): qty 2
  Filled 2015-04-19: qty 1

## 2015-04-19 MED ORDER — AMOXICILLIN-POT CLAVULANATE 875-125 MG PO TABS
1.0000 | ORAL_TABLET | Freq: Two times a day (BID) | ORAL | Status: DC
  Administered 2015-04-19 – 2015-04-21 (×5): 1 via ORAL
  Filled 2015-04-19 (×7): qty 1

## 2015-04-19 NOTE — Progress Notes (Addendum)
Patient had episode of rectal bleeding, used 1/2 a pad, but some dripped on the floor.  He also had some some loose stool from his rectum.   He ambulated 1/2 the unit hall.

## 2015-04-19 NOTE — Anesthesia Postprocedure Evaluation (Signed)
  Anesthesia Post-op Note  Patient: Seth Villanueva  Procedure(s) Performed: Procedure(s) (LRB): RETROGRADE COLONOSCOPY AND DILATION OF ANAL STRICTURE (N/A)  Patient Location: PACU  Anesthesia Type: General  Level of Consciousness: awake and alert   Airway and Oxygen Therapy: Patient Spontanous Breathing  Post-op Pain: mild  Post-op Assessment: Post-op Vital signs reviewed, Patient's Cardiovascular Status Stable, Respiratory Function Stable, Patent Airway and No signs of Nausea or vomiting  Last Vitals:  Filed Vitals:   04/19/15 1400  BP: 116/62  Pulse: 88  Temp: 36.8 C  Resp: 14    Post-op Vital Signs: stable   Complications: No apparent anesthesia complications

## 2015-04-19 NOTE — Progress Notes (Signed)
1 Day Post-Op anal dilation with colonoscopy Subjective: Pt with some abd soreness and bloating.  Some nausea.  Fevers to 101.  Urinating well.     Objective: Vital signs in last 24 hours: Temp:  [97.5 F (36.4 C)-101.3 F (38.5 C)] 100 F (37.8 C) (09/09 0519) Pulse Rate:  [74-104] 98 (09/09 0519) Resp:  [8-18] 16 (09/09 0519) BP: (88-152)/(51-89) 105/57 mmHg (09/09 0519) SpO2:  [98 %-100 %] 98 % (09/09 0519) Weight:  [73.029 kg (161 lb)] 73.029 kg (161 lb) (09/08 0856)  Intake/Output from previous day: 09/08 0701 - 09/09 0700 In: 1501.7 [P.O.:120; I.V.:1381.7] Out: 1020 [Urine:600; Stool:420] Intake/Output this shift: Total I/O In: 120 [P.O.:120] Out: -   General appearance: alert and cooperative GI: normal findings: soft, mildly bloated, some tenderness to palpation in lower quadrants  Lab Results:   Recent Labs  04/18/15 0930  WBC 4.9  HGB 15.2  HCT 43.3  PLT 155   BMET No results for input(s): NA, K, CL, CO2, GLUCOSE, BUN, CREATININE, CALCIUM in the last 72 hours. PT/INR No results for input(s): LABPROT, INR in the last 72 hours. ABG No results for input(s): PHART, HCO3 in the last 72 hours.  Invalid input(s): PCO2, PO2  MEDS, Scheduled . amoxicillin-clavulanate  1 tablet Oral Q12H  . antiseptic oral rinse  7 mL Mouth Rinse BID  . enoxaparin (LOVENOX) injection  40 mg Subcutaneous Q24H  . senna-docusate  1 tablet Oral QHS    Studies/Results: No results found.  Assessment: s/p Procedure(s): RETROGRADE COLONOSCOPY AND DILATION OF ANAL STRICTURE Patient Active Problem List   Diagnosis Date Noted  . 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 Rectal cancer 02/01/2014      Plan: Advance diet as tolerated Continue fluids  Will start a round of Augmentin for 7-10 days.  Fevers most likely due to pelvic inflammation after aggressive dilation         .[name], [facility],  [place]   04/19/2015 7:58 AM

## 2015-04-20 NOTE — Progress Notes (Signed)
2 Days Post-Op anal dilation with colonoscopy Subjective: Pt with some abd soreness and bloating.  Less nausea, tolerating some liquids.  No more fevers.  Urinating ok.   UOP adequate with IV fluids  Objective: Vital signs in last 24 hours: Temp:  [98 F (36.7 C)-98.9 F (37.2 C)] 98.9 F (37.2 C) (09/10 0830) Pulse Rate:  [80-110] 97 (09/10 0830) Resp:  [14-16] 16 (09/10 0830) BP: (110-127)/(60-76) 110/60 mmHg (09/10 0830) SpO2:  [98 %-100 %] 98 % (09/10 0830)  Intake/Output from previous day: 09/09 0701 - 09/10 0700 In: 2700 [P.O.:1260; I.V.:1440] Out: 2751 [Urine:1250; KXFGH:8299] Intake/Output this shift: Total I/O In: 220 [P.O.:220] Out: -   General appearance: alert and cooperative GI: normal findings: soft, mildly bloated, some tenderness to palpation in lower quadrants  Lab Results:   Recent Labs  04/18/15 0930  WBC 4.9  HGB 15.2  HCT 43.3  PLT 155   BMET No results for input(s): NA, K, CL, CO2, GLUCOSE, BUN, CREATININE, CALCIUM in the last 72 hours. PT/INR No results for input(s): LABPROT, INR in the last 72 hours. ABG No results for input(s): PHART, HCO3 in the last 72 hours.  Invalid input(s): PCO2, PO2  MEDS, Scheduled . amoxicillin-clavulanate  1 tablet Oral Q12H  . antiseptic oral rinse  7 mL Mouth Rinse BID  . enoxaparin (LOVENOX) injection  40 mg Subcutaneous Q24H    Studies/Results: No results found.  Assessment: s/p Procedure(s): RETROGRADE COLONOSCOPY AND DILATION OF ANAL STRICTURE Patient Active Problem List   Diagnosis Date Noted  . 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 Rectal cancer 02/01/2014      Plan: Reg diet Continue fluids  Cont Augmentin for 10 days for pelvic inflammation after dilation.    LOS: 0 days     .[name], [facility], [place]   04/20/2015 10:33 AM

## 2015-04-21 MED ORDER — OXYCODONE-ACETAMINOPHEN 5-325 MG PO TABS: 1.0000 | ORAL_TABLET | ORAL | Status: DC | PRN

## 2015-04-21 MED ORDER — AMOXICILLIN-POT CLAVULANATE 875-125 MG PO TABS: 1.0000 | ORAL_TABLET | Freq: Two times a day (BID) | ORAL | Status: DC

## 2015-04-21 NOTE — Discharge Instructions (Signed)
LAPAROSCOPIC SURGERY: POST OP INSTRUCTIONS  1. DIET: Follow a light bland diet the first 24 hours after arrival home, such as soup, liquids, crackers, etc.  Be sure to include lots of fluids daily.  Avoid fast food or heavy meals as your are more likely to get nauseated.  Eat a low fat the next few days after surgery.   2. Take your usually prescribed home medications unless otherwise directed. 3. PAIN CONTROL: a. Pain is best controlled by a usual combination of three different methods TOGETHER: i. Ice/Heat ii. Over the counter pain medication iii. Prescription pain medication b. Most patients will experience some swelling and bruising around the incisions.  Ice packs or heating pads (30-60 minutes up to 6 times a day) will help. Use ice for the first few days to help decrease swelling and bruising, then switch to heat to help relax tight/sore spots and speed recovery.  Some people prefer to use ice alone, heat alone, alternating between ice & heat.  Experiment to what works for you.  Swelling and bruising can take several weeks to resolve.   c. It is helpful to take an over-the-counter pain medication regularly for the first few weeks.  Choose one of the following that works best for you: i. Naproxen (Aleve, etc)  Two 220mg  tabs twice a day ii. Ibuprofen (Advil, etc) Three 200mg  tabs four times a day (every meal & bedtime) d. A  prescription for pain medication (such as percocet, vicodin, oxycodone, hydrocodone, etc) should be given to you upon discharge.  Take your pain medication as prescribed.  i. If you are having problems/concerns with the prescription medicine (does not control pain, nausea, vomiting, rash, itching, etc), please call us [phone] to see if we need to switch you to a different pain medicine that will work better for you and/or control your side effect better. ii. If you need a refill on your pain medication, please contact your pharmacy.  They will contact our office to  request authorization. Prescriptions will not be filled after 5 pm or on week-ends. 4. ACTIVITIES as tolerated:   a. You may resume regular (light) daily activities beginning the next day--such as daily self-care, walking, climbing stairs--gradually increasing activities as tolerated.  If you can walk 30 minutes without difficulty, it is safe to try more intense activity such as jogging, treadmill, bicycling, low-impact aerobics, swimming, etc. b. Save the most intensive and strenuous activity for last such as sit-ups, heavy lifting, contact sports, etc  Refrain from any heavy lifting or straining until you are off narcotics for pain control.   c. DO NOT PUSH THROUGH PAIN.  Let pain be your guide: If it hurts to do something, don't do it.  Pain is your body warning you to avoid that activity for another week until the pain goes down. d. You may drive when you are no longer taking prescription pain medication, you can comfortably wear a seatbelt, and you can safely maneuver your car and apply brakes. e. Dennis Bast may have sexual intercourse when it is comfortable.  5. FOLLOW UP in our office a. Please call [facility] at [phone] to set up an appointment to see your surgeon in the office for a follow-up appointment approximately 2-3 weeks after your surgery. b. Make sure that you call for this appointment the day you arrive home to insure a convenient appointment time. 10. IF YOU HAVE DISABILITY OR FAMILY LEAVE FORMS, BRING THEM TO THE OFFICE FOR PROCESSING.  DO NOT GIVE  THEM TO YOUR DOCTOR.   WHEN TO CALL us [phone]: 1. Poor pain control 2. Reactions / problems with new medications (rash/itching, nausea, etc)  3. Fever over 101.5 F (38.5 C) 4. Inability to urinate 5. Nausea and/or vomiting 6. Worsening swelling or bruising 7. Continued bleeding from incision. 8. Increased pain, redness, or drainage from the incision   The clinic staff is available to answer your questions during regular  business hours (8:30am-5pm).  Please dont hesitate to call and ask to speak to one of our nurses for clinical concerns.   If you have a medical emergency, go to the nearest emergency room or call 911.  A surgeon from [facility] is always on call at the [facility], [place], [place], [place], [place]  [address] ? MAIN: [phone] ? TOLL FREE: [phone] ?  FAX [phone] [email or web address]

## 2015-04-21 NOTE — Discharge Summary (Signed)
Physician Discharge Summary  Patient ID: Seth Villanueva MRN: [id number] DOB/AGE: Mar 18, 1981 34 y.o.  Admit date: 04/18/2015 Discharge date: 04/21/2015  Admission Diagnoses: Anal stricture  Discharge Diagnoses:  Active Problems:   Anal stricture   Discharged Condition: good  Hospital Course: 34 y.o. 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  He underwent an endoscopically directed anal dilation.  He was admitted to the floor.  He had some post op fevers and abd distention.  There was concern for a small perforation.  He was placed on antibiotics.  His abd pain and ileus resolved.  He was discharged to home once tolerating a PO diet and having no new fevers.    Consults: None  Significant Diagnostic Studies: none  Treatments: antibiotics: Augmentin  Discharge Exam: Blood pressure 102/56, pulse 99, temperature 100.8 F (38.2 C), temperature source Oral, resp. rate 16, height 6\' 2"  (1.88 m), weight 73.029 kg (161 lb), SpO2 99 %. General appearance: alert and cooperative GI: soft, less distended  Disposition: 01-Home or Self Care     Medication List    TAKE these medications        acetaminophen 325 MG tablet  Commonly known as:  TYLENOL  Take 325 mg by mouth once as needed for mild pain (pain).     amoxicillin-clavulanate 875-125 MG per tablet  Commonly known as:  AUGMENTIN  Take 1 tablet by mouth every 12 (twelve) hours.     IMODIUM A-D PO  Take 2 mg by mouth daily as needed (loose stool).     lidocaine-prilocaine cream  Commonly known as:  EMLA  Apply 1 application topically as needed. Apply to [facility] site 1-2 hours prior to stick and cover with plastic wrap to numb site     NON FORMULARY  Take 2 tablets by mouth 2 (two) times daily. 1-3D Beta Glucan - Immune modulator     ondansetron 8 MG tablet  Commonly known as:  ZOFRAN  Take 1 tablet (8 mg total) by mouth every 8 (eight) hours as needed for nausea or vomiting.     oxyCODONE-acetaminophen 5-325 MG per  tablet  Commonly known as:  PERCOCET/ROXICET  Take 1-2 tablets by mouth every 4 (four) hours as needed for moderate pain.           Follow-up Information    Follow up with [name]., MD.   Specialty:  General Surgery   Why:  we will contact you next week   Contact information:   [place] [address] [phone]       Signed: [name] 0/02/6225, 3:33 AM

## 2015-04-22 ENCOUNTER — Inpatient Hospital Stay (HOSPITAL_COMMUNITY)
Admission: EM | Admit: 2015-04-22 | Discharge: 2015-04-26 | DRG: 394 | Disposition: A | Discharge status: Home / Self Care | Payer: 59 | Attending: General Surgery | Admitting: General Surgery

## 2015-04-22 ENCOUNTER — Emergency Department (HOSPITAL_COMMUNITY): Payer: 59

## 2015-04-22 ENCOUNTER — Encounter (HOSPITAL_COMMUNITY): Payer: Self-pay

## 2015-04-22 DIAGNOSIS — K913 Postprocedural intestinal obstruction: Secondary | ICD-10-CM | POA: Diagnosis not present

## 2015-04-22 DIAGNOSIS — C20 Malignant neoplasm of rectum: Secondary | ICD-10-CM | POA: Diagnosis present

## 2015-04-22 DIAGNOSIS — R188 Other ascites: Secondary | ICD-10-CM | POA: Diagnosis not present

## 2015-04-22 DIAGNOSIS — R112 Nausea with vomiting, unspecified: Secondary | ICD-10-CM

## 2015-04-22 DIAGNOSIS — I4510 Unspecified right bundle-branch block: Secondary | ICD-10-CM | POA: Diagnosis present

## 2015-04-22 DIAGNOSIS — E860 Dehydration: Secondary | ICD-10-CM | POA: Diagnosis not present

## 2015-04-22 DIAGNOSIS — G620 Drug-induced polyneuropathy: Secondary | ICD-10-CM | POA: Diagnosis present

## 2015-04-22 DIAGNOSIS — K624 Stenosis of anus and rectum: Secondary | ICD-10-CM | POA: Diagnosis present

## 2015-04-22 DIAGNOSIS — Z79891 Long term (current) use of opiate analgesic: Secondary | ICD-10-CM | POA: Diagnosis not present

## 2015-04-22 DIAGNOSIS — Z888 Allergy status to other drugs, medicaments and biological substances status: Secondary | ICD-10-CM | POA: Diagnosis not present

## 2015-04-22 DIAGNOSIS — Z9049 Acquired absence of other specified parts of digestive tract: Secondary | ICD-10-CM | POA: Diagnosis present

## 2015-04-22 DIAGNOSIS — Z79899 Other long term (current) drug therapy: Secondary | ICD-10-CM | POA: Diagnosis not present

## 2015-04-22 DIAGNOSIS — K56609 Unspecified intestinal obstruction, unspecified as to partial versus complete obstruction: Secondary | ICD-10-CM

## 2015-04-22 DIAGNOSIS — Z923 Personal history of irradiation: Secondary | ICD-10-CM | POA: Diagnosis not present

## 2015-04-22 DIAGNOSIS — T451X5A Adverse effect of antineoplastic and immunosuppressive drugs, initial encounter: Secondary | ICD-10-CM | POA: Diagnosis present

## 2015-04-22 DIAGNOSIS — Z9221 Personal history of antineoplastic chemotherapy: Secondary | ICD-10-CM | POA: Diagnosis not present

## 2015-04-22 DIAGNOSIS — Z881 Allergy status to other antibiotic agents status: Secondary | ICD-10-CM | POA: Diagnosis not present

## 2015-04-22 DIAGNOSIS — R5082 Postprocedural fever: Secondary | ICD-10-CM | POA: Diagnosis not present

## 2015-04-22 DIAGNOSIS — Z932 Ileostomy status: Secondary | ICD-10-CM | POA: Diagnosis not present

## 2015-04-22 DIAGNOSIS — Z87891 Personal history of nicotine dependence: Secondary | ICD-10-CM | POA: Diagnosis not present

## 2015-04-22 LAB — CBC WITH DIFFERENTIAL/PLATELET
Basophils Absolute: 0 10*3/uL (ref 0.0–0.1)
Basophils Relative: 0 % (ref 0–1)
Eosinophils Absolute: 0 10*3/uL (ref 0.0–0.7)
Eosinophils Relative: 0 % (ref 0–5)
HCT: 44 % (ref 39.0–52.0)
Hemoglobin: 15.6 g/dL (ref 13.0–17.0)
Lymphocytes Relative: 5 % — ABNORMAL LOW (ref 12–46)
Lymphs Abs: 0.5 10*3/uL — ABNORMAL LOW (ref 0.7–4.0)
MCH: 31.9 pg (ref 26.0–34.0)
MCHC: 35.5 g/dL (ref 30.0–36.0)
MCV: 90 fL (ref 78.0–100.0)
Monocytes Absolute: 1.3 10*3/uL — ABNORMAL HIGH (ref 0.1–1.0)
Monocytes Relative: 13 % — ABNORMAL HIGH (ref 3–12)
Neutro Abs: 8.5 10*3/uL — ABNORMAL HIGH (ref 1.7–7.7)
Neutrophils Relative %: 82 % — ABNORMAL HIGH (ref 43–77)
Platelets: 160 10*3/uL (ref 150–400)
RBC: 4.89 MIL/uL (ref 4.22–5.81)
RDW: 13.7 % (ref 11.5–15.5)
WBC: 10.4 10*3/uL (ref 4.0–10.5)

## 2015-04-22 LAB — BASIC METABOLIC PANEL
Anion gap: 14 (ref 5–15)
BUN: 14 mg/dL (ref 6–20)
CO2: 26 mmol/L (ref 22–32)
Calcium: 9.4 mg/dL (ref 8.9–10.3)
Chloride: 96 mmol/L — ABNORMAL LOW (ref 101–111)
Creatinine, Ser: 0.92 mg/dL (ref 0.61–1.24)
GFR calc Af Amer: >60 mL/min (ref >60)
GFR calc non Af Amer: >60 mL/min (ref >60)
Glucose, Bld: 120 mg/dL — ABNORMAL HIGH (ref 65–99)
Potassium: 3.3 mmol/L — ABNORMAL LOW (ref 3.5–5.1)
Sodium: 136 mmol/L (ref 135–145)

## 2015-04-22 MED ORDER — DIPHENHYDRAMINE HCL 50 MG/ML IJ SOLN
12.5000 mg | Freq: Once | INTRAMUSCULAR | Status: AC
  Administered 2015-04-22: 12.5 mg via INTRAVENOUS
  Filled 2015-04-22: qty 1

## 2015-04-22 MED ORDER — MORPHINE SULFATE (PF) 2 MG/ML IV SOLN
1.0000 mg | INTRAVENOUS | Status: DC | PRN
  Administered 2015-04-23 (×3): 2 mg via INTRAVENOUS
  Filled 2015-04-22 (×4): qty 1

## 2015-04-22 MED ORDER — IOHEXOL 300 MG/ML  SOLN
100.0000 mL | Freq: Once | INTRAMUSCULAR | Status: AC | PRN
  Administered 2015-04-22: 100 mL via INTRAVENOUS

## 2015-04-22 MED ORDER — METOCLOPRAMIDE HCL 5 MG/ML IJ SOLN
10.0000 mg | Freq: Once | INTRAMUSCULAR | Status: AC
  Administered 2015-04-22: 10 mg via INTRAVENOUS
  Filled 2015-04-22: qty 2

## 2015-04-22 MED ORDER — ONDANSETRON 4 MG PO TBDP: 4.0000 mg | ORAL_TABLET | Freq: Four times a day (QID) | ORAL | Status: DC | PRN

## 2015-04-22 MED ORDER — IOHEXOL 300 MG/ML  SOLN: 25.0000 mL | INTRAMUSCULAR | Status: DC

## 2015-04-22 MED ORDER — KCL IN DEXTROSE-NACL 20-5-0.45 MEQ/L-%-% IV SOLN
INTRAVENOUS | Status: DC
  Administered 2015-04-23 (×3): via INTRAVENOUS
  Administered 2015-04-24 (×2): 125 mL/h via INTRAVENOUS
  Filled 2015-04-22 (×11): qty 1000

## 2015-04-22 MED ORDER — SODIUM CHLORIDE 0.9 % IV BOLUS (SEPSIS)
1000.0000 mL | Freq: Once | INTRAVENOUS | Status: AC
  Administered 2015-04-22: 1000 mL via INTRAVENOUS

## 2015-04-22 MED ORDER — MORPHINE SULFATE (PF) 4 MG/ML IV SOLN
6.0000 mg | Freq: Once | INTRAVENOUS | Status: AC
  Administered 2015-04-22: 6 mg via INTRAVENOUS
  Filled 2015-04-22: qty 2

## 2015-04-22 MED ORDER — ONDANSETRON HCL 4 MG/2ML IJ SOLN
4.0000 mg | Freq: Four times a day (QID) | INTRAMUSCULAR | Status: DC | PRN
  Administered 2015-04-23 – 2015-04-25 (×6): 4 mg via INTRAVENOUS
  Filled 2015-04-22 (×6): qty 2

## 2015-04-22 MED ORDER — SODIUM CHLORIDE 0.9 % IV SOLN
8.0000 mg | Freq: Once | INTRAVENOUS | Status: AC
  Administered 2015-04-22: 8 mg via INTRAVENOUS
  Filled 2015-04-22: qty 4

## 2015-04-22 MED ORDER — HEPARIN SODIUM (PORCINE) 5000 UNIT/ML IJ SOLN
5000.0000 [IU] | Freq: Three times a day (TID) | INTRAMUSCULAR | Status: DC
  Administered 2015-04-23 – 2015-04-26 (×12): 5000 [IU] via SUBCUTANEOUS
  Filled 2015-04-22 (×14): qty 1

## 2015-04-22 NOTE — ED Provider Notes (Signed)
CSN: [id number]     Arrival date & time 04/22/15  1306 History   First MD Initiated Contact with Patient 04/22/15 1329     Chief Complaint  Patient presents with  . Emesis  . Post-op Problem     (Consider location/radiation/quality/duration/timing/severity/associated sxs/prior Treatment) HPI   34yM with n/v. Hx of distal rectal cancer. He is status post resection for this cancer and a diverting loop ileostomy 05/2014. This past Thursday had retrograde colonoscopy and dilation of anal stricture. Discharged yesterday. Shortly after coming home, he began having increasing n/v. Minimal pain. Having output from ostomy. No continued fever. Urine output mildy decreased, otherwise no urinary complaints. Tried zofran w/o much relief.    Past Medical History  Diagnosis Date  . S/P radiation therapy 02/19/14-03/28/14    rectal  50.4Gy  . History of cancer chemotherapy     completed 09-03-2014  . Anastomotic stricture of colorectal region   . Chemotherapy-induced neuropathy   . Incomplete right bundle branch block   . S/P ileostomy   . Wears glasses   . Rectal cancer onocologist-  dr Benay Spice    dx 01-31-2015--  Stage III (ypT2, ypN2) invasive--  s/p  radiation (ended 03-28-2014) and chemo (complete 09-03-2014) and low anterior colon resection with ileostomy 05-18-2014  . History of phlebitis     08-20-2014---- RIGHT UPPER INNER ARM--  RESOLVED    Past Surgical History  Procedure Laterality Date  . Wisdom tooth extraction  age 34  . Eus N/A 01/30/2014    Procedure: LOWER ENDOSCOPIC ULTRASOUND (EUS);  Surgeon: Arta Silence, MD;  Location[facility] END[facility]COPY;  Service: Endoscopy;  Laterality: N/A;  with biospy's  . Colonoscopy w/ biopsies  01/22/2014    fungating nonobstructing large mass distal rectum  . Portacath placement Left 05/31/2014    Procedure: INSERTION PORT-A-CATH LET SUBCLAVIAN;  Surgeon: [name], MD;  Location: [facility];  Service: General;  Laterality: Left;  . Robot assisted low  anterior colon resection with flexure mobilization /  hand sewn colo-anal anastomiosis/  diverting loop ileostomy  05-18-2014  . Rectal exam under anesthesia N/A 11/15/2014    Procedure: ANAL EXAM UNDER ANESTHESIA, RIGID PROTOSCOPY;  Surgeon: [name], MD;  Location: [facility];  Service: General;  Laterality: N/A;  . Flexible sigmoidoscopy N/A 11/15/2014    Procedure: FLEXIBLE SIGMOIDOSCOPY;  Surgeon: [name], MD;  Location: [facility];  Service: General;  Laterality: N/A;  . Repair of rectal prolapse N/A 04/18/2015    Procedure: RETROGRADE COLONOSCOPY AND DILATION OF ANAL STRICTURE;  Surgeon: [name], MD;  Location: [facility];  Service: General;  Laterality: N/A;   Family History  Problem Relation Age of Onset  . Breast cancer Maternal Aunt     pat mat great aunt; currently 28  . Leukemia Maternal Aunt     pat mat great aunt; deceased 24s  . Other Paternal Aunt     colon polyps (type/#?)   Social History  Substance Use Topics  . Smoking status: Former Smoker -- 1.00 packs/day for 5 years    Types: Cigarettes    Quit date: 02/02/2012  . Smokeless tobacco: Former Systems developer    Types: Snuff    Quit date: 02/01/2006  . Alcohol Use: No    Review of Systems  All systems reviewed and negative, other than as noted in HPI.   Allergies  Zithromax and Compazine  Home Medications   Prior to Admission medications   Medication Sig Start Date End Date Taking?  Authorizing Provider  acetaminophen (TYLENOL) 325 MG tablet Take 325 mg by mouth once as needed for mild pain (pain).    Yes Historical Provider, MD  amoxicillin-clavulanate (AUGMENTIN) 875-125 MG per tablet Take 1 tablet by mouth every 12 (twelve) hours. 3/47/42  Yes [name], MD  Loperamide HCl (IMODIUM A-D PO) Take 2 mg by mouth daily as needed (loose stool).    Yes Historical Provider, MD  NON FORMULARY Take 2 tablets by mouth 2 (two) times daily. 1-3D Beta Glucan - Immune modulator   Yes  Historical Provider, MD  oxyCODONE-acetaminophen (PERCOCET/ROXICET) 5-325 MG per tablet Take 1-2 tablets by mouth every 4 (four) hours as needed for moderate pain. 5/95/63  Yes [name], MD  pseudoephedrine (SUDAFED) 60 MG tablet Take 60 mg by mouth every 4 (four) hours as needed for congestion.   Yes Historical Provider, MD  lidocaine-prilocaine (EMLA) cream Apply 1 application topically as needed. Apply to [facility] site 1-2 hours prior to stick and cover with plastic wrap to numb site Patient not taking: Reported on 02/01/2015 05/28/14   Ladell Pier, MD  ondansetron (ZOFRAN) 8 MG tablet Take 1 tablet (8 mg total) by mouth every 8 (eight) hours as needed for nausea or vomiting. Patient not taking: Reported on 02/01/2015 08/06/14   Ladell Pier, MD   BP 126/88 mmHg  Pulse 91  Temp(Src) 98.5 F (36.9 C) (Oral)  Resp 18  SpO2 100% Physical Exam  Constitutional: He appears well-developed and well-nourished. No distress.  HENT:  Head: Normocephalic and atraumatic.  Eyes: Conjunctivae are normal. Right eye exhibits no discharge. Left eye exhibits no discharge.  Neck: Neck supple.  Cardiovascular: Normal rate, regular rhythm and normal heart sounds.  Exam reveals no gallop and no friction rub.   No murmur heard. Pulmonary/Chest: Effort normal and breath sounds normal. No respiratory distress.  Abdominal: Soft. He exhibits no distension.  Ostomy with air and watery output. Mild distension. Minimal diffuse tenderness.   Musculoskeletal: He exhibits no edema or tenderness.  Skin: Skin is warm and dry.  Psychiatric: He has a normal mood and affect. His behavior is normal. Thought content normal.  Nursing note and vitals reviewed.   ED Course  Procedures (including critical care time) Labs Review Labs Reviewed  BASIC METABOLIC PANEL - Abnormal; Notable for the following:    Potassium 3.3 (*)    Chloride 96 (*)    Glucose, Bld 120 (*)    All other components within normal limits  CBC  WITH DIFFERENTIAL/PLATELET - Abnormal; Notable for the following:    Neutrophils Relative % 82 (*)    Neutro Abs 8.5 (*)    Lymphocytes Relative 5 (*)    Lymphs Abs 0.5 (*)    Monocytes Relative 13 (*)    Monocytes Absolute 1.3 (*)    All other components within normal limits    Imaging Review Dg Abd Acute W/chest  04/22/2015   CLINICAL DATA:  Nausea and vomiting for 1 day. History of rectal carcinoma.  EXAM: DG ABDOMEN ACUTE W/ 1V CHEST  COMPARISON:  CT, 01/31/2015  FINDINGS: There is dilated small bowel with air-fluid levels. Air is seen within a nondistended right colon with no significant left colon or rectal air. There is no free air.  Abdominal pelvic soft tissues are unremarkable.  Heart, mediastinum and hila are unremarkable. Lungs are clear. No pleural effusion or pneumothorax. Left anterior chest wall Port-A-Cath has its tip in the lower superior vena cava.  Skeletal structures are  unremarkable.  IMPRESSION: 1. Findings are consistent with a partial small bowel obstruction. 2. No free air. 3. No acute cardiopulmonary disease.   Electronically Signed   By: Lajean Manes M.D.   On: 04/22/2015 15:09   I have personally reviewed and evaluated these images and lab results as part of my medical decision-making.   EKG Interpretation None      MDM   Final diagnoses:  Nausea and vomiting   34yM with n/v. Hx of rectal CA s/p resection with diverting ileostomy and recent dilation of rectal stricture. Is somewhat distended on exam but has output into bag. Plain films concerning for partial SBO. Discussed with Dr Hassell Done, surgery. Recommending continued fluid and symptomatic tx for possible discharge, slow progression of diet and follow-up in surgery office.     Pt continues to remain symptomatic. No vomiting in ED but continues to hiccup and reports he does not feel significantly better. Will continue to treat symptoms. Will CT. May need admission if symptoms do not significantly improve of  CT shows significant abnormality.     Virgel Manifold, MD 04/22/15 1700

## 2015-04-22 NOTE — H&P (Addendum)
Re:   Seth Villanueva DOB:   05/02/1981 MRN:   [id number]  [facility] admission  ASSESSMENT AND PLAN: 1.  Partial SBO  To admit, give IVF, recheck KUB in AM.  He said that this happens when he gets dehydrated.    2   Rectal cancer   Stage III - ypT2, ypN2  4.3 cm invasive adenoca, G2, 4/15 nodes  3.  Pelvic fluid collection  This is new since 01/31/2015 CT scan.  But the patient is not acting as if this an abscess.  4.  S/P LAR, loop ileostomy - 05/18/2014 - Seth Villanueva  She is planning reversal of ileostomy later this month. 5.  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 Emesis  . Post-op Problem   REFERRING PHYSICIAN:   Virgel Manifold, MD. Seth Villanueva  HISTORY OF PRESENT ILLNESS: Seth Villanueva is a 34 y.o. (DOB: 02/25/1981)  white male whose primary care physician is Seth L, MD (he is looking for another PCP - he does not have one) and comes to the [facility] ER today for nausea and vomiting..  Seth Villanueva was diagnosed with rectal cancer in June 2015.  He received neoadjuvant radiation and Xeloda. He underwent a ultra LAR on 05/18/2014 by Seth Villanueva.  He has a diverting loop ileostomy.     He had adjuvant chemotherapy with FOLFOX supervised by Seth Villanueva. Seth Villanueva.  He has some problems with a stricture at the colo-anal anastomosis. He had an anal stricture dilated 11/15/2014 by Seth Villanueva.  He was seen by Seth Villanueva in our office (02/26/2015) and underwent another dilitation by Seth Villanueva on 04/18/2015.  He was just discharged home from this last procedure yesterday and he was doing well at the time.  But last night, he developed "sinus" drainage, hiccups, and then started vomiting.  His mother spoke to our office today.  And she brought him to the [facility] ER.  His ostomy has continued to function, though it is unclear to me whether his output is decreased.  He underwent a CT scan of the abdomen - 04/22/2015 - 1. High-grade small bowel  obstruction, best localized to the right lower quadrant where there are numerous small bowel loops with thickened and enhancing wall. Point of obstruction is felt to be proximal to the ileostomy.  2. Small bowel loops distal to the ileostomy are relatively normal in appearance.  3. Distal colon is collapsed.  4. Loculated ascites associated with enhancing peritoneum. Findings are consistent with abscess or peritoneal based metastases. Stranding in the left lower quadrant, suspicious for omental/peritoneal based metastases.  5. Thickening of the presacral fat may be related to post treatment changes.  WBC - 10,400 - 04/22/2015   Past Medical History  Diagnosis Date  . S/P radiation therapy 02/19/14-03/28/14    rectal  50.4Gy  . History of cancer chemotherapy     completed 09-03-2014  . Anastomotic stricture of colorectal region   . Chemotherapy-induced neuropathy   . Incomplete right bundle branch block   . S/P ileostomy   . Wears glasses   . Rectal cancer onocologist-  Seth Villanueva    dx 01-31-2015--  Stage III (ypT2, ypN2) invasive--  s/p  radiation (ended 03-28-2014) and chemo (complete 09-03-2014) and low anterior colon resection with ileostomy 05-18-2014  . History of phlebitis     08-20-2014---- RIGHT UPPER INNER ARM--  RESOLVED       Past Surgical History  Procedure Laterality  Date  . Wisdom tooth extraction  age 20  . Eus N/A 01/30/2014    Procedure: LOWER ENDOSCOPIC ULTRASOUND (EUS);  Surgeon: Arta Silence, MD;  Location: [facility] ENDOSCOPY;  Service: Endoscopy;  Laterality: N/A;  with biospy's  . Colonoscopy w/ biopsies  01/22/2014    fungating nonobstructing large mass distal rectum  . Portacath placement Left 05/31/2014    Procedure: INSERTION PORT-A-CATH LET SUBCLAVIAN;  Surgeon: [name], MD;  Location: [facility];  Service: General;  Laterality: Left;  . Robot assisted low anterior colon resection with flexure mobilization /  hand sewn colo-anal anastomiosis/  diverting loop  ileostomy  05-18-2014  . Rectal exam under anesthesia N/A 11/15/2014    Procedure: ANAL EXAM UNDER ANESTHESIA, RIGID PROTOSCOPY;  Surgeon: [name], MD;  Location: [facility];  Service: General;  Laterality: N/A;  . Flexible sigmoidoscopy N/A 11/15/2014    Procedure: FLEXIBLE SIGMOIDOSCOPY;  Surgeon: [name], MD;  Location: [facility];  Service: General;  Laterality: N/A;  . Repair of rectal prolapse N/A 04/18/2015    Procedure: RETROGRADE COLONOSCOPY AND DILATION OF ANAL STRICTURE;  Surgeon: [name], MD;  Location: [facility];  Service: General;  Laterality: N/A;      No current facility-administered medications for this encounter.   Current Outpatient Prescriptions  Medication Sig Dispense Refill  . acetaminophen (TYLENOL) 325 MG tablet Take 325 mg by mouth once as needed for mild pain (pain).     Marland Kitchen amoxicillin-clavulanate (AUGMENTIN) 875-125 MG per tablet Take 1 tablet by mouth every 12 (twelve) hours. 14 tablet 0  . Loperamide HCl (IMODIUM A-D PO) Take 2 mg by mouth daily as needed (loose stool).     . NON FORMULARY Take 2 tablets by mouth 2 (two) times daily. 1-3D Beta Glucan - Immune modulator    . oxyCODONE-acetaminophen (PERCOCET/ROXICET) 5-325 MG per tablet Take 1-2 tablets by mouth every 4 (four) hours as needed for moderate pain. 30 tablet 0  . pseudoephedrine (SUDAFED) 60 MG tablet Take 60 mg by mouth every 4 (four) hours as needed for congestion.    . lidocaine-prilocaine (EMLA) cream Apply 1 application topically as needed. Apply to [facility] site 1-2 hours prior to stick and cover with plastic wrap to numb site (Patient not taking: Reported on 02/01/2015) 30 g 11  . ondansetron (ZOFRAN) 8 MG tablet Take 1 tablet (8 mg total) by mouth every 8 (eight) hours as needed for nausea or vomiting. (Patient not taking: Reported on 02/01/2015) 30 tablet 1      Allergies  Allergen Reactions  . Zithromax [Azithromycin] Hives  . Compazine [Prochlorperazine]  Anxiety    REVIEW OF SYSTEMS: Skin:  No history of rash.  No history of abnormal moles. Infection:  No history of hepatitis or HIV.  No history of MRSA. Neurologic:  No history of stroke.  No history of seizure.  No history of headaches. Cardiac:  No history of hypertension. No history of heart disease.  No history of prior cardiac catheterization.  No history of seeing a cardiologist. Pulmonary:  Quit smoking when he found out his diagnosis of rectal ca.  Endocrine:  No diabetes. No thyroid disease. Gastrointestinal:  No history of stomach disease.  No history of liver disease.  No history of gall bladder disease.  No history of pancreas disease.  No history of colon disease. Urologic:  No history of kidney stones.  No history of bladder infections. Musculoskeletal:  No history of joint or back disease. Hematologic:  No  bleeding disorder.  No history of anemia.  Not anticoagulated. Psycho-social:  The patient is oriented.   The patient has no obvious psychologic or social impairment to understanding our conversation and plan.  SOCIAL and FAMILY HISTORY: Unmarried. Worked at [employer], but has been released.  So he is not working now. Lives with parents.  PHYSICAL EXAM: BP 121/78 mmHg  Pulse 83  Temp(Src) 98.5 F (36.9 C) (Oral)  Resp 20  SpO2 95%  General: WN WM who is alert and generally healthy appearing.  HEENT: Normal. Pupils equal. Neck: Supple. No mass.  No thyroid mass. Lymph Nodes:  No supraclavicular or cervical nodes. Lungs: Clear to auscultation and symmetric breath sounds.  Port in left upper chest. Heart:  RRR. No murmur or rub. Abdomen: No mass. No tenderness.  Ileostomy in RLQ.  There are ileostomy contents in the bag.  He is moderately distended. Rectal: Not done. Extremities:  Good strength and ROM  in upper and lower extremities. Psychiatric: Behavior is normal.   DATA REVIEWED: Epic notes.  Alphonsa Overall, MD,  [facility], [address]  [place], [place]    [place] Phone:  [phone] FAX:  [phone]

## 2015-04-22 NOTE — ED Notes (Signed)
MD at bedside. 

## 2015-04-22 NOTE — ED Notes (Signed)
Pt has hx of cancer.  D/C home on 11th from dilation procedure to rectum and ileostomy.  Pt unable to keep anything down. Not taking his meds.  Pt was having fevers.

## 2015-04-23 ENCOUNTER — Inpatient Hospital Stay (HOSPITAL_COMMUNITY): Payer: 59

## 2015-04-23 LAB — BASIC METABOLIC PANEL
ANION GAP: 8 (ref 5–15)
BUN: 11 mg/dL (ref 6–20)
CALCIUM: 8.5 mg/dL — AB (ref 8.9–10.3)
CO2: 27 mmol/L (ref 22–32)
CREATININE: 1.01 mg/dL (ref 0.61–1.24)
Chloride: 100 mmol/L — ABNORMAL LOW (ref 101–111)
Glucose, Bld: 131 mg/dL — ABNORMAL HIGH (ref 65–99)
Potassium: 3.5 mmol/L (ref 3.5–5.1)
Sodium: 135 mmol/L (ref 135–145)

## 2015-04-23 LAB — CBC
HCT: 37.8 % — ABNORMAL LOW (ref 39.0–52.0)
HEMOGLOBIN: 13.2 g/dL (ref 13.0–17.0)
MCH: 31.9 pg (ref 26.0–34.0)
MCHC: 34.9 g/dL (ref 30.0–36.0)
MCV: 91.3 fL (ref 78.0–100.0)
PLATELETS: 156 10*3/uL (ref 150–400)
RBC: 4.14 MIL/uL — AB (ref 4.22–5.81)
RDW: 14.1 % (ref 11.5–15.5)
WBC: 6.6 10*3/uL (ref 4.0–10.5)

## 2015-04-23 MED ORDER — METRONIDAZOLE IN NACL 5-0.79 MG/ML-% IV SOLN
500.0000 mg | Freq: Four times a day (QID) | INTRAVENOUS | Status: DC
  Administered 2015-04-23 – 2015-04-26 (×12): 500 mg via INTRAVENOUS
  Filled 2015-04-23 (×16): qty 100

## 2015-04-23 MED ORDER — OXYCODONE-ACETAMINOPHEN 7.5-325 MG PO TABS
1.0000 | ORAL_TABLET | ORAL | Status: DC | PRN
  Filled 2015-04-23: qty 2

## 2015-04-23 MED ORDER — CIPROFLOXACIN IN D5W 400 MG/200ML IV SOLN
400.0000 mg | Freq: Two times a day (BID) | INTRAVENOUS | Status: DC
  Administered 2015-04-23 – 2015-04-25 (×6): 400 mg via INTRAVENOUS
  Filled 2015-04-23 (×8): qty 200

## 2015-04-23 MED ORDER — OXYCODONE-ACETAMINOPHEN 5-325 MG PO TABS
1.0000 | ORAL_TABLET | ORAL | Status: DC | PRN
  Administered 2015-04-23 – 2015-04-24 (×3): 2 via ORAL
  Administered 2015-04-25 – 2015-04-26 (×5): 1 via ORAL
  Filled 2015-04-23: qty 1
  Filled 2015-04-23 (×3): qty 2
  Filled 2015-04-23 (×4): qty 1

## 2015-04-23 NOTE — Progress Notes (Signed)
anal dilation with colonoscopy Subjective: Pt readmitted for nausea and dehydration.  Having ostomy output.  Denies any pain. CT shows inflammatory ascites and ileus.    Objective: Vital signs in last 24 hours: Temp:  [98.5 F (36.9 C)-99.4 F (37.4 C)] 99.4 F (37.4 C) (09/13 0500) Pulse Rate:  [81-102] 82 (09/13 0500) Resp:  [18-20] 18 (09/13 0500) BP: (93-135)/(59-94) 93/59 mmHg (09/13 0500) SpO2:  [93 %-100 %] 97 % (09/13 0500) Weight:  [72.576 kg (160 lb)] 72.576 kg (160 lb) (09/13 0000)  Intake/Output from previous day: 09/12 0701 - 09/13 0700 In: 857.5 [I.V.:857.5] Out: -  Intake/Output this shift:    General appearance: alert and cooperative GI: normal findings: soft, mildly bloated, no tenderness to palpation  Lab Results:   Recent Labs  04/22/15 1413 04/23/15 0536  WBC 10.4 6.6  HGB 15.6 13.2  HCT 44.0 37.8*  PLT 160 156   BMET  Recent Labs  04/22/15 1413 04/23/15 0536  NA 136 135  K 3.3* 3.5  CL 96* 100*  CO2 26 27  GLUCOSE 120* 131*  BUN 14 11  CREATININE 0.92 1.01  CALCIUM 9.4 8.5*   PT/INR No results for input(s): LABPROT, INR in the last 72 hours. ABG No results for input(s): PHART, HCO3 in the last 72 hours.  Invalid input(s): PCO2, PO2  MEDS, Scheduled . ciprofloxacin  400 mg Intravenous Q12H  . heparin  5,000 Units Subcutaneous 3 times per day  . metronidazole  500 mg Intravenous Q6H    Studies/Results: Ct Abdomen Pelvis W Contrast  04/22/2015   CLINICAL DATA:  History of rectal cancer. The patient is discharged home on 04/21/2015 following dilatation procedure of the rectum and ileostomy. Patient is unable to keep anything down. Not taking his meds. Fever.  EXAM: CT ABDOMEN AND PELVIS WITH CONTRAST  TECHNIQUE: Multidetector CT imaging of the abdomen and pelvis was performed using the standard protocol following bolus administration of intravenous contrast.  CONTRAST:  177mL OMNIPAQUE IOHEXOL 300 MG/ML  SOLN  COMPARISON:  CT of  the abdomen and pelvis on 01/31/2015, plain films 04/22/2015  FINDINGS: Lower chest: Lung bases are unremarkable. Heart size is normal. No imaged pericardial effusion or significant coronary artery calcifications.  Upper abdomen: There is a small amount of perihepatic fluid. Otherwise the liver has a normal appearance. No focal abnormality identified within the spleen, pancreas, or adrenal glands. Gallbladder is present.  Gastrointestinal tract: The stomach is distended with contrast and fluid. Patient has had ileostomy in the right lower quadrant. The small bowel loops distal to the ileostomy and terminal ileum appear normal. High-grade small bowel obstruction is best localized to the right lower quadrant though probably not at the ileostomy itself. Bowel loops in this region have a thickened wall. There is loculated pelvic fluid with enhancing peritoneum appear within the left lower quadrant there is stranding of peritoneal fat which is suspicious for peritoneal based metastatic disease. There is thickening of the presacral fat plane. The distal colon is collapsed. Ascending and transverse colon contain a small amount of air.  Pelvis: Urinary bladder has a normal appearance. Free pelvic fluid. See above.  Retroperitoneum: No evidence for aortic aneurysm. Small retroperitoneal and mesenteric lymph nodes are identified, nonspecific in appearance and stable since prior study.  Abdominal wall: Postoperative changes. Right lower quadrant ileostomy.  Osseous structures: No suspicious lytic or blastic lesions are identified.  IMPRESSION: 1. High-grade small bowel obstruction, best localized to the right lower quadrant where there are numerous small  bowel loops with thickened and enhancing wall. Point of obstruction is felt to be proximal to the ileostomy. 2. Small bowel loops distal to the ileostomy are relatively normal in appearance. 3. Distal colon is collapsed. 4. Loculated ascites associated with enhancing  peritoneum. Findings are consistent with abscess or peritoneal based metastases. Stranding in the left lower quadrant, suspicious for omental/peritoneal based metastases. 5. Thickening of the presacral fat may be related to post treatment changes.   Electronically Signed   By: Nolon Nations M.D.   On: 04/22/2015 20:10   Dg Abd Acute W/chest  04/22/2015   CLINICAL DATA:  Nausea and vomiting for 1 day. History of rectal carcinoma.  EXAM: DG ABDOMEN ACUTE W/ 1V CHEST  COMPARISON:  CT, 01/31/2015  FINDINGS: There is dilated small bowel with air-fluid levels. Air is seen within a nondistended right colon with no significant left colon or rectal air. There is no free air.  Abdominal pelvic soft tissues are unremarkable.  Heart, mediastinum and hila are unremarkable. Lungs are clear. No pleural effusion or pneumothorax. Left anterior chest wall Port-A-Cath has its tip in the lower superior vena cava.  Skeletal structures are unremarkable.  IMPRESSION: 1. Findings are consistent with a partial small bowel obstruction. 2. No free air. 3. No acute cardiopulmonary disease.   Electronically Signed   By: Lajean Manes M.D.   On: 04/22/2015 15:09   Dg Abd Portable 2v  04/23/2015   CLINICAL DATA:  Small-bowel obstruction  EXAM: PORTABLE ABDOMEN - 2 VIEW  COMPARISON:  CT 04/22/2015.  FINDINGS: Contrast is noted in the ostomy bag and bladder from prior CT. Persistent dilated loops small bowel are noted consistent with small bowel obstruction. No pneumatosis. No free air noted. No acute bony abnormality .  IMPRESSION: 1. Oral contrast is noted the ostomy bag from prior CT. 2. Persistent prominent dilated loops of small bowel consistent with persistent small-bowel obstruction. No free air.   Electronically Signed   By: Marcello Moores  Register   On: 04/23/2015 07:32    Assessment: s/p Procedure(s): RETROGRADE COLONOSCOPY AND DILATION OF ANAL STRICTURE Patient Active Problem List   Diagnosis Date Noted  . Small bowel  obstruction 04/22/2015  . 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 Rectal cancer 02/01/2014      Plan: Cont IVF's Clear liquid diet  Will switch to Cip/Flagyl Ambulate   LOS: 1 day     .[name], [facility], [place]   04/23/2015 8:26 AM

## 2015-04-24 ENCOUNTER — Inpatient Hospital Stay (HOSPITAL_COMMUNITY): Payer: 59

## 2015-04-24 NOTE — Progress Notes (Addendum)
Patient ID: Seth Villanueva, male   DOB: 02-18-1981, 34 y.o.   MRN: [id number] Patient presented to ultrasound department today for possible paracentesis. On limited ultrasound of abdomen in all 4 quadrants there is no significant amount of ascites present to aspirate at this time. Procedure was cancelled. Patient notified.

## 2015-04-24 NOTE — Progress Notes (Signed)
anal dilation with colonoscopy Subjective: Pt tolerating clears and having ostomy output.  Feels less bloated  Objective: Vital signs in last 24 hours: Temp:  [98 F (36.7 C)-99 F (37.2 C)] 98.4 F (36.9 C) (09/14 0524) Pulse Rate:  [73-88] 73 (09/14 0524) Resp:  [18] 18 (09/14 0524) BP: (115-126)/(52-95) 126/74 mmHg (09/14 0524) SpO2:  [98 %-100 %] 100 % (09/14 0524)  Intake/Output from previous day: 09/13 0701 - 09/14 0700 In: 1100 [I.V.:1100] Out: 1875 [Urine:675; Stool:1200] Intake/Output this shift:    General appearance: alert and cooperative GI: normal findings: soft, mildly bloated, no tenderness to palpation  Lab Results:   Recent Labs  04/22/15 1413 04/23/15 0536  WBC 10.4 6.6  HGB 15.6 13.2  HCT 44.0 37.8*  PLT 160 156   BMET  Recent Labs  04/22/15 1413 04/23/15 0536  NA 136 135  K 3.3* 3.5  CL 96* 100*  CO2 26 27  GLUCOSE 120* 131*  BUN 14 11  CREATININE 0.92 1.01  CALCIUM 9.4 8.5*   PT/INR No results for input(s): LABPROT, INR in the last 72 hours. ABG No results for input(s): PHART, HCO3 in the last 72 hours.  Invalid input(s): PCO2, PO2  MEDS, Scheduled . ciprofloxacin  400 mg Intravenous Q12H  . heparin  5,000 Units Subcutaneous 3 times per day  . metronidazole  500 mg Intravenous Q6H    Studies/Results: Ct Abdomen Pelvis W Contrast  04/22/2015   CLINICAL DATA:  History of rectal cancer. The patient is discharged home on 04/21/2015 following dilatation procedure of the rectum and ileostomy. Patient is unable to keep anything down. Not taking his meds. Fever.  EXAM: CT ABDOMEN AND PELVIS WITH CONTRAST  TECHNIQUE: Multidetector CT imaging of the abdomen and pelvis was performed using the standard protocol following bolus administration of intravenous contrast.  CONTRAST:  146mL OMNIPAQUE IOHEXOL 300 MG/ML  SOLN  COMPARISON:  CT of the abdomen and pelvis on 01/31/2015, plain films 04/22/2015  FINDINGS: Lower chest: Lung bases are  unremarkable. Heart size is normal. No imaged pericardial effusion or significant coronary artery calcifications.  Upper abdomen: There is a small amount of perihepatic fluid. Otherwise the liver has a normal appearance. No focal abnormality identified within the spleen, pancreas, or adrenal glands. Gallbladder is present.  Gastrointestinal tract: The stomach is distended with contrast and fluid. Patient has had ileostomy in the right lower quadrant. The small bowel loops distal to the ileostomy and terminal ileum appear normal. High-grade small bowel obstruction is best localized to the right lower quadrant though probably not at the ileostomy itself. Bowel loops in this region have a thickened wall. There is loculated pelvic fluid with enhancing peritoneum appear within the left lower quadrant there is stranding of peritoneal fat which is suspicious for peritoneal based metastatic disease. There is thickening of the presacral fat plane. The distal colon is collapsed. Ascending and transverse colon contain a small amount of air.  Pelvis: Urinary bladder has a normal appearance. Free pelvic fluid. See above.  Retroperitoneum: No evidence for aortic aneurysm. Small retroperitoneal and mesenteric lymph nodes are identified, nonspecific in appearance and stable since prior study.  Abdominal wall: Postoperative changes. Right lower quadrant ileostomy.  Osseous structures: No suspicious lytic or blastic lesions are identified.  IMPRESSION: 1. High-grade small bowel obstruction, best localized to the right lower quadrant where there are numerous small bowel loops with thickened and enhancing wall. Point of obstruction is felt to be proximal to the ileostomy. 2. Small bowel loops  distal to the ileostomy are relatively normal in appearance. 3. Distal colon is collapsed. 4. Loculated ascites associated with enhancing peritoneum. Findings are consistent with abscess or peritoneal based metastases. Stranding in the left lower  quadrant, suspicious for omental/peritoneal based metastases. 5. Thickening of the presacral fat may be related to post treatment changes.   Electronically Signed   By: Nolon Nations M.D.   On: 04/22/2015 20:10   Dg Abd Acute W/chest  04/22/2015   CLINICAL DATA:  Nausea and vomiting for 1 day. History of rectal carcinoma.  EXAM: DG ABDOMEN ACUTE W/ 1V CHEST  COMPARISON:  CT, 01/31/2015  FINDINGS: There is dilated small bowel with air-fluid levels. Air is seen within a nondistended right colon with no significant left colon or rectal air. There is no free air.  Abdominal pelvic soft tissues are unremarkable.  Heart, mediastinum and hila are unremarkable. Lungs are clear. No pleural effusion or pneumothorax. Left anterior chest wall Port-A-Cath has its tip in the lower superior vena cava.  Skeletal structures are unremarkable.  IMPRESSION: 1. Findings are consistent with a partial small bowel obstruction. 2. No free air. 3. No acute cardiopulmonary disease.   Electronically Signed   By: Lajean Manes M.D.   On: 04/22/2015 15:09   Dg Abd Portable 2v  04/23/2015   CLINICAL DATA:  Small-bowel obstruction  EXAM: PORTABLE ABDOMEN - 2 VIEW  COMPARISON:  CT 04/22/2015.  FINDINGS: Contrast is noted in the ostomy bag and bladder from prior CT. Persistent dilated loops small bowel are noted consistent with small bowel obstruction. No pneumatosis. No free air noted. No acute bony abnormality .  IMPRESSION: 1. Oral contrast is noted the ostomy bag from prior CT. 2. Persistent prominent dilated loops of small bowel consistent with persistent small-bowel obstruction. No free air.   Electronically Signed   By: Marcello Moores  Register   On: 04/23/2015 07:32    Assessment: s/p Procedure(s): RETROGRADE COLONOSCOPY AND DILATION OF ANAL STRICTURE Patient Active Problem List   Diagnosis Date Noted  . Small bowel obstruction 04/22/2015  . 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  Rectal cancer 02/01/2014      Plan: Cont IVF's Clear liquid diet, advance as tolerated Cont Cip/Flagyl Possible diagnostic paracentesis today Ambulate   LOS: 2 days     .[name], [facility], [place] [phone]   04/24/2015 8:33 AM

## 2015-04-25 ENCOUNTER — Encounter (HOSPITAL_COMMUNITY): Payer: Self-pay | Admitting: *Deleted

## 2015-04-25 ENCOUNTER — Other Ambulatory Visit: Payer: Self-pay | Admitting: General Surgery

## 2015-04-25 DIAGNOSIS — Z980 Intestinal bypass and anastomosis status: Secondary | ICD-10-CM

## 2015-04-25 NOTE — Progress Notes (Signed)
anal dilation with colonoscopy Subjective: Pt tolerating soft diet and having ostomy output.  Feels less bloated.  UOP better. US showed resolution of ascites  Objective: Vital signs in last 24 hours: Temp:  [98 F (36.7 C)-98.8 F (37.1 C)] 98.6 F (37 C) (09/15 0529) Pulse Rate:  [68-83] 79 (09/15 0529) Resp:  [15-18] 18 (09/15 0529) BP: (116-140)/(69-88) 116/73 mmHg (09/15 0529) SpO2:  [97 %-100 %] 99 % (09/15 0529)  Intake/Output from previous day: 09/14 0701 - 09/15 0700 In: 4980 [P.O.:480; I.V.:4500] Out: 2325 [Urine:1450; Stool:875] Intake/Output this shift: Total I/O In: -  Out: 525 [Urine:350; Stool:175]  General appearance: alert and cooperative GI: normal findings: soft, mildly bloated, no tenderness to palpation  Lab Results:   Recent Labs  04/22/15 1413 04/23/15 0536  WBC 10.4 6.6  HGB 15.6 13.2  HCT 44.0 37.8*  PLT 160 156   BMET  Recent Labs  04/22/15 1413 04/23/15 0536  NA 136 135  K 3.3* 3.5  CL 96* 100*  CO2 26 27  GLUCOSE 120* 131*  BUN 14 11  CREATININE 0.92 1.01  CALCIUM 9.4 8.5*   PT/INR No results for input(s): LABPROT, INR in the last 72 hours. ABG No results for input(s): PHART, HCO3 in the last 72 hours.  Invalid input(s): PCO2, PO2  MEDS, Scheduled . ciprofloxacin  400 mg Intravenous Q12H  . heparin  5,000 Units Subcutaneous 3 times per day  . metronidazole  500 mg Intravenous Q6H    Studies/Results: US Abdomen Limited  04/24/2015   CLINICAL DATA:  Evaluate for ascites.  EXAM: LIMITED ABDOMEN ULTRASOUND FOR ASCITES  TECHNIQUE: Limited ultrasound survey for ascites was performed in all four abdominal quadrants.  COMPARISON:  CT abdomen pelvis 04/22/2015  FINDINGS: No significant pocket of fluid demonstrated within the abdomen. Small amount of fluid.  IMPRESSION: No significant pocket of fluid within the abdomen to allow percutaneous drainage.   Electronically Signed   By: Lovey Newcomer M.D.   On: 04/24/2015 12:27     Assessment: s/p Procedure(s): RETROGRADE COLONOSCOPY AND DILATION OF ANAL STRICTURE Patient Active Problem List   Diagnosis Date Noted  . Small bowel obstruction 04/22/2015  . 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 Rectal cancer 02/01/2014      Plan: Cont IVF's, decrease rate Clear soft diet, advance as tolerated Cont Cip/Flagyl Ambulate   LOS: 3 days     .[name], [facility], [place]   04/25/2015 7:21 AM

## 2015-04-26 ENCOUNTER — Other Ambulatory Visit: Payer: Self-pay | Admitting: General Surgery

## 2015-04-26 DIAGNOSIS — Z980 Intestinal bypass and anastomosis status: Secondary | ICD-10-CM

## 2015-04-26 MED ORDER — HEPARIN SOD (PORK) LOCK FLUSH 100 UNIT/ML IV SOLN
500.0000 [IU] | INTRAVENOUS | Status: AC | PRN
  Administered 2015-04-26: 500 [IU]

## 2015-04-26 MED ORDER — SODIUM CHLORIDE 0.9 % IJ SOLN
10.0000 mL | INTRAMUSCULAR | Status: DC | PRN
  Administered 2015-04-26: 10 mL

## 2015-04-26 NOTE — Progress Notes (Signed)
anal dilation with colonoscopy Subjective: Pt tolerating soft diet and having ostomy output.  Feels less bloated.  UOP better.   Objective: Vital signs in last 24 hours: Temp:  [98.2 F (36.8 C)-98.6 F (37 C)] 98.4 F (36.9 C) (09/16 0627) Pulse Rate:  [74-79] 74 (09/16 0627) Resp:  [18] 18 (09/16 0627) BP: (109-126)/(62-70) 125/64 mmHg (09/16 0627) SpO2:  [100 %] 100 % (09/16 0627)  Intake/Output from previous day: 09/15 0701 - 09/16 0700 In: 4192.5 [P.O.:480; I.V.:1812.5; IV Piggyback:1900] Out: 4000 [Urine:2350; Stool:1650] Intake/Output this shift:    General appearance: alert and cooperative GI: normal findings: soft, not distended, no tenderness to palpation  Lab Results:  No results for input(s): WBC, HGB, HCT, PLT in the last 72 hours. BMET No results for input(s): NA, K, CL, CO2, GLUCOSE, BUN, CREATININE, CALCIUM in the last 72 hours. PT/INR No results for input(s): LABPROT, INR in the last 72 hours. ABG No results for input(s): PHART, HCO3 in the last 72 hours.  Invalid input(s): PCO2, PO2  MEDS, Scheduled . ciprofloxacin  400 mg Intravenous Q12H  . heparin  5,000 Units Subcutaneous 3 times per day  . metronidazole  500 mg Intravenous Q6H    Studies/Results: US Abdomen Limited  04/24/2015   CLINICAL DATA:  Evaluate for ascites.  EXAM: LIMITED ABDOMEN ULTRASOUND FOR ASCITES  TECHNIQUE: Limited ultrasound survey for ascites was performed in all four abdominal quadrants.  COMPARISON:  CT abdomen pelvis 04/22/2015  FINDINGS: No significant pocket of fluid demonstrated within the abdomen. Small amount of fluid.  IMPRESSION: No significant pocket of fluid within the abdomen to allow percutaneous drainage.   Electronically Signed   By: Lovey Newcomer M.D.   On: 04/24/2015 12:27    Assessment: s/p Procedure(s): RETROGRADE COLONOSCOPY AND DILATION OF ANAL STRICTURE Patient Active Problem List   Diagnosis Date Noted  . Small bowel obstruction 04/22/2015  . Anal  stricture 04/18/2015  . 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 Rectal cancer 02/01/2014      Plan: D/c IVF's Cont soft diet D/C Cip/Flagyl Ambulate Poss d/c later this afternoon   LOS: 4 days     .[name], [facility], [place] [phone]   04/26/2015 8:19 AM

## 2015-04-26 NOTE — Progress Notes (Signed)
Patient is alert and oriented, vital signs are stable, patient tolerated lunch and dinner without any complaints of nausea or vomiting, patient medicated once for discomfort with 1 percocet and effective, patient to follow up with MD Marcello Moores for a barium enema Seth Mends RN 5:38 PM 04-26-2015

## 2015-04-26 NOTE — Discharge Summary (Signed)
Physician Discharge Summary  Patient ID: Seth Villanueva MRN: [id number] DOB/AGE: May 09, 1981 34 y.o.  Admit date: 04/22/2015 Discharge date: 04/26/2015  Admission Diagnoses: SBO  Discharge Diagnoses:  Active Problems:   ileus   Discharged Condition: good  Hospital Course: patient admitted ~24h after being discharged due to nausea and vomiting.  CT showed ascites and ileus vs SBO.  US performed 2 days later revealed resolution of ascites.  His diet was gradually advanced.  He was discharged home once he was able to tolerate a diet and be off IVF's.  Consults: None  Significant Diagnostic Studies: [place]: no ascites  Treatments: IV hydration and antibiotics: Cipro and metronidazole  Discharge Exam: Blood pressure 125/64, pulse 74, temperature 98.4 F (36.9 C), temperature source Oral, resp. rate 18, height 6\' 2"  (1.88 m), weight 72.576 kg (160 lb), SpO2 100 %. General appearance: alert and cooperative GI: soft, non-tender; bowel sounds normal; no masses,  no organomegaly  Disposition: 01-Home or Self Care     Medication List    STOP taking these medications        amoxicillin-clavulanate 875-125 MG per tablet  Commonly known as:  AUGMENTIN      TAKE these medications        acetaminophen 325 MG tablet  Commonly known as:  TYLENOL  Take 325 mg by mouth once as needed for mild pain (pain).     IMODIUM A-D PO  Take 2 mg by mouth daily as needed (loose stool).     lidocaine-prilocaine cream  Commonly known as:  EMLA  Apply 1 application topically as needed. Apply to [facility] site 1-2 hours prior to stick and cover with plastic wrap to numb site     NON FORMULARY  Take 2 tablets by mouth 2 (two) times daily. 1-3D Beta Glucan - Immune modulator     ondansetron 8 MG tablet  Commonly known as:  ZOFRAN  Take 1 tablet (8 mg total) by mouth every 8 (eight) hours as needed for nausea or vomiting.     oxyCODONE-acetaminophen 5-325 MG per tablet  Commonly known as:   PERCOCET/ROXICET  Take 1-2 tablets by mouth every 4 (four) hours as needed for moderate pain.     pseudoephedrine 60 MG tablet  Commonly known as:  SUDAFED  Take 60 mg by mouth every 4 (four) hours as needed for congestion.           Follow-up Information    Follow up with [name]., MD.   Specialty:  General Surgery   Why:  Call the office next week, if haven't heard from [place] by Tues to schedule your barium enema   Contact information:   [address] [place] [address] [phone]       Signed: [name] 09/23/863, 7:84 AM

## 2015-04-26 NOTE — Discharge Instructions (Signed)
DISCHARGE  INSTRUCTIONS  1. DIET: Follow a light bland diet the first 24 hours after arrival home, such as soup, liquids, crackers, etc.  Be sure to include lots of fluids daily.  Avoid fast food or heavy meals as your are more likely to get nauseated.    2. Take your usually prescribed home medications unless otherwise directed. 3. PAIN CONTROL: a. Pain is best controlled by a usual combination of three different methods TOGETHER: i. Ice/Heat ii. Over the counter pain medication iii. Prescription pain medication b. It is helpful to take an over-the-counter pain medication regularly for the first few weeks.  Choose one of the following that works best for you: i. Naproxen (Aleve, etc)  Two 220mg  tabs twice a day ii. Ibuprofen (Advil, etc) Three 200mg  tabs four times a day (every meal & bedtime)   4. Watch out for diarrhea.  If you have many loose bowel movements, simplify your diet to bland foods & liquids for a few days.  Stop any stool softeners and decrease your fiber supplement.  Switching to mild anti-diarrheal medications (Kayopectate, Pepto Bismol) can help.  If this worsens or does not improve, please call us. 5. Wash / shower every day.   6. ACTIVITIES as tolerated:   a. You may resume regular (light) daily activities beginning the next day--such as daily self-care, walking, climbing stairs--gradually increasing activities as tolerated.  If you can walk 30 minutes without difficulty, it is safe to try more intense activity such as jogging, treadmill, bicycling, low-impact aerobics, swimming, etc. b. Save the most intensive and strenuous activity for last such as sit-ups, heavy lifting, contact sports, etc  Refrain from any heavy lifting or straining until you are off narcotics for pain control.   c. DO NOT PUSH THROUGH PAIN.  Let pain be your guide: If it hurts to do something, don't do it.  Pain is your body warning you to avoid that activity for another week until the pain goes  down. d. You may drive when you are no longer taking prescription pain medication, you can comfortably wear a seatbelt, and you can safely maneuver your car and apply brakes. e. Dennis Bast may have sexual intercourse when it is comfortable.  7. FOLLOW UP in our office a. Please call [facility] at [phone] to set up an appointment to see your surgeon in the office for a follow-up appointment approximately 2-3 weeks after your surgery. b. Make sure that you call for this appointment the day you arrive home to insure a convenient appointment time. 10. IF YOU HAVE DISABILITY OR FAMILY LEAVE FORMS, BRING THEM TO THE OFFICE FOR PROCESSING.  DO NOT GIVE THEM TO YOUR DOCTOR.   WHEN TO CALL us [phone]: 1. Poor pain control 2. Reactions / problems with new medications (rash/itching, nausea, etc)  3. Fever over 101.5 F (38.5 C) 4. Inability to urinate 5. Nausea and/or vomiting 6. Worsening swelling or bruising 7. Continued bleeding from incision. 8. Increased pain, redness, or drainage from the incision   The clinic staff is available to answer your questions during regular business hours (8:30am-5pm).  Please dont hesitate to call and ask to speak to one of our nurses for clinical concerns.   If you have a medical emergency, go to the nearest emergency room or call 911.  A surgeon from [facility] is always on call at the [facility], [place], [place], [place], [place]  [address] ? MAIN: (  [phone]) [phone] ? TOLL FREE: [phone] ?  FAX ([phone]) [phone] [email or web address]

## 2015-05-01 ENCOUNTER — Telehealth: Payer: Self-pay | Admitting: General Surgery

## 2015-05-01 ENCOUNTER — Ambulatory Visit
Admission: RE | Admit: 2015-05-01 | Discharge: 2015-05-01 | Disposition: A | Discharge status: Home / Self Care | Payer: 59 | Source: Ambulatory Visit | Attending: General Surgery | Admitting: General Surgery

## 2015-05-01 ENCOUNTER — Encounter (INDEPENDENT_AMBULATORY_CARE_PROVIDER_SITE_OTHER): Payer: Self-pay

## 2015-05-01 NOTE — Telephone Encounter (Signed)
i notified the patient of his results. Patient still has a small anterior leak.  I have recommended that we cancel surgery.  I will see him back in the office in 2 weeks for repeat exam.

## 2015-05-08 ENCOUNTER — Inpatient Hospital Stay (HOSPITAL_COMMUNITY): Admission: RE | Admit: 2015-05-08 | Payer: 59 | Source: Ambulatory Visit | Admitting: General Surgery

## 2015-05-08 SURGERY — CLOSURE, ILEOSTOMY
Anesthesia: General

## 2015-05-13 ENCOUNTER — Other Ambulatory Visit: Payer: Self-pay | Admitting: General Surgery

## 2015-05-13 DIAGNOSIS — Z980 Intestinal bypass and anastomosis status: Secondary | ICD-10-CM

## 2015-05-31 ENCOUNTER — Ambulatory Visit
Admission: RE | Admit: 2015-05-31 | Discharge: 2015-05-31 | Disposition: A | Discharge status: Home / Self Care | Payer: 59 | Source: Ambulatory Visit | Attending: General Surgery | Admitting: General Surgery

## 2015-06-07 ENCOUNTER — Ambulatory Visit: Payer: 59

## 2015-06-07 VITALS — BP 115/73 | HR 74 | Temp 97.8°F | Resp 18

## 2015-06-07 MED ORDER — HEPARIN SOD (PORK) LOCK FLUSH 100 UNIT/ML IV SOLN
500.0000 [IU] | Freq: Once | INTRAVENOUS | Status: AC
  Filled 2015-06-07: qty 5

## 2015-06-07 MED ORDER — SODIUM CHLORIDE 0.9 % IJ SOLN
10.0000 mL | INTRAMUSCULAR | Status: AC | PRN
  Filled 2015-06-07: qty 10

## 2015-06-17 ENCOUNTER — Telehealth: Payer: Self-pay | Admitting: Oncology

## 2015-06-17 NOTE — Telephone Encounter (Signed)
Patient called in to as he is over due for a flush

## 2015-06-21 ENCOUNTER — Ambulatory Visit (HOSPITAL_BASED_OUTPATIENT_CLINIC_OR_DEPARTMENT_OTHER): Payer: 59

## 2015-06-21 DIAGNOSIS — Z85048 Personal history of other malignant neoplasm of rectum, rectosigmoid junction, and anus: Secondary | ICD-10-CM

## 2015-06-21 DIAGNOSIS — Z95828 Presence of other vascular implants and grafts: Secondary | ICD-10-CM

## 2015-06-21 MED ORDER — HEPARIN SOD (PORK) LOCK FLUSH 100 UNIT/ML IV SOLN
500.0000 [IU] | Freq: Once | INTRAVENOUS | Status: AC
  Administered 2015-06-21: 500 [IU] via INTRAVENOUS
  Filled 2015-06-21: qty 5

## 2015-06-21 MED ORDER — SODIUM CHLORIDE 0.9 % IJ SOLN
10.0000 mL | INTRAMUSCULAR | Status: DC | PRN
  Administered 2015-06-21: 10 mL via INTRAVENOUS
  Filled 2015-06-21: qty 10

## 2015-06-21 NOTE — Patient Instructions (Signed)

## 2015-06-26 ENCOUNTER — Encounter: Payer: Self-pay | Admitting: Genetic Counselor

## 2015-06-26 DIAGNOSIS — Z1379 Encounter for other screening for genetic and chromosomal anomalies: Secondary | ICD-10-CM | POA: Insufficient documentation

## 2015-07-10 ENCOUNTER — Encounter (HOSPITAL_COMMUNITY)
Admission: RE | Admit: 2015-07-10 | Discharge: 2015-07-10 | Disposition: A | Discharge status: Home / Self Care | Payer: 59 | Source: Ambulatory Visit | Attending: General Surgery | Admitting: General Surgery

## 2015-07-10 ENCOUNTER — Encounter (HOSPITAL_COMMUNITY): Payer: Self-pay

## 2015-07-10 HISTORY — DX: Anesthesia of skin: R20.0

## 2015-07-10 LAB — CBC
HCT: 40.8 % (ref 39.0–52.0)
HEMOGLOBIN: 14.2 g/dL (ref 13.0–17.0)
MCH: 31.8 pg (ref 26.0–34.0)
MCHC: 34.8 g/dL (ref 30.0–36.0)
MCV: 91.3 fL (ref 78.0–100.0)
Platelets: 142 10*3/uL — ABNORMAL LOW (ref 150–400)
RBC: 4.47 MIL/uL (ref 4.22–5.81)
RDW: 13.3 % (ref 11.5–15.5)
WBC: 4.7 10*3/uL (ref 4.0–10.5)

## 2015-07-10 LAB — BASIC METABOLIC PANEL
ANION GAP: 6 (ref 5–15)
BUN: 13 mg/dL (ref 6–20)
CALCIUM: 8.8 mg/dL — AB (ref 8.9–10.3)
CHLORIDE: 106 mmol/L (ref 101–111)
CO2: 27 mmol/L (ref 22–32)
CREATININE: 0.98 mg/dL (ref 0.61–1.24)
Glucose, Bld: 136 mg/dL — ABNORMAL HIGH (ref 65–99)
POTASSIUM: 3.6 mmol/L (ref 3.5–5.1)
SODIUM: 139 mmol/L (ref 135–145)

## 2015-07-10 NOTE — Progress Notes (Signed)
CT abd/pelvis in epic 04/22/2015

## 2015-07-10 NOTE — Patient Instructions (Signed)
ASCHER BERTRAND  07/10/2015   Your procedure is scheduled on: Friday July 12, 2015   Report to [facility] Main  Entrance take [place]  elevators to 3rd floor to  [place] at 5:30 AM.  Call this number if you have problems the morning of surgery [phone]   Remember: ONLY 1 PERSON MAY GO WITH YOU TO SHORT STAY TO GET  READY MORNING OF [place].  Do not eat food or drink liquids :After Midnight.     Take these medicines the morning of surgery with A SIP OF WATER: NONE              You may not have any metal on your body including hair pins and              piercings  Do not wear jewelry, lotions, powders or colognes, deodorant             Men may shave face and neck.   Do not bring valuables to the hospital. [place].  Contacts, dentures or bridgework may not be worn into surgery.  Leave suitcase in the car. After surgery it may be brought to your room.   _____________________________________________________________________             [facility] - Preparing for Surgery Before surgery, you can play an important role.  Because skin is not sterile, your skin needs to be as free of germs as possible.  You can reduce the number of germs on your skin by washing with CHG (chlorahexidine gluconate) soap before surgery.  CHG is an antiseptic cleaner which kills germs and bonds with the skin to continue killing germs even after washing. Please DO NOT use if you have an allergy to CHG or antibacterial soaps.  If your skin becomes reddened/irritated stop using the CHG and inform your nurse when you arrive at Short Stay. Do not shave (including legs and underarms) for at least 48 hours prior to the first CHG shower.  You may shave your face/neck. Please follow these instructions carefully:  1.  Shower with CHG Soap the night before surgery and the  morning of Surgery.  2.  If you choose to wash your hair,  wash your hair first as usual with your  normal  shampoo.  3.  After you shampoo, rinse your hair and body thoroughly to remove the  shampoo.                           4.  Use CHG as you would any other liquid soap.  You can apply chg directly  to the skin and wash                       Gently with a scrungie or clean washcloth.  5.  Apply the CHG Soap to your body ONLY FROM THE NECK DOWN.   Do not use on face/ open                           Wound or open sores. Avoid contact with eyes, ears mouth and genitals (private parts).  Wash face,  Genitals (private parts) with your normal soap.             6.  Wash thoroughly, paying special attention to the area where your surgery  will be performed.  7.  Thoroughly rinse your body with warm water from the neck down.  8.  DO NOT shower/wash with your normal soap after using and rinsing off  the CHG Soap.                9.  Pat yourself dry with a clean towel.            10.  Wear clean pajamas.            11.  Place clean sheets on your bed the night of your first shower and do not  sleep with pets. Day of Surgery : Do not apply any lotions/deodorants the morning of surgery.  Please wear clean clothes to the hospital/surgery center.  FAILURE TO FOLLOW THESE INSTRUCTIONS MAY RESULT IN THE CANCELLATION OF YOUR SURGERY PATIENT SIGNATURE_________________________________  NURSE SIGNATURE__________________________________  ________________________________________________________________________

## 2015-07-11 ENCOUNTER — Telehealth: Payer: Self-pay | Admitting: Oncology

## 2015-07-11 NOTE — Anesthesia Preprocedure Evaluation (Addendum)
Anesthesia Evaluation  Patient identified by MRN, date of birth, ID band Patient awake    Reviewed: Allergy & Precautions, H&P , NPO status , Patient's Chart, lab work & pertinent test results  History of Anesthesia Complications Negative for: history of anesthetic complications  Airway Mallampati: I  TM Distance: >3 FB Neck ROM: Full    Dental no notable dental hx. (+) Teeth Intact, Dental Advisory Given   Pulmonary former smoker,    Pulmonary exam normal breath sounds clear to auscultation       Cardiovascular negative cardio ROS Normal cardiovascular exam Rhythm:Regular Rate:Normal     Neuro/Psych PSYCHIATRIC DISORDERS Anxiety negative neurological ROS     GI/Hepatic Neg liver ROS, Hx of rectal cancer stage 3   Endo/Other  negative endocrine ROS  Renal/GU negative Renal ROS  negative genitourinary   Musculoskeletal negative musculoskeletal ROS (+)   Abdominal   Peds negative pediatric ROS (+)  Hematology negative hematology ROS (+)   Anesthesia Other Findings   Reproductive/Obstetrics negative OB ROS                            Anesthesia Physical Anesthesia Plan  ASA: II  Anesthesia Plan: General   Post-op Pain Management:    Induction: Intravenous  Airway Management Planned: Oral ETT  Additional Equipment:   Intra-op Plan:   Post-operative Plan: Extubation in OR  Informed Consent: I have reviewed the patients History and Physical, chart, labs and discussed the procedure including the risks, benefits and alternatives for the proposed anesthesia with the patient or authorized representative who has indicated his/her understanding and acceptance.   Dental advisory given  Plan Discussed with: CRNA and Surgeon  Anesthesia Plan Comments:         Anesthesia Quick Evaluation

## 2015-07-11 NOTE — Telephone Encounter (Signed)
returned call and s.w. pt cx flush....pt did not want to keep he is having surgery

## 2015-07-12 ENCOUNTER — Encounter (HOSPITAL_COMMUNITY)
Admission: RE | Disposition: A | Discharge status: Home / Self Care | Payer: Self-pay | Source: Ambulatory Visit | Attending: General Surgery

## 2015-07-12 ENCOUNTER — Encounter (HOSPITAL_COMMUNITY): Payer: Self-pay | Admitting: *Deleted

## 2015-07-12 ENCOUNTER — Inpatient Hospital Stay (HOSPITAL_COMMUNITY): Payer: 59 | Admitting: Anesthesiology

## 2015-07-12 ENCOUNTER — Inpatient Hospital Stay (HOSPITAL_COMMUNITY)
Admission: RE | Admit: 2015-07-12 | Discharge: 2015-07-15 | DRG: 330 | Disposition: A | Discharge status: Home / Self Care | Payer: 59 | Source: Ambulatory Visit | Attending: General Surgery | Admitting: General Surgery

## 2015-07-12 DIAGNOSIS — Z79899 Other long term (current) drug therapy: Secondary | ICD-10-CM | POA: Diagnosis not present

## 2015-07-12 DIAGNOSIS — Z803 Family history of malignant neoplasm of breast: Secondary | ICD-10-CM

## 2015-07-12 DIAGNOSIS — Z01810 Encounter for preprocedural cardiovascular examination: Secondary | ICD-10-CM | POA: Diagnosis not present

## 2015-07-12 DIAGNOSIS — Z87891 Personal history of nicotine dependence: Secondary | ICD-10-CM | POA: Diagnosis not present

## 2015-07-12 DIAGNOSIS — Z881 Allergy status to other antibiotic agents status: Secondary | ICD-10-CM

## 2015-07-12 DIAGNOSIS — Z01812 Encounter for preprocedural laboratory examination: Secondary | ICD-10-CM | POA: Diagnosis not present

## 2015-07-12 DIAGNOSIS — Z8249 Family history of ischemic heart disease and other diseases of the circulatory system: Secondary | ICD-10-CM

## 2015-07-12 DIAGNOSIS — Z791 Long term (current) use of non-steroidal anti-inflammatories (NSAID): Secondary | ICD-10-CM

## 2015-07-12 DIAGNOSIS — Z432 Encounter for attention to ileostomy: Secondary | ICD-10-CM | POA: Diagnosis present

## 2015-07-12 DIAGNOSIS — Z932 Ileostomy status: Secondary | ICD-10-CM

## 2015-07-12 DIAGNOSIS — C20 Malignant neoplasm of rectum: Secondary | ICD-10-CM | POA: Diagnosis present

## 2015-07-12 DIAGNOSIS — Z79891 Long term (current) use of opiate analgesic: Secondary | ICD-10-CM

## 2015-07-12 DIAGNOSIS — Z888 Allergy status to other drugs, medicaments and biological substances status: Secondary | ICD-10-CM | POA: Diagnosis not present

## 2015-07-12 HISTORY — PX: ILEOSTOMY CLOSURE: SHX1784

## 2015-07-12 HISTORY — PX: LAPAROSCOPY: SHX197

## 2015-07-12 SURGERY — CLOSURE, ILEOSTOMY
Anesthesia: General

## 2015-07-12 MED ORDER — HYDROMORPHONE HCL 1 MG/ML IJ SOLN
INTRAMUSCULAR | Status: AC
  Filled 2015-07-12: qty 1

## 2015-07-12 MED ORDER — MIDAZOLAM HCL 5 MG/5ML IJ SOLN
INTRAMUSCULAR | Status: DC | PRN
  Administered 2015-07-12: 2 mg via INTRAVENOUS

## 2015-07-12 MED ORDER — DEXAMETHASONE SODIUM PHOSPHATE 10 MG/ML IJ SOLN
INTRAMUSCULAR | Status: AC
  Filled 2015-07-12: qty 1

## 2015-07-12 MED ORDER — PROMETHAZINE HCL 25 MG/ML IJ SOLN
6.2500 mg | INTRAMUSCULAR | Status: DC | PRN
  Administered 2015-07-12: 6.25 mg via INTRAVENOUS

## 2015-07-12 MED ORDER — MEPERIDINE HCL 50 MG/ML IJ SOLN
6.2500 mg | INTRAMUSCULAR | Status: DC | PRN
  Administered 2015-07-12: 12.5 mg via INTRAVENOUS

## 2015-07-12 MED ORDER — LIDOCAINE HCL (CARDIAC) 20 MG/ML IV SOLN
INTRAVENOUS | Status: AC
  Filled 2015-07-12: qty 5

## 2015-07-12 MED ORDER — SIMETHICONE 80 MG PO CHEW
40.0000 mg | CHEWABLE_TABLET | Freq: Four times a day (QID) | ORAL | Status: DC | PRN
  Filled 2015-07-12: qty 1

## 2015-07-12 MED ORDER — HYDROMORPHONE HCL 1 MG/ML IJ SOLN
0.2500 mg | INTRAMUSCULAR | Status: DC | PRN
  Administered 2015-07-12 (×4): 0.5 mg via INTRAVENOUS

## 2015-07-12 MED ORDER — SUGAMMADEX SODIUM 500 MG/5ML IV SOLN
INTRAVENOUS | Status: AC
  Filled 2015-07-12: qty 5

## 2015-07-12 MED ORDER — ENOXAPARIN SODIUM 40 MG/0.4ML ~~LOC~~ SOLN
40.0000 mg | SUBCUTANEOUS | Status: DC
  Administered 2015-07-13 – 2015-07-14 (×2): 40 mg via SUBCUTANEOUS
  Filled 2015-07-12 (×4): qty 0.4

## 2015-07-12 MED ORDER — HYDROMORPHONE HCL 2 MG/ML IJ SOLN
INTRAMUSCULAR | Status: AC
  Filled 2015-07-12: qty 1

## 2015-07-12 MED ORDER — PROMETHAZINE HCL 25 MG/ML IJ SOLN
INTRAMUSCULAR | Status: AC
  Filled 2015-07-12: qty 1

## 2015-07-12 MED ORDER — LIDOCAINE HCL (CARDIAC) 20 MG/ML IV SOLN
INTRAVENOUS | Status: DC | PRN
  Administered 2015-07-12: 50 mg via INTRAVENOUS

## 2015-07-12 MED ORDER — LACTATED RINGERS IV SOLN
INTRAVENOUS | Status: DC | PRN
  Administered 2015-07-12 (×2): via INTRAVENOUS

## 2015-07-12 MED ORDER — MEPERIDINE HCL 50 MG/ML IJ SOLN
INTRAMUSCULAR | Status: AC
  Filled 2015-07-12: qty 1

## 2015-07-12 MED ORDER — 0.9 % SODIUM CHLORIDE (POUR BTL) OPTIME
TOPICAL | Status: DC | PRN
  Administered 2015-07-12: 2000 mL

## 2015-07-12 MED ORDER — DEXTROSE 5 % IV SOLN
2.0000 g | Freq: Two times a day (BID) | INTRAVENOUS | Status: AC
  Administered 2015-07-12: 2 g via INTRAVENOUS
  Filled 2015-07-12: qty 2

## 2015-07-12 MED ORDER — BUPIVACAINE-EPINEPHRINE 0.25% -1:200000 IJ SOLN
INTRAMUSCULAR | Status: AC
  Filled 2015-07-12: qty 1

## 2015-07-12 MED ORDER — CEFOTETAN DISODIUM-DEXTROSE 2-2.08 GM-% IV SOLR
INTRAVENOUS | Status: AC
  Filled 2015-07-12: qty 50

## 2015-07-12 MED ORDER — KCL IN DEXTROSE-NACL 20-5-0.45 MEQ/L-%-% IV SOLN
INTRAVENOUS | Status: DC
  Administered 2015-07-12 – 2015-07-13 (×2): via INTRAVENOUS
  Filled 2015-07-12 (×7): qty 1000

## 2015-07-12 MED ORDER — SUGAMMADEX SODIUM 500 MG/5ML IV SOLN
INTRAVENOUS | Status: DC | PRN
  Administered 2015-07-12: 400 mg via INTRAVENOUS

## 2015-07-12 MED ORDER — METOCLOPRAMIDE HCL 5 MG/ML IJ SOLN
INTRAMUSCULAR | Status: DC | PRN
  Administered 2015-07-12: 10 mg via INTRAVENOUS

## 2015-07-12 MED ORDER — ONDANSETRON HCL 4 MG/2ML IJ SOLN: 4.0000 mg | Freq: Four times a day (QID) | INTRAMUSCULAR | Status: DC | PRN

## 2015-07-12 MED ORDER — MORPHINE SULFATE (PF) 2 MG/ML IV SOLN
2.0000 mg | INTRAVENOUS | Status: DC | PRN
  Administered 2015-07-12 (×2): 2 mg via INTRAVENOUS
  Filled 2015-07-12 (×2): qty 1

## 2015-07-12 MED ORDER — ROCURONIUM BROMIDE 100 MG/10ML IV SOLN
INTRAVENOUS | Status: AC
  Filled 2015-07-12: qty 1

## 2015-07-12 MED ORDER — ONDANSETRON 4 MG PO TBDP: 4.0000 mg | ORAL_TABLET | Freq: Four times a day (QID) | ORAL | Status: DC | PRN

## 2015-07-12 MED ORDER — PROPOFOL 10 MG/ML IV BOLUS
INTRAVENOUS | Status: DC | PRN
  Administered 2015-07-12: 130 mg via INTRAVENOUS

## 2015-07-12 MED ORDER — HYDROMORPHONE HCL 1 MG/ML IJ SOLN
INTRAMUSCULAR | Status: DC | PRN
  Administered 2015-07-12 (×2): 1 mg via INTRAVENOUS

## 2015-07-12 MED ORDER — OXYCODONE-ACETAMINOPHEN 5-325 MG PO TABS
1.0000 | ORAL_TABLET | ORAL | Status: DC | PRN
  Administered 2015-07-12 (×2): 1 via ORAL
  Administered 2015-07-13 (×4): 2 via ORAL
  Administered 2015-07-14: 1 via ORAL
  Administered 2015-07-14: 2 via ORAL
  Administered 2015-07-14: 1 via ORAL
  Administered 2015-07-14: 2 via ORAL
  Administered 2015-07-14: 1 via ORAL
  Administered 2015-07-15 (×3): 2 via ORAL
  Filled 2015-07-12 (×2): qty 2
  Filled 2015-07-12: qty 1
  Filled 2015-07-12: qty 2
  Filled 2015-07-12 (×3): qty 1
  Filled 2015-07-12 (×2): qty 2
  Filled 2015-07-12: qty 1
  Filled 2015-07-12 (×2): qty 2
  Filled 2015-07-12 (×2): qty 1
  Filled 2015-07-12: qty 2

## 2015-07-12 MED ORDER — METOCLOPRAMIDE HCL 5 MG/ML IJ SOLN
INTRAMUSCULAR | Status: AC
  Filled 2015-07-12: qty 2

## 2015-07-12 MED ORDER — PROPOFOL 10 MG/ML IV BOLUS
INTRAVENOUS | Status: AC
  Filled 2015-07-12: qty 20

## 2015-07-12 MED ORDER — FENTANYL CITRATE (PF) 100 MCG/2ML IJ SOLN
INTRAMUSCULAR | Status: DC | PRN
Start: 2015-07-12 — End: 2015-07-12
  Administered 2015-07-12 (×5): 50 ug via INTRAVENOUS

## 2015-07-12 MED ORDER — CEFOTETAN DISODIUM-DEXTROSE 2-2.08 GM-% IV SOLR
2.0000 g | INTRAVENOUS | Status: AC
  Administered 2015-07-12: 2 g via INTRAVENOUS

## 2015-07-12 MED ORDER — BUPIVACAINE-EPINEPHRINE 0.25% -1:200000 IJ SOLN
INTRAMUSCULAR | Status: DC | PRN
  Administered 2015-07-12: 50 mL

## 2015-07-12 MED ORDER — ROCURONIUM BROMIDE 100 MG/10ML IV SOLN
INTRAVENOUS | Status: DC | PRN
  Administered 2015-07-12: 50 mg via INTRAVENOUS

## 2015-07-12 MED ORDER — ONDANSETRON HCL 4 MG/2ML IJ SOLN
INTRAMUSCULAR | Status: AC
  Filled 2015-07-12: qty 2

## 2015-07-12 MED ORDER — MIDAZOLAM HCL 2 MG/2ML IJ SOLN
INTRAMUSCULAR | Status: AC
  Filled 2015-07-12: qty 2

## 2015-07-12 MED ORDER — SENNOSIDES-DOCUSATE SODIUM 8.6-50 MG PO TABS
1.0000 | ORAL_TABLET | Freq: Every day | ORAL | Status: DC
  Administered 2015-07-12 – 2015-07-13 (×2): 1 via ORAL
  Filled 2015-07-12 (×4): qty 1

## 2015-07-12 MED ORDER — FENTANYL CITRATE (PF) 250 MCG/5ML IJ SOLN
INTRAMUSCULAR | Status: AC
  Filled 2015-07-12: qty 5

## 2015-07-12 SURGICAL SUPPLY — 85 items
APPLIER CLIP 5 13 M/L LIGAMAX5 (MISCELLANEOUS)
APPLIER CLIP ROT 10 11.4 M/L (STAPLE)
BLADE EXTENDED COATED 6.5IN (ELECTRODE) ×3 IMPLANT
BLADE HEX COATED 2.75 (ELECTRODE) IMPLANT
BLADE SURG SZ10 CARB STEEL (BLADE) IMPLANT
CHLORAPREP W/TINT 26ML (MISCELLANEOUS) ×3 IMPLANT
CLIP APPLIE 5 13 M/L LIGAMAX5 (MISCELLANEOUS) IMPLANT
CLIP APPLIE ROT 10 11.4 M/L (STAPLE) IMPLANT
CLOSURE WOUND 1/2 X4 (GAUZE/BANDAGES/DRESSINGS)
COVER MAYO STAND STRL (DRAPES) ×3 IMPLANT
COVER SURGICAL LIGHT HANDLE (MISCELLANEOUS) IMPLANT
DECANTER SPIKE VIAL GLASS SM (MISCELLANEOUS) ×3 IMPLANT
DRAPE LAPAROSCOPIC ABDOMINAL (DRAPES) ×3 IMPLANT
DRAPE UTILITY XL STRL (DRAPES) ×3 IMPLANT
DRAPE WARM FLUID 44X44 (DRAPE) ×3 IMPLANT
ELECT PENCIL ROCKER SW 15FT (MISCELLANEOUS) IMPLANT
ELECT REM PT RETURN 9FT ADLT (ELECTROSURGICAL) ×3
ELECTRODE REM PT RTRN 9FT ADLT (ELECTROSURGICAL) ×1 IMPLANT
FILTER SMOKE EVAC LAPAROSHD (FILTER) IMPLANT
GAUZE SPONGE 4X4 12PLY STRL (GAUZE/BANDAGES/DRESSINGS) ×3 IMPLANT
GLOVE BIO SURGEON STRL SZ 6.5 (GLOVE) ×4 IMPLANT
GLOVE BIO SURGEON STRL SZ7 (GLOVE) ×3 IMPLANT
GLOVE BIO SURGEON STRL SZ7.5 (GLOVE) ×3 IMPLANT
GLOVE BIO SURGEONS STRL SZ 6.5 (GLOVE) ×2
GLOVE BIOGEL M 7.0 STRL (GLOVE) ×3 IMPLANT
GLOVE BIOGEL M STRL SZ7.5 (GLOVE) IMPLANT
GLOVE BIOGEL PI IND STRL 7.0 (GLOVE) ×8 IMPLANT
GLOVE BIOGEL PI INDICATOR 7.0 (GLOVE) ×16
GLOVE INDICATOR 8.0 STRL GRN (GLOVE) IMPLANT
GOWN L4 XXLG W/PAP TWL (GOWN DISPOSABLE) ×3 IMPLANT
GOWN STRL REUS W/ TWL XL LVL3 (GOWN DISPOSABLE) ×1 IMPLANT
GOWN STRL REUS W/TWL LRG LVL3 (GOWN DISPOSABLE) ×6 IMPLANT
GOWN STRL REUS W/TWL XL LVL3 (GOWN DISPOSABLE) ×2
HOLDER FOLEY CATH W/STRAP (MISCELLANEOUS) ×3 IMPLANT
KIT BASIN OR (CUSTOM PROCEDURE TRAY) ×3 IMPLANT
LIGASURE IMPACT 36 18CM CVD LR (INSTRUMENTS) IMPLANT
LIQUID BAND (GAUZE/BANDAGES/DRESSINGS) IMPLANT
MANIFOLD NEPTUNE II (INSTRUMENTS) IMPLANT
NS IRRIG 1000ML POUR BTL (IV SOLUTION) ×3 IMPLANT
PACK GENERAL/GYN (CUSTOM PROCEDURE TRAY) ×3 IMPLANT
PAD POSITIONING PINK XL (MISCELLANEOUS) ×3 IMPLANT
PAD TELFA 2X3 NADH STRL (GAUZE/BANDAGES/DRESSINGS) ×3 IMPLANT
PORT LAP GEL ALEXIS MED 5-9CM (MISCELLANEOUS) ×3 IMPLANT
RELOAD PROXIMATE TA60MM BLUE (ENDOMECHANICALS) ×3 IMPLANT
SET IRRIG TUBING LAPAROSCOPIC (IRRIGATION / IRRIGATOR) IMPLANT
SHEARS HARMONIC ACE PLUS 36CM (ENDOMECHANICALS) IMPLANT
SOLUTION ANTI FOG 6CC (MISCELLANEOUS) IMPLANT
SPONGE LAP 18X18 X RAY DECT (DISPOSABLE) ×3 IMPLANT
STAPLER GUN LINEAR PROX 60 (STAPLE) ×3 IMPLANT
STAPLER PROXIMATE 75MM BLUE (STAPLE) ×3 IMPLANT
STAPLER VISISTAT 35W (STAPLE) IMPLANT
STRIP CLOSURE SKIN 1/2X4 (GAUZE/BANDAGES/DRESSINGS) IMPLANT
SUCTION POOLE TIP (SUCTIONS) ×3 IMPLANT
SUT NOVA NAB DX-16 0-1 5-0 T12 (SUTURE) ×6 IMPLANT
SUT PDS AB 1 CTX 36 (SUTURE) IMPLANT
SUT PDS AB 1 TP1 96 (SUTURE) IMPLANT
SUT PROLENE 2 0 BLUE (SUTURE) IMPLANT
SUT PROLENE 2 0 KS (SUTURE) IMPLANT
SUT PROLENE 2 0 SH DA (SUTURE) IMPLANT
SUT SILK 2 0 (SUTURE)
SUT SILK 2 0 SH CR/8 (SUTURE) IMPLANT
SUT SILK 2 0SH CR/8 30 (SUTURE) IMPLANT
SUT SILK 2-0 18XBRD TIE 12 (SUTURE) IMPLANT
SUT SILK 2-0 30XBRD TIE 12 (SUTURE) IMPLANT
SUT SILK 3 0 (SUTURE)
SUT SILK 3 0 SH CR/8 (SUTURE) IMPLANT
SUT SILK 3-0 18XBRD TIE 12 (SUTURE) IMPLANT
SUT VIC AB 2-0 SH 27 (SUTURE) ×4
SUT VIC AB 2-0 SH 27X BRD (SUTURE) ×2 IMPLANT
SYS LAPSCP GELPORT 120MM (MISCELLANEOUS)
SYSTEM LAPSCP GELPORT 120MM (MISCELLANEOUS) IMPLANT
TAPE CLOTH SURG 4X10 WHT LF (GAUZE/BANDAGES/DRESSINGS) ×3 IMPLANT
TOWEL OR 17X26 10 PK STRL BLUE (TOWEL DISPOSABLE) ×6 IMPLANT
TOWEL OR NON WOVEN STRL DISP B (DISPOSABLE) ×3 IMPLANT
TRAY FOLEY W/METER SILVER 14FR (SET/KITS/TRAYS/PACK) IMPLANT
TRAY FOLEY W/METER SILVER 16FR (SET/KITS/TRAYS/PACK) ×3 IMPLANT
TRAY LAPAROSCOPIC (CUSTOM PROCEDURE TRAY) ×3 IMPLANT
TROCAR BLADELESS OPT 5 75 (ENDOMECHANICALS) IMPLANT
TROCAR XCEL 12X100 BLDLESS (ENDOMECHANICALS) IMPLANT
TROCAR XCEL BLUNT TIP 100MML (ENDOMECHANICALS) IMPLANT
TROCAR XCEL NON-BLD 11X100MML (ENDOMECHANICALS) IMPLANT
TROCAR XCEL UNIV SLVE 11M 100M (ENDOMECHANICALS) IMPLANT
TUBING INSUFFLATION 10FT LAP (TUBING) ×3 IMPLANT
YANKAUER SUCT BULB TIP 10FT TU (MISCELLANEOUS) IMPLANT
YANKAUER SUCT BULB TIP NO VENT (SUCTIONS) ×3 IMPLANT

## 2015-07-12 NOTE — Anesthesia Procedure Notes (Signed)
Procedure Name: Intubation Date/Time: 07/12/2015 7:39 AM Performed by: Deliah Boston Pre-anesthesia Checklist: Patient identified, Emergency Drugs available, Suction available and Patient being monitored Patient Re-evaluated:Patient Re-evaluated prior to inductionOxygen Delivery Method: Circle System Utilized Preoxygenation: Pre-oxygenation with 100% oxygen Intubation Type: IV induction Ventilation: Mask ventilation without difficulty Laryngoscope Size: Mac and 4 Grade View: Grade I Tube type: Oral Tube size: 7.5 mm Number of attempts: 1 Airway Equipment and Method: Oral airway Placement Confirmation: ETT inserted through vocal cords under direct vision,  positive ETCO2 and breath sounds checked- equal and bilateral Tube secured with: Tape Dental Injury: Teeth and Oropharynx as per pre-operative assessment

## 2015-07-12 NOTE — Addendum Note (Signed)
Addendum  created 07/12/15 1110 by Deliah Boston, CRNA   Modules edited: Charges VN

## 2015-07-12 NOTE — H&P (Signed)
The patient is a 34 year old male presenting for ileostomy closure. He is doing well.  He is tolerating a diet. Denies abd pain. Having rectal drainage. No problems with his ostomy.  PRIMARY CANCER OF RECTUM (154.1  C20) ANAL STRICTURE (569.2  K62.4)   Other Problems [name], MD; [id number] 12:56 PM) Rectal Cancer Hemorrhoids  Past Surgical History [name], MD; [id number] 12:56 PM) Colon Removal - Partial RECTAL EUA [phone]) 11/15/2014  Diagnostic Studies History [name], MD; [id number] 12:56 PM) Colonoscopy within last year  Allergies Elbert Ewings, CMA; 02/26/2015 12:01 PM) Azithromycin *CHEMICALS* Hives. Compazine *ANTIPSYCHOTICS/ANTIMANIC AGENTS* anxiety  Medication History Elbert Ewings, CMA; 02/26/2015 12:01 PM) Flagyl (500MG  Tablet, 2 (two) Tablet Oral SEE NOTE, Taken starting 04/25/2014) Active. (Take at 2pm, 3pm, and 10pm the day prior to your colon operation) Neomycin Sulfate (500MG  Tablet, 2 (two) Tablet Oral SEE NOTE, Taken starting 04/25/2014) Active. (TAKE TWO TABLETS AT 2 PM, 3 PM, AND 10 PM THE DAY PRIOR TO SURGERY) OxyCODONE HCl (5MG  Tablet, Oral prn) Active. Imodium A-D (2MG  Tablet, Oral prn) Active. Mylicon (40MG /0.6ML Suspension, Oral prn) Active. Ondansetron HCl (8MG  Tablet, Oral) Active. Hydrocortisone Acetate (25MG  Suppository, Rectal prn) Active. Polyethylene Glycol 3350 (Oral prn) Active. Acetaminophen (325MG  Tablet, Oral prn) Active. Ibuprofen (200MG  Capsule, Oral prn) Active. Sudafed (30MG  Tablet, Oral prn) Active. Medications Reconciled  Social History [name], MD; [id number] 12:56 PM) Alcohol use Occasional alcohol use. Tobacco use Former smoker. No caffeine use No drug use  Family History [name], MD; [id number] 12:56 PM) Thyroid problems Family Members In General. Heart disease in male family member before age 29 Hypertension Family Members In General. Breast Cancer Family  Members In General. Heart Disease Family Members In General. Heart disease in male family member before age 14 Arthritis Family Members In General.  BP 126/80 mmHg  Pulse 67  Temp(Src) 98 F (36.7 C) (Oral)  Resp 18  Ht 6\' 2"  (1.88 m)  Wt 74.844 kg (165 lb)  BMI 21.18 kg/m2  SpO2 99%   General Mental Status-Alert. General Appearance-Not in acute distress. Voice-Normal.  Integumentary Global Assessment Upon inspection and palpation of skin surfaces of the - Distribution of scalp and body hair is normal. General Characteristics Overall examination of the patient's skin reveals - no rashes and no suspicious lesions.  Eye Eyeball - Left-Extraocular movements intact, No Nystagmus. Eyeball - Right-Extraocular movements intact, No Nystagmus. Upper Eyelid - Left-No Cyanotic. Upper Eyelid - Right-No Cyanotic.  Chest and Lung Exam Inspection Accessory muscles - No use of accessory muscles in breathing.  Cardiovascular Cardiovascular examination reveals -normal heart sounds, regular rate and rhythm with no murmurs.  Abdomen Inspection Skin - Inspection of the skin of the abdomen reveals - Note: Incisions healing well. Palpation/Percussion Palpation and Percussion of the abdomen reveal - Soft and Non Tender. Note: oatomy pink.  Rectal Anorectal Exam Internal - Note: Stricture noted at anastomosis, patent  Neurologic Neurologic evaluation reveals -normal attention span and ability to concentrate, able to name objects and repeat phrases. Appropriate fund of knowledge and normal coordination.  Neuropsychiatric Mental status exam performed with findings of-able to articulate well with normal speech/language, rate, volume and coherence and no evidence of hallucinations, delusions, obsessions or homicidal/suicidal ideation. Orientation-oriented X3.  Musculoskeletal Global Assessment Gait and Station - normal gait and station.    Assessment & Plan  [name])  ANAL STRICTURE [phone]) Impression: 34 year old male with distal rectal cancer. He is status post ultralow anterior resection for this cancer  after neoadjuvant chemotherapy. He had a diverting loop ileostomy.  We have tried to slowly open up his anal stricture during several attempts in the OR.  He now has a patent anal canal with no leak on barium enema.  We have discussed potential risks which include bleeding, stricture, leak and infection.

## 2015-07-12 NOTE — Op Note (Signed)
07/12/2015  9:04 AM  PATIENT:  Seth Villanueva  34 y.o. male  Patient Care Team: Leonides Sake, MD as PCP - General (Family Medicine) Kyung Rudd, MD as Consulting Physician (Radiation Oncology) Ladell Pier, MD as Consulting Physician (Oncology) [name], MD as Consulting Physician (General Surgery)  PRE-OPERATIVE DIAGNOSIS:  rectal cancer, diverting ileostomy  POST-OPERATIVE DIAGNOSIS:  rectal cancer, diverting ileostomy  PROCEDURE:   ILEOSTOMY REVERSAL LAPAROSCOPY DIAGNOSTIC  Surgeon(s): [name], MD  ASSISTANT: none   ANESTHESIA:   general  EBL: 10ml  Total I/O In: -  Out: 30 [Urine:30]  DRAINS: none   SPECIMEN:  Source of Specimen:  ostomy  DISPOSITION OF SPECIMEN:  PATHOLOGY  COUNTS:  YES  PLAN OF CARE: Admit to inpatient   PATIENT DISPOSITION:  PACU - hemodynamically stable.  INDICATION: This is a 34 year old male who has a complicated operative history. He underwent a low anterior resection with coloanal anastomosis. This was complicated by anal stricture. I was unable to dilate this. He was taken back to the operating room on 2 different occasions for dilation. We were finally able to get the area open by doing a retrograde colonoscopy to find the proximal colon from below. He has now healed from that and is ready for ileostomy reversal. He did have a CT scan which was concerning for some disease in his left upper quadrant. We will plan on doing a diagnostic laparoscopy to rule out peritoneal metastases.   OR FINDINGS: No signs of peritoneal metastases in the upper abdomen. Patent anal canal.  DESCRIPTION: the patient was identified in the preoperative holding area and taken to the OR where they were laid supine on the operating room table.  General anesthesia was induced without difficulty.  The patient was placed in lithotomy position.  SCDs were also noted to be in place prior to the initiation of anesthesia.  I performed a digital rectal exam  which showed a patent anal canal. This was dilated slightly with my finger as proximally as I could. The patient was then prepped and draped in the usual sterile fashion.   A surgical timeout was performed indicating the correct patient, procedure, positioning and need for preoperative antibiotics.   I made an incision around the ileostomy site using Bovie electrocautery. Dissection was carried down to the level of the fascia using cautery as well. I then separated the ileostomy site from the fascia using careful dissection.  Once this was complete, I stapled the 2 limbs of the ileostomy using a TA blue load stapler. This was then dropped back into the abdomen with a stay suture and place. I placed an IT sales professional and. The abdomen was insufflated. I placed a camera in the abdomen and evaluated for any signs of peritoneal metastases, which were suggested as a possibility on his most recent CT scan. I evaluated the upper abdomen, liver and diaphragm. There were no signs of peritoneal metastases. The omentum was in place and had no signs of caking. The left upper quadrant appeared normal. I then desufflated the abdomen to remove the Jasper port. I then created an anastomosis between the 2 limbs of the ileostomy using a 75 mm blue load GIA stapler. The common enterotomy channel was closed using a TA blue load stapler. Hemostasis around the edges was controlled using interrupted 3-0 silk sutures. Once this was good, the anastomosis was placed back into the abdomen. I then closed the peritoneum using a 2-0 running Vicryl suture. The fascia was  then closed in a vertical manner using #1 interrupted Novafil sutures. The subcutaneous tissues were then closed using a pursestring 2-0 Vicryl suture. The dermal layer was closed using another 2-0 Vicryl pursestring suture. A Teflon wick was placed in the middle of the wound. A dressing was applied over this. The patient was awakened from anesthesia and sent to the  post anesthesia care unit in stable condition. All counts were correct per operating room staff.

## 2015-07-12 NOTE — Anesthesia Postprocedure Evaluation (Signed)
Anesthesia Post Note  Patient: Seth Villanueva  Procedure(s) Performed: Procedure(s) (LRB): ILEOSTOMY REVERSAL (N/A) LAPAROSCOPY DIAGNOSTIC (N/A)  Patient location during evaluation: PACU Anesthesia Type: General Level of consciousness: awake and alert Pain management: pain level controlled Vital Signs Assessment: post-procedure vital signs reviewed and stable Respiratory status: spontaneous breathing, nonlabored ventilation, respiratory function stable and patient connected to nasal cannula oxygen Cardiovascular status: blood pressure returned to baseline and stable Postop Assessment: no signs of nausea or vomiting Anesthetic complications: no    Last Vitals:  Filed Vitals:   07/12/15 1015 07/12/15 1040  BP: 143/95 137/84  Pulse: 73 73  Temp:  36.7 C  Resp: 15 15    Last Pain:  Filed Vitals:   07/12/15 1040  PainSc: 4                  Melissa Tomaselli,JAMES TERRILL

## 2015-07-12 NOTE — Transfer of Care (Signed)
Immediate Anesthesia Transfer of Care Note  Patient: Seth Villanueva  Procedure(s) Performed: Procedure(s): ILEOSTOMY REVERSAL (N/A) LAPAROSCOPY DIAGNOSTIC (N/A)  Patient Location: PACU  Anesthesia Type:General  Level of Consciousness: Patient easily awoken, sedated, comfortable, cooperative, following commands, responds to stimulation.   Airway & Oxygen Therapy: Patient spontaneously breathing, ventilating well, oxygen via simple oxygen mask.  Post-op Assessment: Report given to PACU RN, vital signs reviewed and stable, moving all extremities.   Post vital signs: Reviewed and stable.  Complications: No apparent anesthesia complications

## 2015-07-13 NOTE — Progress Notes (Signed)
Patient ID: Seth Villanueva, male   DOB: 04/10/1981, 34 y.o.   MRN: [id number]  [facility]  POD#: 1  Subjective: Patient up in bed, already ambulated, family at bedside.  Regular food for breakfast.  Objective: Vital signs in last 24 hours: Temp:  [98 F (36.7 C)-98.8 F (37.1 C)] 98.2 F (36.8 C) (12/03 0600) Pulse Rate:  [68-114] 72 (12/03 0600) Resp:  [15-16] 16 (12/03 0600) BP: (99-143)/(54-95) 99/54 mmHg (12/03 0600) SpO2:  [95 %-100 %] 99 % (12/03 0600)    Intake/Output from previous day: 12/02 0701 - 12/03 0700 In: 2805 [P.O.:120; I.V.:2685] Out: [id number] [Urine:1531] Intake/Output this shift:    Physical Exam: HEENT - sclerae clear, mucous membranes moist Neck - soft Chest - clear bilaterally Cor - RRR Abdomen - soft, mild distension; BS present; dressing removed - wick in subQ; serosanguinous on dressings - changed Ext - no edema, non-tender Neuro - alert & oriented, no focal deficits  Lab Results:  No results for input(s): WBC, HGB, HCT, PLT in the last 72 hours. BMET No results for input(s): NA, K, CL, CO2, GLUCOSE, BUN, CREATININE, CALCIUM in the last 72 hours. PT/INR No results for input(s): LABPROT, INR in the last 72 hours. Comprehensive Metabolic Panel:    Component Value Date/Time   NA 139 07/10/2015 0945   NA 135 04/23/2015 0536   NA 141 02/01/2015 0911   NA 141 11/12/2014 1005   K 3.6 07/10/2015 0945   K 3.5 04/23/2015 0536   K 3.8 02/01/2015 0911   K 3.9 11/12/2014 1005   CL 106 07/10/2015 0945   CL 100* 04/23/2015 0536   CO2 27 07/10/2015 0945   CO2 27 04/23/2015 0536   CO2 27 02/01/2015 0911   CO2 23 11/12/2014 1005   BUN 13 07/10/2015 0945   BUN 11 04/23/2015 0536   BUN 13.4 02/01/2015 0911   BUN 10.0 11/12/2014 1005   CREATININE 0.98 07/10/2015 0945   CREATININE 1.01 04/23/2015 0536   CREATININE 0.9 02/01/2015 0911   CREATININE 0.8 11/12/2014 1005   CREATININE 0.97 02/01/2014 1037   GLUCOSE  136* 07/10/2015 0945   GLUCOSE 131* 04/23/2015 0536   GLUCOSE 129 02/01/2015 0911   GLUCOSE 134 11/12/2014 1005   CALCIUM 8.8* 07/10/2015 0945   CALCIUM 8.5* 04/23/2015 0536   CALCIUM 8.8 02/01/2015 0911   CALCIUM 9.0 11/12/2014 1005   AST 29 11/17/2014 1958   AST 22 11/12/2014 1005   AST 32 10/01/2014 1041   AST 42* 08/28/2014 1915   ALT 31 11/17/2014 1958   ALT 26 11/12/2014 1005   ALT 45 10/01/2014 1041   ALT 58* 08/28/2014 1915   ALKPHOS 117 11/17/2014 1958   ALKPHOS 125 11/12/2014 1005   ALKPHOS 163* 10/01/2014 1041   ALKPHOS 158* 08/28/2014 1915   BILITOT 1.8* 11/17/2014 1958   BILITOT 0.54 11/12/2014 1005   BILITOT 0.40 10/01/2014 1041   BILITOT 0.8 08/28/2014 1915   PROT 8.4* 11/17/2014 1958   PROT 6.8 11/12/2014 1005   PROT 7.0 10/01/2014 1041   PROT 7.8 08/28/2014 1915   ALBUMIN 4.6 11/17/2014 1958   ALBUMIN 3.9 11/12/2014 1005   ALBUMIN 3.8 10/01/2014 1041   ALBUMIN 4.4 08/28/2014 1915    Studies/Results: No results found.  Anti-infectives: Anti-infectives    Start     Dose/Rate Route Frequency Ordered Stop   07/12/15 2000  cefoTEtan (CEFOTAN) 2 g in dextrose 5 % 50 mL IVPB     2  g 100 mL/hr over 30 Minutes Intravenous Every 12 hours 07/12/15 1046 07/12/15 2048   07/12/15 0611  cefoTEtan in Dextrose 5% (CEFOTAN) IVPB 2 g     2 g Intravenous On call to O.R. 07/12/15 0611 07/12/15 0746      Assessment & Plans: Status post ileostomy closure  Leave wick in wound - change external dressings BID and prn  Advanced diet - monitor  OOB, ambulate  Anticipating discharge on Monday 12/5  Earnstine Regal, MD, [facility] Office: [place] 07/13/2015

## 2015-07-14 NOTE — Progress Notes (Signed)
Utilization Review Completed.Seth Villanueva T12/11/2014  

## 2015-07-14 NOTE — Progress Notes (Signed)
Patient ID: Seth Villanueva, male   DOB: 07/04/1981, 34 y.o.   MRN: [id number]  [facility]  POD#: 2  Subjective: Patient comfortable, having BM's.  Tolerating regular diet.  Family at bedside.  Objective: Vital signs in last 24 hours: Temp:  [98 F (36.7 C)-98.1 F (36.7 C)] 98.1 F (36.7 C) (12/04 0600) Pulse Rate:  [62-84] 84 (12/04 0600) Resp:  [16] 16 (12/04 0600) BP: (99-115)/(49-57) 99/49 mmHg (12/04 0600) SpO2:  [98 %-100 %] 100 % (12/04 0600) Last BM Date: 07/12/15  Intake/Output from previous day: 12/03 0701 - 12/04 0700 In: 600 [I.V.:600] Out: -  Intake/Output this shift:    Physical Exam: HEENT - sclerae clear, mucous membranes moist Neck - soft Abdomen - soft, dressing dry and intact Ext - no edema, non-tender Neuro - alert & oriented, no focal deficits  Lab Results:  No results for input(s): WBC, HGB, HCT, PLT in the last 72 hours. BMET No results for input(s): NA, K, CL, CO2, GLUCOSE, BUN, CREATININE, CALCIUM in the last 72 hours. PT/INR No results for input(s): LABPROT, INR in the last 72 hours. Comprehensive Metabolic Panel:    Component Value Date/Time   NA 139 07/10/2015 0945   NA 135 04/23/2015 0536   NA 141 02/01/2015 0911   NA 141 11/12/2014 1005   K 3.6 07/10/2015 0945   K 3.5 04/23/2015 0536   K 3.8 02/01/2015 0911   K 3.9 11/12/2014 1005   CL 106 07/10/2015 0945   CL 100* 04/23/2015 0536   CO2 27 07/10/2015 0945   CO2 27 04/23/2015 0536   CO2 27 02/01/2015 0911   CO2 23 11/12/2014 1005   BUN 13 07/10/2015 0945   BUN 11 04/23/2015 0536   BUN 13.4 02/01/2015 0911   BUN 10.0 11/12/2014 1005   CREATININE 0.98 07/10/2015 0945   CREATININE 1.01 04/23/2015 0536   CREATININE 0.9 02/01/2015 0911   CREATININE 0.8 11/12/2014 1005   CREATININE 0.97 02/01/2014 1037   GLUCOSE 136* 07/10/2015 0945   GLUCOSE 131* 04/23/2015 0536   GLUCOSE 129 02/01/2015 0911   GLUCOSE 134 11/12/2014 1005   CALCIUM 8.8*  07/10/2015 0945   CALCIUM 8.5* 04/23/2015 0536   CALCIUM 8.8 02/01/2015 0911   CALCIUM 9.0 11/12/2014 1005   AST 29 11/17/2014 1958   AST 22 11/12/2014 1005   AST 32 10/01/2014 1041   AST 42* 08/28/2014 1915   ALT 31 11/17/2014 1958   ALT 26 11/12/2014 1005   ALT 45 10/01/2014 1041   ALT 58* 08/28/2014 1915   ALKPHOS 117 11/17/2014 1958   ALKPHOS 125 11/12/2014 1005   ALKPHOS 163* 10/01/2014 1041   ALKPHOS 158* 08/28/2014 1915   BILITOT 1.8* 11/17/2014 1958   BILITOT 0.54 11/12/2014 1005   BILITOT 0.40 10/01/2014 1041   BILITOT 0.8 08/28/2014 1915   PROT 8.4* 11/17/2014 1958   PROT 6.8 11/12/2014 1005   PROT 7.0 10/01/2014 1041   PROT 7.8 08/28/2014 1915   ALBUMIN 4.6 11/17/2014 1958   ALBUMIN 3.9 11/12/2014 1005   ALBUMIN 3.8 10/01/2014 1041   ALBUMIN 4.4 08/28/2014 1915    Studies/Results: No results found.  Anti-infectives: Anti-infectives    Start     Dose/Rate Route Frequency Ordered Stop   07/12/15 2000  cefoTEtan (CEFOTAN) 2 g in dextrose 5 % 50 mL IVPB     2 g 100 mL/hr over 30 Minutes Intravenous Every 12 hours 07/12/15 1046 07/12/15 2048   07/12/15 [id number]  cefoTEtan in Dextrose 5% (CEFOTAN) IVPB 2 g     2 g Intravenous On call to O.R. 07/12/15 0611 07/12/15 0746      Assessment & Plans: Status post ileostomy closure  Regular diet  Dressing changes BID and prn - wick in place  Patient anticipating discharge home tomorrow.  Earnstine Regal, MD, [facility] Office: [place] 07/14/2015

## 2015-07-15 MED ORDER — OXYCODONE-ACETAMINOPHEN 5-325 MG PO TABS: 1.0000 | ORAL_TABLET | Freq: Four times a day (QID) | ORAL | Status: DC | PRN

## 2015-07-15 NOTE — Discharge Instructions (Signed)
ABDOMINAL SURGERY: POST OP INSTRUCTIONS  1. DIET: Follow a light bland diet the first 24 hours after arrival home, such as soup, liquids, crackers, etc.  Be sure to include lots of fluids daily.  Avoid fast food or heavy meals as your are more likely to get nauseated.  Do not eat any uncooked fruits or vegetables for the next 2 weeks as your colon heals. 2. Take your usually prescribed home medications unless otherwise directed. 3. PAIN CONTROL: a. Pain is best controlled by a usual combination of three different methods TOGETHER: i. Ice/Heat ii. Over the counter pain medication iii. Prescription pain medication b. Most patients will experience some swelling and bruising around the incisions.  Ice packs or heating pads (30-60 minutes up to 6 times a day) will help. Use ice for the first few days to help decrease swelling and bruising, then switch to heat to help relax tight/sore spots and speed recovery.  Some people prefer to use ice alone, heat alone, alternating between ice & heat.  Experiment to what works for you.  Swelling and bruising can take several weeks to resolve.   c. It is helpful to take an over-the-counter pain medication regularly for the first few weeks.  Choose one of the following that works best for you: i. Naproxen (Aleve, etc)  Two 220mg  tabs twice a day ii. Ibuprofen (Advil, etc) Three 200mg  tabs four times a day (every meal & bedtime) iii. Acetaminophen (Tylenol, etc) 500-650mg  four times a day (every meal & bedtime) d. A  prescription for pain medication (such as oxycodone, hydrocodone, etc) should be given to you upon discharge.  Take your pain medication as prescribed.  i. If you are having problems/concerns with the prescription medicine (does not control pain, nausea, vomiting, rash, itching, etc), please call us [phone] to see if we need to switch you to a different pain medicine that will work better for you and/or control your side effect better. ii. If you  need a refill on your pain medication, please contact your pharmacy.  They will contact our office to request authorization. Prescriptions will not be filled after 5 pm or on week-ends. 4. Avoid getting constipated.  Between the surgery and the pain medications, it is common to experience some constipation.  Increasing fluid intake and taking a fiber supplement (such as Metamucil, Citrucel, FiberCon, MiraLax, etc) 1-2 times a day regularly will usually help prevent this problem from occurring.  A mild laxative (prune juice, Milk of Magnesia, MiraLax, etc) should be taken according to package directions if there are no bowel movements after 48 hours.   5. Watch out for diarrhea.  If you have many loose bowel movements, simplify your diet to bland foods & liquids for a few days.  Stop any stool softeners and decrease your fiber supplement.  Switching to mild anti-diarrheal medications (Kayopectate, Pepto Bismol) can help.  If this worsens or does not improve, please call us. 6. Wash / shower every day.  You may shower over the incision / wound.  Avoid baths until the skin is fully healed. 7. Keep your wound covered.  Wash with soap and water and cover with a clean bandage daily and as needed 8. ACTIVITIES as tolerated:   a. You may resume regular (light) daily activities beginning the next day--such as daily self-care, walking, climbing stairs--gradually increasing activities as tolerated.  If you can walk 30 minutes without difficulty, it is safe to try more intense activity such as jogging, treadmill, bicycling,  low-impact aerobics, swimming, etc. b. Save the most intensive and strenuous activity for last such as sit-ups, heavy lifting, contact sports, etc  Refrain from any heavy lifting or straining until you are off narcotics for pain control.   c. DO NOT PUSH THROUGH PAIN.  Let pain be your guide: If it hurts to do something, don't do it.  Pain is your body warning you to avoid that activity for another  week until the pain goes down. d. You may drive when you are no longer taking prescription pain medication, you can comfortably wear a seatbelt, and you can safely maneuver your car and apply brakes. e. Dennis Bast may have sexual intercourse when it is comfortable.  9. FOLLOW UP in our office a. Please call [facility] at [phone] to set up an appointment to see your surgeon in the office for a follow-up appointment approximately 1-2 weeks after your surgery. b. Make sure that you call for this appointment the day you arrive home to insure a convenient appointment time. 10. IF YOU HAVE DISABILITY OR FAMILY LEAVE FORMS, BRING THEM TO THE OFFICE FOR PROCESSING.  DO NOT GIVE THEM TO YOUR DOCTOR.   WHEN TO CALL us [phone]: 1. Poor pain control 2. Reactions / problems with new medications (rash/itching, nausea, etc)  3. Fever over 101.5 F (38.5 C) 4. Inability to urinate 5. Nausea and/or vomiting 6. Worsening swelling or bruising 7. Continued bleeding from incision. 8. Increased pain, redness, or drainage from the incision  The clinic staff is available to answer your questions during regular business hours (8:30am-5pm).  Please dont hesitate to call and ask to speak to one of our nurses for clinical concerns.   A surgeon from [facility] is always on call at the hospitals   If you have a medical emergency, go to the nearest emergency room or call 911.    [facility], [address], [place], [place], [place]  [address] ? MAIN: [phone] ? TOLL FREE: [phone] ? FAX [phone] [email or web address]

## 2015-07-15 NOTE — Discharge Summary (Signed)
Physician Discharge Summary  Patient ID: Seth Villanueva MRN: [id number] DOB/AGE: 32-18-1982 32 y.o.  Admit date: 07/12/2015 Discharge date: 07/15/2015  Admission Diagnoses: ileostomy  Discharge Diagnoses:  Active Problems:   Ileostomy status [facility])   Discharged Condition: good  Hospital Course: Patient was admitted after ileostomy reversal.  His diet was advanced as tolerated.  He gan to have bowel function.  By POD 3 he was tolerating a diet and had good control of his pain.    Consults: None  Significant Diagnostic Studies: labs: cbc, chem  Treatments: IV hydration, analgesia: acetaminophen w/ codeine and surgery: see above  Discharge Exam: Blood pressure 101/57, pulse 65, temperature 98.3 F (36.8 C), temperature source Oral, resp. rate 16, height 6\' 2"  (1.88 m), weight 74.844 kg (165 lb), SpO2 99 %. General appearance: alert and cooperative GI: normal findings: soft, non-tender Incision/Wound: clean, wick removed  Disposition: 01-Home or Self Care     Medication List    STOP taking these medications        IMODIUM A-D PO      TAKE these medications        acetaminophen 325 MG tablet  Commonly known as:  TYLENOL  Take 325 mg by mouth once as needed for mild pain (pain).     NON FORMULARY  Take 2 tablets by mouth 2 (two) times daily. 1-3D Beta Glucan - Immune modulator     oxyCODONE-acetaminophen 5-325 MG tablet  Commonly known as:  PERCOCET/ROXICET  Take 1-2 tablets by mouth every 6 (six) hours as needed for severe pain.     pseudoephedrine 60 MG tablet  Commonly known as:  SUDAFED  Take 60 mg by mouth every 4 (four) hours as needed for congestion.           Follow-up Information    Follow up with [name]., MD. Schedule an appointment as soon as possible for a visit in 2 weeks.   Specialty:  General Surgery   Contact information:   [address] [place] [address] [phone]       Signed: [name] [id number], 9:29 AM

## 2015-07-19 ENCOUNTER — Other Ambulatory Visit: Payer: 59

## 2015-07-19 ENCOUNTER — Ambulatory Visit: Payer: 59 | Admitting: Oncology

## 2015-07-24 ENCOUNTER — Telehealth: Payer: Self-pay | Admitting: Oncology

## 2015-07-24 NOTE — Telephone Encounter (Signed)
pt called to r/s missed appt.....pt ok and aware °

## 2015-07-29 ENCOUNTER — Encounter: Payer: Self-pay | Admitting: Oncology

## 2015-07-29 ENCOUNTER — Telehealth: Payer: Self-pay | Admitting: Oncology

## 2015-07-29 ENCOUNTER — Ambulatory Visit (HOSPITAL_BASED_OUTPATIENT_CLINIC_OR_DEPARTMENT_OTHER): Payer: 59 | Admitting: Oncology

## 2015-07-29 ENCOUNTER — Other Ambulatory Visit: Payer: 59

## 2015-07-29 VITALS — BP 122/75 | HR 83 | Temp 97.8°F | Resp 18 | Ht 74.0 in | Wt 158.2 lb

## 2015-07-29 DIAGNOSIS — G620 Drug-induced polyneuropathy: Secondary | ICD-10-CM

## 2015-07-29 DIAGNOSIS — Z85048 Personal history of other malignant neoplasm of rectum, rectosigmoid junction, and anus: Secondary | ICD-10-CM

## 2015-07-29 DIAGNOSIS — C20 Malignant neoplasm of rectum: Secondary | ICD-10-CM

## 2015-07-29 NOTE — Telephone Encounter (Signed)
Gave patient avs report and appointments for January thru July  °

## 2015-07-29 NOTE — Addendum Note (Signed)
Addended by: Randolm Idol on: 07/29/2015 02:32 PM   Modules accepted: Medications

## 2015-07-29 NOTE — Progress Notes (Signed)
  [facility] OFFICE PROGRESS NOTE   Diagnosis:  Rectal cancer  INTERVAL HISTORY:   Mr. Carrier returns as scheduled. Had an ileostomy takedown procedure performed on 07/12/2015. He feels well. He denies pain. He has a good appetite. Notes a small amount of blood in his stool which is slowly getting better. He reports frequent bowel movements which are not loose, but indicates that he does not feel as though he completely empties out when he has a bowel movement. No shortness of breath. Patient plans to go back to work in early January 2017.  Objective:  Vital signs in last 24 hours:  Blood pressure 122/75, pulse 83, temperature 97.8 F (36.6 C), temperature source Oral, resp. rate 18, height '6\' 2"'$  (1.88 m), weight 158 lb 3.2 oz (71.759 kg), SpO2 100 %.    HEENT: No thrush or ulcers. Lymphatics: No palpable cervical, supraclavicular, axillary or inguinal lymph nodes. Resp: Lungs clear bilaterally. Cardio: Regular rate and rhythm. GI: Abdomen soft and nontender. No hepatomegaly. Right lower quadrant ileostomy. No mass. Vascular: No leg edema. Port-A-Cath without erythema.   Lab Results:  Lab Results  Component Value Date   WBC 4.7 07/10/2015   HGB 14.2 07/10/2015   HCT 40.8 07/10/2015   MCV 91.3 07/10/2015   PLT 142* 07/10/2015   NEUTROABS 8.5* 04/22/2015    Imaging:  No results found.  Medications: I have reviewed the patient's current medications.  Assessment/Plan: 1. 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 Completion of radiation and  concurrent Xeloda 03/28/2014.  Low anterior resection, diverting loop ileostomy, and coloanal anastomosis 05/18/2014,ypT2,ypN2, no loss of mismatch repair protein expression  Cycle 1 adjuvant FOLFOX 06/11/2014  Cycle 2 adjuvant FOLFOX 06/25/2014  Cycle 3 adjuvant FOLFOX 07/09/2014  Cycle 4 adjuvant FOLFOX 07/23/2014  Cycle 5 adjuvant FOLFOX 08/06/2014  Cycle 6 adjuvant FOLFOX 08/20/2014  Cycle 7 adjuvant FOLFOX 09/03/2014  Cycle 8 adjuvant FOLFOX 09/17/2014-oxaliplatin held  Cycle 9 adjuvant FOLFOX 10/01/2014-oxaliplatin held  CT scans chest/abdomen/pelvis 01/31/2015 with no evidence of recurrence. No liver metastatic disease or pathologic adenopathy identified. 2. History of rectal pain secondary to #1. Improved. 3. Phlebitis right upper inner arm 08/20/2014. Resolved. 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 6. Ileostomy takedown procedure 07/12/2015.   Disposition: Mr. Heritage appears stable. He is in clinical remission from rectal cancer. We will follow-up on the CEA from today. He will return for a follow-up visit and CEA in 6 months.   The Port-A-Cath is being flushed today. He will return for the next flush in 6 weeks. He will coordinate timing of Port-A-Cath removal with Dr. Marcello Moores. She will follow-up Dr. Marcello Moores in the next few weeks.   Mikey Bussing, DNP, AGPCNP-BC, AOCNP  07/29/2015  2:13 PM

## 2015-07-30 ENCOUNTER — Telehealth: Payer: Self-pay | Admitting: *Deleted

## 2015-07-30 LAB — CEA: CEA: 0.8 ng/mL (ref 0.0–5.0)

## 2015-07-30 NOTE — Telephone Encounter (Signed)
-----   Message from Ladell Pier, MD sent at 07/30/2015  9:03 AM EST ----- Please call patient, cea is normal

## 2015-07-30 NOTE — Telephone Encounter (Signed)
Per Dr. Sherrill; notified pt that cea is normal.  Pt verbalized understanding and expressed appreciation for call. 

## 2015-08-06 IMAGING — CT CT CHEST W/ CM
2 of 4 series · 15 of 36 positions shown, 18 images · IV contrast (OMNIPAQUE)
Comparison: Multiple exams, including 02/01/2014 and 11/17/2014

CLINICAL DATA: Rectal cancer. Colostomy. Prior radiation therapy
and chemotherapy. Restaging.

EXAM:
CT CHEST, ABDOMEN, AND PELVIS WITH CONTRAST
TECHNIQUE: Multidetector CT imaging of the chest, abdomen and pelvis was
performed following the standard protocol during bolus
administration of intravenous contrast.
CONTRAST:  100mL OMNIPAQUE IOHEXOL 300 MG/ML  SOLN

[Series 2: cap with st · axial · 0.73mm/px · z∈[-723,-98]mm · 12 of 141 slices shown, 15 images]
[im 8/141  mediastinal]
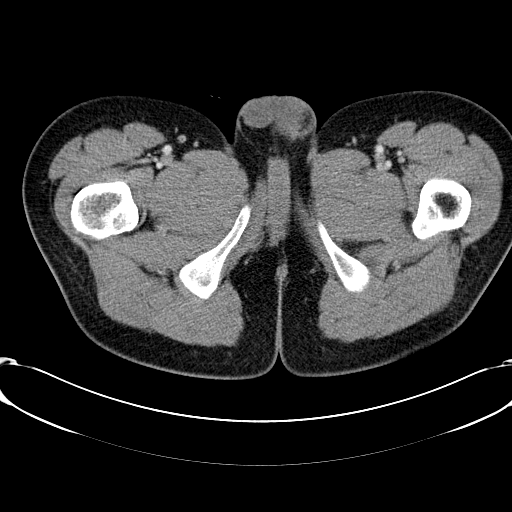
[im 8/141  lung]
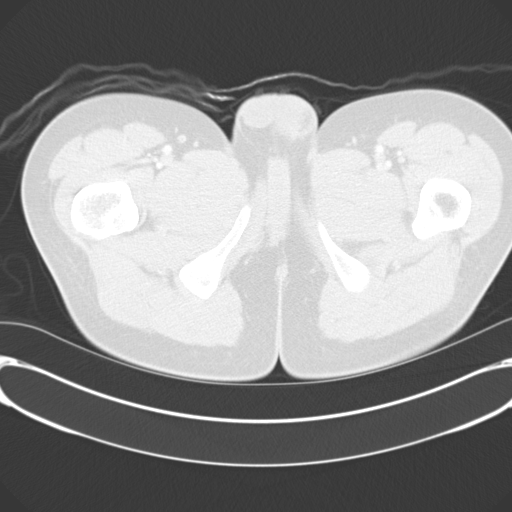
[im 24/141  lung]
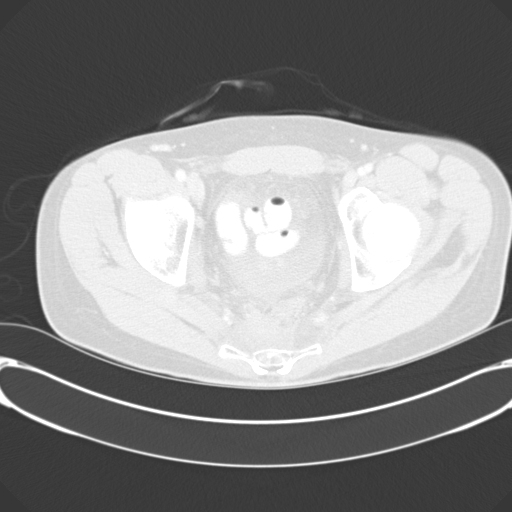
[im 32/141  lung]
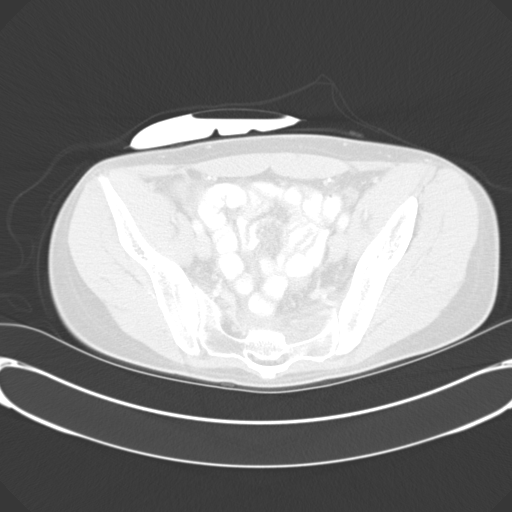
[im 39/141  lung]
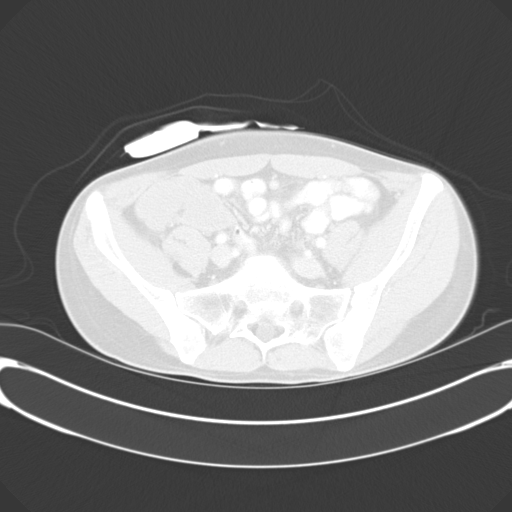
[im 55/141  mediastinal]
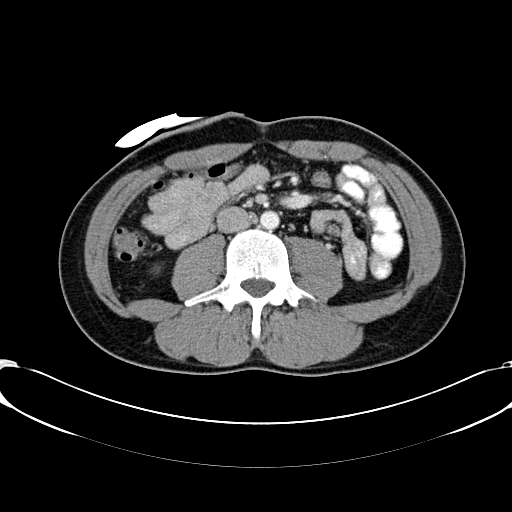
[im 55/141  lung]
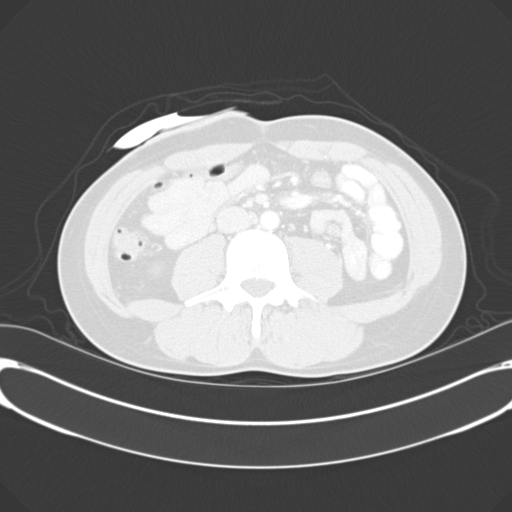
[im 63/141  lung]
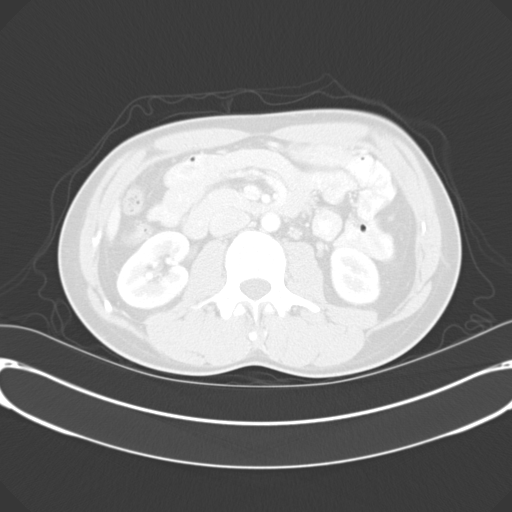
[im 78/141  lung]
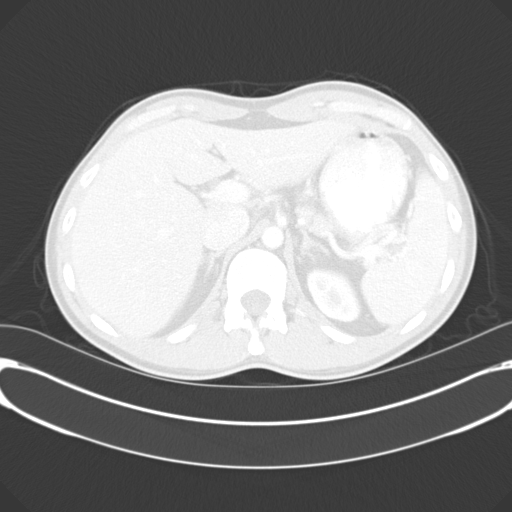
[im 86/141  lung]
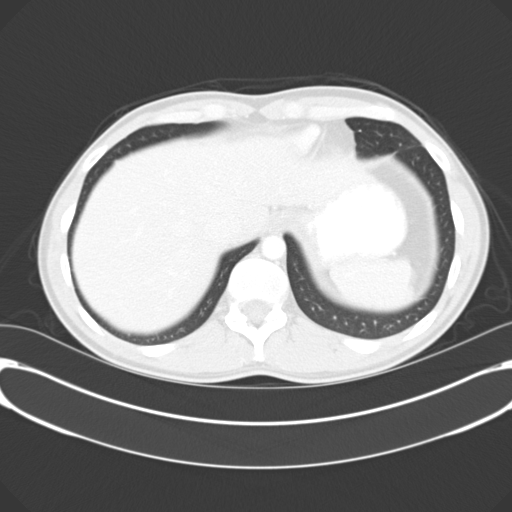
[im 102/141  mediastinal]
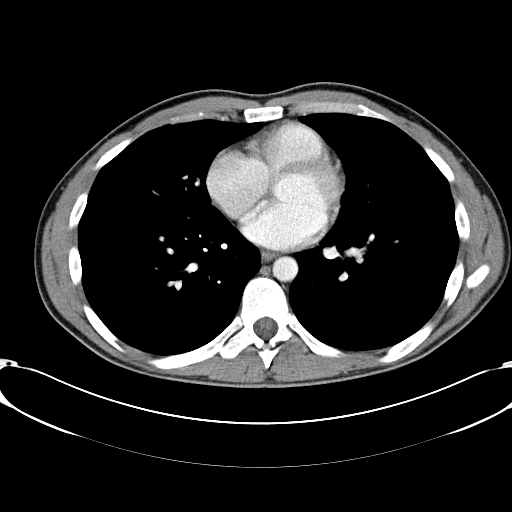
[im 102/141  lung]
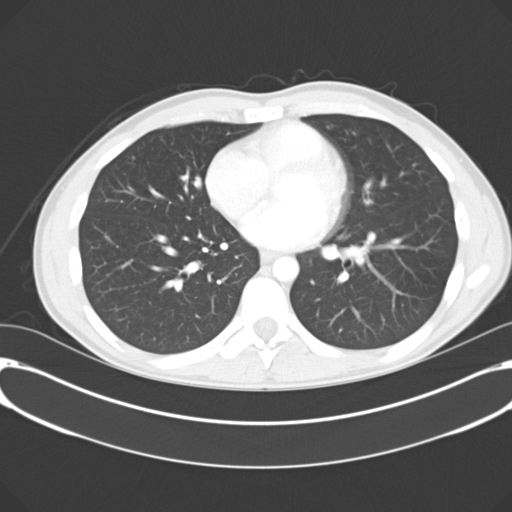
[im 109/141  lung]
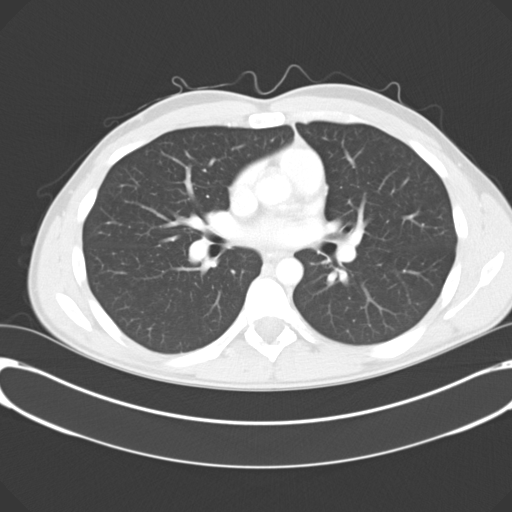
[im 117/141  lung]
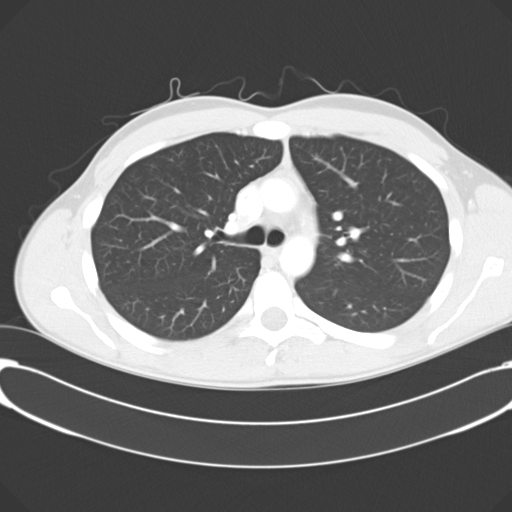
[im 133/141  lung]
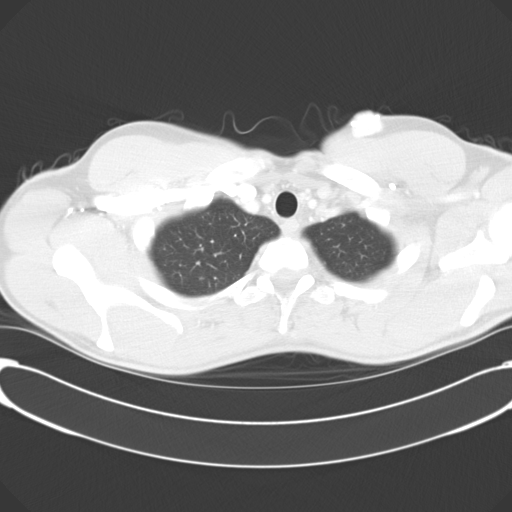

[Series 602: cor · coronal · 1.41mm/px · 3 of 74 slices shown]
[im 15/74  lung]
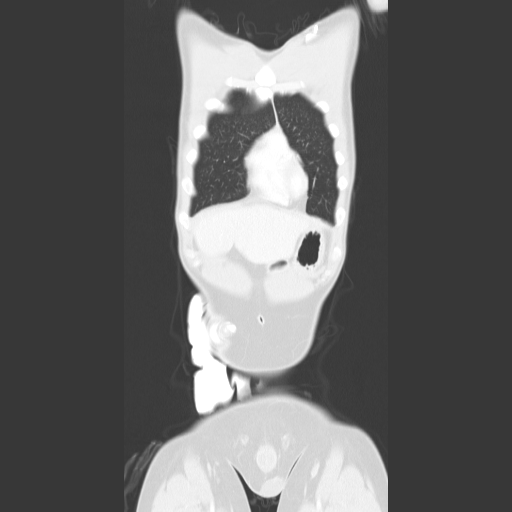
[im 30/74  lung]
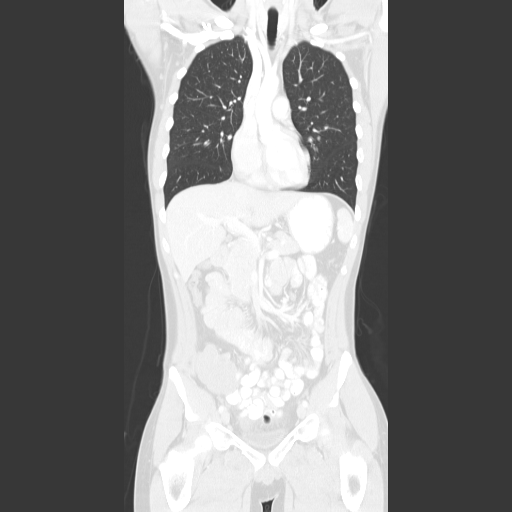
[im 44/74  lung]
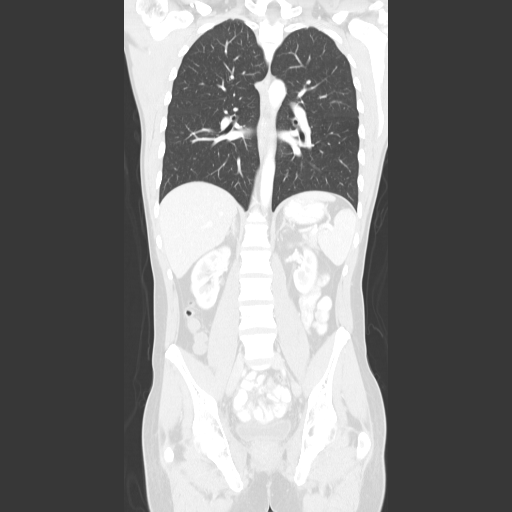

[15 of 36 positions shown; findings below may reference images not displayed]

FINDINGS: CT chest FINDINGS

Mediastinum/Nodes: Left-sided Port-A-Cath, tip in the SVC. No
adenopathy.

Lungs/Pleura: Unremarkable

Musculoskeletal: Unremarkable

CT ABDOMEN PELVIS FINDINGS

Hepatobiliary: Mildly contracted gallbladder.

Pancreas: Unremarkable

Spleen: Unremarkable

Adrenals/Urinary Tract: Unremarkable

Stomach/Bowel: Right lower quadrant diverting loop ileostomy. No
dilated bowel. Poor definition of the presumed postoperative region
of the rectum, with lack of regional fat planes and presacral
stranding. The localized extraluminal gas previously in this
vicinity on the CT from 11/17/2014 is no longer present. The rectum
is indistinct in the vicinity of the anastomosis.

Vascular/Lymphatic: Small retroperitoneal lymph nodes are not
pathologically enlarged by size criteria. There stranding in the
perirectal space making it difficult to assess for perirectal lymph
nodes certainly some small perirectal lymph nodes are present, short
axis diameter in the 4 mm range, similar to prior.

Reproductive: The seminal vesicles are indistinct due to poor
definition of surrounding fat planes, probably from radiotherapy.
Similarly the prostate gland margins are somewhat indistinct.

Other: No supplemental non-categorized findings.

Musculoskeletal: Unremarkable
IMPRESSION: 1. No positive evidence of recurrence. Poor definition of fat planes
and indistinct density in the presacral space and perirectal space
likely attributable to radiation therapy and partially attributable
to prior surgery. Negative predictive value for early local
recurrence is fairly low due to the indistinctness of tissue planes
in this vicinity. No liver metastatic disease or pathologic
adenopathy identified.
2. Right lower quadrant diverting loop ileostomy.

## 2015-09-13 ENCOUNTER — Ambulatory Visit (HOSPITAL_BASED_OUTPATIENT_CLINIC_OR_DEPARTMENT_OTHER): Payer: 59

## 2015-09-13 VITALS — BP 124/72 | HR 85 | Temp 97.5°F | Resp 18

## 2015-09-13 DIAGNOSIS — Z85048 Personal history of other malignant neoplasm of rectum, rectosigmoid junction, and anus: Secondary | ICD-10-CM

## 2015-09-13 DIAGNOSIS — Z452 Encounter for adjustment and management of vascular access device: Secondary | ICD-10-CM | POA: Diagnosis not present

## 2015-09-13 DIAGNOSIS — Z95828 Presence of other vascular implants and grafts: Secondary | ICD-10-CM

## 2015-09-13 MED ORDER — SODIUM CHLORIDE 0.9% FLUSH
10.0000 mL | INTRAVENOUS | Status: DC | PRN
Start: 2015-09-13 — End: 2015-09-13
  Administered 2015-09-13: 10 mL via INTRAVENOUS
  Filled 2015-09-13: qty 10

## 2015-09-13 MED ORDER — HEPARIN SOD (PORK) LOCK FLUSH 100 UNIT/ML IV SOLN
500.0000 [IU] | Freq: Once | INTRAVENOUS | Status: AC
  Administered 2015-09-13: 500 [IU] via INTRAVENOUS
  Filled 2015-09-13: qty 5

## 2015-09-13 NOTE — Patient Instructions (Signed)

## 2015-10-27 IMAGING — CR DG ABD PORTABLE 2V
1 series · 2 of 2 positions shown · non-contrast
Comparison: CT 04/22/2015.

CLINICAL DATA: Small-bowel obstruction

EXAM:
PORTABLE ABDOMEN - 2 VIEW

[Series 1: AP · U · 2 of 2 slices shown]
[im 1/2]
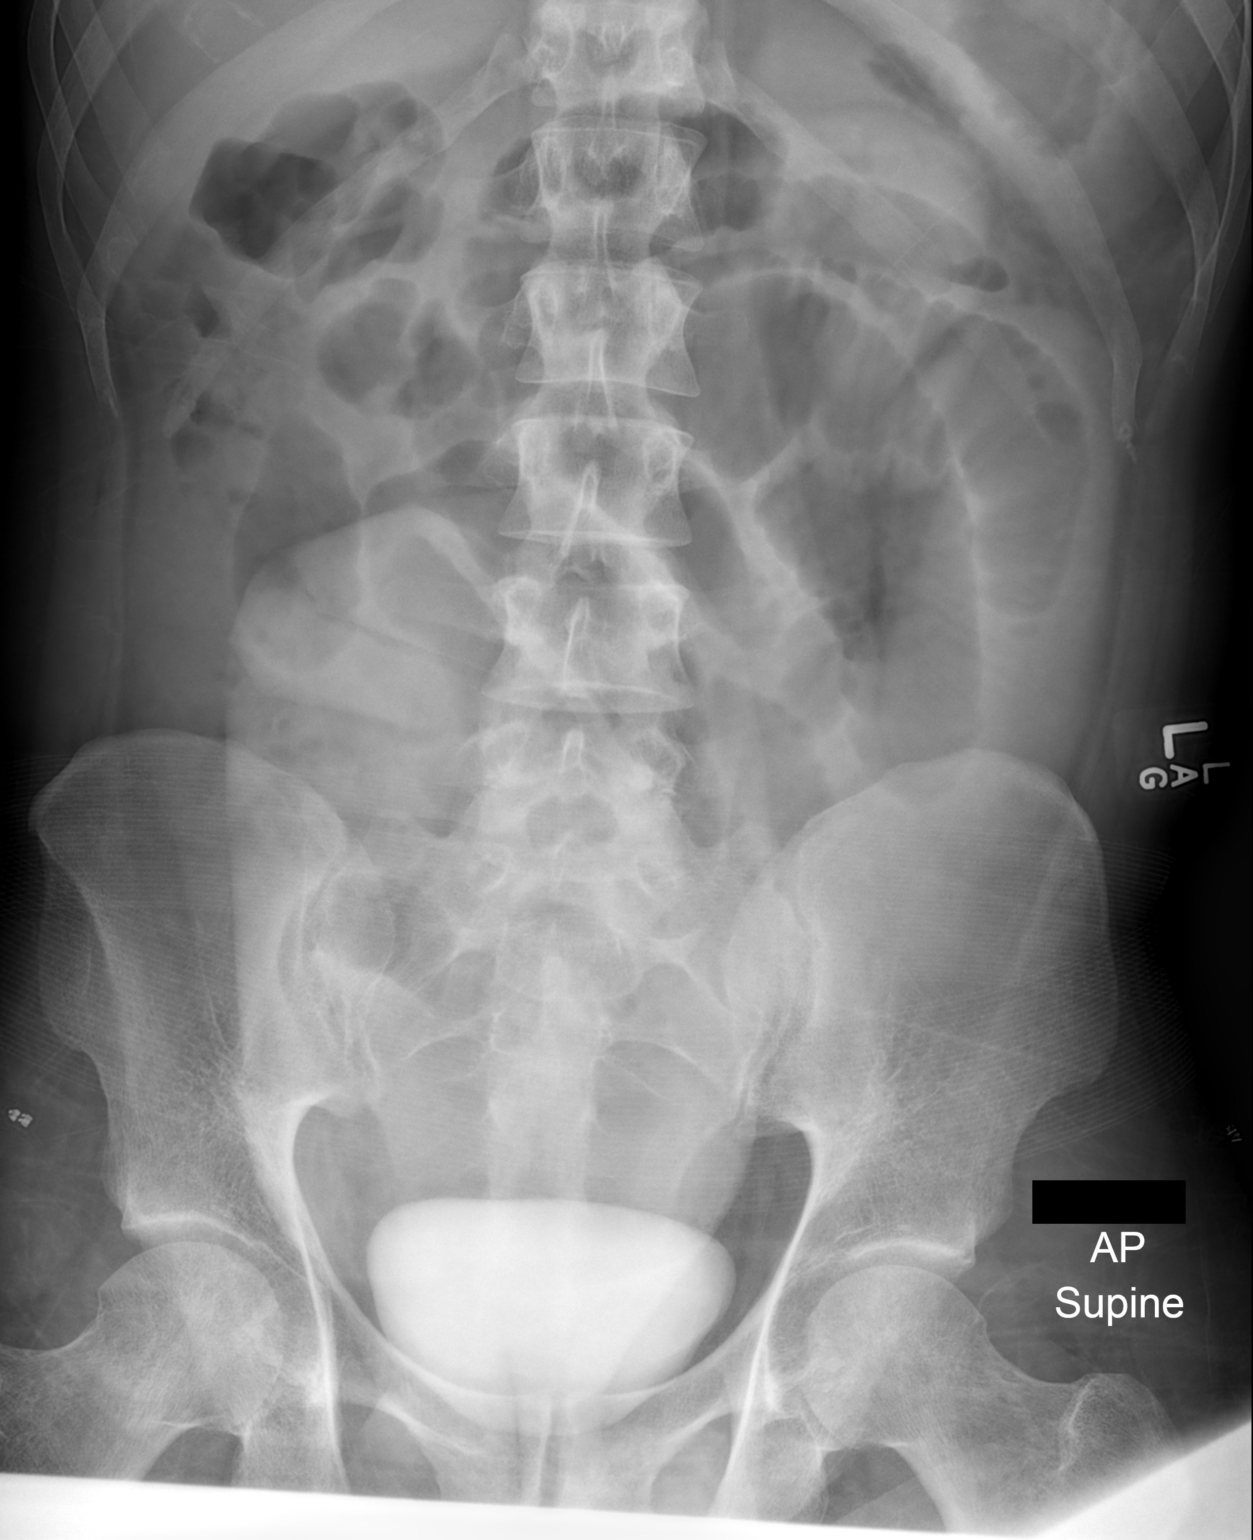
[im 2/2]
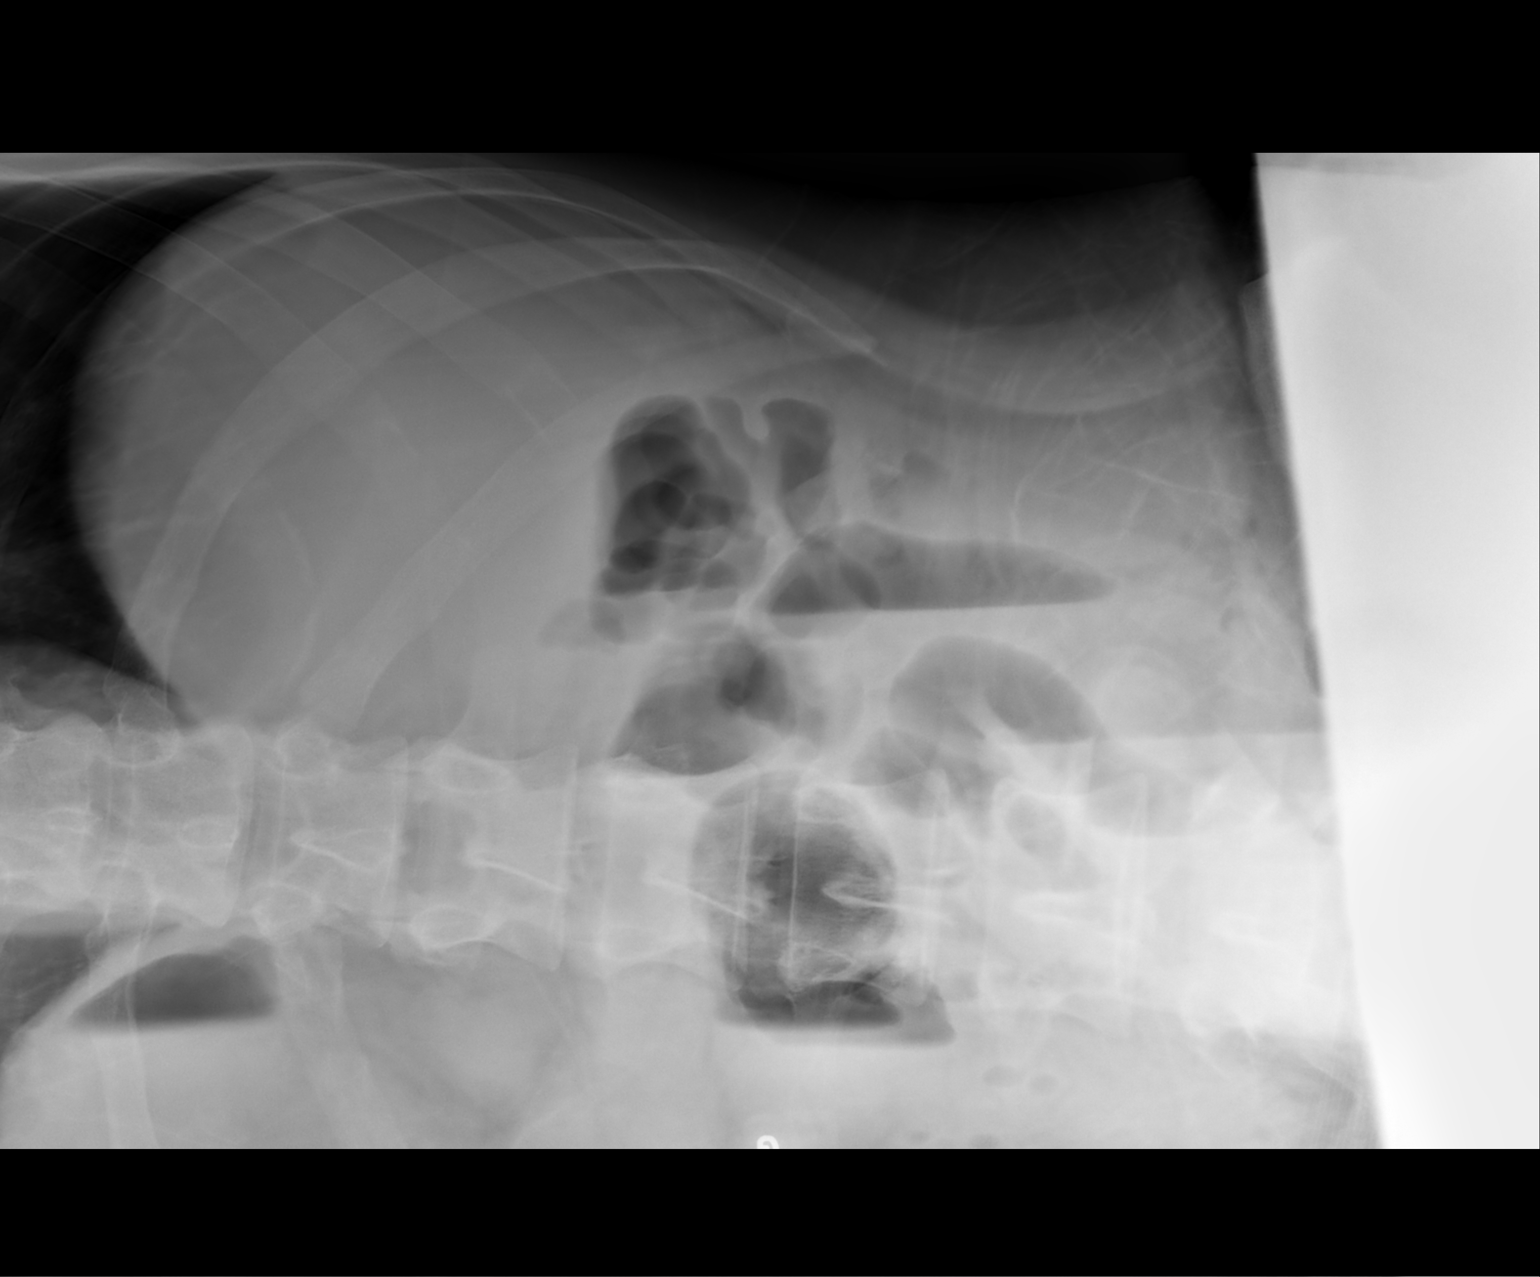

[2 of 2 positions shown; findings below may reference images not displayed]

FINDINGS: Contrast is noted in the ostomy bag and bladder from prior CT.
Persistent dilated loops small bowel are noted consistent with small
bowel obstruction. No pneumatosis. No free air noted. No acute bony
abnormality .
IMPRESSION: 1. Oral contrast is noted the ostomy bag from prior CT.
2. Persistent prominent dilated loops of small bowel consistent with
persistent small-bowel obstruction. No free air.

## 2015-11-19 ENCOUNTER — Telehealth: Payer: Self-pay

## 2015-11-19 ENCOUNTER — Telehealth: Payer: Self-pay | Admitting: Oncology

## 2015-11-19 NOTE — Telephone Encounter (Signed)
Pt called stating he has head congestion and fever. He will take pseudophed and tylenol and the congestion and fever will dissipate. It will then come back. The mucus gets to his stomach and makes him nauseated and diarrhea. He has not had chemo since 10/03/14 and sees Dr Benay Spice in June. He tried his PCP but he is out of office for the week and no one else can see him. Directed him to urgent care d/t prevalent flu this season.

## 2015-11-19 NOTE — Telephone Encounter (Signed)
per pt to r/s appt-gave pt r/s time & date

## 2015-11-20 NOTE — Telephone Encounter (Signed)
Call placed to check on patient.  He states that he did not go to urgent care yesterday and is feeling "much better" today.  Pt states that he was able to eat with no nausea or vomiting today.  Pt appreciative of call.

## 2015-11-22 ENCOUNTER — Inpatient Hospital Stay (HOSPITAL_COMMUNITY): Payer: 59

## 2015-11-22 ENCOUNTER — Encounter: Payer: Self-pay | Admitting: Nurse Practitioner

## 2015-11-22 ENCOUNTER — Observation Stay (HOSPITAL_COMMUNITY)
Admission: AD | Admit: 2015-11-22 | Discharge: 2015-11-26 | DRG: 919 | Disposition: A | Discharge status: Home / Self Care | Payer: 59 | Source: Ambulatory Visit | Attending: Internal Medicine | Admitting: Internal Medicine

## 2015-11-22 ENCOUNTER — Other Ambulatory Visit: Payer: Self-pay | Admitting: *Deleted

## 2015-11-22 ENCOUNTER — Ambulatory Visit: Payer: 59

## 2015-11-22 ENCOUNTER — Ambulatory Visit (HOSPITAL_BASED_OUTPATIENT_CLINIC_OR_DEPARTMENT_OTHER): Payer: 59 | Admitting: Nurse Practitioner

## 2015-11-22 ENCOUNTER — Encounter (HOSPITAL_COMMUNITY): Payer: Self-pay | Admitting: *Deleted

## 2015-11-22 ENCOUNTER — Other Ambulatory Visit (HOSPITAL_COMMUNITY)
Admission: RE | Admit: 2015-11-22 | Discharge: 2015-11-22 | Disposition: A | Discharge status: Home / Self Care | Payer: 59 | Source: Ambulatory Visit | Attending: Oncology | Admitting: Oncology

## 2015-11-22 ENCOUNTER — Other Ambulatory Visit (HOSPITAL_BASED_OUTPATIENT_CLINIC_OR_DEPARTMENT_OTHER): Payer: 59

## 2015-11-22 ENCOUNTER — Encounter: Payer: Self-pay | Admitting: *Deleted

## 2015-11-22 VITALS — BP 140/82 | HR 110 | Temp 98.6°F | Resp 18 | Ht 74.0 in | Wt 143.9 lb

## 2015-11-22 DIAGNOSIS — E8809 Other disorders of plasma-protein metabolism, not elsewhere classified: Secondary | ICD-10-CM

## 2015-11-22 DIAGNOSIS — Z9221 Personal history of antineoplastic chemotherapy: Secondary | ICD-10-CM | POA: Diagnosis not present

## 2015-11-22 DIAGNOSIS — R5383 Other fatigue: Secondary | ICD-10-CM | POA: Diagnosis not present

## 2015-11-22 DIAGNOSIS — C20 Malignant neoplasm of rectum: Secondary | ICD-10-CM | POA: Diagnosis present

## 2015-11-22 DIAGNOSIS — T8132XA Disruption of internal operation (surgical) wound, not elsewhere classified, initial encounter: Principal | ICD-10-CM | POA: Diagnosis present

## 2015-11-22 DIAGNOSIS — Z888 Allergy status to other drugs, medicaments and biological substances status: Secondary | ICD-10-CM | POA: Diagnosis not present

## 2015-11-22 DIAGNOSIS — Z79899 Other long term (current) drug therapy: Secondary | ICD-10-CM

## 2015-11-22 DIAGNOSIS — T81320A Disruption or dehiscence of gastrointestinal tract anastomosis, repair, or closure, initial encounter: Secondary | ICD-10-CM | POA: Diagnosis present

## 2015-11-22 DIAGNOSIS — R531 Weakness: Secondary | ICD-10-CM | POA: Insufficient documentation

## 2015-11-22 DIAGNOSIS — E43 Unspecified severe protein-calorie malnutrition: Secondary | ICD-10-CM | POA: Diagnosis not present

## 2015-11-22 DIAGNOSIS — R634 Abnormal weight loss: Secondary | ICD-10-CM

## 2015-11-22 DIAGNOSIS — R509 Fever, unspecified: Secondary | ICD-10-CM | POA: Diagnosis present

## 2015-11-22 DIAGNOSIS — Z803 Family history of malignant neoplasm of breast: Secondary | ICD-10-CM | POA: Diagnosis not present

## 2015-11-22 DIAGNOSIS — R197 Diarrhea, unspecified: Secondary | ICD-10-CM

## 2015-11-22 DIAGNOSIS — Z87891 Personal history of nicotine dependence: Secondary | ICD-10-CM

## 2015-11-22 DIAGNOSIS — Z881 Allergy status to other antibiotic agents status: Secondary | ICD-10-CM | POA: Diagnosis not present

## 2015-11-22 DIAGNOSIS — Z8672 Personal history of thrombophlebitis: Secondary | ICD-10-CM | POA: Diagnosis not present

## 2015-11-22 DIAGNOSIS — Y832 Surgical operation with anastomosis, bypass or graft as the cause of abnormal reaction of the patient, or of later complication, without mention of misadventure at the time of the procedure: Secondary | ICD-10-CM | POA: Diagnosis not present

## 2015-11-22 DIAGNOSIS — D630 Anemia in neoplastic disease: Secondary | ICD-10-CM

## 2015-11-22 DIAGNOSIS — R110 Nausea: Secondary | ICD-10-CM

## 2015-11-22 DIAGNOSIS — Z806 Family history of leukemia: Secondary | ICD-10-CM | POA: Diagnosis not present

## 2015-11-22 DIAGNOSIS — Z85048 Personal history of other malignant neoplasm of rectum, rectosigmoid junction, and anus: Secondary | ICD-10-CM

## 2015-11-22 DIAGNOSIS — E46 Unspecified protein-calorie malnutrition: Secondary | ICD-10-CM | POA: Diagnosis not present

## 2015-11-22 DIAGNOSIS — D509 Iron deficiency anemia, unspecified: Secondary | ICD-10-CM | POA: Diagnosis not present

## 2015-11-22 DIAGNOSIS — R6889 Other general symptoms and signs: Secondary | ICD-10-CM

## 2015-11-22 DIAGNOSIS — Z923 Personal history of irradiation: Secondary | ICD-10-CM | POA: Diagnosis not present

## 2015-11-22 DIAGNOSIS — Z8371 Family history of colonic polyps: Secondary | ICD-10-CM | POA: Diagnosis not present

## 2015-11-22 DIAGNOSIS — D638 Anemia in other chronic diseases classified elsewhere: Secondary | ICD-10-CM | POA: Diagnosis not present

## 2015-11-22 DIAGNOSIS — D62 Acute posthemorrhagic anemia: Secondary | ICD-10-CM | POA: Diagnosis not present

## 2015-11-22 DIAGNOSIS — T451X5A Adverse effect of antineoplastic and immunosuppressive drugs, initial encounter: Secondary | ICD-10-CM

## 2015-11-22 DIAGNOSIS — Z95828 Presence of other vascular implants and grafts: Secondary | ICD-10-CM

## 2015-11-22 DIAGNOSIS — G620 Drug-induced polyneuropathy: Secondary | ICD-10-CM

## 2015-11-22 LAB — FOLATE: FOLATE: 20.6 ng/mL (ref >5.9)

## 2015-11-22 LAB — COMPREHENSIVE METABOLIC PANEL
ALK PHOS: 96 U/L (ref 40–150)
ALT: 11 U/L (ref 0–55)
AST: 13 U/L (ref 5–34)
Albumin: 2.7 g/dL — ABNORMAL LOW (ref 3.5–5.0)
Anion Gap: 12 mEq/L — ABNORMAL HIGH (ref 3–11)
BUN: 8.4 mg/dL (ref 7.0–26.0)
CALCIUM: 9 mg/dL (ref 8.4–10.4)
CHLORIDE: 103 meq/L (ref 98–109)
CO2: 25 mEq/L (ref 22–29)
Creatinine: 0.8 mg/dL (ref 0.7–1.3)
Glucose: 90 mg/dl (ref 70–140)
POTASSIUM: 4 meq/L (ref 3.5–5.1)
SODIUM: 140 meq/L (ref 136–145)
Total Bilirubin: 0.33 mg/dL (ref 0.20–1.20)
Total Protein: 7.3 g/dL (ref 6.4–8.3)

## 2015-11-22 LAB — CBC WITH DIFFERENTIAL/PLATELET
BASO%: 0.1 % (ref 0.0–2.0)
BASOS ABS: 0 10*3/uL (ref 0.0–0.1)
EOS ABS: 0.1 10*3/uL (ref 0.0–0.5)
EOS%: 0.9 % (ref 0.0–7.0)
HEMATOCRIT: 30.7 % — AB (ref 38.4–49.9)
HGB: 9.7 g/dL — ABNORMAL LOW (ref 13.0–17.1)
LYMPH%: 23.9 % (ref 14.0–49.0)
MCH: 25.1 pg — AB (ref 27.2–33.4)
MCHC: 31.6 g/dL — AB (ref 32.0–36.0)
MCV: 79.5 fL (ref 79.3–98.0)
MONO#: 1 10*3/uL — ABNORMAL HIGH (ref 0.1–0.9)
MONO%: 12.7 % (ref 0.0–14.0)
NEUT#: 5 10*3/uL (ref 1.5–6.5)
NEUT%: 62.4 % (ref 39.0–75.0)
Platelets: 355 10*3/uL (ref 140–400)
RBC: 3.86 10*6/uL — ABNORMAL LOW (ref 4.20–5.82)
RDW: 16.4 % — ABNORMAL HIGH (ref 11.0–14.6)
WBC: 7.9 10*3/uL (ref 4.0–10.3)
lymph#: 1.9 10*3/uL (ref 0.9–3.3)

## 2015-11-22 LAB — TYPE AND SCREEN
ABO/RH(D): B POS
ANTIBODY SCREEN: NEGATIVE

## 2015-11-22 LAB — CBC
HCT: 29.9 % — ABNORMAL LOW (ref 39.0–52.0)
HEMOGLOBIN: 9.5 g/dL — AB (ref 13.0–17.0)
MCH: 25.2 pg — ABNORMAL LOW (ref 26.0–34.0)
MCHC: 31.8 g/dL (ref 30.0–36.0)
MCV: 79.3 fL (ref 78.0–100.0)
Platelets: 358 10*3/uL (ref 150–400)
RBC: 3.77 MIL/uL — AB (ref 4.22–5.81)
RDW: 16.4 % — ABNORMAL HIGH (ref 11.5–15.5)
WBC: 7.7 10*3/uL (ref 4.0–10.5)

## 2015-11-22 LAB — C DIFFICILE QUICK SCREEN W PCR REFLEX
C DIFFICILE (CDIFF) TOXIN: NEGATIVE
C DIFFICLE (CDIFF) ANTIGEN: NEGATIVE
C Diff interpretation: NEGATIVE

## 2015-11-22 LAB — FERRITIN: Ferritin: 239 ng/mL (ref 24–336)

## 2015-11-22 LAB — URINALYSIS, MICROSCOPIC - CHCC
Bilirubin (Urine): NEGATIVE
Blood: NEGATIVE
Glucose: NEGATIVE mg/dL
KETONES: NEGATIVE mg/dL
LEUKOCYTE ESTERASE: NEGATIVE
NITRITE: NEGATIVE
PH: 6 (ref 4.6–8.0)
PROTEIN: NEGATIVE mg/dL
RBC / HPF: NEGATIVE (ref 0–2)
Specific Gravity, Urine: 1.015 (ref 1.003–1.035)
UROBILINOGEN UR: 0.2 mg/dL (ref 0.2–1)

## 2015-11-22 LAB — RETICULOCYTES
RBC.: 3.51 MIL/uL — AB (ref 4.22–5.81)
RETIC COUNT ABSOLUTE: 31.6 10*3/uL (ref 19.0–186.0)
Retic Ct Pct: 0.9 % (ref 0.4–3.1)

## 2015-11-22 LAB — IRON AND TIBC
Iron: 9 ug/dL — ABNORMAL LOW (ref 45–182)
Saturation Ratios: 5 % — ABNORMAL LOW (ref 17.9–39.5)
TIBC: 183 ug/dL — ABNORMAL LOW (ref 250–450)
UIBC: 174 ug/dL

## 2015-11-22 LAB — VITAMIN B12: VITAMIN B 12: 312 pg/mL (ref 180–914)

## 2015-11-22 LAB — CREATININE, SERUM: CREATININE: 0.89 mg/dL (ref 0.61–1.24)

## 2015-11-22 LAB — DRAW EXTRA CLOT TUBE

## 2015-11-22 MED ORDER — HEPARIN SODIUM (PORCINE) 5000 UNIT/ML IJ SOLN: 5000.0000 [IU] | Freq: Three times a day (TID) | INTRAMUSCULAR | Status: DC

## 2015-11-22 MED ORDER — SIMETHICONE 80 MG PO CHEW
80.0000 mg | CHEWABLE_TABLET | Freq: Once | ORAL | Status: AC
  Administered 2015-11-22: 80 mg via ORAL
  Filled 2015-11-22: qty 1

## 2015-11-22 MED ORDER — PROMETHAZINE HCL 25 MG PO TABS: 12.5000 mg | ORAL_TABLET | Freq: Four times a day (QID) | ORAL | Status: DC | PRN

## 2015-11-22 MED ORDER — LORAZEPAM 0.5 MG PO TABS
0.2500 mg | ORAL_TABLET | Freq: Four times a day (QID) | ORAL | Status: DC | PRN
  Administered 2015-11-22 – 2015-11-26 (×3): 0.25 mg via ORAL
  Filled 2015-11-22 (×3): qty 1

## 2015-11-22 MED ORDER — IOPAMIDOL (ISOVUE-300) INJECTION 61%
100.0000 mL | Freq: Once | INTRAVENOUS | Status: AC | PRN
  Administered 2015-11-22: 100 mL via INTRAVENOUS

## 2015-11-22 MED ORDER — OXYCODONE-ACETAMINOPHEN 5-325 MG PO TABS
1.0000 | ORAL_TABLET | Freq: Four times a day (QID) | ORAL | Status: DC | PRN
  Administered 2015-11-22: 2 via ORAL
  Filled 2015-11-22: qty 2

## 2015-11-22 MED ORDER — SODIUM CHLORIDE 0.9% FLUSH
10.0000 mL | INTRAVENOUS | Status: DC | PRN
Start: 2015-11-22 — End: 2015-11-22
  Administered 2015-11-22: 10 mL via INTRAVENOUS
  Filled 2015-11-22: qty 10

## 2015-11-22 MED ORDER — MORPHINE SULFATE (PF) 4 MG/ML IV SOLN
4.0000 mg | INTRAVENOUS | Status: DC | PRN
  Filled 2015-11-22: qty 1

## 2015-11-22 MED ORDER — OXYCODONE HCL 5 MG PO TABS: 5.0000 mg | ORAL_TABLET | ORAL | Status: DC | PRN

## 2015-11-22 MED ORDER — SODIUM CHLORIDE 0.9 % IV SOLN
INTRAVENOUS | Status: DC
  Administered 2015-11-22: 13:00:00 via INTRAVENOUS

## 2015-11-22 MED ORDER — SODIUM CHLORIDE 0.9 % IV SOLN
INTRAVENOUS | Status: AC
  Administered 2015-11-22 – 2015-11-23 (×3): via INTRAVENOUS

## 2015-11-22 MED ORDER — ONDANSETRON HCL 4 MG/2ML IJ SOLN
4.0000 mg | Freq: Four times a day (QID) | INTRAMUSCULAR | Status: DC | PRN
  Administered 2015-11-22: 4 mg via INTRAVENOUS
  Filled 2015-11-22: qty 2

## 2015-11-22 MED ORDER — LOPERAMIDE HCL 2 MG PO CAPS
2.0000 mg | ORAL_CAPSULE | Freq: Once | ORAL | Status: AC
  Administered 2015-11-22: 2 mg via ORAL
  Filled 2015-11-22: qty 1

## 2015-11-22 NOTE — Assessment & Plan Note (Signed)
Patient is complaining of decreased appetite, weight loss, increased fatigue/weakness for the past 4 months.  Patient will be admitted to the hospital for further evaluation and management today.

## 2015-11-22 NOTE — Assessment & Plan Note (Signed)
Patient states that he has lost a good deal of weight within the past 4 months.  He also complains of decreased appetite and generalized fatigue/weakness as well.  Patient will be admitted to the hospital for further evaluation and management.

## 2015-11-22 NOTE — Assessment & Plan Note (Signed)
Albumin is decreased down to 2.7 today.  Patient has lost a good deal of weight recently; has decreased appetite.  He was encouraged to push protein in his diet is much as possible.

## 2015-11-22 NOTE — Progress Notes (Signed)
Patient taken to room 1335 via wheelchair with family. Selena Lesser, NP, gave report to Maudie Mercury, Therapist, sports.

## 2015-11-22 NOTE — Assessment & Plan Note (Signed)
Patient is status post Xeloda oral therapy and radiation treatment in the past.  Patient completed his last cycle of FOLFOX chemotherapy in February 2016.  He has been undergoing observation only since that time.  Patient states that he has had some URI/bronchitis symptoms; and was given a round of antibiotics within the last few months.  Patient had a colostomy reversal per Dr. [name], general surgeon in December 2016.  He states that he did experience some complications with the healing of the reversal site; with a diagnosed abscess at the site.  The abscess site was opened and drained; and the patient was given antibiotics per Dr. Marcello Moores.  Also, patient states that a primary care provider felt that he may very well have the flu; and the patient completed a Tamiflu pack as well.  Patient continues to feel very poorly; he presents with symptoms mentioned throughout this note.  Due to patient's symptoms-patient will be direct admitted to the hospital for further evaluation and management.  Brief history and report were given to Dr. Aileen Fass hospitalist prior to the patient being transported to the floor as a direct admit.  Note: Patient is considered a full code; since there are no advanced directives in the chart.

## 2015-11-22 NOTE — Progress Notes (Signed)
Pt.s mother called to inquire if pt can be seen today by Dr. Benay Spice.  Pt has had fever and diarrhea for 2 weeks.  Last night's fever was 102.6.  Pt is taking Tylenol and Imodium with no relief of fever or diarrhea.  Pt is able to eat some, but has diarrhea immediately after eating.  Per Dr. Benay Spice, pt to be seen by C. Berniece Salines NP.  Call placed back to pt.'s mother and she states that they can be here in 30 minutes.

## 2015-11-22 NOTE — Assessment & Plan Note (Signed)
Patient states that he has been having intermittent fevers for the past 1-2 weeks.  He says his maximum fever was 102.6 this week.  Patient is afebrile at this present time.  Given patient's history-of influenza symptoms, URI symptoms, colostomy reversal site abscess, and multiple antibiotics-differential diagnosis for fever could include C. difficile.  Was able to obtain a stool sample to rule out C. difficile and is RE been taken to the lab.  Patient also reports some occasional dysuria as well; and a urine sample/culture was obtained and sent to the lab.  Blood cultures 2-with 1 peripheral stick and one from his port were also obtained and pending results.  Also of note-patient states that he has been chronically leaking some stool.  Rectal exam today revealed a possible fissure or abscess to the anterior rectal wall.  Patient was leaking a small amount of stool that Hemoccult positive.  Dr. Benay Spice spoke to Dr. Marcello Moores' partner Dr. Catalina Antigua (surgeon); and he recommended that patient be admitted for further evaluation as well.  Recommend that patient have all cultures followed; and obtain a CT with contrast of the abdomen and pelvis.  Brief history and report were called to the hospitalist; prior to the patient being transported to the room via wheelchair.  Per the cancer Center nurse.

## 2015-11-22 NOTE — Patient Instructions (Signed)

## 2015-11-22 NOTE — Assessment & Plan Note (Signed)
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  He states that the neuropathy to his hands have greatly improved.

## 2015-11-22 NOTE — Assessment & Plan Note (Signed)
Pt has had chronic diarrhea for several weeks. He is also leaking stool regularly. Stool was Heme + on exam today. Concern for c. Diff.  Stool for c. Diff sent to lab.

## 2015-11-22 NOTE — Consult Note (Signed)
General Surgery [facility]  Reason for Consult: fever, diarrhea  Referring Physician: Dr. Julieanne Manson, medical oncology  Seth Villanueva is an 35 y.o. male.  HPI: patient is a 35 yo WM managed by Dr. [name] for rectal carcinoma.  Complicated course with LAR for rectal carcinoma, stenosis, eventual takedown of ileostomy.  Rx with XRT.  Infusion port in place.  Now with frequent diarrhea, fever.  Seen by medical oncology and admitted to medical service for work up.  Recent wound care at [place] office for ileostomy site infection, now healed.  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  Past Medical History  Diagnosis Date  . S/P radiation therapy 02/19/14-03/28/14    rectal  50.4Gy  . History of cancer chemotherapy     completed 09-03-2014  . Anastomotic stricture of colorectal region [facility])   . Chemotherapy-induced neuropathy ([place])   . S/P ileostomy ([place])   . Wears glasses   . History of phlebitis     08-20-2014---- RIGHT UPPER INNER ARM--  RESOLVED   . Incomplete right bundle branch block   . Rectal cancer [facility]) onocologist-  dr Benay Spice    dx 01-31-2015--  Stage III (ypT2, ypN2) invasive--  s/p  radiation (ended 03-28-2014) and chemo (complete 09-03-2014) and low anterior colon resection with ileostomy 05-18-2014  . Numbness     feet bilat     Past Surgical History  Procedure Laterality Date  . Wisdom tooth extraction  age 63  . Eus N/A 01/30/2014    Procedure: LOWER ENDOSCOPIC ULTRASOUND (EUS);  Surgeon: Arta Silence, MD;  Location: [facility] ENDOSCOPY;  Service: Endoscopy;  Laterality: N/A;  with biospy's  . Colonoscopy w/ biopsies  01/22/2014    fungating nonobstructing large mass distal rectum  . Portacath placement Left 05/31/2014    Procedure: INSERTION PORT-A-CATH LET SUBCLAVIAN;  Surgeon: [name], MD;  Location: [facility] ORS;  Service: General;  Laterality: Left;  . Robot assisted low anterior colon resection with flexure mobilization /  hand sewn colo-anal  anastomiosis/  diverting loop ileostomy  05-18-2014  . Rectal exam under anesthesia N/A 11/15/2014    Procedure: ANAL EXAM UNDER ANESTHESIA, RIGID PROTOSCOPY;  Surgeon: [name], MD;  Location: [place];  Service: General;  Laterality: N/A;  . Flexible sigmoidoscopy N/A 11/15/2014    Procedure: FLEXIBLE SIGMOIDOSCOPY;  Surgeon: [name], MD;  Location: [facility];  Service: General;  Laterality: N/A;  . Repair of rectal prolapse N/A 04/18/2015    Procedure: RETROGRADE COLONOSCOPY AND DILATION OF ANAL STRICTURE;  Surgeon: [name], MD;  Location: [facility];  Service: General;  Laterality: N/A;  . Ileostomy closure N/A 07/12/2015    Procedure: ILEOSTOMY REVERSAL;  Surgeon: [name], MD;  Location: [facility];  Service: General;  Laterality: N/A;  . Laparoscopy N/A 07/12/2015    Procedure: LAPAROSCOPY DIAGNOSTIC;  Surgeon: [name], MD;  Location: [facility];  Service: General;  Laterality: N/A;    Family History  Problem Relation Age of Onset  . Breast cancer Maternal Aunt     pat mat great aunt; currently 65  . Leukemia Maternal Aunt     pat mat great aunt; deceased 29s  . Other Paternal Aunt     colon polyps (type/#?)    Social History:  reports that he quit smoking about 8 years ago. His smoking use included Cigarettes. He has a 1.25 pack-year smoking history. He quit smokeless tobacco use about 9 years ago. His smokeless tobacco use included  Snuff and Chew. He reports that he does not drink alcohol or use illicit drugs.  Allergies:  Allergies  Allergen Reactions  . Zithromax [Azithromycin] Hives  . Compazine [Prochlorperazine] Anxiety    Medications: I have reviewed the patient's current medications.  Results for orders placed or performed during the hospital encounter of 11/22/15 (from the past 48 hour(s))  CBC     Status: Abnormal   Collection Time: 11/22/15  3:28 PM  Result Value Ref Range   WBC 7.7 4.0 - 10.5 K/uL   RBC 3.77 (L)  4.22 - 5.81 MIL/uL   Hemoglobin 9.5 (L) 13.0 - 17.0 g/dL   HCT 29.9 (L) 39.0 - 52.0 %   MCV 79.3 78.0 - 100.0 fL   MCH 25.2 (L) 26.0 - 34.0 pg   MCHC 31.8 30.0 - 36.0 g/dL   RDW 16.4 (H) 11.5 - 15.5 %   Platelets 358 150 - 400 K/uL  Creatinine, serum     Status: None   Collection Time: 11/22/15  3:28 PM  Result Value Ref Range   Creatinine, Ser 0.89 0.61 - 1.24 mg/dL   GFR calc non Af Amer >60 >60 mL/min   GFR calc Af Amer >60 >60 mL/min    Comment: (NOTE) The eGFR has been calculated using the CKD EPI equation. This calculation has not been validated in all clinical situations. eGFR's persistently <60 mL/min signify possible Chronic Kidney Disease.   Reticulocytes     Status: Abnormal   Collection Time: 11/22/15  3:57 PM  Result Value Ref Range   Retic Ct Pct 0.9 0.4 - 3.1 %   RBC. 3.51 (L) 4.22 - 5.81 MIL/uL   Retic Count, Manual 31.6 19.0 - 186.0 K/uL    No results found.  Review of Systems  Constitutional: Positive for fever and chills.  HENT: Negative.   Eyes: Negative.   Respiratory: Negative.   Cardiovascular: Negative.   Gastrointestinal: Positive for diarrhea. Negative for nausea, vomiting and abdominal pain.  Genitourinary: Negative.   Musculoskeletal: Negative.   Skin: Negative.   Neurological: Positive for weakness.  Endo/Heme/Allergies: Negative.   Psychiatric/Behavioral: Negative.    Blood pressure 116/69, pulse 104, temperature 99.8 F (37.7 C), temperature source Oral, resp. rate 20, height _0  (1.88 m), weight 65.046 kg (143 lb 6.4 oz), SpO2 100 %. Physical Exam  Constitutional: He is oriented to person, place, and time. He appears well-developed. No distress.  HENT:  Head: Normocephalic and atraumatic.  Right Ear: External ear normal.  Left Ear: External ear normal.  Eyes: Conjunctivae are normal. Pupils are equal, round, and reactive to light. No scleral icterus.  Neck: Normal range of motion. Neck supple. No tracheal deviation present. No  thyromegaly present.  Cardiovascular: Normal rate, regular rhythm and normal heart sounds.   No murmur heard. Respiratory: Effort normal and breath sounds normal. No respiratory distress. He has no wheezes.  GI: Soft. Bowel sounds are normal. He exhibits no distension and no mass. There is no tenderness. There is no rebound and no guarding.  Wound right mid abd wall healed with small dry eschar  Musculoskeletal: Normal range of motion. He exhibits no edema or tenderness.  Neurological: He is alert and oriented to person, place, and time.  Skin: Skin is warm and dry.  Psychiatric: He has a normal mood and affect. His behavior is normal.    Assessment/Plan: Diarrhea, fever of undetermined origin  CT scan pending this evening  Empiric abx per medical servicesf  Surgery will follow  with you - Dr. Marcello Moores back on Monday 4/17.  Earnstine Regal, MD, [facility] Office: [place] 11/22/2015, 4:35 PM

## 2015-11-22 NOTE — H&P (Addendum)
[facility] History and Physical  Seth Villanueva [id number] DOB: 02-Jul-1981 DOA: 11/22/2015  Referring physician: Dr. Ammie Villanueva PCP: Seth Sake, MD   Chief Complaint: weakness  HPI: Seth Villanueva is a 35 y.o. male diagnosis rectal adenocarcinoma on 01/26/2014 status post radiation and and robotic-assisted ultra low anterior resection and a diverting loop ileostomy on 05/18/2014 chemotherapy, who then developed colorectal perforation which was repair, ileostomy reversal done on 07/12/2015, who then developed some complications and an abscess at the site of the reversal the abscess site was opened and drained and was treated with empiric antibiotic, he relates he's been having fevers at home, and episodes of watery diarrhea with severe and painful spasms, he still has an appetite has no nausea or vomiting.   Review of Systems:  Constitutional:  No weight loss, night sweats, Fevers, chills, fatigue.  HEENT:  No headaches, Difficulty swallowing,Tooth/dental problems,Sore throat,  No sneezing, itching, ear ache, nasal congestion, post nasal drip,  Cardio-vascular:  No chest pain, Orthopnea, PND, swelling in lower extremities, anasarca, dizziness, palpitations  GI:  No heartburn, indigestion, abdominal pain, nausea, vomiting,  loss of appetite  Resp:  No shortness of breath with exertion or at rest. No excess mucus, no productive cough, No non-productive cough, No coughing up of blood.No change in color of mucus.No wheezing.No chest wall deformity  Skin:  no rash or lesions.  GU:  no dysuria, change in color of urine, no urgency or frequency. No flank pain.  Musculoskeletal:  No joint pain or swelling. No decreased range of motion. No back pain.  Psych:  No change in mood or affect. No depression or anxiety. No memory loss.   Past Medical History  Diagnosis Date  . S/P radiation therapy 02/19/14-03/28/14    rectal  50.4Gy  . History of cancer chemotherapy     completed  09-03-2014  . Anastomotic stricture of colorectal region [facility])   . Chemotherapy-induced neuropathy ([place])   . S/P ileostomy ([place])   . Wears glasses   . History of phlebitis     08-20-2014---- RIGHT UPPER INNER ARM--  RESOLVED   . Incomplete right bundle branch block   . Rectal cancer [facility]) onocologist-  dr Seth Villanueva    dx 01-31-2015--  Stage III (ypT2, ypN2) invasive--  s/p  radiation (ended 03-28-2014) and chemo (complete 09-03-2014) and low anterior colon resection with ileostomy 05-18-2014  . Numbness     feet bilat    Past Surgical History  Procedure Laterality Date  . Wisdom tooth extraction  age 43  . Eus N/A 01/30/2014    Procedure: LOWER ENDOSCOPIC ULTRASOUND (EUS);  Surgeon: Seth Silence, MD;  Location: [facility] ENDOSCOPY;  Service: Endoscopy;  Laterality: N/A;  with biospy's  . Colonoscopy w/ biopsies  01/22/2014    fungating nonobstructing large mass distal rectum  . Portacath placement Left 05/31/2014    Procedure: INSERTION PORT-A-CATH LET SUBCLAVIAN;  Surgeon: Seth Ruff, MD;  [facility];  Service: General;  Laterality: Left;  . Robot assisted low anterior colon resection with flexure mobilization /  hand sewn colo-anal anastomiosis/  diverting loop ileostomy  05-18-2014  . Rectal exam under anesthesia N/A 11/15/2014    Procedure: ANAL EXAM UNDER ANESTHESIA, RIGID PROTOSCOPY;  Surgeon: Seth Ruff, MD;  Location: [place];  Service: General;  Laterality: N/A;  . Flexible sigmoidoscopy N/A 11/15/2014    Procedure: FLEXIBLE SIGMOIDOSCOPY;  Surgeon: Seth Ruff, MD;  Location: [place];  Service: General;  Laterality: N/A;  . Repair  of rectal prolapse N/A 04/18/2015    Procedure: RETROGRADE COLONOSCOPY AND DILATION OF ANAL STRICTURE;  Surgeon: Seth Ruff, MD;  [facility];  Service: General;  Laterality: N/A;  . Ileostomy closure N/A 07/12/2015    Procedure: ILEOSTOMY REVERSAL;  Surgeon: Seth Ruff, MD;  [facility];  Service:  General;  Laterality: N/A;  . Laparoscopy N/A 07/12/2015    Procedure: LAPAROSCOPY DIAGNOSTIC;  Surgeon: Seth Ruff, MD;  Location[facility];  Service: General;  Laterality: N/A;   Social History:  reports that he quit smoking about 8 years ago. His smoking use included Cigarettes. He has a 1.25 pack-year smoking history. He quit smokeless tobacco use about 9 years ago. His smokeless tobacco use included Snuff and Chew. He reports that he does not drink alcohol or use illicit drugs.  Allergies  Allergen Reactions  . Zithromax [Azithromycin] Hives  . Compazine [Prochlorperazine] Anxiety    Family History  Problem Relation Age of Onset  . Breast cancer Maternal Aunt     pat mat great aunt; currently 52  . Leukemia Maternal Aunt     pat mat great aunt; deceased 60s  . Other Paternal Aunt     colon polyps (type/#?)    Prior to Admission medications   Medication Sig Start Date End Date Taking? Authorizing Provider  acetaminophen (TYLENOL) 325 MG tablet Take 325 mg by mouth once as needed for mild pain (pain). Reported on 07/29/2015    Historical Provider, MD  NON FORMULARY Take 2 tablets by mouth 2 (two) times daily. Reported on 11/22/2015    Historical Provider, MD  oxyCODONE-acetaminophen (PERCOCET/ROXICET) 5-325 MG tablet Take 1-2 tablets by mouth every 6 (six) hours as needed for severe pain. Patient not taking: Reported on [id number]   Seth Ruff, MD  pseudoephedrine (SUDAFED) 60 MG tablet Take 60 mg by mouth every 4 (four) hours as needed for congestion. Reported on 11/22/2015    Historical Provider, MD   Physical Exam: There were no vitals filed for this visit.  Wt Readings from Last 3 Encounters:  11/22/15 65.273 kg (143 lb 14.4 oz)  07/29/15 71.759 kg (158 lb 3.2 oz)  07/12/15 74.844 kg (165 lb)    General:  Appears calm and comfortable Eyes: PERRL, normal lids, irises & conjunctiva ENT: grossly normal hearing, lips & tongue Neck: no LAD, masses or  thyromegaly Cardiovascular: RRR, no m/r/g. No LE edema. Telemetry: SR, no arrhythmias  Respiratory: CTA bilaterally, no w/r/r. Normal respiratory effort. Abdomen: soft, nondistended nontender has a scar which is well-healed. Skin: no rash or induration seen on limited exam Musculoskeletal: grossly normal tone BUE/BLE Psychiatric: grossly normal mood and affect, speech fluent and appropriate Neurologic: grossly non-focal.          Labs on Admission:  Basic Metabolic Panel:  Recent Labs Lab 11/22/15 1057  NA 140  K 4.0  CO2 25  GLUCOSE 90  BUN 8.4  CREATININE 0.8  CALCIUM 9.0   Liver Function Tests:  Recent Labs Lab 11/22/15 1057  AST 13  ALT 11  ALKPHOS 96  BILITOT 0.33  PROT 7.3  ALBUMIN 2.7*   No results for input(s): LIPASE, AMYLASE in the last 168 hours. No results for input(s): AMMONIA in the last 168 hours. CBC:  Recent Labs Lab 11/22/15 1057  WBC 7.9  NEUTROABS 5.0  HGB 9.7*  HCT 30.7*  MCV 79.5  PLT 355   Cardiac Enzymes: No results for input(s): CKTOTAL, CKMB, CKMBINDEX, TROPONINI in the last 168 hours.  BNP (last 3 results) No results for input(s): BNP in the last 8760 hours.  ProBNP (last 3 results) No results for input(s): PROBNP in the last 8760 hours.  CBG: No results for input(s): GLUCAP in the last 168 hours.  Radiological Exams on Admission: No results found.  EKG: Independently reviewed. none  Assessment/Plan Principal Problem:   Fever Active Problems:   Diarrhea   Hypoalbuminemia due to protein-calorie malnutrition (HCC)   Iron deficiency anemia   Acute blood loss anemia  I will go ahead and get blood cultures, he is stable so will not start empiric antibiotics, he's had a history of chemotherapy and radiation,on several courses of antibiotics, which puts him at risk of c. Dif, will check a C. difficile, will also get a CT scan of the abdomen and pelvis to rule out an perineal abscess. Possible anorectal fistula is in the  differential. We'll check an anemia panel, as he is having chronic blood loss anemia by evident by his CBC, he has a borderline low MCV with another elevated RDW, and will type and screen and 2 units of packed blood cells.  Code Status: Full DVT Prophylaxis:SCD Family Communication: mother Disposition Plan: inpatient  Time spent: 3 min  Charlynne Cousins [facility] Pager [phone]

## 2015-11-22 NOTE — Assessment & Plan Note (Signed)
Patient states that he has been having intermittent fevers for the past 1-2 weeks.  He says his maximum fever was 102.6 this week.  Patient is afebrile at this present time.  Given patient's history-of influenza symptoms, URI symptoms, colostomy reversal site abscess, and multiple antibiotics-differential diagnosis for fever could include C. difficile.  Was able to obtain a stool sample to rule out C. difficile and is RE been taken to the lab.  Patient also reports some occasional dysuria as well; and a urine sample/culture was obtained and sent to the lab.  Blood cultures 2-with 1 peripheral stick and one from his port were also obtained and pending results.  Also of note-patient states that he has been chronically leaking some stool.  Rectal exam today revealed a possible fissure or abscess to the anterior rectal wall.  Patient was leaking a small amount of stool that Hemoccult positive.  Hemoglobin has decreased from 14.2 down to 9.7.  Patient appears fatigued./Weak and very pale.  He denies any shortness of breath with exertion.  Dr. Benay Spice spoke to Dr. Marcello Moores' partner Dr. Catalina Antigua (surgeon); and he recommended that patient be admitted for further evaluation as well.  Recommend that patient have all cultures followed; and obtain a CT with contrast of the abdomen and pelvis.  Brief history and report were called to the hospitalist; prior to the patient being transported to the room via wheelchair.  Per the cancer Center nurse.

## 2015-11-22 NOTE — Progress Notes (Signed)
SYMPTOM MANAGEMENT CLINIC    Chief Complaint: Weakness, diarrhea, fever  HPI:  Seth Villanueva 35 y.o. male diagnosed with rectal cancer.  Patient is status post Xeloda, radiation treatments, and FOLFOX chemotherapy.  Currently undergoing observation only.   Patient states that he has been having intermittent fevers for the past 1-2 weeks.  He says his maximum fever was 102.6 this week.  Patient is afebrile at this present time.  Given patient's history-of influenza symptoms, URI symptoms, colostomy reversal site abscess, and multiple antibiotics-differential diagnosis for fever could include C. difficile.  Was able to obtain a stool sample to rule out C. difficile and is RE been taken to the lab.  Patient also reports some occasional dysuria as well; and a urine sample/culture was obtained and sent to the lab.  Blood cultures 2-with 1 peripheral stick and one from his port were also obtained and pending results.  Also of note-patient states that he has been chronically leaking some stool.  Rectal exam today revealed a possible fissure or abscess to the anterior rectal wall.  Patient was leaking a small amount of stool that Hemoccult positive.  Hemoglobin has decreased from 14.2 down to 9.7.  Patient appears fatigued./Weak and very pale.  He denies any shortness of breath with exertion.  Dr. Benay Spice spoke to Dr. Marcello Moores' partner Dr. Catalina Antigua (surgeon); and he recommended that patient be admitted for further evaluation as well.  Recommend that patient have all cultures followed; and obtain a CT with contrast of the abdomen and pelvis.  Brief history and report were called to the hospitalist; prior to the patient being transported to the room via wheelchair.  Per the cancer Center nurse.   No history exists.    Review of Systems  Constitutional: Positive for weight loss and malaise/fatigue. Negative for fever and chills.  Gastrointestinal: Positive for nausea, abdominal pain, diarrhea  and blood in stool. Negative for vomiting and constipation.  Genitourinary: Positive for dysuria.  Neurological: Positive for weakness.  All other systems reviewed and are negative.   Past Medical History  Diagnosis Date  . S/P radiation therapy 02/19/14-03/28/14    rectal  50.4Gy  . History of cancer chemotherapy     completed 09-03-2014  . Anastomotic stricture of colorectal region [facility])   . Chemotherapy-induced neuropathy ([place])   . S/P ileostomy ([place])   . Wears glasses   . History of phlebitis     08-20-2014---- RIGHT UPPER INNER ARM--  RESOLVED   . Incomplete right bundle branch block   . Rectal cancer [facility]) onocologist-  dr Benay Spice    dx 01-31-2015--  Stage III (ypT2, ypN2) invasive--  s/p  radiation (ended 03-28-2014) and chemo (complete 09-03-2014) and low anterior colon resection with ileostomy 05-18-2014  . Numbness     feet bilat     Past Surgical History  Procedure Laterality Date  . Wisdom tooth extraction  age 77  . Eus N/A 01/30/2014    Procedure: LOWER ENDOSCOPIC ULTRASOUND (EUS);  Surgeon: Arta Silence, MD;  Location: [facility] ENDOSCOPY;  Service: Endoscopy;  Laterality: N/A;  with biospy's  . Colonoscopy w/ biopsies  01/22/2014    fungating nonobstructing large mass distal rectum  . Portacath placement Left 05/31/2014    Procedure: INSERTION PORT-A-CATH LET SUBCLAVIAN;  Surgeon: [name], MD;  Location: [facility] ORS;  Service: General;  Laterality: Left;  . Robot assisted low anterior colon resection with flexure mobilization /  hand sewn colo-anal anastomiosis/  diverting loop ileostomy  05-18-2014  .  Rectal exam under anesthesia N/A 11/15/2014    Procedure: ANAL EXAM UNDER ANESTHESIA, RIGID PROTOSCOPY;  Surgeon: [name], MD;  Location: [place];  Service: General;  Laterality: N/A;  . Flexible sigmoidoscopy N/A 11/15/2014    Procedure: FLEXIBLE SIGMOIDOSCOPY;  Surgeon: [name], MD;  Location: [facility];  Service: General;   Laterality: N/A;  . Repair of rectal prolapse N/A 04/18/2015    Procedure: RETROGRADE COLONOSCOPY AND DILATION OF ANAL STRICTURE;  Surgeon: [name], MD;  Location: [facility] ORS;  Service: General;  Laterality: N/A;  . Ileostomy closure N/A 07/12/2015    Procedure: ILEOSTOMY REVERSAL;  Surgeon: [name], MD;  Location: [facility] ORS;  Service: General;  Laterality: N/A;  . Laparoscopy N/A 07/12/2015    Procedure: LAPAROSCOPY DIAGNOSTIC;  Surgeon: [name], MD;  Location: [facility] ORS;  Service: General;  Laterality: N/A;    has Rectal cancer ([place]); Anastomotic stricture of colorectal region [facility]); Anal stricture; Small bowel obstruction ([place]); Genetic testing; Chemotherapy-induced neuropathy ([place]); Fever; Diarrhea; Unintentional weight change; Weakness; Nausea without vomiting; Hypoalbuminemia due to protein-calorie malnutrition ([place]); and Anemia in neoplastic disease on his problem list.    is allergic to zithromax and compazine.    Medication List       This list is accurate as of: 11/22/15  2:12 PM.  Always use your most recent med list.               acetaminophen 325 MG tablet  Commonly known as:  TYLENOL  Take 325 mg by mouth once as needed for mild pain (pain). Reported on 07/29/2015     NON FORMULARY  Take 2 tablets by mouth 2 (two) times daily. Reported on 11/22/2015     oxyCODONE-acetaminophen 5-325 MG tablet  Commonly known as:  PERCOCET/ROXICET  Take 1-2 tablets by mouth every 6 (six) hours as needed for severe pain.     pseudoephedrine 60 MG tablet  Commonly known as:  SUDAFED  Take 60 mg by mouth every 4 (four) hours as needed for congestion. Reported on 11/22/2015         PHYSICAL EXAMINATION  Oncology Vitals 11/22/2015 09/13/2015  Height 188 cm -  Weight 65.273 kg -  Weight (lbs) 143 lbs 14 oz -  BMI (kg/m2) 18.48 kg/m2 -  Temp 98.6 97.5  Pulse 110 85  Resp 18 18  SpO2 99 -  BSA (m2) 1.85 m2 -   BP Readings from Last 2 Encounters:  11/22/15 140/82  09/13/15  124/72    Physical Exam  Constitutional: He is oriented to person, place, and time. He appears malnourished and dehydrated. He appears unhealthy. He appears cachectic. He has a sickly appearance.  HENT:  Head: Normocephalic and atraumatic.  Mouth/Throat: Oropharynx is clear and moist. No oropharyngeal exudate.  Eyes: Conjunctivae and EOM are normal. Pupils are equal, round, and reactive to light. Right eye exhibits no discharge. Left eye exhibits no discharge. No scleral icterus.  Neck: Normal range of motion. Neck supple. No JVD present. No tracheal deviation present. No thyromegaly present.  Cardiovascular: Regular rhythm, normal heart sounds and intact distal pulses.   Tachycardic at 110.  Pulmonary/Chest: Effort normal and breath sounds normal. No respiratory distress. He has no wheezes. He has no rales. He exhibits no tenderness.  Abdominal: Soft. Bowel sounds are normal. He exhibits no distension and no mass. There is tenderness. There is no rebound and no guarding.  Patient has trace tenderness to the right lower abdominal area with  palpation only.  Genitourinary:  Perirectal exam revealed a questionable fissure to the rectum itself; with some leaking stool which was Hemoccult positive.  Musculoskeletal: Normal range of motion. He exhibits no edema or tenderness.  Lymphadenopathy:    He has no cervical adenopathy.  Neurological: He is alert and oriented to person, place, and time. Gait normal.  Skin: Skin is warm and dry. No rash noted. No erythema. There is pallor.  Patient's right lower abdominal area.  Colostomy takedown site with healing scab.  There is some trace tenderness with palpation around the takedown site.  Psychiatric: Affect and judgment normal.  Nursing note and vitals reviewed.   LABORATORY DATA:. Appointment on 11/22/2015  Component Date Value Ref Range Status  . WBC 11/22/2015 7.9  4.0 - 10.3 10e3/uL Final  . NEUT# 11/22/2015 5.0  1.5 - 6.5 10e3/uL Final  .  HGB 11/22/2015 9.7* 13.0 - 17.1 g/dL Final  . HCT 11/22/2015 30.7* 38.4 - 49.9 % Final  . Platelets 11/22/2015 355  140 - 400 10e3/uL Final  . MCV 11/22/2015 79.5  79.3 - 98.0 fL Final  . MCH 11/22/2015 25.1* 27.2 - 33.4 pg Final  . MCHC 11/22/2015 31.6* 32.0 - 36.0 g/dL Final  . RBC 11/22/2015 3.86* 4.20 - 5.82 10e6/uL Final  . RDW 11/22/2015 16.4* 11.0 - 14.6 % Final  . lymph# 11/22/2015 1.9  0.9 - 3.3 10e3/uL Final  . MONO# 11/22/2015 1.0* 0.1 - 0.9 10e3/uL Final  . Eosinophils Absolute 11/22/2015 0.1  0.0 - 0.5 10e3/uL Final  . Basophils Absolute 11/22/2015 0.0  0.0 - 0.1 10e3/uL Final  . NEUT% 11/22/2015 62.4  39.0 - 75.0 % Final  . LYMPH% 11/22/2015 23.9  14.0 - 49.0 % Final  . MONO% 11/22/2015 12.7  0.0 - 14.0 % Final  . EOS% 11/22/2015 0.9  0.0 - 7.0 % Final  . BASO% 11/22/2015 0.1  0.0 - 2.0 % Final  . Sodium 11/22/2015 140  136 - 145 mEq/L Final  . Potassium 11/22/2015 4.0  3.5 - 5.1 mEq/L Final  . Chloride 11/22/2015 103  98 - 109 mEq/L Final  . CO2 11/22/2015 25  22 - 29 mEq/L Final  . Glucose 11/22/2015 90  70 - 140 mg/dl Final   Glucose reference range is for nonfasting patients. Fasting glucose reference range is 70- 100.  Marland Kitchen BUN 11/22/2015 8.4  7.0 - 26.0 mg/dL Final  . Creatinine 11/22/2015 0.8  0.7 - 1.3 mg/dL Final  . Total Bilirubin 11/22/2015 0.33  0.20 - 1.20 mg/dL Final  . Alkaline Phosphatase 11/22/2015 96  40 - 150 U/L Final  . AST 11/22/2015 13  5 - 34 U/L Final  . ALT 11/22/2015 11  0 - 55 U/L Final  . Total Protein 11/22/2015 7.3  6.4 - 8.3 g/dL Final  . Albumin 11/22/2015 2.7* 3.5 - 5.0 g/dL Final  . Calcium 11/22/2015 9.0  8.4 - 10.4 mg/dL Final  . Anion Gap 11/22/2015 12* 3 - 11 mEq/L Final  . EGFR 11/22/2015 >90  >90 ml/min/1.73 m2 Final   eGFR is calculated using the CKD-EPI Creatinine Equation (2009)  . Extra Tube 11/22/2015 Sample held in lab for 7 days   Final    RADIOGRAPHIC STUDIES: No results found.  ASSESSMENT/PLAN:    Rectal cancer  [facility]) Patient is status post Xeloda oral therapy and radiation treatment in the past.  Patient completed his last cycle of FOLFOX chemotherapy in February 2016.  He has been undergoing observation only since that  time.  Patient states that he has had some URI/bronchitis symptoms; and was given a round of antibiotics within the last few months.  Patient had a colostomy reversal per Dr. [name], general surgeon in December 2016.  He states that he did experience some complications with the healing of the reversal site; with a diagnosed abscess at the site.  The abscess site was opened and drained; and the patient was given antibiotics per Dr. Marcello Moores.  Also, patient states that a primary care provider felt that he may very well have the flu; and the patient completed a Tamiflu pack as well.  Patient continues to feel very poorly; he presents with symptoms mentioned throughout this note.  Due to patient's symptoms-patient will be direct admitted to the hospital for further evaluation and management.  Brief history and report were given to Dr. Aileen Fass hospitalist prior to the patient being transported to the floor as a direct admit.  Note: Patient is considered a full code; since there are no advanced directives in the chart.    Chemotherapy-induced neuropathy ([place]) Pt continues to have some chemo-induced neuropathy to his feet.  He states that the neuropathy to his hands have greatly improved.  Fever Patient states that he has been having intermittent fevers for the past 1-2 weeks.  He says his maximum fever was 102.6 this week.  Patient is afebrile at this present time.  Given patient's history-of influenza symptoms, URI symptoms, colostomy reversal site abscess, and multiple antibiotics-differential diagnosis for fever could include C. difficile.  Was able to obtain a stool sample to rule out C. difficile and is RE been taken to the lab.  Patient also reports some occasional dysuria as  well; and a urine sample/culture was obtained and sent to the lab.  Blood cultures 2-with 1 peripheral stick and one from his port were also obtained and pending results.  Also of note-patient states that he has been chronically leaking some stool.  Rectal exam today revealed a possible fissure or abscess to the anterior rectal wall.  Patient was leaking a small amount of stool that Hemoccult positive.  Dr. Benay Spice spoke to Dr. Marcello Moores' partner Dr. Catalina Antigua (surgeon); and he recommended that patient be admitted for further evaluation as well.  Recommend that patient have all cultures followed; and obtain a CT with contrast of the abdomen and pelvis.  Brief history and report were called to the hospitalist; prior to the patient being transported to the room via wheelchair.  Per the cancer Center nurse.  Diarrhea Pt has had chronic diarrhea for several weeks. He is also leaking stool regularly. Stool was Heme + on exam today. Concern for c. Diff.  Stool for c. Diff sent to lab.   Unintentional weight change Patient states that he has lost a good deal of weight within the past 4 months.  He also complains of decreased appetite and generalized fatigue/weakness as well.  Patient will be admitted to the hospital for further evaluation and management.  Weakness Patient is complaining of decreased appetite, weight loss, increased fatigue/weakness for the past 4 months.  Patient will be admitted to the hospital for further evaluation and management today.  Nausea without vomiting Patient is complaining of chronic nausea; but no vomiting.  Patient does have nausea medication on to take on an as-needed basis.  Hypoalbuminemia due to protein-calorie malnutrition (HCC) Albumin is decreased down to 2.7 today.  Patient has lost a good deal of weight recently; has decreased appetite.  He was encouraged  to push protein in his diet is much as possible.  Anemia in neoplastic disease Patient states that he  has been having intermittent fevers for the past 1-2 weeks.  He says his maximum fever was 102.6 this week.  Patient is afebrile at this present time.  Given patient's history-of influenza symptoms, URI symptoms, colostomy reversal site abscess, and multiple antibiotics-differential diagnosis for fever could include C. difficile.  Was able to obtain a stool sample to rule out C. difficile and is RE been taken to the lab.  Patient also reports some occasional dysuria as well; and a urine sample/culture was obtained and sent to the lab.  Blood cultures 2-with 1 peripheral stick and one from his port were also obtained and pending results.  Also of note-patient states that he has been chronically leaking some stool.  Rectal exam today revealed a possible fissure or abscess to the anterior rectal wall.  Patient was leaking a small amount of stool that Hemoccult positive.  Hemoglobin has decreased from 14.2 down to 9.7.  Patient appears fatigued./Weak and very pale.  He denies any shortness of breath with exertion.  Dr. Benay Spice spoke to Dr. Marcello Moores' partner Dr. Catalina Antigua (surgeon); and he recommended that patient be admitted for further evaluation as well.  Recommend that patient have all cultures followed; and obtain a CT with contrast of the abdomen and pelvis.  Brief history and report were called to the hospitalist; prior to the patient being transported to the room via wheelchair.  Per the cancer Center nurse.  Patient stated understanding of all instructions; and was in agreement with this plan of care. The patient knows to call the clinic with any problems, questions or concerns.   Total time spent with patient was 40 minutes;  with greater than 75 percent of that time spent in face to face counseling regarding patient's symptoms,  and coordination of care and follow up.  Disclaimer:This dictation was prepared with Dragon/digital dictation along with [employer]. Any transcriptional  errors that result from this process are unintentional.  Drue Second, NP 11/22/2015   This was a shared visit with Drue Second. Seth Villanueva was interviewed and examined. He reports a history of intermittent fever and failure to thrive for the past several months. He reports completing several courses of antibiotics. He has lost a significant amount of weight. He appears ill. He is anemic with a Hemoccult positive stool.  I doubt the fever is related to the Port-A-Cath. I am concerned he has an abdominal/pelvic infection or less likely recurrent rectal cancer. I discussed the case with Dr. Harlow Asa. He will be admitted to the internal medicine service for further evaluation to include CTs of the abdomen and pelvis. Cultures and a stool C. difficile sample were sent from the office  Oncology is available as needed over the weekend. I will check on him 11/25/2015.  Julieanne Manson, M.D.

## 2015-11-22 NOTE — Assessment & Plan Note (Signed)
Patient is complaining of chronic nausea; but no vomiting.  Patient does have nausea medication on to take on an as-needed basis.

## 2015-11-23 DIAGNOSIS — K929 Disease of digestive system, unspecified: Secondary | ICD-10-CM

## 2015-11-23 DIAGNOSIS — R197 Diarrhea, unspecified: Secondary | ICD-10-CM | POA: Diagnosis not present

## 2015-11-23 DIAGNOSIS — R509 Fever, unspecified: Secondary | ICD-10-CM

## 2015-11-23 DIAGNOSIS — T8132XA Disruption of internal operation (surgical) wound, not elsewhere classified, initial encounter: Secondary | ICD-10-CM | POA: Diagnosis not present

## 2015-11-23 DIAGNOSIS — T81320A Disruption or dehiscence of gastrointestinal tract anastomosis, repair, or closure, initial encounter: Secondary | ICD-10-CM | POA: Diagnosis present

## 2015-11-23 DIAGNOSIS — D62 Acute posthemorrhagic anemia: Secondary | ICD-10-CM

## 2015-11-23 DIAGNOSIS — E46 Unspecified protein-calorie malnutrition: Secondary | ICD-10-CM

## 2015-11-23 LAB — URINE CULTURE: Organism ID, Bacteria: NO GROWTH

## 2015-11-23 LAB — BASIC METABOLIC PANEL
ANION GAP: 9 (ref 5–15)
BUN: 11 mg/dL (ref 6–20)
CALCIUM: 7.9 mg/dL — AB (ref 8.9–10.3)
CO2: 28 mmol/L (ref 22–32)
Chloride: 107 mmol/L (ref 101–111)
Creatinine, Ser: 0.8 mg/dL (ref 0.61–1.24)
GFR calc Af Amer: >60 mL/min (ref >60)
GFR calc non Af Amer: >60 mL/min (ref >60)
GLUCOSE: 109 mg/dL — AB (ref 65–99)
POTASSIUM: 4.3 mmol/L (ref 3.5–5.1)
Sodium: 144 mmol/L (ref 135–145)

## 2015-11-23 LAB — CBC
HEMATOCRIT: 25.1 % — AB (ref 39.0–52.0)
Hemoglobin: 7.9 g/dL — ABNORMAL LOW (ref 13.0–17.0)
MCH: 25.4 pg — AB (ref 26.0–34.0)
MCHC: 31.5 g/dL (ref 30.0–36.0)
MCV: 80.7 fL (ref 78.0–100.0)
Platelets: 308 10*3/uL (ref 150–400)
RBC: 3.11 MIL/uL — ABNORMAL LOW (ref 4.22–5.81)
RDW: 16.7 % — AB (ref 11.5–15.5)
WBC: 5.5 10*3/uL (ref 4.0–10.5)

## 2015-11-23 MED ORDER — ENSURE ENLIVE PO LIQD
237.0000 mL | Freq: Three times a day (TID) | ORAL | Status: DC
  Administered 2015-11-23 – 2015-11-24 (×2): 237 mL via ORAL

## 2015-11-23 MED ORDER — PIPERACILLIN-TAZOBACTAM 3.375 G IVPB 30 MIN
3.3750 g | Freq: Once | INTRAVENOUS | Status: AC
  Administered 2015-11-23: 3.375 g via INTRAVENOUS
  Filled 2015-11-23 (×2): qty 50

## 2015-11-23 MED ORDER — PIPERACILLIN-TAZOBACTAM 3.375 G IVPB
3.3750 g | Freq: Three times a day (TID) | INTRAVENOUS | Status: DC
  Administered 2015-11-23 – 2015-11-25 (×6): 3.375 g via INTRAVENOUS
  Filled 2015-11-23 (×6): qty 50

## 2015-11-23 MED ORDER — MORPHINE SULFATE (PF) 2 MG/ML IV SOLN: 2.0000 mg | INTRAVENOUS | Status: DC | PRN

## 2015-11-23 MED ORDER — OXYCODONE-ACETAMINOPHEN 5-325 MG PO TABS
1.0000 | ORAL_TABLET | Freq: Four times a day (QID) | ORAL | Status: DC | PRN
Start: 2015-11-23 — End: 2015-11-26
  Administered 2015-11-23 – 2015-11-26 (×8): 2 via ORAL
  Filled 2015-11-23 (×8): qty 2

## 2015-11-23 MED ORDER — SIMETHICONE 80 MG PO CHEW
80.0000 mg | CHEWABLE_TABLET | Freq: Four times a day (QID) | ORAL | Status: DC | PRN
  Administered 2015-11-23 – 2015-11-26 (×4): 80 mg via ORAL
  Filled 2015-11-23 (×4): qty 1

## 2015-11-23 NOTE — Progress Notes (Signed)
[facility] PROGRESS NOTE   Seth Villanueva [id number] DOB: December 06, 1980 DOA: 11/22/2015 PCP: Leonides Sake, MD  HPI/Subjective: Patient seen with mother at bedside, the CT scan findings discussed with him. There is air around the rectal fat pad indicating anastomotic dehiscence.  Assessment/Plan: Principal Problem:   Fever Active Problems:   Diarrhea   Hypoalbuminemia due to protein-calorie malnutrition (HCC)   Iron deficiency anemia   Acute blood loss anemia   Dehiscence of intestinal anastomosis    Rectal anastomosis dehiscence Reported fever at home, presented with fever of 99.8 in the hospital. CT scan showed rectal anastomotic dehiscence with free air and the rectal fat pad. Patient is not septic, does not even have leukocytosis. We'll place on clear liquids, started on Zosyn.  Diarrhea Stool negative for C. difficile, given a dose of Imodium, discontinue antidiarrheals.  Severe protein energy malnutrition Apparent with hypoalbuminemia, albumin is 2.7. Dietitian to evaluate, started on ensure, improve nutritional status to help with healing.  Anemia Iron studies showed anemia of chronic disease with low iron. Follow closely and transfuse if hemoglobin less than 7.5.   Code Status: Full Code Family Communication: Plan discussed with the patient. Disposition Plan: Remains inpatient Diet: Diet clear liquid Room service appropriate?: Yes; Fluid consistency:: Thin  Consultants:  Gen. surgery  Procedures:  None  Antibiotics:  Zosyn (indicate start date, and stop date if known)   Objective: Filed Vitals:   11/23/15 0225 11/23/15 0657  BP: 99/58 109/54  Pulse: 83 84  Temp: 98.3 F (36.8 C) 98.5 F (36.9 C)  Resp: 18 18    Intake/Output Summary (Last 24 hours) at 11/23/15 1152 Last data filed at 11/23/15 0900  Gross per 24 hour  Intake 2236.25 ml  Output      0 ml  Net 2236.25 ml   Filed Weights   11/22/15 1537  Weight: 65.046 kg  (143 lb 6.4 oz)    Exam: General: Alert and awake, oriented x3, not in any acute distress. HEENT: anicteric sclera, pupils reactive to light and accommodation, EOMI CVS: S1-S2 clear, no murmur rubs or gallops Chest: clear to auscultation bilaterally, no wheezing, rales or rhonchi Abdomen: soft nontender, nondistended, normal bowel sounds, no organomegaly Extremities: no cyanosis, clubbing or edema noted bilaterally Neuro: Cranial nerves II-XII intact, no focal neurological deficits  Data Reviewed: Basic Metabolic Panel:  Recent Labs Lab 11/22/15 1057 11/22/15 1528 11/23/15 0530  NA 140  --  144  K 4.0  --  4.3  CL  --   --  107  CO2 25  --  28  GLUCOSE 90  --  109*  BUN 8.4  --  11  CREATININE 0.8 0.89 0.80  CALCIUM 9.0  --  7.9*   Liver Function Tests:  Recent Labs Lab 11/22/15 1057  AST 13  ALT 11  ALKPHOS 96  BILITOT 0.33  PROT 7.3  ALBUMIN 2.7*   No results for input(s): LIPASE, AMYLASE in the last 168 hours. No results for input(s): AMMONIA in the last 168 hours. CBC:  Recent Labs Lab 11/22/15 1057 11/22/15 1528 11/23/15 0530  WBC 7.9 7.7 5.5  NEUTROABS 5.0  --   --   HGB 9.7* 9.5* 7.9*  HCT 30.7* 29.9* 25.1*  MCV 79.5 79.3 80.7  PLT 355 358 308   Cardiac Enzymes: No results for input(s): CKTOTAL, CKMB, CKMBINDEX, TROPONINI in the last 168 hours. BNP (last 3 results) No results for input(s): BNP in the last 8760 hours.  ProBNP (last 3  results) No results for input(s): PROBNP in the last 8760 hours.  CBG: No results for input(s): GLUCAP in the last 168 hours.  Micro Recent Results (from the past 240 hour(s))  C difficile quick scan w PCR reflex     Status: None   Collection Time: 11/22/15  2:15 PM  Result Value Ref Range Status   C Diff antigen NEGATIVE NEGATIVE Final   C Diff toxin NEGATIVE NEGATIVE Final   C Diff interpretation Negative for toxigenic C. difficile  Final     Studies: X-ray Chest Pa And Lateral  11/22/2015  CLINICAL  DATA:  Fever. Rectal bleeding. History of rectal cancer. Ex-smoker. EXAM: CHEST  2 VIEW COMPARISON:  04/22/2015. FINDINGS: Normal sized heart. Clear lungs. Stable left subclavian porta catheter. Normal appearing bones. IMPRESSION: No acute abnormality. Electronically Signed   By: Claudie Revering M.D.   On: 11/22/2015 19:10   Ct Abdomen Pelvis W Contrast  11/22/2015  CLINICAL DATA:  Rectal carcinoma, low anterior resection, diarrhea and rectal pain. EXAM: CT ABDOMEN AND PELVIS WITH CONTRAST TECHNIQUE: Multidetector CT imaging of the abdomen and pelvis was performed using the standard protocol following bolus administration of intravenous contrast. CONTRAST:  180mL ISOVUE-300 IOPAMIDOL (ISOVUE-300) INJECTION 61% COMPARISON:  04/22/2015. FINDINGS: Lower chest: Lung bases show no acute findings. Heart size normal. No pericardial or pleural effusion. Hepatobiliary: Liver and gallbladder are unremarkable. No biliary ductal dilatation. Pancreas: Negative. Spleen: Negative. Adrenals/Urinary Tract: Adrenal glands and kidneys are unremarkable. Ureters are decompressed. Bladder is grossly unremarkable. Stomach/Bowel: Stomach and majority of the small bowel are unremarkable. There is an ileocecal anastomosis. Majority of the colon is unremarkable. Increased presacral soft tissue thickening, some of which is high in attenuation, with multiple locules of extraluminal air adjacent to the rectal anastomosis (series 2, images 65-84). Vascular/Lymphatic: Vascular structures are unremarkable. Retroperitoneal lymph nodes are not enlarged by CT size criteria. Reproductive: Prostate is normal in size. Other: Presacral soft tissue thickening and extraluminal air are discussed above. Mesenteries and peritoneum are otherwise unremarkable. No free fluid. Musculoskeletal: No worrisome lytic or sclerotic lesions. IMPRESSION: Heterogeneous presacral soft tissue thickening with extensive extraluminal air, highly suggestive of dehiscence at the  rectal anastomosis. Electronically Signed   By: Lorin Picket M.D.   On: 11/22/2015 20:26    Scheduled Meds: . piperacillin-tazobactam (ZOSYN)  IV  3.375 g Intravenous 3 times per day   Continuous Infusions: . sodium chloride 125 mL/hr at 11/23/15 0603       Time spent: 35 minutes    [facility] A  [facility] Pager [phone] If 7PM-7AM, please contact night-coverage at [email or web address], password [facility] 11/23/2015, 11:52 AM  LOS: 1 day

## 2015-11-23 NOTE — Progress Notes (Signed)
Subjective: Pt feels well   Reviewed CT with patient and his mother  Appearance of disruption of his rectum/anastimosis  He looks and feels well Minimal rectal discomfort   Objective: Vital signs in last 24 hours: Temp:  [98.3 F (36.8 C)-99.8 F (37.7 C)] 98.5 F (36.9 C) (04/15 0657) Pulse Rate:  [83-110] 84 (04/15 0657) Resp:  [18-20] 18 (04/15 0657) BP: (99-140)/(54-82) 109/54 mmHg (04/15 0657) SpO2:  [98 %-100 %] 98 % (04/15 0657) Weight:  [65.046 kg (143 lb 6.4 oz)-65.273 kg (143 lb 14.4 oz)] 65.046 kg (143 lb 6.4 oz) (04/14 1537) Last BM Date: 11/22/15  Intake/Output from previous day: 04/14 0701 - 04/15 0700 In: 1996.3 [P.O.:240; I.V.:1756.3] Out: -  Intake/Output this shift:    GI: soft NT wounds healed  anal verge with signs of inflammation  anus appears strictured with evidence of fissure did not place digit into rectum   Lab Results:   Recent Labs  11/22/15 1528 11/23/15 0530  WBC 7.7 5.5  HGB 9.5* 7.9*  HCT 29.9* 25.1*  PLT 358 308   BMET  Recent Labs  11/22/15 1057 11/22/15 1528 11/23/15 0530  NA 140  --  144  K 4.0  --  4.3  CL  --   --  107  CO2 25  --  28  GLUCOSE 90  --  109*  BUN 8.4  --  11  CREATININE 0.8 0.89 0.80  CALCIUM 9.0  --  7.9*   PT/INR No results for input(s): LABPROT, INR in the last 72 hours. ABG No results for input(s): PHART, HCO3 in the last 72 hours.  Invalid input(s): PCO2, PO2  Studies/Results: X-ray Chest Pa And Lateral  11/22/2015  CLINICAL DATA:  Fever. Rectal bleeding. History of rectal cancer. Ex-smoker. EXAM: CHEST  2 VIEW COMPARISON:  04/22/2015. FINDINGS: Normal sized heart. Clear lungs. Stable left subclavian porta catheter. Normal appearing bones. IMPRESSION: No acute abnormality. Electronically Signed   By: Claudie Revering M.D.   On: 11/22/2015 19:10   Ct Abdomen Pelvis W Contrast  11/22/2015  CLINICAL DATA:  Rectal carcinoma, low anterior resection, diarrhea and rectal pain. EXAM: CT ABDOMEN AND  PELVIS WITH CONTRAST TECHNIQUE: Multidetector CT imaging of the abdomen and pelvis was performed using the standard protocol following bolus administration of intravenous contrast. CONTRAST:  165mL ISOVUE-300 IOPAMIDOL (ISOVUE-300) INJECTION 61% COMPARISON:  04/22/2015. FINDINGS: Lower chest: Lung bases show no acute findings. Heart size normal. No pericardial or pleural effusion. Hepatobiliary: Liver and gallbladder are unremarkable. No biliary ductal dilatation. Pancreas: Negative. Spleen: Negative. Adrenals/Urinary Tract: Adrenal glands and kidneys are unremarkable. Ureters are decompressed. Bladder is grossly unremarkable. Stomach/Bowel: Stomach and majority of the small bowel are unremarkable. There is an ileocecal anastomosis. Majority of the colon is unremarkable. Increased presacral soft tissue thickening, some of which is high in attenuation, with multiple locules of extraluminal air adjacent to the rectal anastomosis (series 2, images 65-84). Vascular/Lymphatic: Vascular structures are unremarkable. Retroperitoneal lymph nodes are not enlarged by CT size criteria. Reproductive: Prostate is normal in size. Other: Presacral soft tissue thickening and extraluminal air are discussed above. Mesenteries and peritoneum are otherwise unremarkable. No free fluid. Musculoskeletal: No worrisome lytic or sclerotic lesions. IMPRESSION: Heterogeneous presacral soft tissue thickening with extensive extraluminal air, highly suggestive of dehiscence at the rectal anastomosis. Electronically Signed   By: Lorin Picket M.D.   On: 11/22/2015 20:26    Anti-infectives: Anti-infectives    Start     Dose/Rate Route Frequency Ordered Stop  11/23/15 0845  piperacillin-tazobactam (ZOSYN) IVPB 3.375 g    Comments:  Pharmacy to check dosing   3.375 g 12.5 mL/hr over 240 Minutes Intravenous 3 times per day 11/23/15 0841        Assessment/Plan: Hx of rectal cancer s/p neoadjuvant chemotherapy and radiation therapy    With resection/ diversion and hx of stenosis with signs of anastomotic disruption without significant symptoms   Long discussion of options with patient and mother  He is not ill from this  Will start zosyn and allow diet for now   Will defer to Dr Marcello Moores for management/ diagnostics   He will need 48 hours of IV ABX at this point   Options of management may include contrast study/ anoscopy  May need diversion as well but acutely at this point.    LOS: 1 day    Cortez Steelman A. 11/23/2015

## 2015-11-24 DIAGNOSIS — T8132XA Disruption of internal operation (surgical) wound, not elsewhere classified, initial encounter: Secondary | ICD-10-CM | POA: Diagnosis not present

## 2015-11-24 DIAGNOSIS — K929 Disease of digestive system, unspecified: Secondary | ICD-10-CM | POA: Diagnosis not present

## 2015-11-24 DIAGNOSIS — D509 Iron deficiency anemia, unspecified: Secondary | ICD-10-CM

## 2015-11-24 DIAGNOSIS — R509 Fever, unspecified: Secondary | ICD-10-CM | POA: Diagnosis not present

## 2015-11-24 DIAGNOSIS — E43 Unspecified severe protein-calorie malnutrition: Secondary | ICD-10-CM | POA: Insufficient documentation

## 2015-11-24 DIAGNOSIS — D62 Acute posthemorrhagic anemia: Secondary | ICD-10-CM | POA: Diagnosis not present

## 2015-11-24 DIAGNOSIS — R197 Diarrhea, unspecified: Secondary | ICD-10-CM | POA: Diagnosis not present

## 2015-11-24 LAB — BASIC METABOLIC PANEL
ANION GAP: 10 (ref 5–15)
BUN: 6 mg/dL (ref 6–20)
CALCIUM: 8.5 mg/dL — AB (ref 8.9–10.3)
CHLORIDE: 104 mmol/L (ref 101–111)
CO2: 27 mmol/L (ref 22–32)
CREATININE: 0.85 mg/dL (ref 0.61–1.24)
Glucose, Bld: 97 mg/dL (ref 65–99)
POTASSIUM: 4 mmol/L (ref 3.5–5.1)
SODIUM: 141 mmol/L (ref 135–145)

## 2015-11-24 LAB — CBC
HEMATOCRIT: 29 % — AB (ref 39.0–52.0)
Hemoglobin: 9 g/dL — ABNORMAL LOW (ref 13.0–17.0)
MCH: 24.4 pg — ABNORMAL LOW (ref 26.0–34.0)
MCHC: 31 g/dL (ref 30.0–36.0)
MCV: 78.6 fL (ref 78.0–100.0)
PLATELETS: 444 10*3/uL — AB (ref 150–400)
RBC: 3.69 MIL/uL — ABNORMAL LOW (ref 4.22–5.81)
RDW: 16.2 % — AB (ref 11.5–15.5)
WBC: 6.9 10*3/uL (ref 4.0–10.5)

## 2015-11-24 MED ORDER — POLYETHYLENE GLYCOL 3350 17 G PO PACK
17.0000 g | PACK | Freq: Every day | ORAL | Status: DC | PRN
  Administered 2015-11-24 – 2015-11-26 (×3): 17 g via ORAL
  Filled 2015-11-24 (×3): qty 1

## 2015-11-24 MED ORDER — OXYCODONE HCL 5 MG PO TABS: 5.0000 mg | ORAL_TABLET | ORAL | Status: DC | PRN

## 2015-11-24 MED ORDER — ENSURE ENLIVE PO LIQD
237.0000 mL | Freq: Four times a day (QID) | ORAL | Status: DC
  Administered 2015-11-24 – 2015-11-26 (×7): 237 mL via ORAL

## 2015-11-24 MED ORDER — IBUPROFEN 200 MG PO TABS
400.0000 mg | ORAL_TABLET | Freq: Four times a day (QID) | ORAL | Status: DC | PRN
  Administered 2015-11-24 – 2015-11-25 (×4): 400 mg via ORAL
  Filled 2015-11-24 (×4): qty 2

## 2015-11-24 NOTE — Progress Notes (Signed)
[facility] PROGRESS NOTE   Seth Villanueva [id number] DOB: 1981-05-24 DOA: 11/22/2015 PCP: Leonides Sake, MD  HPI/Subjective: Patient seen with his mother at bedside. Denies any pain, continue clear liquid side and antibiotics.  Assessment/Plan: Principal Problem:   Fever Active Problems:   Diarrhea   Hypoalbuminemia due to protein-calorie malnutrition (HCC)   Iron deficiency anemia   Acute blood loss anemia   Dehiscence of intestinal anastomosis   Protein-calorie malnutrition, severe    Rectal anastomosis dehiscence Reported fever at home, presented with fever of 99.8 in the hospital. CT scan showed rectal anastomotic dehiscence with free air and the rectal fat pad. Patient is not septic, does not even have leukocytosis. We'll place on clear liquids, started on Zosyn.  Diarrhea Stool negative for C. difficile, given a dose of Imodium, discontinue antidiarrheals.  Severe protein energy malnutrition Apparent with hypoalbuminemia, albumin is 2.7. Dietitian to evaluate, started on ensure, improve nutritional status to help with healing.  Anemia Iron studies showed anemia of chronic disease with low iron. Follow closely and transfuse if hemoglobin less than 7.5.   Code Status: Full Code Family Communication: Plan discussed with the patient. Disposition Plan: Remains inpatient Diet: Diet clear liquid Room service appropriate?: Yes; Fluid consistency:: Thin  Consultants:  Gen. surgery  Procedures:  None  Antibiotics:  Zosyn (indicate start date, and stop date if known)   Objective: Filed Vitals:   11/23/15 2152 11/24/15 0526  BP: 122/70 122/62  Pulse: 81 86  Temp: 98.2 F (36.8 C) 98.1 F (36.7 C)  Resp: 16 16    Intake/Output Summary (Last 24 hours) at 11/24/15 1324 Last data filed at 11/24/15 0900  Gross per 24 hour  Intake 2459.17 ml  Output      0 ml  Net 2459.17 ml   Filed Weights   11/22/15 1537  Weight: 65.046 kg (143 lb  6.4 oz)    Exam: General: Alert and awake, oriented x3, not in any acute distress. HEENT: anicteric sclera, pupils reactive to light and accommodation, EOMI CVS: S1-S2 clear, no murmur rubs or gallops Chest: clear to auscultation bilaterally, no wheezing, rales or rhonchi Abdomen: soft nontender, nondistended, normal bowel sounds, no organomegaly Extremities: no cyanosis, clubbing or edema noted bilaterally Neuro: Cranial nerves II-XII intact, no focal neurological deficits  Data Reviewed: Basic Metabolic Panel:  Recent Labs Lab 11/22/15 1057 11/22/15 1528 11/23/15 0530 11/24/15 0500  NA 140  --  144 141  K 4.0  --  4.3 4.0  CL  --   --  107 104  CO2 25  --  28 27  GLUCOSE 90  --  109* 97  BUN 8.4  --  11 6  CREATININE 0.8 0.89 0.80 0.85  CALCIUM 9.0  --  7.9* 8.5*   Liver Function Tests:  Recent Labs Lab 11/22/15 1057  AST 13  ALT 11  ALKPHOS 96  BILITOT 0.33  PROT 7.3  ALBUMIN 2.7*   No results for input(s): LIPASE, AMYLASE in the last 168 hours. No results for input(s): AMMONIA in the last 168 hours. CBC:  Recent Labs Lab 11/22/15 1057 11/22/15 1528 11/23/15 0530 11/24/15 0500  WBC 7.9 7.7 5.5 6.9  NEUTROABS 5.0  --   --   --   HGB 9.7* 9.5* 7.9* 9.0*  HCT 30.7* 29.9* 25.1* 29.0*  MCV 79.5 79.3 80.7 78.6  PLT 355 358 308 444*   Cardiac Enzymes: No results for input(s): CKTOTAL, CKMB, CKMBINDEX, TROPONINI in the last 168 hours. BNP (  last 3 results) No results for input(s): BNP in the last 8760 hours.  ProBNP (last 3 results) No results for input(s): PROBNP in the last 8760 hours.  CBG: No results for input(s): GLUCAP in the last 168 hours.  Micro Recent Results (from the past 240 hour(s))  Culture, Blood     Status: None (Preliminary result)   Collection Time: 11/22/15 10:57 AM  Result Value Ref Range Status   BLOOD CULTURE, ROUTINE Preliminary report  Preliminary   RESULT 1 Comment  Preliminary    Comment: No growth detected at this  time.  Urine Culture     Status: None   Collection Time: 11/22/15 10:58 AM  Result Value Ref Range Status   Urine Culture, Routine Final report  Final   Urine Culture result 1 No growth  Final  Culture, Blood     Status: None (Preliminary result)   Collection Time: 11/22/15 11:01 AM  Result Value Ref Range Status   BLOOD CULTURE, ROUTINE Preliminary report  Preliminary   RESULT 1 Comment  Preliminary    Comment: No growth detected at this time.  C difficile quick scan w PCR reflex     Status: None   Collection Time: 11/22/15  2:15 PM  Result Value Ref Range Status   C Diff antigen NEGATIVE NEGATIVE Final   C Diff toxin NEGATIVE NEGATIVE Final   C Diff interpretation Negative for toxigenic C. difficile  Final     Studies: X-ray Chest Pa And Lateral  11/22/2015  CLINICAL DATA:  Fever. Rectal bleeding. History of rectal cancer. Ex-smoker. EXAM: CHEST  2 VIEW COMPARISON:  04/22/2015. FINDINGS: Normal sized heart. Clear lungs. Stable left subclavian porta catheter. Normal appearing bones. IMPRESSION: No acute abnormality. Electronically Signed   By: Claudie Revering M.D.   On: 11/22/2015 19:10   Ct Abdomen Pelvis W Contrast  11/22/2015  CLINICAL DATA:  Rectal carcinoma, low anterior resection, diarrhea and rectal pain. EXAM: CT ABDOMEN AND PELVIS WITH CONTRAST TECHNIQUE: Multidetector CT imaging of the abdomen and pelvis was performed using the standard protocol following bolus administration of intravenous contrast. CONTRAST:  173mL ISOVUE-300 IOPAMIDOL (ISOVUE-300) INJECTION 61% COMPARISON:  04/22/2015. FINDINGS: Lower chest: Lung bases show no acute findings. Heart size normal. No pericardial or pleural effusion. Hepatobiliary: Liver and gallbladder are unremarkable. No biliary ductal dilatation. Pancreas: Negative. Spleen: Negative. Adrenals/Urinary Tract: Adrenal glands and kidneys are unremarkable. Ureters are decompressed. Bladder is grossly unremarkable. Stomach/Bowel: Stomach and majority of  the small bowel are unremarkable. There is an ileocecal anastomosis. Majority of the colon is unremarkable. Increased presacral soft tissue thickening, some of which is high in attenuation, with multiple locules of extraluminal air adjacent to the rectal anastomosis (series 2, images 65-84). Vascular/Lymphatic: Vascular structures are unremarkable. Retroperitoneal lymph nodes are not enlarged by CT size criteria. Reproductive: Prostate is normal in size. Other: Presacral soft tissue thickening and extraluminal air are discussed above. Mesenteries and peritoneum are otherwise unremarkable. No free fluid. Musculoskeletal: No worrisome lytic or sclerotic lesions. IMPRESSION: Heterogeneous presacral soft tissue thickening with extensive extraluminal air, highly suggestive of dehiscence at the rectal anastomosis. Electronically Signed   By: Lorin Picket M.D.   On: 11/22/2015 20:26    Scheduled Meds: . feeding supplement (ENSURE ENLIVE)  237 mL Oral QID  . piperacillin-tazobactam (ZOSYN)  IV  3.375 g Intravenous 3 times per day   Continuous Infusions:       Time spent: 35 minutes    [facility] Pager [phone]  If 7PM-7AM, please contact night-coverage at [email or web address], password [facility] 11/24/2015, 1:24 PM  LOS: 2 days

## 2015-11-24 NOTE — Progress Notes (Signed)
Initial Nutrition Assessment  DOCUMENTATION CODES:   Severe malnutrition in context of chronic illness, Underweight  INTERVENTION:   Provide Ensure Enlive po QID, each supplement provides 350 kcal and 20 grams of protein Will discuss snack options once diet is advanced. Encouraged small frequent meals RD to continue to monitor  NUTRITION DIAGNOSIS:   Increased nutrient needs related to cancer and cancer related treatments as evidenced by estimated needs.  GOAL:   Patient will meet greater than or equal to 90% of their needs  MONITOR:   PO intake, Supplement acceptance, Diet advancement, Labs, Weight trends, Skin, I & O's  REASON FOR ASSESSMENT:   Consult Assessment of nutrition requirement/status  ASSESSMENT:   35 y.o. male diagnosis rectal adenocarcinoma on 01/26/2014 status post radiation and and robotic-assisted ultra low anterior resection and a diverting loop ileostomy on 05/18/2014 chemotherapy, who then developed colorectal perforation which was repair, ileostomy reversal done on 07/12/2015, who then developed some complications and an abscess at the site of the reversal the abscess site was opened and drained and was treated with empiric antibiotic, he relates he's been having fevers at home, and episodes of watery diarrhea with severe and painful spasms, he still has an appetite has no nausea or vomiting.  Patient in room with family at bedside. Pt reports good appetite but PTA having episodes of diarrhea. Pt has been followed by [facility], last seen 1/25. He has been consuming ENU supplement, 3-4 a day. Pt has been consuming Ensure supplements during this admission. RD to increase order to QID. Pt states he has already drank 2 and is getting ready to drink another. Pt on clear liquid diet (PO intake 50-90%), with broth, gatorade and soda at bedside. States he is trying to drink as much as he can. States his appetite tends to fluctuate at home, states it is normal  most of the time. Pt denies any swallowing or chewing issues. RD to follow-up upon diet advancement to order snacks for patient. Per weight history, pt has lost 22 lb since 07/12/15 (13% wt loss x 4.5 months, significant for time frame). Pt is underweight. Nutrition-Focused physical exam completed. Findings are severe fat depletion, moderate muscle depletion, and no edema.   Labs reviewed. Medications reviewed.  Diet Order:  Diet clear liquid Room service appropriate?: Yes; Fluid consistency:: Thin  Skin:  Wound (see comment) (ileostomy site infection)  Last BM:  4/15  Height:   Ht Readings from Last 1 Encounters:  11/22/15 6\' 2"  (1.88 m)    Weight:   Wt Readings from Last 1 Encounters:  11/22/15 143 lb 6.4 oz (65.046 kg)    Ideal Body Weight:  86.3 kg  BMI:  Body mass index is 18.4 kg/(m^2).  Estimated Nutritional Needs:   Kcal:  [id number]  Protein:  105-115g  Fluid:  2.3L/day  EDUCATION NEEDS:   Education needs addressed  Clayton Bibles, MS, RD, LDN Pager: [phone] After Hours Pager: [phone]

## 2015-11-25 DIAGNOSIS — R509 Fever, unspecified: Secondary | ICD-10-CM | POA: Diagnosis not present

## 2015-11-25 DIAGNOSIS — T8132XA Disruption of internal operation (surgical) wound, not elsewhere classified, initial encounter: Secondary | ICD-10-CM | POA: Diagnosis not present

## 2015-11-25 DIAGNOSIS — D62 Acute posthemorrhagic anemia: Secondary | ICD-10-CM | POA: Diagnosis not present

## 2015-11-25 DIAGNOSIS — R197 Diarrhea, unspecified: Secondary | ICD-10-CM | POA: Diagnosis not present

## 2015-11-25 DIAGNOSIS — K929 Disease of digestive system, unspecified: Secondary | ICD-10-CM | POA: Diagnosis not present

## 2015-11-25 LAB — CBC
HEMATOCRIT: 26.2 % — AB (ref 39.0–52.0)
HEMOGLOBIN: 8.2 g/dL — AB (ref 13.0–17.0)
MCH: 25.2 pg — AB (ref 26.0–34.0)
MCHC: 31.3 g/dL (ref 30.0–36.0)
MCV: 80.4 fL (ref 78.0–100.0)
Platelets: 406 10*3/uL — ABNORMAL HIGH (ref 150–400)
RBC: 3.26 MIL/uL — AB (ref 4.22–5.81)
RDW: 16.4 % — AB (ref 11.5–15.5)
WBC: 5.3 10*3/uL (ref 4.0–10.5)

## 2015-11-25 LAB — BASIC METABOLIC PANEL
ANION GAP: 8 (ref 5–15)
BUN: 7 mg/dL (ref 6–20)
CHLORIDE: 105 mmol/L (ref 101–111)
CO2: 29 mmol/L (ref 22–32)
CREATININE: 0.8 mg/dL (ref 0.61–1.24)
Calcium: 8.4 mg/dL — ABNORMAL LOW (ref 8.9–10.3)
GFR calc non Af Amer: >60 mL/min (ref >60)
Glucose, Bld: 98 mg/dL (ref 65–99)
POTASSIUM: 4.2 mmol/L (ref 3.5–5.1)
Sodium: 142 mmol/L (ref 135–145)

## 2015-11-25 MED ORDER — CIPROFLOXACIN HCL 500 MG PO TABS
500.0000 mg | ORAL_TABLET | Freq: Two times a day (BID) | ORAL | Status: DC
  Administered 2015-11-25 – 2015-11-26 (×2): 500 mg via ORAL
  Filled 2015-11-25 (×3): qty 1

## 2015-11-25 MED ORDER — METRONIDAZOLE 500 MG PO TABS
500.0000 mg | ORAL_TABLET | Freq: Three times a day (TID) | ORAL | Status: DC
  Administered 2015-11-25 – 2015-11-26 (×3): 500 mg via ORAL
  Filled 2015-11-25 (×3): qty 1

## 2015-11-25 NOTE — Progress Notes (Signed)
Fever  Subjective: Pt here with diarrhea and fevers.  Has had this once before since his reversal  Objective: Vital signs in last 24 hours: Temp:  [97.8 F (36.6 C)-98.2 F (36.8 C)] 97.8 F (36.6 C) (04/17 0600) Pulse Rate:  [66-87] 66 (04/17 0600) Resp:  [14-16] 16 (04/17 0600) BP: (94-112)/(56-60) 94/56 mmHg (04/17 0600) SpO2:  [99 %-100 %] 100 % (04/17 0600) Last BM Date: 11/23/15  Intake/Output from previous day: 04/16 0701 - 04/17 0700 In: 1840 [P.O.:1840] Out: -  Intake/Output this shift:    General appearance: alert and cooperative GI: soft, nontender, nondistended  Lab Results:  Results for orders placed or performed during the hospital encounter of 11/22/15 (from the past 24 hour(s))  Basic metabolic panel     Status: Abnormal   Collection Time: 11/25/15  5:02 AM  Result Value Ref Range   Sodium 142 135 - 145 mmol/L   Potassium 4.2 3.5 - 5.1 mmol/L   Chloride 105 101 - 111 mmol/L   CO2 29 22 - 32 mmol/L   Glucose, Bld 98 65 - 99 mg/dL   BUN 7 6 - 20 mg/dL   Creatinine, Ser 0.80 0.61 - 1.24 mg/dL   Calcium 8.4 (L) 8.9 - 10.3 mg/dL   GFR calc non Af Amer >60 >60 mL/min   GFR calc Af Amer >60 >60 mL/min   Anion gap 8 5 - 15  CBC     Status: Abnormal   Collection Time: 11/25/15  5:02 AM  Result Value Ref Range   WBC 5.3 4.0 - 10.5 K/uL   RBC 3.26 (L) 4.22 - 5.81 MIL/uL   Hemoglobin 8.2 (L) 13.0 - 17.0 g/dL   HCT 26.2 (L) 39.0 - 52.0 %   MCV 80.4 78.0 - 100.0 fL   MCH 25.2 (L) 26.0 - 34.0 pg   MCHC 31.3 30.0 - 36.0 g/dL   RDW 16.4 (H) 11.5 - 15.5 %   Platelets 406 (H) 150 - 400 K/uL     Studies/Results Radiology     MEDS, Scheduled . feeding supplement (ENSURE ENLIVE)  237 mL Oral QID  . piperacillin-tazobactam (ZOSYN)  IV  3.375 g Intravenous 3 times per day     Assessment: Fever PT with tenuous anastomosis after surgery for rectal cancer. Episode most likely caused due to his diarrhea and multiple BM's  Plan: Would switch to reg diet  and PO cip/flag.  Cont abx for 2-3 weeks to allow for this to heal.  No need for re-imaging right now.  Ok to d/c when tolerating his diet.  Pt has f/u with me next week.  Please let me know if you have any further questions.  LOS: 3 days    [name], MD [facility], [place]   11/25/2015 8:42 AM

## 2015-11-25 NOTE — Progress Notes (Signed)
[facility] PROGRESS NOTE   Seth Villanueva [id number] DOB: [birth year] DOA: 11/22/2015 PCP: Leonides Sake, MD  HPI/Subjective: Patient seen with his mother and father at bedside. Denies any fever or pain. Per general surgery advance diet, switch antibiotics to by mouth Cipro/Metro and discharge when he tolerates diet.  Assessment/Plan: Principal Problem:   Fever Active Problems:   Diarrhea   Hypoalbuminemia due to protein-calorie malnutrition (HCC)   Iron deficiency anemia   Acute blood loss anemia   Dehiscence of intestinal anastomosis   Protein-calorie malnutrition, severe    Rectal anastomosis dehiscence Reported fever at home, presented with fever of 99.8 in the hospital. CT scan showed rectal anastomotic dehiscence with free air and the rectal fat pad. Patient is not septic, does not even have leukocytosis. Advance diet, switch antibiotics to Cipro/Metro.  Diarrhea Stool negative for C. difficile, given a dose of Imodium, discontinue antidiarrheals.  Severe protein energy malnutrition Apparent with hypoalbuminemia, albumin is 2.7. Dietitian to evaluate, started on ensure, improve nutritional status to help with healing.  Anemia Iron studies showed anemia of chronic disease with low iron. Follow closely and transfuse if hemoglobin less than 7.5.   Code Status: Full Code Family Communication: Plan discussed with the patient. Disposition Plan: Remains inpatient Diet: Diet regular Room service appropriate?: Yes; Fluid consistency:: Thin  Consultants:  Gen. surgery  Procedures:  None  Antibiotics:  Zosyn (indicate start date, and stop date if known)   Objective: Filed Vitals:   11/24/15 2130 11/25/15 0600  BP: 101/56 94/56  Pulse: 74 66  Temp: 97.9 F (36.6 C) 97.8 F (36.6 C)  Resp: 16 16    Intake/Output Summary (Last 24 hours) at 11/25/15 1259 Last data filed at 11/24/15 1802  Gross per 24 hour  Intake    720 ml  Output       0 ml  Net    720 ml   Filed Weights   11/22/15 1537  Weight: 65.046 kg (143 lb 6.4 oz)    Exam: General: Alert and awake, oriented x3, not in any acute distress. HEENT: anicteric sclera, pupils reactive to light and accommodation, EOMI CVS: S1-S2 clear, no murmur rubs or gallops Chest: clear to auscultation bilaterally, no wheezing, rales or rhonchi Abdomen: soft nontender, nondistended, normal bowel sounds, no organomegaly Extremities: no cyanosis, clubbing or edema noted bilaterally Neuro: Cranial nerves II-XII intact, no focal neurological deficits  Data Reviewed: Basic Metabolic Panel:  Recent Labs Lab 11/22/15 1057 11/22/15 1528 11/23/15 0530 11/24/15 0500 11/25/15 0502  NA 140  --  144 141 142  K 4.0  --  4.3 4.0 4.2  CL  --   --  107 104 105  CO2 25  --  28 27 29   GLUCOSE 90  --  109* 97 98  BUN 8.4  --  11 6 7   CREATININE 0.8 0.89 0.80 0.85 0.80  CALCIUM 9.0  --  7.9* 8.5* 8.4*   Liver Function Tests:  Recent Labs Lab 11/22/15 1057  AST 13  ALT 11  ALKPHOS 96  BILITOT 0.33  PROT 7.3  ALBUMIN 2.7*   No results for input(s): LIPASE, AMYLASE in the last 168 hours. No results for input(s): AMMONIA in the last 168 hours. CBC:  Recent Labs Lab 11/22/15 1057 11/22/15 1528 11/23/15 0530 11/24/15 0500 11/25/15 0502  WBC 7.9 7.7 5.5 6.9 5.3  NEUTROABS 5.0  --   --   --   --   HGB 9.7* 9.5* 7.9* 9.0* 8.2*  HCT 30.7* 29.9* 25.1* 29.0* 26.2*  MCV 79.5 79.3 80.7 78.6 80.4  PLT 355 358 308 444* 406*   Cardiac Enzymes: No results for input(s): CKTOTAL, CKMB, CKMBINDEX, TROPONINI in the last 168 hours. BNP (last 3 results) No results for input(s): BNP in the last 8760 hours.  ProBNP (last 3 results) No results for input(s): PROBNP in the last 8760 hours.  CBG: No results for input(s): GLUCAP in the last 168 hours.  Micro Recent Results (from the past 240 hour(s))  Culture, Blood     Status: None (Preliminary result)   Collection Time: 11/22/15  10:57 AM  Result Value Ref Range Status   BLOOD CULTURE, ROUTINE Preliminary report  Preliminary   RESULT 1 Comment  Preliminary    Comment: No growth in 36 - 48 hours.  Urine Culture     Status: None   Collection Time: 11/22/15 10:58 AM  Result Value Ref Range Status   Urine Culture, Routine Final report  Final   Urine Culture result 1 No growth  Final  Culture, Blood     Status: None (Preliminary result)   Collection Time: 11/22/15 11:01 AM  Result Value Ref Range Status   BLOOD CULTURE, ROUTINE Preliminary report  Preliminary   RESULT 1 Comment  Preliminary    Comment: No growth in 36 - 48 hours.  C difficile quick scan w PCR reflex     Status: None   Collection Time: 11/22/15  2:15 PM  Result Value Ref Range Status   C Diff antigen NEGATIVE NEGATIVE Final   C Diff toxin NEGATIVE NEGATIVE Final   C Diff interpretation Negative for toxigenic C. difficile  Final     Studies: No results found.  Scheduled Meds: . feeding supplement (ENSURE ENLIVE)  237 mL Oral QID  . piperacillin-tazobactam (ZOSYN)  IV  3.375 g Intravenous 3 times per day   Continuous Infusions:       Time spent: 35 minutes    [facility] A  [facility] Pager [phone] If 7PM-7AM, please contact night-coverage at [email or web address], password [facility] 11/25/2015, 12:59 PM  LOS: 3 days

## 2015-11-26 DIAGNOSIS — K929 Disease of digestive system, unspecified: Secondary | ICD-10-CM | POA: Diagnosis not present

## 2015-11-26 DIAGNOSIS — T8132XA Disruption of internal operation (surgical) wound, not elsewhere classified, initial encounter: Secondary | ICD-10-CM | POA: Diagnosis not present

## 2015-11-26 DIAGNOSIS — R509 Fever, unspecified: Secondary | ICD-10-CM | POA: Diagnosis not present

## 2015-11-26 DIAGNOSIS — D62 Acute posthemorrhagic anemia: Secondary | ICD-10-CM | POA: Diagnosis not present

## 2015-11-26 DIAGNOSIS — R197 Diarrhea, unspecified: Secondary | ICD-10-CM | POA: Diagnosis not present

## 2015-11-26 LAB — BASIC METABOLIC PANEL
ANION GAP: 9 (ref 5–15)
BUN: 14 mg/dL (ref 6–20)
CHLORIDE: 106 mmol/L (ref 101–111)
CO2: 28 mmol/L (ref 22–32)
Calcium: 8.5 mg/dL — ABNORMAL LOW (ref 8.9–10.3)
Creatinine, Ser: 0.87 mg/dL (ref 0.61–1.24)
GFR calc Af Amer: >60 mL/min (ref >60)
GLUCOSE: 91 mg/dL (ref 65–99)
POTASSIUM: 4 mmol/L (ref 3.5–5.1)
SODIUM: 143 mmol/L (ref 135–145)

## 2015-11-26 LAB — CBC
HEMATOCRIT: 29 % — AB (ref 39.0–52.0)
HEMOGLOBIN: 9 g/dL — AB (ref 13.0–17.0)
MCH: 24.4 pg — ABNORMAL LOW (ref 26.0–34.0)
MCHC: 31 g/dL (ref 30.0–36.0)
MCV: 78.6 fL (ref 78.0–100.0)
Platelets: 437 10*3/uL — ABNORMAL HIGH (ref 150–400)
RBC: 3.69 MIL/uL — ABNORMAL LOW (ref 4.22–5.81)
RDW: 16.1 % — ABNORMAL HIGH (ref 11.5–15.5)
WBC: 5.2 10*3/uL (ref 4.0–10.5)

## 2015-11-26 MED ORDER — HEPARIN SOD (PORK) LOCK FLUSH 100 UNIT/ML IV SOLN
500.0000 [IU] | Freq: Once | INTRAVENOUS | Status: DC
Start: 2015-11-26 — End: 2015-11-26
  Filled 2015-11-26: qty 5

## 2015-11-26 MED ORDER — CIPROFLOXACIN HCL 500 MG PO TABS: 500.0000 mg | ORAL_TABLET | Freq: Two times a day (BID) | ORAL | Status: DC

## 2015-11-26 MED ORDER — OXYCODONE-ACETAMINOPHEN 5-325 MG PO TABS: 1.0000 | ORAL_TABLET | Freq: Four times a day (QID) | ORAL | Status: DC | PRN

## 2015-11-26 MED ORDER — METRONIDAZOLE 500 MG PO TABS: 500.0000 mg | ORAL_TABLET | Freq: Three times a day (TID) | ORAL | Status: DC

## 2015-11-26 NOTE — Discharge Summary (Signed)
Physician Discharge Summary  Seth Villanueva [id number] DOB: 1980-09-07 DOA: 11/22/2015  PCP: Leonides Sake, MD  Admit date: 11/22/2015 Discharge date: 11/26/2015  Time spent: 40 minutes  Recommendations for Outpatient Follow-up:  1. Follow-up with Dr. Marcello Moores next week.   Discharge Diagnoses:  Principal Problem:   Fever Active Problems:   Diarrhea   Hypoalbuminemia due to protein-calorie malnutrition (HCC)   Iron deficiency anemia   Acute blood loss anemia   Dehiscence of intestinal anastomosis   Protein-calorie malnutrition, severe   Discharge Condition: Stable  Diet recommendation: Regular diet  Filed Weights   11/22/15 1537  Weight: 65.046 kg (143 lb 6.4 oz)    History of present illness:  Seth Villanueva is a 35 y.o. male diagnosis rectal adenocarcinoma on 01/26/2014 status post radiation and and robotic-assisted ultra low anterior resection and a diverting loop ileostomy on 05/18/2014 chemotherapy, who then developed colorectal perforation which was repair, ileostomy reversal done on 07/12/2015, who then developed some complications and an abscess at the site of the reversal the abscess site was opened and drained and was treated with empiric antibiotic, he relates he's been having fevers at home, and episodes of watery diarrhea with severe and painful spasms, he still has an appetite has no nausea or vomiting.   Hospital Course:   Rectal anastomosis dehiscence Reported fever at home, presented with fever of 99.8 in the hospital. CT scan showed rectal anastomotic dehiscence with free air and the rectal fat pad. Patient is not septic, does not even have leukocytosis. Per general surgery this could be secondary to recent diarrhea and multiple bowel movements. Diet advance, Dr. Marcello Moores recommended discharge patient home on Cipro/Metro for 2-3 weeks. On the day of discharge recommendation discussed with the patient in the presence of his mother/father at  bedside.  Diarrhea Stool negative for C. difficile, given a dose of Imodium, discontinue antidiarrheals.  Severe protein energy malnutrition Apparent with hypoalbuminemia, albumin is 2.7. Dietitian to evaluate, started on ensure, improve nutritional status to help with healing.  Anemia Iron studies showed anemia of chronic disease with low iron. Follow closely and transfuse if hemoglobin less than 7.5.    Procedures:  None)  Consultations:  Gen. surgery  Discharge Exam: Filed Vitals:   11/25/15 2126 11/26/15 0541  BP: 99/52 101/61  Pulse: 66 64  Temp: 97.6 F (36.4 C) 97.7 F (36.5 C)  Resp: 18 18   General: Alert and awake, oriented x3, not in any acute distress. HEENT: anicteric sclera, pupils reactive to light and accommodation, EOMI CVS: S1-S2 clear, no murmur rubs or gallops Chest: clear to auscultation bilaterally, no wheezing, rales or rhonchi Abdomen: soft nontender, nondistended, normal bowel sounds, no organomegaly Extremities: no cyanosis, clubbing or edema noted bilaterally Neuro: Cranial nerves II-XII intact, no focal neurological deficits  Discharge Instructions   Discharge Instructions    Diet - low sodium heart healthy    Complete by:  As directed      Increase activity slowly    Complete by:  As directed           Current Discharge Medication List    START taking these medications   Details  ciprofloxacin (CIPRO) 500 MG tablet Take 1 tablet (500 mg total) by mouth 2 (two) times daily. Qty: 42 tablet, Refills: 0    metroNIDAZOLE (FLAGYL) 500 MG tablet Take 1 tablet (500 mg total) by mouth every 8 (eight) hours. Qty: 63 tablet, Refills: 0      CONTINUE these medications which  have CHANGED   Details  oxyCODONE-acetaminophen (PERCOCET/ROXICET) 5-325 MG tablet Take 1 tablet by mouth every 6 (six) hours as needed for moderate pain or severe pain. Qty: 30 tablet, Refills: 0      CONTINUE these medications which have NOT CHANGED   Details   acetaminophen (TYLENOL) 325 MG tablet Take 325 mg by mouth once as needed for mild pain (pain). Reported on 07/29/2015    bismuth subsalicylate (PEPTO BISMOL) 262 MG/15ML suspension Take 30 mLs by mouth daily as needed for diarrhea or loose stools.    cetirizine (ZYRTEC) 10 MG tablet Take 10 mg by mouth daily as needed for allergies.    LORazepam (ATIVAN) 0.5 MG tablet Take 0.25 mg by mouth every 6 (six) hours as needed for anxiety.    Ondansetron HCl (ZOFRAN PO) Take 1 tablet by mouth every 6 (six) hours as needed (nausea and vomitting.).    simethicone (MYLICON) 80 MG chewable tablet Chew 80 mg by mouth every 6 (six) hours as needed for flatulence.      STOP taking these medications     loperamide (IMODIUM A-D) 2 MG tablet      pseudoephedrine (SUDAFED) 60 MG tablet        Allergies  Allergen Reactions  . Zithromax [Azithromycin] Hives  . Compazine [Prochlorperazine] Anxiety   Follow-up Information    Follow up with [name]., MD In 2 weeks.   Specialty:  General Surgery   Contact information:   [place] [address] [phone]        The results of significant diagnostics from this hospitalization (including imaging, microbiology, ancillary and laboratory) are listed below for reference.    Significant Diagnostic Studies: X-ray Chest Pa And Lateral  11/22/2015  CLINICAL DATA:  Fever. Rectal bleeding. History of rectal cancer. Ex-smoker. EXAM: CHEST  2 VIEW COMPARISON:  04/22/2015. FINDINGS: Normal sized heart. Clear lungs. Stable left subclavian porta catheter. Normal appearing bones. IMPRESSION: No acute abnormality. Electronically Signed   By: Claudie Revering M.D.   On: 11/22/2015 19:10   Ct Abdomen Pelvis W Contrast  11/22/2015  CLINICAL DATA:  Rectal carcinoma, low anterior resection, diarrhea and rectal pain. EXAM: CT ABDOMEN AND PELVIS WITH CONTRAST TECHNIQUE: Multidetector CT imaging of the abdomen and pelvis was performed using  the standard protocol following bolus administration of intravenous contrast. CONTRAST:  159mL ISOVUE-300 IOPAMIDOL (ISOVUE-300) INJECTION 61% COMPARISON:  04/22/2015. FINDINGS: Lower chest: Lung bases show no acute findings. Heart size normal. No pericardial or pleural effusion. Hepatobiliary: Liver and gallbladder are unremarkable. No biliary ductal dilatation. Pancreas: Negative. Spleen: Negative. Adrenals/Urinary Tract: Adrenal glands and kidneys are unremarkable. Ureters are decompressed. Bladder is grossly unremarkable. Stomach/Bowel: Stomach and majority of the small bowel are unremarkable. There is an ileocecal anastomosis. Majority of the colon is unremarkable. Increased presacral soft tissue thickening, some of which is high in attenuation, with multiple locules of extraluminal air adjacent to the rectal anastomosis (series 2, images 65-84). Vascular/Lymphatic: Vascular structures are unremarkable. Retroperitoneal lymph nodes are not enlarged by CT size criteria. Reproductive: Prostate is normal in size. Other: Presacral soft tissue thickening and extraluminal air are discussed above. Mesenteries and peritoneum are otherwise unremarkable. No free fluid. Musculoskeletal: No worrisome lytic or sclerotic lesions. IMPRESSION: Heterogeneous presacral soft tissue thickening with extensive extraluminal air, highly suggestive of dehiscence at the rectal anastomosis. Electronically Signed   By: Lorin Picket M.D.   On: 11/22/2015 20:26    Microbiology: Recent Results (from the past  240 hour(s))  Culture, Blood     Status: None (Preliminary result)   Collection Time: 11/22/15 10:57 AM  Result Value Ref Range Status   BLOOD CULTURE, ROUTINE Preliminary report  Preliminary   RESULT 1 Comment  Preliminary    Comment: No growth in 36 - 48 hours.  Urine Culture     Status: None   Collection Time: 11/22/15 10:58 AM  Result Value Ref Range Status   Urine Culture, Routine Final report  Final   Urine Culture  result 1 No growth  Final  Culture, Blood     Status: None (Preliminary result)   Collection Time: 11/22/15 11:01 AM  Result Value Ref Range Status   BLOOD CULTURE, ROUTINE Preliminary report  Preliminary   RESULT 1 Comment  Preliminary    Comment: No growth in 36 - 48 hours.  C difficile quick scan w PCR reflex     Status: None   Collection Time: 11/22/15  2:15 PM  Result Value Ref Range Status   C Diff antigen NEGATIVE NEGATIVE Final   C Diff toxin NEGATIVE NEGATIVE Final   C Diff interpretation Negative for toxigenic C. difficile  Final     Labs: Basic Metabolic Panel:  Recent Labs Lab 11/22/15 1057 11/22/15 1528 11/23/15 0530 11/24/15 0500 11/25/15 0502 11/26/15 0510  NA 140  --  144 141 142 143  K 4.0  --  4.3 4.0 4.2 4.0  CL  --   --  107 104 105 106  CO2 25  --  28 27 29 28   GLUCOSE 90  --  109* 97 98 91  BUN 8.4  --  11 6 7 14   CREATININE 0.8 0.89 0.80 0.85 0.80 0.87  CALCIUM 9.0  --  7.9* 8.5* 8.4* 8.5*   Liver Function Tests:  Recent Labs Lab 11/22/15 1057  AST 13  ALT 11  ALKPHOS 96  BILITOT 0.33  PROT 7.3  ALBUMIN 2.7*   No results for input(s): LIPASE, AMYLASE in the last 168 hours. No results for input(s): AMMONIA in the last 168 hours. CBC:  Recent Labs Lab 11/22/15 1057 11/22/15 1528 11/23/15 0530 11/24/15 0500 11/25/15 0502 11/26/15 0510  WBC 7.9 7.7 5.5 6.9 5.3 5.2  NEUTROABS 5.0  --   --   --   --   --   HGB 9.7* 9.5* 7.9* 9.0* 8.2* 9.0*  HCT 30.7* 29.9* 25.1* 29.0* 26.2* 29.0*  MCV 79.5 79.3 80.7 78.6 80.4 78.6  PLT 355 358 308 444* 406* 437*   Cardiac Enzymes: No results for input(s): CKTOTAL, CKMB, CKMBINDEX, TROPONINI in the last 168 hours. BNP: BNP (last 3 results) No results for input(s): BNP in the last 8760 hours.  ProBNP (last 3 results) No results for input(s): PROBNP in the last 8760 hours.  CBG: No results for input(s): GLUCAP in the last 168 hours.     Signed:  Birdie Hopes MD.  [facility] 11/26/2015, 1:30 PM

## 2015-11-28 LAB — CULTURE, BLOOD (SINGLE)

## 2015-12-06 ENCOUNTER — Telehealth: Payer: Self-pay

## 2015-12-06 ENCOUNTER — Other Ambulatory Visit: Payer: Self-pay | Admitting: *Deleted

## 2015-12-06 ENCOUNTER — Telehealth: Payer: Self-pay | Admitting: Oncology

## 2015-12-06 DIAGNOSIS — C20 Malignant neoplasm of rectum: Secondary | ICD-10-CM

## 2015-12-06 NOTE — Telephone Encounter (Signed)
spoke w/ pt mother comfirmed added lab appt

## 2015-12-06 NOTE — Progress Notes (Signed)
Pt.'s mother called office inquiring if pt would benefit from iron supplement.  Per Dr. Benay Spice, pt to take Ferrous Sulfate 325 mg PO BID and have CBC drawn in 2 weeks.  Pt.'s mother given instructions and verbalizes an understanding of information.  No further questions or concerns from pt.'s mother at this time.

## 2015-12-06 NOTE — Telephone Encounter (Signed)
Patient's mother called asking whether it is ok for patient to take iron supplement while on a antibiotic.  Patient's mother states that he has been extremely fatigued and she thought some iron might help.  Writer transferred her to MD's desk so she could get advice from his nurse.

## 2015-12-08 ENCOUNTER — Telehealth: Payer: Self-pay | Admitting: Oncology

## 2015-12-08 NOTE — Telephone Encounter (Signed)
no vm..mailed pt appt sched and letter regarding to 6.19 appt moved to 6.20 due to MD on call

## 2015-12-15 ENCOUNTER — Encounter: Payer: Self-pay | Admitting: General Surgery

## 2015-12-15 NOTE — Progress Notes (Signed)
He called today having recurrent perianal swelling, some bleeding and pain after having to strain to complete a BM.  Apparently, this is something that has happened before.  He reported having a fever of 100.7 last night but is afebrile now.  I advised him to do warm cloth massages and tub soaks and if it got worse to go to the ED.  If not, then to call the office tomorrow to be seen.

## 2015-12-16 ENCOUNTER — Other Ambulatory Visit: Payer: Self-pay | Admitting: General Surgery

## 2015-12-16 NOTE — H&P (Signed)
History of Present Illness [name]; 123456 11:06 AM) Patient words: anal pain and swelling.  The patient is a 35 year old male presenting for a post-operative visit. Patient is status post ileostomy reversal after low anterior resection and diverting ileostomy in early Dec 2016. We had some difficulty with anastomotic leak and stricture after his original surgery. This required 2 different procedures to obtain patency of his anastomosis. After these healed we reversed his ileostomy. He was recently saw for an anal fissure. At that time his ileostomy site appeared to be healing well and silver nitrate was applied to an area of granulation tissue. He continues to have trouble perirectally. He completed his course of antibiotics approximately 2 days ago. After that he developed low-grade fevers and worsening pain. He has experienced some bloody drainage from his perianal region as well.   Problem List/Past Medical [name], MD; 123456 11:06 AM) POST-OPERATIVE STATE [phone]) ACUTE ANAL FISSURE (K60.0) PRIMARY CANCER OF RECTUM (C20) ANAL ABSCESS (K61.0) ANAL STRICTURE (K62.4) OPEN WOUND OF LATERAL ABDOMINAL WALL, SEQUELA (S31.109S)  Other Problems [name], MD; 123456 11:06 AM) Rectal Cancer Hemorrhoids  Past Surgical History [name], MD; 123456 11:06 AM) Colon Removal - Partial RECTAL EUA [id number]) 11/15/2014  Diagnostic Studies History [name], MD; 123456 11:06 AM) Colonoscopy within last year  Allergies Mammie Lorenzo, LPN; 624THL 624THL AM) Azithromycin *CHEMICALS* Hives. Compazine *ANTIPSYCHOTICS/ANTIMANIC AGENTS* anxiety  Medication History [name], MD; 123456 11:06 AM) Medications Reconciled Cipro (500MG  Tablet, 1 (one) Tablet Oral two times daily, Taken starting 12/16/2015) Active. Flagyl (500MG  Tablet, 2 (two) Oral bid, Taken starting 12/16/2015) Active. Lidocaine (5% Ointment, 1 application  External three times daily, as needed, Taken starting 08/30/2015) Active. Tylenol (325MG  Capsule, Oral) Active. Ibuprofen (200MG  Capsule, Oral prn) Active. Imodium A-D (2MG  Tablet, Oral prn) Active. Mylicon (40MG /0.6ML Suspension, Oral prn) Active. Sudafed (30MG  Tablet, Oral prn) Active. Acetaminophen (325MG  Tablet, Oral prn) Active.  Social History [name], MD; 123456 11:06 AM) Tobacco use Former smoker. No caffeine use No drug use Alcohol use Occasional alcohol use.  Family History [name], MD; 123456 11:06 AM) Heart disease in male family member before age 68 Hypertension Family Members In General. Thyroid problems Family Members In General. Breast Cancer Family Members In General. Arthritis Family Members In General. Heart Disease Family Members In General. Heart disease in male family member before age 52     Review of Systems [name]; 123456 11:07 AM) General Not Present- Appetite Loss, Chills, Fatigue, Fever, Night Sweats, Weight Gain and Weight Loss. Skin Not Present- Change in Wart/Mole, Dryness, Hives, Jaundice, New Lesions, Non-Healing Wounds, Rash and Ulcer. HEENT Not Present- Earache, Hearing Loss, Hoarseness, Nose Bleed, Oral Ulcers, Ringing in the Ears, Seasonal Allergies, Sinus Pain, Sore Throat, Visual Disturbances, Wears glasses/contact lenses and Yellow Eyes. Respiratory Not Present- Bloody sputum, Chronic Cough, Difficulty Breathing, Snoring and Wheezing. Breast Not Present- Breast Mass, Breast Pain, Nipple Discharge and Skin Changes. Cardiovascular Not Present- Chest Pain, Difficulty Breathing Lying Down, Leg Cramps, Palpitations, Rapid Heart Rate, Shortness of Breath and Swelling of Extremities. Gastrointestinal Not Present- Abdominal Pain, Bloody Stool, Change in Bowel Habits, Chronic diarrhea, Constipation, Difficulty Swallowing, Excessive gas, Gets full quickly at meals, Indigestion, Nausea, Rectal Pain and  Vomiting. Male Genitourinary Not Present- Blood in Urine, Change in Urinary Stream, Frequency, Impotence, Nocturia, Painful Urination, Urgency and Urine Leakage. Musculoskeletal Not Present- Back Pain, Joint Pain, Joint Stiffness, Muscle Pain, Muscle Weakness and Swelling of Extremities. Neurological Not Present- Decreased Memory, Fainting,  Headaches, Numbness, Seizures, Tingling, Tremor, Trouble walking and Weakness. Psychiatric Not Present- Anxiety, Bipolar, Change in Sleep Pattern, Depression, Fearful and Frequent crying. Endocrine Not Present- Cold Intolerance, Excessive Hunger, Hair Changes, Heat Intolerance and New Diabetes. Hematology Not Present- Easy Bruising, Excessive bleeding, Gland problems, HIV and Persistent Infections.  Vitals Claiborne Billings Dockery LPN; 624THL 624THL AM) 12/16/2015 10:15 AM Weight: 99.1 lb Height: 74in Body Surface Area: 1.61 m Body Mass Index: 12.72 kg/m  Temp.: 99.24F(Oral)  Pulse: 120 (Regular)  BP: 142/82 (Sitting, Left Arm, Standard)      Physical Exam [name]; 123456 11:07 AM)  General Mental Status-Alert. General Appearance-Cooperative, Lethargic / slow, Not in acute distress. Build & Nutrition-Well nourished. Posture-Normal posture. Gait-Normal.  Head and Neck Head-normocephalic, atraumatic with no lesions or palpable masses.  Chest and Lung Exam Chest and lung exam reveals -on auscultation, normal breath sounds, no adventitious sounds and normal vocal resonance.  Cardiovascular Cardiovascular examination reveals -normal heart sounds, regular rate and rhythm with no murmurs.  Abdomen Inspection Skin - Scar - Note: ileostomy site with one area in the lateral aspect of the wound that has not completely healed. This is approximately 82mm in depth and 74mm wide. No signs of infection noted. . Palpation/Percussion Palpation and Percussion of the abdomen reveal - Soft. Note: mildly  distended.  Rectal Anorectal Exam Internal - Note: Anastomosis patent. Slight stricture palpated, no obvious masses. Unable to perform more proximal exam due to pain.    Assessment & Plan [name]; 123456 10:34 AM)  ANAL ABSCESS (K61.0) Impression: 35 year old male with history of coloanal anastomosis who presents with low-grade fevers and anal discomfort as well as purulent drainage. On exam today he has a draining abscess in the anterior portion of his anal canal. Given his complex surgical history, I recommended an anal exam under anesthesia for further evaluation.

## 2015-12-19 ENCOUNTER — Encounter (HOSPITAL_BASED_OUTPATIENT_CLINIC_OR_DEPARTMENT_OTHER): Payer: Self-pay | Admitting: *Deleted

## 2015-12-19 NOTE — Progress Notes (Signed)
NPO AFTER MN.  ARRIVE AT 0800.  CURRENT LAB RESULTS AND EKG IN CHART AND EPIC.  MAY TAKE PRN RX IF NEEDED AM DOS W/ SIPS OF WATER.

## 2015-12-20 ENCOUNTER — Other Ambulatory Visit (HOSPITAL_BASED_OUTPATIENT_CLINIC_OR_DEPARTMENT_OTHER): Payer: 59

## 2015-12-20 ENCOUNTER — Telehealth: Payer: Self-pay | Admitting: *Deleted

## 2015-12-20 DIAGNOSIS — C20 Malignant neoplasm of rectum: Secondary | ICD-10-CM

## 2015-12-20 DIAGNOSIS — Z85048 Personal history of other malignant neoplasm of rectum, rectosigmoid junction, and anus: Secondary | ICD-10-CM | POA: Diagnosis not present

## 2015-12-20 LAB — CBC WITH DIFFERENTIAL/PLATELET
BASO%: 0.5 % (ref 0.0–2.0)
Basophils Absolute: 0 10*3/uL (ref 0.0–0.1)
EOS ABS: 0.2 10*3/uL (ref 0.0–0.5)
EOS%: 2.4 % (ref 0.0–7.0)
HCT: 37.5 % — ABNORMAL LOW (ref 38.4–49.9)
HEMOGLOBIN: 11.8 g/dL — AB (ref 13.0–17.1)
LYMPH%: 23.4 % (ref 14.0–49.0)
MCH: 24.5 pg — ABNORMAL LOW (ref 27.2–33.4)
MCHC: 31.5 g/dL — ABNORMAL LOW (ref 32.0–36.0)
MCV: 77.9 fL — AB (ref 79.3–98.0)
MONO#: 0.8 10*3/uL (ref 0.1–0.9)
MONO%: 9.5 % (ref 0.0–14.0)
NEUT%: 64.2 % (ref 39.0–75.0)
NEUTROS ABS: 5.1 10*3/uL (ref 1.5–6.5)
Platelets: 468 10*3/uL — ABNORMAL HIGH (ref 140–400)
RBC: 4.81 10*6/uL (ref 4.20–5.82)
RDW: 20.8 % — AB (ref 11.0–14.6)
WBC: 7.9 10*3/uL (ref 4.0–10.3)
lymph#: 1.9 10*3/uL (ref 0.9–3.3)

## 2015-12-20 NOTE — Telephone Encounter (Signed)
Per Dr. Benay Spice, pt.'s mother notified that HGB is better.  Pt.'s mother has no questions at this time and is appreciative of call.

## 2015-12-20 NOTE — Telephone Encounter (Signed)
-----   Message from Ladell Pier, MD sent at 12/20/2015  4:54 PM EDT ----- Please call patient, hemoglobin is better, f/u as scheduled

## 2015-12-26 ENCOUNTER — Ambulatory Visit (HOSPITAL_BASED_OUTPATIENT_CLINIC_OR_DEPARTMENT_OTHER)
Admission: RE | Admit: 2015-12-26 | Discharge: 2015-12-26 | Disposition: A | Discharge status: Home / Self Care | Payer: 59 | Source: Ambulatory Visit | Attending: General Surgery | Admitting: General Surgery

## 2015-12-26 ENCOUNTER — Ambulatory Visit (HOSPITAL_BASED_OUTPATIENT_CLINIC_OR_DEPARTMENT_OTHER): Payer: 59 | Admitting: Anesthesiology

## 2015-12-26 ENCOUNTER — Other Ambulatory Visit: Payer: Self-pay | Admitting: *Deleted

## 2015-12-26 ENCOUNTER — Encounter (HOSPITAL_BASED_OUTPATIENT_CLINIC_OR_DEPARTMENT_OTHER)
Admission: RE | Disposition: A | Discharge status: Home / Self Care | Payer: Self-pay | Source: Ambulatory Visit | Attending: General Surgery

## 2015-12-26 ENCOUNTER — Encounter (HOSPITAL_BASED_OUTPATIENT_CLINIC_OR_DEPARTMENT_OTHER): Payer: Self-pay | Admitting: *Deleted

## 2015-12-26 ENCOUNTER — Telehealth: Payer: Self-pay | Admitting: *Deleted

## 2015-12-26 ENCOUNTER — Telehealth: Payer: Self-pay | Admitting: Nurse Practitioner

## 2015-12-26 DIAGNOSIS — K624 Stenosis of anus and rectum: Secondary | ICD-10-CM | POA: Insufficient documentation

## 2015-12-26 DIAGNOSIS — K603 Anal fistula: Secondary | ICD-10-CM | POA: Insufficient documentation

## 2015-12-26 DIAGNOSIS — Z79899 Other long term (current) drug therapy: Secondary | ICD-10-CM | POA: Insufficient documentation

## 2015-12-26 DIAGNOSIS — Z87891 Personal history of nicotine dependence: Secondary | ICD-10-CM | POA: Insufficient documentation

## 2015-12-26 DIAGNOSIS — Z85048 Personal history of other malignant neoplasm of rectum, rectosigmoid junction, and anus: Secondary | ICD-10-CM | POA: Insufficient documentation

## 2015-12-26 DIAGNOSIS — Z980 Intestinal bypass and anastomosis status: Secondary | ICD-10-CM | POA: Diagnosis not present

## 2015-12-26 HISTORY — DX: Noninfective gastroenteritis and colitis, unspecified: K52.9

## 2015-12-26 HISTORY — DX: Nausea: R11.0

## 2015-12-26 HISTORY — DX: Iron deficiency: E61.1

## 2015-12-26 HISTORY — DX: Other disorders of plasma-protein metabolism, not elsewhere classified: E88.09

## 2015-12-26 HISTORY — PX: ANAL FISTULOTOMY: SHX6423

## 2015-12-26 HISTORY — DX: Anemia in neoplastic disease: D63.0

## 2015-12-26 HISTORY — DX: Unspecified protein-calorie malnutrition: E46

## 2015-12-26 SURGERY — ANAL FISTULOTOMY
Anesthesia: Monitor Anesthesia Care

## 2015-12-26 MED ORDER — ACETAMINOPHEN 650 MG RE SUPP
650.0000 mg | RECTAL | Status: DC | PRN
  Filled 2015-12-26: qty 1

## 2015-12-26 MED ORDER — BUPIVACAINE LIPOSOME 1.3 % IJ SUSP
INTRAMUSCULAR | Status: DC | PRN
  Administered 2015-12-26: 20 mL

## 2015-12-26 MED ORDER — SODIUM CHLORIDE 0.9 % IV SOLN
250.0000 mL | INTRAVENOUS | Status: DC | PRN
  Filled 2015-12-26: qty 250

## 2015-12-26 MED ORDER — PROPOFOL 500 MG/50ML IV EMUL
INTRAVENOUS | Status: AC
  Filled 2015-12-26: qty 50

## 2015-12-26 MED ORDER — OXYCODONE HCL 5 MG PO TABS
5.0000 mg | ORAL_TABLET | ORAL | Status: DC | PRN
  Filled 2015-12-26: qty 2

## 2015-12-26 MED ORDER — PROPOFOL 500 MG/50ML IV EMUL
INTRAVENOUS | Status: DC | PRN
  Administered 2015-12-26: 50 ug/kg/min via INTRAVENOUS

## 2015-12-26 MED ORDER — LORAZEPAM 0.5 MG PO TABS
0.5000 mg | ORAL_TABLET | Freq: Every day | ORAL | Status: DC | PRN
Start: 2015-12-26 — End: 2016-02-12

## 2015-12-26 MED ORDER — ONDANSETRON HCL 4 MG PO TABS: 4.0000 mg | ORAL_TABLET | Freq: Three times a day (TID) | ORAL | Status: DC | PRN

## 2015-12-26 MED ORDER — ACETAMINOPHEN 325 MG PO TABS
650.0000 mg | ORAL_TABLET | ORAL | Status: DC | PRN
  Filled 2015-12-26: qty 2

## 2015-12-26 MED ORDER — SODIUM CHLORIDE 0.9% FLUSH
3.0000 mL | INTRAVENOUS | Status: DC | PRN
  Filled 2015-12-26: qty 3

## 2015-12-26 MED ORDER — FENTANYL CITRATE (PF) 100 MCG/2ML IJ SOLN
INTRAMUSCULAR | Status: AC
  Filled 2015-12-26: qty 2

## 2015-12-26 MED ORDER — DEXAMETHASONE SODIUM PHOSPHATE 4 MG/ML IJ SOLN
INTRAMUSCULAR | Status: DC | PRN
  Administered 2015-12-26: 5 mg via INTRAVENOUS

## 2015-12-26 MED ORDER — PROPOFOL 10 MG/ML IV BOLUS
INTRAVENOUS | Status: DC | PRN
  Administered 2015-12-26 (×2): 50 mg via INTRAVENOUS

## 2015-12-26 MED ORDER — KETAMINE HCL 10 MG/ML IJ SOLN
INTRAMUSCULAR | Status: DC | PRN
  Administered 2015-12-26: 50 mg via INTRAVENOUS

## 2015-12-26 MED ORDER — MIDAZOLAM HCL 2 MG/2ML IJ SOLN
INTRAMUSCULAR | Status: AC
  Filled 2015-12-26: qty 4

## 2015-12-26 MED ORDER — OXYCODONE HCL 5 MG PO TABS: 5.0000 mg | ORAL_TABLET | Freq: Four times a day (QID) | ORAL | Status: DC | PRN

## 2015-12-26 MED ORDER — BUPIVACAINE-EPINEPHRINE 0.5% -1:200000 IJ SOLN
INTRAMUSCULAR | Status: DC | PRN
  Administered 2015-12-26: 30 mL

## 2015-12-26 MED ORDER — MIDAZOLAM HCL 5 MG/5ML IJ SOLN
INTRAMUSCULAR | Status: DC | PRN
  Administered 2015-12-26: 2 mg via INTRAVENOUS

## 2015-12-26 MED ORDER — LIDOCAINE 5 % EX OINT
TOPICAL_OINTMENT | CUTANEOUS | Status: DC | PRN
  Administered 2015-12-26: 1

## 2015-12-26 MED ORDER — ONDANSETRON HCL 4 MG/2ML IJ SOLN
INTRAMUSCULAR | Status: DC | PRN
  Administered 2015-12-26: 4 mg via INTRAVENOUS

## 2015-12-26 MED ORDER — SODIUM CHLORIDE 0.9% FLUSH
3.0000 mL | Freq: Two times a day (BID) | INTRAVENOUS | Status: DC
  Filled 2015-12-26: qty 3

## 2015-12-26 MED ORDER — LACTATED RINGERS IV SOLN
INTRAVENOUS | Status: DC
  Administered 2015-12-26 (×2): via INTRAVENOUS
  Filled 2015-12-26: qty 1000

## 2015-12-26 MED ORDER — LIDOCAINE HCL (CARDIAC) 20 MG/ML IV SOLN
INTRAVENOUS | Status: DC | PRN
  Administered 2015-12-26: 50 mg via INTRAVENOUS

## 2015-12-26 MED ORDER — HYDROMORPHONE HCL 1 MG/ML IJ SOLN
0.2500 mg | INTRAMUSCULAR | Status: DC | PRN
  Filled 2015-12-26: qty 1

## 2015-12-26 MED ORDER — ONDANSETRON HCL 4 MG/2ML IJ SOLN
INTRAMUSCULAR | Status: AC
  Filled 2015-12-26: qty 2

## 2015-12-26 MED ORDER — FENTANYL CITRATE (PF) 100 MCG/2ML IJ SOLN
INTRAMUSCULAR | Status: DC | PRN
  Administered 2015-12-26 (×4): 25 ug via INTRAVENOUS

## 2015-12-26 SURGICAL SUPPLY — 55 items
BENZOIN TINCTURE PRP APPL 2/3 (GAUZE/BANDAGES/DRESSINGS) ×3 IMPLANT
BLADE HEX COATED 2.75 (ELECTRODE) ×3 IMPLANT
BLADE SURG 10 STRL SS (BLADE) IMPLANT
BLADE SURG 15 STRL LF DISP TIS (BLADE) ×1 IMPLANT
BLADE SURG 15 STRL SS (BLADE) ×2
BRIEF STRETCH FOR OB PAD LRG (UNDERPADS AND DIAPERS) ×6 IMPLANT
CANISTER SUCTION 2500CC (MISCELLANEOUS) ×3 IMPLANT
COVER BACK TABLE 60X90IN (DRAPES) ×3 IMPLANT
COVER MAYO STAND STRL (DRAPES) ×3 IMPLANT
DECANTER SPIKE VIAL GLASS SM (MISCELLANEOUS) ×3 IMPLANT
DRAPE LAPAROTOMY 100X72 PEDS (DRAPES) ×3 IMPLANT
DRAPE LG THREE QUARTER DISP (DRAPES) ×3 IMPLANT
DRAPE UTILITY XL STRL (DRAPES) ×3 IMPLANT
DRSG PAD ABDOMINAL 8X10 ST (GAUZE/BANDAGES/DRESSINGS) ×3 IMPLANT
ELECT BLADE 6.5 .24CM SHAFT (ELECTRODE) IMPLANT
ELECT REM PT RETURN 9FT ADLT (ELECTROSURGICAL) ×3
ELECTRODE REM PT RTRN 9FT ADLT (ELECTROSURGICAL) ×1 IMPLANT
GAUZE SPONGE 4X4 16PLY XRAY LF (GAUZE/BANDAGES/DRESSINGS) IMPLANT
GAUZE VASELINE 3X9 (GAUZE/BANDAGES/DRESSINGS) IMPLANT
GLOVE BIO SURGEON STRL SZ 6.5 (GLOVE) ×4 IMPLANT
GLOVE BIO SURGEONS STRL SZ 6.5 (GLOVE) ×2
GLOVE INDICATOR 7.0 STRL GRN (GLOVE) ×6 IMPLANT
GOWN STRL REUS W/ TWL LRG LVL3 (GOWN DISPOSABLE) ×1 IMPLANT
GOWN STRL REUS W/ TWL XL LVL3 (GOWN DISPOSABLE) ×2 IMPLANT
GOWN STRL REUS W/TWL 2XL LVL3 (GOWN DISPOSABLE) ×3 IMPLANT
GOWN STRL REUS W/TWL LRG LVL3 (GOWN DISPOSABLE) ×2
GOWN STRL REUS W/TWL XL LVL3 (GOWN DISPOSABLE) ×4
HYDROGEN PEROXIDE 16OZ (MISCELLANEOUS) IMPLANT
KIT ROOM TURNOVER WOR (KITS) ×3 IMPLANT
LOOP VESSEL MAXI BLUE (MISCELLANEOUS) IMPLANT
MANIFOLD NEPTUNE II (INSTRUMENTS) IMPLANT
NDL SAFETY ECLIPSE 18X1.5 (NEEDLE) IMPLANT
NEEDLE HYPO 18GX1.5 SHARP (NEEDLE)
NEEDLE HYPO 25X1 1.5 SAFETY (NEEDLE) ×3 IMPLANT
NS IRRIG 500ML POUR BTL (IV SOLUTION) ×3 IMPLANT
PACK BASIN DAY SURGERY FS (CUSTOM PROCEDURE TRAY) ×3 IMPLANT
PAD ABD 8X10 STRL (GAUZE/BANDAGES/DRESSINGS) IMPLANT
PAD ARMBOARD 7.5X6 YLW CONV (MISCELLANEOUS) IMPLANT
PENCIL BUTTON HOLSTER BLD 10FT (ELECTRODE) ×3 IMPLANT
SPONGE GAUZE 4X4 12PLY (GAUZE/BANDAGES/DRESSINGS) IMPLANT
SPONGE GAUZE 4X4 12PLY STER LF (GAUZE/BANDAGES/DRESSINGS) ×3 IMPLANT
SPONGE SURGIFOAM ABS GEL 12-7 (HEMOSTASIS) IMPLANT
SUT CHROMIC 2 0 SH (SUTURE) IMPLANT
SUT CHROMIC 3 0 SH 27 (SUTURE) IMPLANT
SUT ETHIBOND 0 (SUTURE) IMPLANT
SUT SILK 3 0 SH CR/8 (SUTURE) ×3 IMPLANT
SUT VIC AB 4-0 P-3 18XBRD (SUTURE) IMPLANT
SUT VIC AB 4-0 P3 18 (SUTURE)
SUT VICRYL 3-0 CR8 SH (SUTURE) ×3 IMPLANT
SYR CONTROL 10ML LL (SYRINGE) ×3 IMPLANT
TOWEL OR 17X24 6PK STRL BLUE (TOWEL DISPOSABLE) ×3 IMPLANT
TRAY DSU PREP LF (CUSTOM PROCEDURE TRAY) ×3 IMPLANT
TUBE CONNECTING 12'X1/4 (SUCTIONS) ×1
TUBE CONNECTING 12X1/4 (SUCTIONS) ×2 IMPLANT
YANKAUER SUCT BULB TIP NO VENT (SUCTIONS) ×3 IMPLANT

## 2015-12-26 NOTE — Discharge Instructions (Addendum)
ANORECTAL SURGERY: POST OP INSTRUCTIONS °1. Take your usually prescribed home medications unless otherwise directed. °2. DIET: During the first few hours after surgery sip on some liquids until you are able to urinate.  It is normal to not urinate for several hours after this surgery.  If you feel uncomfortable, please contact the office for instructions.  After you are able to urinate,you may eat, if you feel like it.  Follow a light bland diet the first 24 hours after arrival home, such as soup, liquids, crackers, etc.  Be sure to include lots of fluids daily (6-8 glasses).  Avoid fast food or heavy meals, as your are more likely to get nauseated.  Eat a low fat diet the next few days after surgery.  Limit caffeine intake to 1-2 servings a day. °3. PAIN CONTROL: °a. Pain is best controlled by a usual combination of several different methods TOGETHER: °i. Muscle relaxation: Soak in a warm bath (or Sitz bath) three times a day and after bowel movements.  Continue to do this until all pain is resolved. °ii. Over the counter pain medication °iii. Prescription pain medication °b. Most patients will experience some swelling and discomfort in the anus/rectal area and incisions.  Heat such as warm towels, sitz baths, warm baths, etc to help relax tight/sore spots and speed recovery.  Some people prefer to use ice, especially in the first couple days after surgery, as it may decrease the pain and swelling, or alternate between ice & heat.  Experiment to what works for you.  Swelling and bruising can take several weeks to resolve.  Pain can take even longer to completely resolve. °c. It is helpful to take an over-the-counter pain medication regularly for the first few weeks.  Choose one of the following that works best for you: °i. Naproxen (Aleve, etc)  Two 220mg tabs twice a day °ii. Ibuprofen (Advil, etc) Three 200mg tabs four times a day (every meal & bedtime) °d. A  prescription for pain medication (such as percocet,  oxycodone, hydrocodone, etc) should be given to you upon discharge.  Take your pain medication as prescribed.  °i. If you are having problems/concerns with the prescription medicine (does not control pain, nausea, vomiting, rash, itching, etc), please call us [phone] to see if we need to switch you to a different pain medicine that will work better for you and/or control your side effect better. °ii. If you need a refill on your pain medication, please contact your pharmacy.  They will contact our office to request authorization. Prescriptions will not be filled after 5 pm or on week-ends. °4. KEEP YOUR BOWELS REGULAR and AVOID CONSTIPATION °a. The goal is one to two soft bowel movements a day.  You should at least have a bowel movement every other day. °b. Avoid getting constipated.  Between the surgery and the pain medications, it is common to experience some constipation. This can be very painful after rectal surgery.  Increasing fluid intake and taking a fiber supplement (such as Metamucil, Citrucel, FiberCon, etc) 1-2 times a day regularly will usually help prevent this problem from occurring.  A stool softener like colace is also recommended.  This can be purchased over the counter at your pharmacy.  You can take it up to 3 times a day.  If you do not have a bowel movement after 24 hrs since your surgery, take one does of milk of magnesia.  If you still haven't had a bowel movement 8-12 hours after   that dose, take another dose.  If you don't have a bowel movement 48 hrs after surgery, purchase a Fleets enema from the drug store and administer gently per package instructions.  If you still are having trouble with your bowel movements after that, please call the office for further instructions. °c. If you develop diarrhea or have many loose bowel movements, simplify your diet to bland foods & liquids for a few days.  Stop any stool softeners and decrease your fiber supplement.  Switching to mild  anti-diarrheal medications (Kayopectate, Pepto Bismol) can help.  If this worsens or does not improve, please call us. ° °5. Wound Care °a. Remove your bandages before your first bowel movement or 8 hours after surgery.     °b. Remove any wound packing material at this tim,e as well.  You do not need to repack the wound unless instructed otherwise.  Wear an absorbent pad or soft cotton gauze in your underwear to catch any drainage and help keep the area clean. You should change this every 2-3 hours while awake. °c. Keep the area clean and dry.  Bathe / shower every day, especially after bowel movements.  Keep the area clean by showering / bathing over the incision / wound.   It is okay to soak an open wound to help wash it.  Wet wipes or showers / gentle washing after bowel movements is often less traumatic than regular toilet paper. °d. You may have some styrofoam-like soft packing in the rectum which will come out with the first bowel movement.  °e. You will often notice bleeding with bowel movements.  This should slow down by the end of the first week of surgery °f. Expect some drainage.  This should slow down, too, by the end of the first week of surgery.  Wear an absorbent pad or soft cotton gauze in your underwear until the drainage stops. °g. Do Not sit on a rubber or pillow ring.  This can make you symptoms worse.  You may sit on a soft pillow if needed.  °6. ACTIVITIES as tolerated:   °a. You may resume regular (light) daily activities beginning the next day--such as daily self-care, walking, climbing stairs--gradually increasing activities as tolerated.  If you can walk 30 minutes without difficulty, it is safe to try more intense activity such as jogging, treadmill, bicycling, low-impact aerobics, swimming, etc. °b. Save the most intensive and strenuous activity for last such as sit-ups, heavy lifting, contact sports, etc  Refrain from any heavy lifting or straining until you are off narcotics for pain  control.   °c. You may drive when you are no longer taking prescription pain medication, you can comfortably sit for long periods of time, and you can safely maneuver your car and apply brakes. °d. You may have sexual intercourse when it is comfortable.  °7. FOLLOW UP in our office °a. Please call [facility] at [phone] to set up an appointment to see your surgeon in the office for a follow-up appointment approximately 3-4 weeks after your surgery. °b. Make sure that you call for this appointment the day you arrive home to insure a convenient appointment time. °10. IF YOU HAVE DISABILITY OR FAMILY LEAVE FORMS, BRING THEM TO THE OFFICE FOR PROCESSING.  DO NOT GIVE THEM TO YOUR DOCTOR. ° ° ° ° °WHEN TO CALL US [phone]: °1. Poor pain control °2. Reactions / problems with new medications (rash/itching, nausea, etc)  °3. Fever over 101.5 F (38.5 C) °4.   Inability to urinate 5. Nausea and/or vomiting 6. Worsening swelling or bruising 7. Continued bleeding from incision. 8. Increased pain, redness, or drainage from the incision  The clinic staff is available to answer your questions during regular business hours (8:30am-5pm).  Please dont hesitate to call and ask to speak to one of our nurses for clinical concerns.   A surgeon from [facility] is always on call at the hospitals   If you have a medical emergency, go to the nearest emergency room or call 911.    [facility], [place], [place], [place], [place]  [address] ? MAIN: [phone] ? TOLL FREE: [phone] ? FAX [phone] [email or web address]   Information for Discharge Teaching: EXPAREL (bupivacaine liposome injectable suspension)   Your surgeon gave you EXPAREL(bupivacaine) in your surgical incision to help control your pain after surgery.   EXPAREL is a local anesthetic that provides pain relief by numbing the tissue around the surgical site.  EXPAREL is designed to  release pain medication over time and can control pain for up to 72 hours.  Depending on how you respond to EXPAREL, you may require less pain medication during your recovery.  Possible side effects:  Temporary loss of sensation or ability to move in the area where bupivacaine was injected.  Nausea, vomiting, constipation  Rarely, numbness and tingling in your mouth or lips, lightheadedness, or anxiety may occur.  Call your doctor right away if you think you may be experiencing any of these sensations, or if you have other questions regarding possible side effects.  Follow all other discharge instructions given to you by your surgeon or nurse. Eat a healthy diet and drink plenty of water or other fluids.  If you return to the hospital for any reason within 96 hours following the administration of EXPAREL, please inform your health care providers. Post Anesthesia Home Care Instructions  Activity: Get plenty of rest for the remainder of the day. A responsible adult should stay with you for 24 hours following the procedure.  For the next 24 hours, DO NOT: -Drive a car -Paediatric nurse -Drink alcoholic beverages -Take any medication unless instructed by your physician -Make any legal decisions or sign important papers.  Meals: Start with liquid foods such as gelatin or soup. Progress to regular foods as tolerated. Avoid greasy, spicy, heavy foods. If nausea and/or vomiting occur, drink only clear liquids until the nausea and/or vomiting subsides. Call your physician if vomiting continues.  Special Instructions/Symptoms: Your throat may feel dry or sore from the anesthesia or the breathing tube placed in your throat during surgery. If this causes discomfort, gargle with warm salt water. The discomfort should disappear within 24 hours.  If you had a scopolamine patch placed behind your ear for the management of post- operative nausea and/or vomiting:  1. The medication in the patch is  effective for 72 hours, after which it should be removed.  Wrap patch in a tissue and discard in the trash. Wash hands thoroughly with soap and water. 2. You may remove the patch earlier than 72 hours if you experience unpleasant side effects which may include dry mouth, dizziness or visual disturbances. 3. Avoid touching the patch. Wash your hands with soap and water after contact with the patch.    Post Anesthesia Home Care Instructions  Activity: Get plenty of rest for the remainder of the day. A responsible adult should stay with you for 24 hours following the procedure.  For  the next 24 hours, DO NOT: -Drive a car -Paediatric nurse -Drink alcoholic beverages -Take any medication unless instructed by your physician -Make any legal decisions or sign important papers.  Meals: Start with liquid foods such as gelatin or soup. Progress to regular foods as tolerated. Avoid greasy, spicy, heavy foods. If nausea and/or vomiting occur, drink only clear liquids until the nausea and/or vomiting subsides. Call your physician if vomiting continues.  Special Instructions/Symptoms: Your throat may feel dry or sore from the anesthesia or the breathing tube placed in your throat during surgery. If this causes discomfort, gargle with warm salt water. The discomfort should disappear within 24 hours.  If you had a scopolamine patch placed behind your ear for the management of post- operative nausea and/or vomiting:  1. The medication in the patch is effective for 72 hours, after which it should be removed.  Wrap patch in a tissue and discard in the trash. Wash hands thoroughly with soap and water. 2. You may remove the patch earlier than 72 hours if you experience unpleasant side effects which may include dry mouth, dizziness or visual disturbances. 3. Avoid touching the patch. Wash your hands with soap and water after contact with the patch.

## 2015-12-26 NOTE — Anesthesia Postprocedure Evaluation (Signed)
Anesthesia Post Note  Patient: Seth Villanueva  Procedure(s) Performed: Procedure(s) (LRB): ANAL EUA, FISTULOTOMY (N/A)  Patient location during evaluation: PACU Anesthesia Type: MAC Level of consciousness: awake Pain management: pain level controlled Vital Signs Assessment: post-procedure vital signs reviewed and stable Respiratory status: spontaneous breathing Cardiovascular status: stable Anesthetic complications: no    Last Vitals:  Filed Vitals:   12/26/15 1046 12/26/15 1100  BP:  99/58  Pulse: 96 100  Temp:    Resp: 16 14    Last Pain: There were no vitals filed for this visit.               EDWARDS,[name]

## 2015-12-26 NOTE — Op Note (Signed)
12/26/2015  10:06 AM  PATIENT:  Seth Villanueva  35 y.o. male  Patient Care Team: Leonides Sake, MD as PCP - General (Family Medicine) Kyung Rudd, MD as Consulting Physician (Radiation Oncology) Ladell Pier, MD as Consulting Physician (Oncology) [name], MD as Consulting Physician (General Surgery)  PRE-OPERATIVE DIAGNOSIS:  Anal fistula   POST-OPERATIVE DIAGNOSIS:  Anal fistula   PROCEDURE:  ANAL EUA, FISTULOTOMY   Surgeon(s): [name], MD  ASSISTANT: none   ANESTHESIA:   local and MAC  SPECIMEN:  No Specimen  DISPOSITION OF SPECIMEN:  N/A  COUNTS:  YES  PLAN OF CARE: Discharge to home after PACU  PATIENT DISPOSITION:  PACU - hemodynamically stable.  INDICATION: 35 y.o. M with a h/o coloanal anastomosis.  Now with concern for anal fistula   OR FINDINGS: shallow, R anterior anal fistula  DESCRIPTION: the patient was identified in the preoperative holding area and taken to the OR where they were laid on the operating room table.  MAC anesthesia was induced without difficulty. The patient was then positioned in prone jackknife position with buttocks gently taped apart.  The patient was then prepped and draped in usual sterile fashion.  SCDs were noted to be in place prior to the initiation of anesthesia. A surgical timeout was performed indicating the correct patient, procedure, positioning and need for preoperative antibiotics.  A rectal block was performed using Marcaine with epinephrine.    I began with a digital rectal exam.  There was significant scar from the anal canal as far proximal as I could palpate.  I then attempted to place a Hill-Ferguson anoscope into the anal canal but the small scope would not even enter.  I used an S shaped probe to identify the fistula.  Given his anal stenosis and the minimal muscle involvement of the fistula, I decided to perform a fistulotomy.  This was done with cautery.  The perianal region was injected with Marcaine  and Exparel.  The patient was then awakened and sent to the PACU in stable condition.  All counts were correct per OR staff.

## 2015-12-26 NOTE — Interval H&P Note (Signed)
History and Physical Interval Note:  12/26/2015 8:21 AM  Seth Villanueva  has presented today for surgery, with the diagnosis of Anal fistula   The various methods of treatment have been discussed with the patient and family. After consideration of risks, benefits and other options for treatment, the patient has consented to  Procedure(s): ANAL EUA POSSIBLE I&D POSSIBLE SETON POSSIBLE  FISTULOTOMY (N/A) PLACEMENT OF SETON (N/A) as a surgical intervention .  The patient's history has been reviewed, patient examined, no change in status, stable for surgery.  I have reviewed the patient's chart and labs.  Questions were answered to the patient's satisfaction.     [name], MD  [facility]

## 2015-12-26 NOTE — Telephone Encounter (Signed)
Message from pt and pt's mother requesting refill on Lorazepam. Reviewed with Dr. Benay Spice: Order received, schedule follow up in 2 weeks. Rx called to pharmacy, order to schedulers. Spoke with pt, instructed him to take one daily as needed. He voiced understanding.

## 2015-12-26 NOTE — Telephone Encounter (Signed)
cld & spoke to pt and gave pt time & date for appt for 6/2 @11 :45

## 2015-12-26 NOTE — Anesthesia Preprocedure Evaluation (Addendum)
Anesthesia Evaluation  Patient identified by MRN, date of birth, ID band Patient awake    Reviewed: Allergy & Precautions, NPO status   Airway Mallampati: II  TM Distance: >3 FB Neck ROM: Full    Dental   Pulmonary former smoker,    breath sounds clear to auscultation       Cardiovascular negative cardio ROS   Rhythm:Regular Rate:Normal     Neuro/Psych    GI/Hepatic negative GI ROS, Neg liver ROS,   Endo/Other  negative endocrine ROS  Renal/GU negative Renal ROS     Musculoskeletal   Abdominal   Peds  Hematology   Anesthesia Other Findings   Reproductive/Obstetrics                            Anesthesia Physical Anesthesia Plan  ASA: II  Anesthesia Plan: General   Post-op Pain Management:    Induction: Intravenous  Airway Management Planned: LMA  Additional Equipment:   Intra-op Plan:   Post-operative Plan: Extubation in OR  Informed Consent: I have reviewed the patients History and Physical, chart, labs and discussed the procedure including the risks, benefits and alternatives for the proposed anesthesia with the patient or authorized representative who has indicated his/her understanding and acceptance.   Dental advisory given  Plan Discussed with: CRNA and Anesthesiologist  Anesthesia Plan Comments:         Anesthesia Quick Evaluation

## 2015-12-26 NOTE — Transfer of Care (Signed)
Last Vitals:  Filed Vitals:   12/26/15 0757  BP: 128/74  Pulse: 115  Temp: 37.2 C  Resp: 16    Last Pain: There were no vitals filed for this visit.    Patients Stated Pain Goal: 6 (12/26/15 0816)

## 2015-12-26 NOTE — Telephone Encounter (Signed)
Received call from mother Coralyn Mark stating that pt is at day surgery for fistula repaired.  Pt requested refill of Lorazepam. [facility]   Phone      [phone].

## 2015-12-26 NOTE — Anesthesia Procedure Notes (Signed)
Procedure Name: MAC Date/Time: 12/26/2015 9:41 AM Performed by: Mechele Claude Pre-anesthesia Checklist: Patient identified, Emergency Drugs available, Suction available, Patient being monitored and Timeout performed Oxygen Delivery Method: Nasal cannula Placement Confirmation: positive ETCO2 and breath sounds checked- equal and bilateral

## 2015-12-26 NOTE — Transfer of Care (Signed)
  Last Vitals:  Filed Vitals:   12/26/15 0757  BP: 128/74  Pulse: 115  Temp: 37.2 C  Resp: 16    Last Pain: There were no vitals filed for this visit.    Patients Stated Pain Goal: 6 (12/26/15 [id number]) Immediate Anesthesia Transfer of Care Note  Patient: Seth Villanueva  Procedure(s) Performed: Procedure(s) (LRB): ANAL EUA, FISTULOTOMY (N/A)  Patient Location: PACU  Anesthesia Type: General  Level of Consciousness: awake, alert  and oriented  Airway & Oxygen Therapy: Patient Spontanous Breathing and Patient connected to nasal cannula oxygen  Post-op Assessment: Report given to PACU RN and Post -op Vital signs reviewed and stable  Post vital signs: Reviewed and stable  Complications: No apparent anesthesia complications

## 2015-12-26 NOTE — H&P (View-Only) (Signed)
History of Present Illness Seth Ruff MD; 123456 11:06 AM) Patient words: anal pain and swelling.  The patient is a 35 year old male presenting for a post-operative visit. Patient is status post ileostomy reversal after low anterior resection and diverting ileostomy in early Dec 2016. We had some difficulty with anastomotic leak and stricture after his original surgery. This required 2 different procedures to obtain patency of his anastomosis. After these healed we reversed his ileostomy. He was recently saw for an anal fissure. At that time his ileostomy site appeared to be healing well and silver nitrate was applied to an area of granulation tissue. He continues to have trouble perirectally. He completed his course of antibiotics approximately 2 days ago. After that he developed low-grade fevers and worsening pain. He has experienced some bloody drainage from his perianal region as well.   Problem List/Past Medical Seth Ruff, MD; 123456 11:06 AM) POST-OPERATIVE STATE [phone]) ACUTE ANAL FISSURE (K60.0) PRIMARY CANCER OF RECTUM (C20) ANAL ABSCESS (K61.0) ANAL STRICTURE (K62.4) OPEN WOUND OF LATERAL ABDOMINAL WALL, SEQUELA (S31.109S)  Other Problems Seth Ruff, MD; 123456 11:06 AM) Rectal Cancer Hemorrhoids  Past Surgical History Seth Ruff, MD; 123456 11:06 AM) Colon Removal - Partial RECTAL EUA [id number]) 11/15/2014  Diagnostic Studies History Seth Ruff, MD; 123456 11:06 AM) Colonoscopy within last year  Allergies Mammie Lorenzo, LPN; 624THL 624THL AM) Azithromycin *CHEMICALS* Hives. Compazine *ANTIPSYCHOTICS/ANTIMANIC AGENTS* anxiety  Medication History Seth Ruff, MD; 123456 11:06 AM) Medications Reconciled Cipro (500MG  Tablet, 1 (one) Tablet Oral two times daily, Taken starting 12/16/2015) Active. Flagyl (500MG  Tablet, 2 (two) Oral bid, Taken starting 12/16/2015) Active. Lidocaine (5% Ointment, 1 application  External three times daily, as needed, Taken starting 08/30/2015) Active. Tylenol (325MG  Capsule, Oral) Active. Ibuprofen (200MG  Capsule, Oral prn) Active. Imodium A-D (2MG  Tablet, Oral prn) Active. Mylicon (40MG /0.6ML Suspension, Oral prn) Active. Sudafed (30MG  Tablet, Oral prn) Active. Acetaminophen (325MG  Tablet, Oral prn) Active.  Social History Seth Ruff, MD; 123456 11:06 AM) Tobacco use Former smoker. No caffeine use No drug use Alcohol use Occasional alcohol use.  Family History Seth Ruff, MD; 123456 11:06 AM) Heart disease in male family member before age 44 Hypertension Family Members In General. Thyroid problems Family Members In General. Breast Cancer Family Members In General. Arthritis Family Members In General. Heart Disease Family Members In General. Heart disease in male family member before age 63     Review of Systems Seth Ruff MD; 123456 11:07 AM) General Not Present- Appetite Loss, Chills, Fatigue, Fever, Night Sweats, Weight Gain and Weight Loss. Skin Not Present- Change in Wart/Mole, Dryness, Hives, Jaundice, New Lesions, Non-Healing Wounds, Rash and Ulcer. HEENT Not Present- Earache, Hearing Loss, Hoarseness, Nose Bleed, Oral Ulcers, Ringing in the Ears, Seasonal Allergies, Sinus Pain, Sore Throat, Visual Disturbances, Wears glasses/contact lenses and Yellow Eyes. Respiratory Not Present- Bloody sputum, Chronic Cough, Difficulty Breathing, Snoring and Wheezing. Breast Not Present- Breast Mass, Breast Pain, Nipple Discharge and Skin Changes. Cardiovascular Not Present- Chest Pain, Difficulty Breathing Lying Down, Leg Cramps, Palpitations, Rapid Heart Rate, Shortness of Breath and Swelling of Extremities. Gastrointestinal Not Present- Abdominal Pain, Bloody Stool, Change in Bowel Habits, Chronic diarrhea, Constipation, Difficulty Swallowing, Excessive gas, Gets full quickly at meals, Indigestion, Nausea, Rectal Pain and  Vomiting. Male Genitourinary Not Present- Blood in Urine, Change in Urinary Stream, Frequency, Impotence, Nocturia, Painful Urination, Urgency and Urine Leakage. Musculoskeletal Not Present- Back Pain, Joint Pain, Joint Stiffness, Muscle Pain, Muscle Weakness and Swelling of Extremities. Neurological Not Present- Decreased Memory, Fainting,  Headaches, Numbness, Seizures, Tingling, Tremor, Trouble walking and Weakness. Psychiatric Not Present- Anxiety, Bipolar, Change in Sleep Pattern, Depression, Fearful and Frequent crying. Endocrine Not Present- Cold Intolerance, Excessive Hunger, Hair Changes, Heat Intolerance and New Diabetes. Hematology Not Present- Easy Bruising, Excessive bleeding, Gland problems, HIV and Persistent Infections.  Vitals Claiborne Billings Dockery LPN; 624THL 624THL AM) 12/16/2015 10:15 AM Weight: 99.1 lb Height: 74in Body Surface Area: 1.61 m Body Mass Index: 12.72 kg/m  Temp.: 99.79F(Oral)  Pulse: 120 (Regular)  BP: 142/82 (Sitting, Left Arm, Standard)      Physical Exam Seth Ruff MD; 123456 11:07 AM)  General Mental Status-Alert. General Appearance-Cooperative, Lethargic / slow, Not in acute distress. Build & Nutrition-Well nourished. Posture-Normal posture. Gait-Normal.  Head and Neck Head-normocephalic, atraumatic with no lesions or palpable masses.  Chest and Lung Exam Chest and lung exam reveals -on auscultation, normal breath sounds, no adventitious sounds and normal vocal resonance.  Cardiovascular Cardiovascular examination reveals -normal heart sounds, regular rate and rhythm with no murmurs.  Abdomen Inspection Skin - Scar - Note: ileostomy site with one area in the lateral aspect of the wound that has not completely healed. This is approximately 46mm in depth and 61mm wide. No signs of infection noted. . Palpation/Percussion Palpation and Percussion of the abdomen reveal - Soft. Note: mildly  distended.  Rectal Anorectal Exam Internal - Note: Anastomosis patent. Slight stricture palpated, no obvious masses. Unable to perform more proximal exam due to pain.    Assessment & Plan Seth Ruff MD; 123456 10:34 AM)  ANAL ABSCESS (K61.0) Impression: 35 year old male with history of coloanal anastomosis who presents with low-grade fevers and anal discomfort as well as purulent drainage. On exam today he has a draining abscess in the anterior portion of his anal canal. Given his complex surgical history, I recommended an anal exam under anesthesia for further evaluation.

## 2015-12-27 ENCOUNTER — Encounter (HOSPITAL_BASED_OUTPATIENT_CLINIC_OR_DEPARTMENT_OTHER): Payer: Self-pay | Admitting: General Surgery

## 2016-01-05 ENCOUNTER — Telehealth: Payer: Self-pay | Admitting: Surgery

## 2016-01-05 NOTE — Telephone Encounter (Signed)
[facility]  September 28, 1980 [id number]  Patient Care Team: Leonides Sake, MD as PCP - General (Family Medicine) Kyung Rudd, MD as Consulting Physician (Radiation Oncology) Ladell Pier, MD as Consulting Physician (Oncology) [name], MD as Consulting Physician (General Surgery)  This patient is a 35 y.o.male who calls today for surgical evaluation.   Date of procedure/visit: 12/26/2015  Surgery: Examination under anesthesia.  Anal fistulotomy.  Reason for call: Blood in urine.  Rectal pain and diarrhea.  Patient is 10 days status post examination under anesthesia with fistulotomy.  Concerned that he is having "a lot of diarrhea.  Sounds like about at least 10 times a day.  Has taken one or two Imodium's.  Nothing else.  No nausea or vomiting.  Has no some blood with it as well.  Then noticed some blood when he urinated this morning.  He was concerned.  No burning.  No history of skin infections passed.  After reviewing his chart, patient has a much more complicated history of rectal cancer with low anastomosis and ileal diversion.  Some breakdown.  Rather malnourished.  I strongly recommend he control his diarrhea.  Increase Imodium.  Can take up to eight pills a day.  Patient was on another antidiarrheal.  Sounds like Lomotil.  He still had some pills.  I noted he continues that as well if Imodium is not enough.  Recommended a fiber supplement to help absorb liquids and solidify stools.  Need to get his diarrhea under control first.  Some rectal bleeding and drainage as expected with an anal fistulotomy in the wound.  Should be manageable over time.  Suspect some blood in the urine may be related to prostatitis from all the diarrhea.  If diarrhea controlled then that should improve as well.  Should he not improve for actually worsen, may need evaluation.  Emergency room only option until the holiday weekend is over.  Questions answered.  Went over a many times.  He expressed  understanding and appreciation.  Patient Active Problem List   Diagnosis Date Noted  . Protein-calorie malnutrition, severe 11/24/2015  . Dehiscence of intestinal anastomosis 11/23/2015  . Chemotherapy-induced neuropathy ([place]) 11/22/2015  . Fever 11/22/2015  . Diarrhea 11/22/2015  . Unintentional weight change 11/22/2015  . Weakness 11/22/2015  . Nausea without vomiting 11/22/2015  . Hypoalbuminemia due to protein-calorie malnutrition ([place]) 11/22/2015  . 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 Iron deficiency anemia 11/22/2015  . Acute blood loss anemia 11/22/2015  . Genetic testing 06/26/2015  . Small bowel obstruction ([place]) 04/22/2015  . 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 Rectal cancer ([place]) 02/01/2014    Past Medical History  Diagnosis Date  . S/P radiation therapy 02/19/14-03/28/14    rectal  50.4Gy  . History of cancer chemotherapy     completed 09-03-2014  . Chemotherapy-induced neuropathy ([place])   . Wears glasses   . History of phlebitis     08-20-2014---- RIGHT UPPER INNER ARM--  RESOLVED   . Incomplete right bundle branch block   . Numbness     feet bilat   . Dehisced intestinal anastomosis     rectal  region --  Admitted 11-26-2015  . Anal abscess   . Anemia in neoplastic disease   . Iron deficiency   . Hypoalbuminemia due to protein-calorie malnutrition ([place])   . History of ileostomy ([place])     takedown 07-12-2015  . Chronic diarrhea  NEGATIVE C-DIFF 11-22-2015  . Chronic nausea   . Rectal cancer [facility]) onocologist-  dr Benay Spice    dx 01-31-2015--  Stage III (ypT2, ypN2) invasive--  s/p  radiation (ended 03-28-2014) and chemo (complete 09-03-2014) and low anterior colon resection with ileostomy 05-18-2014    Past Surgical History  Procedure Laterality Date  . Wisdom tooth extraction  age 7  . Eus N/A 01/30/2014    Procedure: LOWER ENDOSCOPIC ULTRASOUND (EUS);  Surgeon: Arta Silence, MD;   Location: [facility] ENDOSCOPY;  Service: Endoscopy;  Laterality: N/A;  with biospy's  . Colonoscopy w/ biopsies  01/22/2014    fungating nonobstructing large mass distal rectum  . Portacath placement Left 05/31/2014    Procedure: INSERTION PORT-A-CATH LET SUBCLAVIAN;  Surgeon: [name], MD;  Location: [facility] ORS;  Service: General;  Laterality: Left;  . Robot assisted low anterior colon resection with flexure mobilization /  hand sewn colo-anal anastomiosis/  diverting loop ileostomy  05-18-2014  . Rectal exam under anesthesia N/A 11/15/2014    Procedure: ANAL EXAM UNDER ANESTHESIA, RIGID PROTOSCOPY;  Surgeon: [name], MD;  Location: [place];  Service: General;  Laterality: N/A;  . Flexible sigmoidoscopy N/A 11/15/2014    Procedure: FLEXIBLE SIGMOIDOSCOPY;  Surgeon: [name], MD;  Location: [facility];  Service: General;  Laterality: N/A;  . Repair of rectal prolapse N/A 04/18/2015    Procedure: RETROGRADE COLONOSCOPY AND DILATION OF ANAL STRICTURE;  Surgeon: [name], MD;  Location: [facility] ORS;  Service: General;  Laterality: N/A;  . Ileostomy closure N/A 07/12/2015    Procedure: ILEOSTOMY REVERSAL;  Surgeon: [name], MD;  Location: [facility];  Service: General;  Laterality: N/A;  . Laparoscopy N/A 07/12/2015    Procedure: LAPAROSCOPY DIAGNOSTIC;  Surgeon: [name], MD;  Location: [facility];  Service: General;  Laterality: N/A;  . Anal fistulotomy N/A 12/26/2015    Procedure: ANAL EUA, FISTULOTOMY;  Surgeon: [name], MD;  Location: [place];  Service: General;  Laterality: N/A;    Social History   Social History  . Marital Status: Single    Spouse Name: N/A  . Number of Children: N/A  . Years of Education: N/A   Occupational History  . TECHNICIAN      [employer]   Social History Main Topics  . Smoking status: Former Smoker -- 0.25 packs/day for 5 years    Types: Cigarettes    Quit date: 08/11/2007  . Smokeless tobacco:  Former Systems developer    Types: Snuff, Chew    Quit date: 02/01/2006  . Alcohol Use: No  . Drug Use: No  . Sexual Activity: Not on file   Other Topics Concern  . 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 Breast cancer Maternal Aunt     pat mat great aunt; currently 18  . Leukemia Maternal Aunt     pat mat great aunt; deceased 50s  . Other Paternal Aunt     colon polyps (type/#?)    Current Outpatient Prescriptions  Medication Sig Dispense Refill  . acetaminophen (TYLENOL) 325 MG tablet Take 325 mg by mouth once as needed for mild pain (pain). Reported on 07/29/2015    . bismuth subsalicylate (PEPTO BISMOL) 262 MG/15ML suspension Take 30 mLs by mouth daily as needed for diarrhea or loose stools.    . cetirizine (ZYRTEC) 10  MG tablet Take 10 mg by mouth daily as needed for allergies.    Marland Kitchen LORazepam (ATIVAN) 0.5 MG tablet Take 1 tablet (0.5 mg total) by mouth daily as needed for anxiety. 30 tablet 0  . ondansetron (ZOFRAN) 4 MG tablet Take 1 tablet (4 mg total) by mouth every 8 (eight) hours as needed for nausea or vomiting. 40 tablet 1  . Ondansetron HCl (ZOFRAN PO) Take 1 tablet by mouth every 6 (six) hours as needed (nausea and vomitting.).    Marland Kitchen oxyCODONE (OXY IR/ROXICODONE) 5 MG immediate release tablet Take 1 tablet (5 mg total) by mouth every 6 (six) hours as needed for moderate pain. 30 tablet 0  . simethicone (MYLICON) 80 MG chewable tablet Chew 80 mg by mouth every 6 (six) hours as needed for flatulence.     No current facility-administered medications for this visit.   Facility-Administered Medications Ordered in Other Visits  Medication Dose Route Frequency Provider Last Rate Last Dose  . heparin lock flush 100 unit/mL  500 Units Intravenous Once Ladell Pier, MD   500 Units at 06/07/15 1009  . sodium chloride 0.9 % injection 10 mL  10 mL Intravenous PRN Ladell Pier, MD          Allergies  Allergen Reactions  . Zithromax [Azithromycin] Hives  . Compazine [Prochlorperazine] Anxiety    @VS @  No results found.  Note: This dictation was prepared with Dragon/digital dictation along with [employer]. Any transcriptional errors that result from this process are unintentional.

## 2016-01-08 ENCOUNTER — Telehealth: Payer: Self-pay | Admitting: *Deleted

## 2016-01-08 NOTE — Telephone Encounter (Signed)
He should see Dr. Marcello Moores or go to ER

## 2016-01-08 NOTE — Telephone Encounter (Signed)
Message from pt's mother reporting his temp is elevated at 102.6, requesting call back. Returned call, she reports they have contacted surgeon's office; they were told to take stool specimen to [facility] lab for C-Diff testing. Mrs. Masih reports low grade fever began a few days ago spiked to 102 last night.  She asked at what temp would he go to ED? Informed her we generally send pts to ED if temp is above 101 and they've had a recent surgical intervention.  She reports pt is taking Lomotil and Imodium, stools have been more formed lately. Informed her that he should hold anti-diarrheals for C-Diff test. She plans to follow up with surgeon's office. Informed her that Dr. Benay Spice is out of the office this afternoon, will make him aware of this call when he returns 6/1. Reviewed above conversation with Ned Card, NP: Recommend pt go to ED if he's not being evaluated by MD.   Returned call to Mrs. Dyar, recommended pt go to ED. She stated they were taking him to do the stool specimen, would rather not go to ED after speaking with surgeon's office. "His case is so complicated, we really don't want to take him to the emergency room where they don't know him." They are using ice packs to get temp below 102. Family wishes to keep him out of the hospital if possible.

## 2016-01-08 NOTE — Telephone Encounter (Signed)
Returned call to Mrs. Foucher: Informed her that Dr. Benay Spice recommends pt be seen by MD or go to ED. She voiced understanding, stated they spoke with Dr. Marcello Moores who recommends they wait for C-Diff result.  Strongly recommended they go to ED for evaluation. Mrs. Egnew reports pt is at the lab now. She stated his temp went down 101.3 with Tylenol and ice. She stated she will check on him when he gets back from lab and will take him to ED if he doesn't feel any better.  She went on to say she doesn't want the surgeon to be upset if they take him to ED. Suggested they take him in, the surgeon in the hospital can access his records. She voiced understanding.

## 2016-01-09 ENCOUNTER — Telehealth: Payer: Self-pay

## 2016-01-09 NOTE — Telephone Encounter (Signed)
Called to follow up with patient, states he is feeling better today. States he checked his temp this morning and it was 99.1. States he has not received results from his C-DIFF testing done yesterday. Informed him to call if he started feeling bad again and/or if temp spiked again. Pt verbalized understanding.  Called [place] lab, spoke with Wells Guiles- states C-DIFF result is still pending, should be back by 01/10/16 in the AM.  Informed Ned Card, NP.

## 2016-01-10 ENCOUNTER — Telehealth: Payer: Self-pay | Admitting: *Deleted

## 2016-01-10 ENCOUNTER — Ambulatory Visit: Payer: 59 | Admitting: Nurse Practitioner

## 2016-01-10 NOTE — Telephone Encounter (Signed)
Called pt to inquire about missed appt w/ Ned Card, NP. No answer but left a detailed message to VM. Asked pt to call this nurse back so we can r/s him for this appt, also told him I wanted to make sure he was okay. Pt can call this nurse back at [phone]. Message to be fwd to [employer].

## 2016-01-17 ENCOUNTER — Telehealth: Payer: Self-pay

## 2016-01-17 NOTE — Telephone Encounter (Signed)
Called to follow up with patient d/t missed appt. Patient states he was unaware of appt and apologized. Informed patient that we just wanted to check on him to see how he was feeling and informed him of appt on 6/20 at 215. Pt verbalized understanding and states he feels great, will call interm if any problems.

## 2016-01-24 ENCOUNTER — Telehealth: Payer: Self-pay | Admitting: *Deleted

## 2016-01-24 NOTE — Telephone Encounter (Signed)
Message from pt reporting "pelvic floor inflammation" and difficulty emptying bladder. Has urinary urgency and hesitancy. Requesting to be seen in office today. Reviewed with Dr. Benay Spice: Pt should see surgeon for this. Returned call to pt, he agrees to contact Dr. Marcello Moores.

## 2016-01-27 ENCOUNTER — Other Ambulatory Visit: Payer: 59

## 2016-01-27 ENCOUNTER — Ambulatory Visit: Payer: 59 | Admitting: Oncology

## 2016-01-28 ENCOUNTER — Telehealth: Payer: Self-pay | Admitting: Oncology

## 2016-01-28 ENCOUNTER — Other Ambulatory Visit: Payer: 59

## 2016-01-28 ENCOUNTER — Ambulatory Visit (HOSPITAL_BASED_OUTPATIENT_CLINIC_OR_DEPARTMENT_OTHER): Payer: 59 | Admitting: Oncology

## 2016-01-28 ENCOUNTER — Ambulatory Visit (HOSPITAL_BASED_OUTPATIENT_CLINIC_OR_DEPARTMENT_OTHER): Payer: 59

## 2016-01-28 VITALS — BP 133/83 | HR 104 | Temp 99.1°F | Resp 20 | Ht 74.0 in | Wt 142.5 lb

## 2016-01-28 DIAGNOSIS — Z85048 Personal history of other malignant neoplasm of rectum, rectosigmoid junction, and anus: Secondary | ICD-10-CM

## 2016-01-28 DIAGNOSIS — Z95828 Presence of other vascular implants and grafts: Secondary | ICD-10-CM

## 2016-01-28 DIAGNOSIS — R197 Diarrhea, unspecified: Secondary | ICD-10-CM

## 2016-01-28 DIAGNOSIS — C20 Malignant neoplasm of rectum: Secondary | ICD-10-CM

## 2016-01-28 DIAGNOSIS — R509 Fever, unspecified: Secondary | ICD-10-CM | POA: Diagnosis not present

## 2016-01-28 MED ORDER — SODIUM CHLORIDE 0.9% FLUSH
10.0000 mL | INTRAVENOUS | Status: DC | PRN
  Administered 2016-01-28: 10 mL via INTRAVENOUS
  Filled 2016-01-28: qty 10

## 2016-01-28 MED ORDER — HEPARIN SOD (PORK) LOCK FLUSH 100 UNIT/ML IV SOLN
500.0000 [IU] | Freq: Once | INTRAVENOUS | Status: AC
  Administered 2016-01-28: 500 [IU] via INTRAVENOUS
  Filled 2016-01-28: qty 5

## 2016-01-28 NOTE — Patient Instructions (Signed)

## 2016-01-28 NOTE — Progress Notes (Signed)
[place] OFFICE PROGRESS NOTE   Diagnosis: Rectal cancer  INTERVAL HISTORY:   Mr. Pokorski underwent an examination under anesthesia and fistulotomy by Dr. Marcello Moores on 12/26/2015. 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  He reports marked improvement in rectal pain following the procedure. He is no longer taking pain medication. He has not taken oxycodone for the past one to 2 weeks.  He has been taking tumeric. He believes this is causing diarrhea. He developed diarrhea beginning 4-5 days ago with associated fever. He had a fever up to 102 yesterday. He continues to have diarrhea today despite Imodium.  He reports scrotal swelling when he began the tumeric. This has resolved.  Mr. Bultman now has a good appetite.  Objective:  Vital signs in last 24 hours:  Blood pressure 133/83, pulse 104, temperature 99.1 F (37.3 C), temperature source Oral, resp. rate 20, height '6\' 2"'  (1.88 m), weight 142 lb 8 oz (64.638 kg), SpO2 100 %.    HEENT: Neck without mass Lymphatics: No cervical, supraclavicular, axillary, or right inguinal nodes. "Shotty "left inguinal node Resp: Lungs clear bilaterally Cardio: Regular rate and rhythm GI: No hepatomegaly, no mass, nontender Vascular: No leg edema GU: No scrotal edema    Portacath/PICC-without erythema  Lab Results:  Lab Results  Component Value Date   WBC 7.9 12/20/2015   HGB 11.8* 12/20/2015   HCT 37.5* 12/20/2015   MCV 77.9* 12/20/2015   PLT 468* 12/20/2015   NEUTROABS 5.1 12/20/2015     Medications: I have reviewed the patient's current medications.  Assessment/Plan: 1. Rectal cancer, clinical stage III, distal rectal mass-approximate 2 cm from the anal verge, status post an endoscopic biopsy 01/30/2014 confirming an invasive adenocarcinoma, microsatellite stable  CTs of the chest, abdomen, and pelvis with no evidence of metastatic disease, malignant-appearing perirectal lymph  nodes on the abdomen/pelvis CT 01/29/2014   EUS 01/30/2014 confirmed a uT3,uN2 lesion   Initiation of radiation and concurrent Xeloda 02/19/2014. Completion of radiation and concurrent Xeloda 03/28/2014.  Low anterior resection, diverting loop ileostomy, and coloanal anastomosis 05/18/2014,ypT2,ypN2, no loss of mismatch repair protein expression  Cycle 1 adjuvant FOLFOX 06/11/2014  Cycle 2 adjuvant FOLFOX 06/25/2014  Cycle 3 adjuvant FOLFOX 07/09/2014  Cycle 4 adjuvant FOLFOX 07/23/2014  Cycle 5 adjuvant FOLFOX 08/06/2014  Cycle 6 adjuvant FOLFOX 08/20/2014  Cycle 7 adjuvant FOLFOX 09/03/2014  Cycle 8 adjuvant FOLFOX 09/17/2014-oxaliplatin held  Cycle 9 adjuvant FOLFOX 10/01/2014-oxaliplatin held  CT scans chest/abdomen/pelvis 01/31/2015 with no evidence of recurrence. No liver metastatic disease or pathologic adenopathy identified.  CT abdomen/pelvis 11/22/2015-no evidence of recurrent rectal cancer 2. History of rectal pain secondary to #1. Improved. 3. Phlebitis right upper inner arm 08/20/2014. Resolved. 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 6. Ileostomy takedown procedure 07/12/2015. 7. Admission with a fever 11/22/2015-CT with evidence of dehiscence at the rectal anastomosis 8. Intermittent fever 9. Diarrhea-stool negative for C. difficile 11/22/2015   Disposition:  Seth Villanueva remains in clinical remission from rectal cancer. He continues to have intermittent fever and diarrhea. He will seek medical attention if fever and diarrhea do not improve over the next few days. He continues follow-up with Dr. Marcello Moores for evaluation and management of the rectal anastomosis.  We will follow-up on the CEA from today. He will return for an office visit and Port-A-Cath flush in 6 weeks. We will arrange for repeat C. difficile toxin testing for persistent diarrhea.  [place],  Dominica Severin, MD  01/28/2016  3:28  PM

## 2016-01-28 NOTE — Telephone Encounter (Signed)
Gave and printed appt sched and avs for pt for July  °

## 2016-01-29 LAB — CEA (PARALLEL TESTING): CEA: <0.5 ng/mL

## 2016-01-30 ENCOUNTER — Telehealth: Payer: Self-pay | Admitting: General Surgery

## 2016-01-31 ENCOUNTER — Inpatient Hospital Stay (HOSPITAL_COMMUNITY)
Admission: EM | Admit: 2016-01-31 | Discharge: 2016-02-12 | DRG: 907 | Disposition: A | Discharge status: Home Health | Payer: 59 | Attending: General Surgery | Admitting: General Surgery

## 2016-01-31 ENCOUNTER — Telehealth: Payer: Self-pay | Admitting: *Deleted

## 2016-01-31 ENCOUNTER — Encounter (HOSPITAL_COMMUNITY): Payer: Self-pay | Admitting: Emergency Medicine

## 2016-01-31 ENCOUNTER — Emergency Department (HOSPITAL_COMMUNITY): Payer: 59

## 2016-01-31 DIAGNOSIS — M869 Osteomyelitis, unspecified: Secondary | ICD-10-CM | POA: Diagnosis present

## 2016-01-31 DIAGNOSIS — Z9221 Personal history of antineoplastic chemotherapy: Secondary | ICD-10-CM

## 2016-01-31 DIAGNOSIS — R197 Diarrhea, unspecified: Secondary | ICD-10-CM | POA: Diagnosis present

## 2016-01-31 DIAGNOSIS — Z419 Encounter for procedure for purposes other than remedying health state, unspecified: Secondary | ICD-10-CM

## 2016-01-31 DIAGNOSIS — T8132XA Disruption of internal operation (surgical) wound, not elsewhere classified, initial encounter: Secondary | ICD-10-CM | POA: Diagnosis present

## 2016-01-31 DIAGNOSIS — K651 Peritoneal abscess: Secondary | ICD-10-CM | POA: Diagnosis present

## 2016-01-31 DIAGNOSIS — Z87891 Personal history of nicotine dependence: Secondary | ICD-10-CM

## 2016-01-31 DIAGNOSIS — Z85048 Personal history of other malignant neoplasm of rectum, rectosigmoid junction, and anus: Secondary | ICD-10-CM

## 2016-01-31 DIAGNOSIS — F419 Anxiety disorder, unspecified: Secondary | ICD-10-CM | POA: Diagnosis present

## 2016-01-31 DIAGNOSIS — Y832 Surgical operation with anastomosis, bypass or graft as the cause of abnormal reaction of the patient, or of later complication, without mention of misadventure at the time of the procedure: Secondary | ICD-10-CM | POA: Diagnosis not present

## 2016-01-31 DIAGNOSIS — K567 Ileus, unspecified: Secondary | ICD-10-CM | POA: Diagnosis not present

## 2016-01-31 DIAGNOSIS — K5900 Constipation, unspecified: Secondary | ICD-10-CM | POA: Diagnosis present

## 2016-01-31 DIAGNOSIS — N9972 Accidental puncture and laceration of a genitourinary system organ or structure during other procedure: Secondary | ICD-10-CM | POA: Diagnosis not present

## 2016-01-31 DIAGNOSIS — D638 Anemia in other chronic diseases classified elsewhere: Secondary | ICD-10-CM | POA: Diagnosis present

## 2016-01-31 DIAGNOSIS — Z923 Personal history of irradiation: Secondary | ICD-10-CM

## 2016-01-31 DIAGNOSIS — R509 Fever, unspecified: Secondary | ICD-10-CM | POA: Diagnosis present

## 2016-01-31 LAB — CBC WITH DIFFERENTIAL/PLATELET
BASOS PCT: 0 %
Basophils Absolute: 0 10*3/uL (ref 0.0–0.1)
EOS ABS: 0.1 10*3/uL (ref 0.0–0.7)
EOS PCT: 1 %
HCT: 27 % — ABNORMAL LOW (ref 39.0–52.0)
HEMOGLOBIN: 8.5 g/dL — AB (ref 13.0–17.0)
Lymphocytes Relative: 15 %
Lymphs Abs: 1.2 10*3/uL (ref 0.7–4.0)
MCH: 23.4 pg — ABNORMAL LOW (ref 26.0–34.0)
MCHC: 31.5 g/dL (ref 30.0–36.0)
MCV: 74.2 fL — ABNORMAL LOW (ref 78.0–100.0)
Monocytes Absolute: 1.3 10*3/uL — ABNORMAL HIGH (ref 0.1–1.0)
Monocytes Relative: 16 %
NEUTROS ABS: 5.5 10*3/uL (ref 1.7–7.7)
NEUTROS PCT: 68 %
PLATELETS: 383 10*3/uL (ref 150–400)
RBC: 3.64 MIL/uL — AB (ref 4.22–5.81)
RDW: 18.9 % — ABNORMAL HIGH (ref 11.5–15.5)
WBC: 8.1 10*3/uL (ref 4.0–10.5)

## 2016-01-31 LAB — BASIC METABOLIC PANEL
Anion gap: 8 (ref 5–15)
BUN: 10 mg/dL (ref 6–20)
CHLORIDE: 98 mmol/L — AB (ref 101–111)
CO2: 25 mmol/L (ref 22–32)
CREATININE: 0.61 mg/dL (ref 0.61–1.24)
Calcium: 7.9 mg/dL — ABNORMAL LOW (ref 8.9–10.3)
Glucose, Bld: 97 mg/dL (ref 65–99)
POTASSIUM: 3.7 mmol/L (ref 3.5–5.1)
SODIUM: 131 mmol/L — AB (ref 135–145)

## 2016-01-31 LAB — URINALYSIS, ROUTINE W REFLEX MICROSCOPIC
BILIRUBIN URINE: NEGATIVE
Glucose, UA: NEGATIVE mg/dL
HGB URINE DIPSTICK: NEGATIVE
Ketones, ur: NEGATIVE mg/dL
Leukocytes, UA: NEGATIVE
NITRITE: NEGATIVE
PROTEIN: NEGATIVE mg/dL
SPECIFIC GRAVITY, URINE: 1.004 — AB (ref 1.005–1.030)
pH: 7 (ref 5.0–8.0)

## 2016-01-31 LAB — I-STAT CG4 LACTIC ACID, ED: Lactic Acid, Venous: 0.82 mmol/L (ref 0.5–2.0)

## 2016-01-31 MED ORDER — VANCOMYCIN HCL IN DEXTROSE 1-5 GM/200ML-% IV SOLN
1000.0000 mg | Freq: Three times a day (TID) | INTRAVENOUS | Status: DC
  Administered 2016-01-31 – 2016-02-04 (×12): 1000 mg via INTRAVENOUS
  Filled 2016-01-31 (×12): qty 200

## 2016-01-31 MED ORDER — IOPAMIDOL (ISOVUE-300) INJECTION 61%
100.0000 mL | Freq: Once | INTRAVENOUS | Status: AC | PRN
  Administered 2016-01-31: 100 mL via INTRAVENOUS

## 2016-01-31 MED ORDER — SODIUM CHLORIDE 0.9 % IV BOLUS (SEPSIS): 1000.0000 mL | Freq: Once | INTRAVENOUS | Status: DC

## 2016-01-31 MED ORDER — KCL IN DEXTROSE-NACL 20-5-0.9 MEQ/L-%-% IV SOLN
INTRAVENOUS | Status: DC
  Administered 2016-01-31 – 2016-02-04 (×6): via INTRAVENOUS
  Filled 2016-01-31 (×11): qty 1000

## 2016-01-31 MED ORDER — ACETAMINOPHEN 325 MG PO TABS: 650.0000 mg | ORAL_TABLET | Freq: Once | ORAL | Status: DC

## 2016-01-31 MED ORDER — ENOXAPARIN SODIUM 40 MG/0.4ML ~~LOC~~ SOLN
40.0000 mg | SUBCUTANEOUS | Status: DC
  Administered 2016-01-31 – 2016-02-11 (×10): 40 mg via SUBCUTANEOUS
  Filled 2016-01-31 (×11): qty 0.4

## 2016-01-31 MED ORDER — ACETAMINOPHEN 650 MG RE SUPP: 650.0000 mg | Freq: Four times a day (QID) | RECTAL | Status: DC | PRN

## 2016-01-31 MED ORDER — PIPERACILLIN-TAZOBACTAM 3.375 G IVPB
3.3750 g | Freq: Once | INTRAVENOUS | Status: AC
  Administered 2016-01-31: 3.375 g via INTRAVENOUS
  Filled 2016-01-31: qty 50

## 2016-01-31 MED ORDER — SODIUM CHLORIDE 0.9 % IV BOLUS (SEPSIS)
1000.0000 mL | Freq: Once | INTRAVENOUS | Status: AC
  Administered 2016-01-31: 1000 mL via INTRAVENOUS

## 2016-01-31 MED ORDER — ACETAMINOPHEN 325 MG PO TABS
650.0000 mg | ORAL_TABLET | Freq: Four times a day (QID) | ORAL | Status: DC | PRN
  Administered 2016-01-31 – 2016-02-04 (×3): 650 mg via ORAL
  Filled 2016-01-31 (×3): qty 2

## 2016-01-31 MED ORDER — ONDANSETRON HCL 4 MG/2ML IJ SOLN
4.0000 mg | Freq: Four times a day (QID) | INTRAMUSCULAR | Status: DC | PRN
  Administered 2016-01-31 – 2016-02-04 (×5): 4 mg via INTRAVENOUS
  Filled 2016-01-31 (×4): qty 2

## 2016-01-31 MED ORDER — ONDANSETRON 4 MG PO TBDP: 4.0000 mg | ORAL_TABLET | Freq: Four times a day (QID) | ORAL | Status: DC | PRN

## 2016-01-31 MED ORDER — PIPERACILLIN-TAZOBACTAM 3.375 G IVPB
3.3750 g | Freq: Three times a day (TID) | INTRAVENOUS | Status: DC
  Administered 2016-01-31 – 2016-02-07 (×20): 3.375 g via INTRAVENOUS
  Filled 2016-01-31 (×22): qty 50

## 2016-01-31 MED ORDER — HYDROMORPHONE HCL 1 MG/ML IJ SOLN
1.0000 mg | INTRAMUSCULAR | Status: DC | PRN
  Administered 2016-01-31 – 2016-02-05 (×32): 1 mg via INTRAVENOUS
  Filled 2016-01-31 (×32): qty 1

## 2016-01-31 NOTE — Telephone Encounter (Signed)
Message left from patients mother to obtain patients lab results from 01/31/16.  Return call placed to patients mother with no answer.  Left message to call [place] back when she is able.

## 2016-01-31 NOTE — ED Notes (Signed)
Pt states he has had diarrhea for the past 5 to 6 days  Pt states it started off as loose stools and has progressed to diarrhea  Pt states he has prescription medication but it is not helping  Pt states he has been running a fever as well  Pt states he took 2 tylenol at 2330 tonight for his fever  Pt states he has rectal cancer   Pt states he had blood work and stool tests at his doctor this week

## 2016-01-31 NOTE — ED Notes (Signed)
Patient transported to CT 

## 2016-01-31 NOTE — ED Provider Notes (Signed)
CSN: [id number]     Arrival date & time 01/31/16  0151 History   First MD Initiated Contact with Patient 01/31/16 0319     Chief Complaint  Patient presents with  . Diarrhea   HPI  [name] is a 35 year old male with a past medical history of rectal cancer presenting with diarrhea. Patient reports he has had loose stools changing to watery stools over the past 5-6 days. He states that today his stools are more formed and seem to be improving. Denies blood in his stools. He also complains of associated fever with the diarrhea. He had a temperature of 103 at home which prompted his emergency department visit. He has had a fever for the past week which properly resolves with antipyretics at home. He denies associated abdominal or rectal pain. He has been seen by his PCP and oncologist for this complaint. He was written a prescription for Imodium which he does believe has been helping. He does note that he has been recently taking MiraLAX with last intake 6 days ago prior to onset of the diarrhea.  Chart review-patient diagnosed with rectal cancer in 2015. He has undergone radiation and chemotherapy which was completed in 2016. He had anterior colon resection with ileostomy reversal in 2016. Patient was worked up for similar presentation in April with negative C. difficile screen. He was found to have possible dehiscence of anastomosis which was treated with abx.   Past Medical History  Diagnosis Date  . S/P radiation therapy 02/19/14-03/28/14    rectal  50.4Gy  . History of cancer chemotherapy     completed 09-03-2014  . Chemotherapy-induced neuropathy ([place])   . Wears glasses   . History of phlebitis     08-20-2014---- RIGHT UPPER INNER ARM--  RESOLVED   . Incomplete right bundle branch block   . Numbness     feet bilat   . Dehisced intestinal anastomosis     rectal  region --  Admitted 11-26-2015  . Anal abscess   . Anemia in neoplastic disease   . Iron deficiency   . Hypoalbuminemia due  to protein-calorie malnutrition ([place])   . History of ileostomy ([place])     takedown 07-12-2015  . Chronic diarrhea     NEGATIVE C-DIFF 11-22-2015  . Chronic nausea   . Rectal cancer [facility]) onocologist-  dr Benay Spice    dx 01-31-2015--  Stage III (ypT2, ypN2) invasive--  s/p  radiation (ended 03-28-2014) and chemo (complete 09-03-2014) and low anterior colon resection with ileostomy 05-18-2014   Past Surgical History  Procedure Laterality Date  . Wisdom tooth extraction  age 87  . Eus N/A 01/30/2014    Procedure: LOWER ENDOSCOPIC ULTRASOUND (EUS);  Surgeon: Arta Silence, MD;  Location: [facility] ENDOSCOPY;  Service: Endoscopy;  Laterality: N/A;  with biospy's  . Colonoscopy w/ biopsies  01/22/2014    fungating nonobstructing large mass distal rectum  . Portacath placement Left 05/31/2014    Procedure: INSERTION PORT-A-CATH LET SUBCLAVIAN;  Surgeon: [name], MD;  Location: [facility];  Service: General;  Laterality: Left;  . Robot assisted low anterior colon resection with flexure mobilization /  hand sewn colo-anal anastomiosis/  diverting loop ileostomy  05-18-2014  . Rectal exam under anesthesia N/A 11/15/2014    Procedure: ANAL EXAM UNDER ANESTHESIA, RIGID PROTOSCOPY;  Surgeon: [name], MD;  Location: [facility];  Service: General;  Laterality: N/A;  . Flexible sigmoidoscopy N/A 11/15/2014    Procedure: FLEXIBLE SIGMOIDOSCOPY;  Surgeon: [name], MD;  Location: [place];  Service: General;  Laterality: N/A;  . Repair of rectal prolapse N/A 04/18/2015    Procedure: RETROGRADE COLONOSCOPY AND DILATION OF ANAL STRICTURE;  Surgeon: [name], MD;  Location: [facility];  Service: General;  Laterality: N/A;  . Ileostomy closure N/A 07/12/2015    Procedure: ILEOSTOMY REVERSAL;  Surgeon: [name], MD;  Location: [facility];  Service: General;  Laterality: N/A;  . Laparoscopy N/A 07/12/2015    Procedure: LAPAROSCOPY DIAGNOSTIC;  Surgeon: [name], MD;  Location:  [facility];  Service: General;  Laterality: N/A;  . Anal fistulotomy N/A 12/26/2015    Procedure: ANAL EUA, FISTULOTOMY;  Surgeon: [name], MD;  Location: [facility];  Service: General;  Laterality: N/A;   Family History  Problem Relation Age of Onset  . Breast cancer Maternal Aunt     pat mat great aunt; currently 61  . Leukemia Maternal Aunt     pat mat great aunt; deceased 60s  . Other Paternal Aunt     colon polyps (type/#?)   Social History  Substance Use Topics  . Smoking status: Former Smoker -- 0.25 packs/day for 5 years    Types: Cigarettes    Quit date: 08/11/2007  . Smokeless tobacco: Former Systems developer    Types: Snuff, Chew    Quit date: 02/01/2006  . Alcohol Use: No    Review of Systems  All other systems reviewed and are negative.     Allergies  Zithromax and Compazine  Home Medications   Prior to Admission medications   Medication Sig Start Date End Date Taking? Authorizing Provider  acetaminophen (TYLENOL) 325 MG tablet Take 325 mg by mouth once as needed for mild pain (pain). Reported on 07/29/2015   Yes Historical Provider, MD  bismuth subsalicylate (PEPTO BISMOL) 262 MG/15ML suspension Take 30 mLs by mouth daily as needed for diarrhea or loose stools.   Yes Historical Provider, MD  cetirizine (ZYRTEC) 10 MG tablet Take 10 mg by mouth daily as needed for allergies.   Yes Historical Provider, MD  diphenoxylate-atropine (LOMOTIL) 2.5-0.025 MG tablet Take 1 tablet by mouth 4 (four) times daily as needed for diarrhea or loose stools.   Yes Historical Provider, MD  loperamide (IMODIUM) 2 MG capsule Take 4 mg by mouth every 6 (six) hours as needed for diarrhea or loose stools.   Yes Historical Provider, MD  metroNIDAZOLE (FLAGYL) 500 MG tablet Take 1 tablet by mouth 3 (three) times daily. 01/09/16  Yes Historical Provider, MD  ondansetron (ZOFRAN) 4 MG tablet Take 1 tablet (4 mg total) by mouth every 8 (eight) hours as needed for nausea or vomiting.  [id number]  Yes [name], MD  simethicone (MYLICON) 80 MG chewable tablet Chew 80 mg by mouth every 6 (six) hours as needed for flatulence.   Yes Historical Provider, MD  LORazepam (ATIVAN) 0.5 MG tablet Take 1 tablet (0.5 mg total) by mouth daily as needed for anxiety. Patient not taking: Reported on 01/31/2016 12/26/15   Ladell Pier, MD  oxyCODONE (OXY IR/ROXICODONE) 5 MG immediate release tablet Take 1 tablet (5 mg total) by mouth every 6 (six) hours as needed for moderate pain. Patient not taking: Reported on [id number]   [name], MD   BP 113/69 mmHg  Pulse 98  Temp(Src) 98.6 F (37 C) (Oral)  Resp 18  SpO2 100% Physical Exam  Constitutional: He appears well-developed and well-nourished. No distress.  Anxious appearing. Thin.   HENT:  Head:  Normocephalic and atraumatic.  Eyes: Conjunctivae are normal. Right eye exhibits no discharge. Left eye exhibits no discharge. No scleral icterus.  Neck: Normal range of motion.  Cardiovascular: Normal rate, regular rhythm and normal heart sounds.   Pulmonary/Chest: Effort normal and breath sounds normal. No respiratory distress.  Abdominal: Soft. Bowel sounds are normal. There is no tenderness. There is no rebound and no guarding.  Normoactive bowel sounds. Abdomen is soft, nontender. Well-healed surgical wound to right lower abdomen.  Musculoskeletal: Normal range of motion.  Neurological: He is alert. Coordination normal.  Skin: Skin is warm and dry.  Psychiatric: He has a normal mood and affect. His behavior is normal.  Nursing note and vitals reviewed.   ED Course  Procedures (including critical care time) Labs Review Labs Reviewed  CBC WITH DIFFERENTIAL/PLATELET - Abnormal; Notable for the following:    RBC 3.64 (*)    Hemoglobin 8.5 (*)    HCT 27.0 (*)    MCV 74.2 (*)    MCH 23.4 (*)    RDW 18.9 (*)    Monocytes Absolute 1.3 (*)    All other components within normal limits  BASIC METABOLIC PANEL - Abnormal;  Notable for the following:    Sodium 131 (*)    Chloride 98 (*)    Calcium 7.9 (*)    All other components within normal limits  URINALYSIS, ROUTINE W REFLEX MICROSCOPIC (NOT AT [facility] - Abnormal; Notable for the following:    Specific Gravity, Urine 1.004 (*)    All other components within normal limits  CULTURE, BLOOD (ROUTINE X 2)  CULTURE, BLOOD (ROUTINE X 2)  I-STAT CG4 LACTIC ACID, ED  I-STAT CG4 LACTIC ACID, ED    Imaging Review Ct Abdomen Pelvis W Contrast  01/31/2016  ADDENDUM REPORT: 01/31/2016 08:31 ADDENDUM: Critical Value/emergent results were called by telephone at the time of interpretation on 01/31/2016 at 8:31 am to [place], the PA caring for the patient , who verbally acknowledged these results. Electronically Signed   By: Skipper Cliche M.D.   On: 01/31/2016 08:31  01/31/2016  CLINICAL DATA:  Diarrhea for 5-6 days, fever, hx of rectal ca 6/15- ileostomy and closure, radiation and chemo complete EXAM: CT ABDOMEN AND PELVIS WITH CONTRAST TECHNIQUE: Multidetector CT imaging of the abdomen and pelvis was performed using the standard protocol following bolus administration of intravenous contrast. CONTRAST:  100 ml isovue 300 COMPARISON:  11/22/15 FINDINGS: Lower chest:  Clear Hepatobiliary: Normal Pancreas: Normal Spleen: Normal Adrenals/Urinary Tract: Normal Stomach/Bowel: The bowel gas pattern urine surgical at most fall Small bowel all. Stool retained throughout the colon. Moderate rectal wall thickening. Significant presacral space inflammatory change at the rectal wall is indistinct in the absence of enteric contrast. Vascular/Lymphatic: No acute vascular abnormalities. Borderline. Aortic retroperitoneal lymph nodes unchanged. Reproductive: No acute findings Other: There is bilateral extraluminal free air in the inferior pelvis, along the pelvic floor as well as in the presacral space. There abnormal soft tissue anterior to the sacrum is 2.2 cm in AP diameter, with several small  areas that show mildly decreased attenuation and air, the largest measuring approximately 1.5 cm. The volume of presacral inflammatory soft tissue, and the volume of free air in the pelvis, are both mildly increased compared to prior study. Musculoskeletal: Periosteal reaction involving the anterior cortex of the inferior sacrum is mildly more extensive than on the prior study. IMPRESSION: Findings again concerning for dehiscence at rectal anastomosis, including an increased volume of heterogenous presacral inflammatory soft tissue and  free air. Small areas of heterogeneity within the presacral soft tissue could represent abscesses.There is also increased periosteal reaction involving the anterior cortex of the sacrum suggesting concomitant osteomyelitis. Electronically Signed: By: Skipper Cliche M.D. On: 01/31/2016 08:26   I have personally reviewed and evaluated these images and lab results as part of my medical decision-making.   EKG Interpretation None      MDM   Final diagnoses:  Diarrhea, unspecified type  Diarrhea   35 year old male presenting with diarrhea x 6 days. Hx of rectal cancer. Initially afebrile then pt spiked fever of 102.6. Tylenol and fluids given. Abdomen is soft, non-tender. Pt had multiple episodes of diarrhea while in ED. No leukocytosis. Hgb 8.5 which appears on the low end of pt's baseline Hgb. Mild hyponatremia. UA negative for infection. Patient care signed out to oncoming provider, Tatayana PA-C, pending abdominal CT results.    Josephina Gip, PA-C 01/31/16 1226  April Palumbo, MD 02/01/16 0109

## 2016-01-31 NOTE — ED Notes (Signed)
Pt sts he would like his port accessed for blood draw and fluids.  RN notified.

## 2016-01-31 NOTE — ED Notes (Signed)
Pt reports that he has taken an imodium at the bedside prior to the tylenol.

## 2016-01-31 NOTE — H&P (Signed)
Chief Complaint: dehiscence of rectal anastomosis, diarrhea, fever  HPI: Seth Villanueva is a 35 year old male with history of distal rectal cancer s/p XRT and robotic assisted LAR, hand sewn colo anal anastomosis and diverting loop ileostomy 05/2014 by Dr. 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  The patient was admitted in April of 2017 at which time there was question of dehiscence of rectal anastomosis.  He was managed with antibiotics.  He then had an EUA and fistulotomy 12/26/15 by Dr. Marcello Moores.    He presents today with a 6 day history of diarrhea, was taking Imodium and then felt constipated yesterday, stopped taking Imodium and had a bowel movement.  He was seen in our office yesterday.  Has had 2 negative c diffs.  Endorses to fevers of 103.  Appetite has been great, denies nausea, vomiting or abdominal pain.  No ill close contacts.  ED work up reveals, normal white count, temp 102.6 and heart rate of 132. Lactic acid is normal.    Ct of abdomen and pelvis suggest dehiscence of rectal anastomosis and osteomyelitis.      Past Medical History  Diagnosis Date  . S/P radiation therapy 02/19/14-03/28/14    rectal  50.4Gy  . History of cancer chemotherapy     completed 09-03-2014  . Chemotherapy-induced neuropathy ([place])   . Wears glasses   . History of phlebitis     08-20-2014---- RIGHT UPPER INNER ARM--  RESOLVED   . Incomplete right bundle branch block   . Numbness     feet bilat   . Dehisced intestinal anastomosis     rectal  region --  Admitted 11-26-2015  . Anal abscess   . Anemia in neoplastic disease   . Iron deficiency   . Hypoalbuminemia due to protein-calorie malnutrition ([place])   . History of ileostomy ([place])     takedown 07-12-2015  . Chronic diarrhea     NEGATIVE C-DIFF 11-22-2015  . Chronic nausea   . Rectal cancer [facility]) onocologist-  dr Benay Spice    dx 01-31-2015--  Stage III (ypT2, ypN2) invasive--  s/p  radiation  (ended 03-28-2014) and chemo (complete 09-03-2014) and low anterior colon resection with ileostomy 05-18-2014    Past Surgical History  Procedure Laterality Date  . Wisdom tooth extraction  age 53  . Eus N/A 01/30/2014    Procedure: LOWER ENDOSCOPIC ULTRASOUND (EUS);  Surgeon: Arta Silence, MD;  Location: [facility] ENDOSCOPY;  Service: Endoscopy;  Laterality: N/A;  with biospy's  . Colonoscopy w/ biopsies  01/22/2014    fungating nonobstructing large mass distal rectum  . Portacath placement Left 05/31/2014    Procedure: INSERTION PORT-A-CATH LET SUBCLAVIAN;  Surgeon: [name], MD;  Location: [facility];  Service: General;  Laterality: Left;  . Robot assisted low anterior colon resection with flexure mobilization /  hand sewn colo-anal anastomiosis/  diverting loop ileostomy  05-18-2014  . Rectal exam under anesthesia N/A 11/15/2014    Procedure: ANAL EXAM UNDER ANESTHESIA, RIGID PROTOSCOPY;  Surgeon: [name], MD;  Location: [place];  Service: General;  Laterality: N/A;  . Flexible sigmoidoscopy N/A 11/15/2014    Procedure: FLEXIBLE SIGMOIDOSCOPY;  Surgeon: [name], MD;  Location: [facility];  Service: General;  Laterality: N/A;  . Repair of rectal prolapse N/A 04/18/2015    Procedure: RETROGRADE COLONOSCOPY AND DILATION OF ANAL STRICTURE;  Surgeon: [name], MD;  Location: [facility];  Service: General;  Laterality: N/A;  . Ileostomy closure  N/A 07/12/2015    Procedure: ILEOSTOMY REVERSAL;  Surgeon: [name], MD;  Location: [facility];  Service: General;  Laterality: N/A;  . Laparoscopy N/A 07/12/2015    Procedure: LAPAROSCOPY DIAGNOSTIC;  Surgeon: [name], MD;  Location: [facility];  Service: General;  Laterality: N/A;  . Anal fistulotomy N/A 12/26/2015    Procedure: ANAL EUA, FISTULOTOMY;  Surgeon: [name], MD;  Location: [facility];  Service: General;  Laterality: N/A;    Family History  Problem Relation Age of Onset  . Breast  cancer Maternal Aunt     pat mat great aunt; currently 60  . Leukemia Maternal Aunt     pat mat great aunt; deceased 77s  . Other Paternal Aunt     colon polyps (type/#?)   Social History:  reports that he quit smoking about 8 years ago. His smoking use included Cigarettes. He has a 1.25 pack-year smoking history. He quit smokeless tobacco use about 10 years ago. His smokeless tobacco use included Snuff and Chew. He reports that he does not drink alcohol or use illicit drugs.  Allergies:  Allergies  Allergen Reactions  . Zithromax [Azithromycin] Hives  . Compazine [Prochlorperazine] Anxiety     (Not in a hospital admission)  Results for orders placed or performed during the hospital encounter of 01/31/16 (from the past 48 hour(s))  Urinalysis, Routine w reflex microscopic (not at [facility])     Status: Abnormal   Collection Time: 01/31/16  3:58 AM  Result Value Ref Range   Color, Urine YELLOW YELLOW   APPearance CLEAR CLEAR   Specific Gravity, Urine 1.004 (L) 1.005 - 1.030   pH 7.0 5.0 - 8.0   Glucose, UA NEGATIVE NEGATIVE mg/dL   Hgb urine dipstick NEGATIVE NEGATIVE   Bilirubin Urine NEGATIVE NEGATIVE   Ketones, ur NEGATIVE NEGATIVE mg/dL   Protein, ur NEGATIVE NEGATIVE mg/dL   Nitrite NEGATIVE NEGATIVE   Leukocytes, UA NEGATIVE NEGATIVE    Comment: MICROSCOPIC NOT DONE ON URINES WITH NEGATIVE PROTEIN, BLOOD, LEUKOCYTES, NITRITE, OR GLUCOSE <1000 mg/dL.  CBC with Differential     Status: Abnormal   Collection Time: 01/31/16  4:30 AM  Result Value Ref Range   WBC 8.1 4.0 - 10.5 K/uL   RBC 3.64 (L) 4.22 - 5.81 MIL/uL   Hemoglobin 8.5 (L) 13.0 - 17.0 g/dL   HCT 27.0 (L) 39.0 - 52.0 %   MCV 74.2 (L) 78.0 - 100.0 fL   MCH 23.4 (L) 26.0 - 34.0 pg   MCHC 31.5 30.0 - 36.0 g/dL   RDW 18.9 (H) 11.5 - 15.5 %   Platelets 383 150 - 400 K/uL   Neutrophils Relative % 68 %   Neutro Abs 5.5 1.7 - 7.7 K/uL   Lymphocytes Relative 15 %   Lymphs Abs 1.2 0.7 - 4.0 K/uL   Monocytes Relative  16 %   Monocytes Absolute 1.3 (H) 0.1 - 1.0 K/uL   Eosinophils Relative 1 %   Eosinophils Absolute 0.1 0.0 - 0.7 K/uL   Basophils Relative 0 %   Basophils Absolute 0.0 0.0 - 0.1 K/uL  Basic metabolic panel     Status: Abnormal   Collection Time: 01/31/16  4:30 AM  Result Value Ref Range   Sodium 131 (L) 135 - 145 mmol/L   Potassium 3.7 3.5 - 5.1 mmol/L   Chloride 98 (L) 101 - 111 mmol/L   CO2 25 22 - 32 mmol/L   Glucose, Bld 97 65 - 99 mg/dL  BUN 10 6 - 20 mg/dL   Creatinine, Ser 0.61 0.61 - 1.24 mg/dL   Calcium 7.9 (L) 8.9 - 10.3 mg/dL   GFR calc non Af Amer >60 >60 mL/min   GFR calc Af Amer >60 >60 mL/min    Comment: (NOTE) The eGFR has been calculated using the CKD EPI equation. This calculation has not been validated in all clinical situations. eGFR's persistently <60 mL/min signify possible Chronic Kidney Disease.    Anion gap 8 5 - 15   Ct Abdomen Pelvis W Contrast  01/31/2016  ADDENDUM REPORT: 01/31/2016 08:31 ADDENDUM: Critical Value/emergent results were called by telephone at the time of interpretation on 01/31/2016 at 8:31 am to [facility], the PA caring for the patient , who verbally acknowledged these results. Electronically Signed   By: Skipper Cliche M.D.   On: 01/31/2016 08:31  01/31/2016  CLINICAL DATA:  Diarrhea for 5-6 days, fever, hx of rectal ca 6/15- ileostomy and closure, radiation and chemo complete EXAM: CT ABDOMEN AND PELVIS WITH CONTRAST TECHNIQUE: Multidetector CT imaging of the abdomen and pelvis was performed using the standard protocol following bolus administration of intravenous contrast. CONTRAST:  100 ml isovue 300 COMPARISON:  11/22/15 FINDINGS: Lower chest:  Clear Hepatobiliary: Normal Pancreas: Normal Spleen: Normal Adrenals/Urinary Tract: Normal Stomach/Bowel: The bowel gas pattern urine surgical at most fall Small bowel all. Stool retained throughout the colon. Moderate rectal wall thickening. Significant presacral space inflammatory change at the  rectal wall is indistinct in the absence of enteric contrast. Vascular/Lymphatic: No acute vascular abnormalities. Borderline. Aortic retroperitoneal lymph nodes unchanged. Reproductive: No acute findings Other: There is bilateral extraluminal free air in the inferior pelvis, along the pelvic floor as well as in the presacral space. There abnormal soft tissue anterior to the sacrum is 2.2 cm in AP diameter, with several small areas that show mildly decreased attenuation and air, the largest measuring approximately 1.5 cm. The volume of presacral inflammatory soft tissue, and the volume of free air in the pelvis, are both mildly increased compared to prior study. Musculoskeletal: Periosteal reaction involving the anterior cortex of the inferior sacrum is mildly more extensive than on the prior study. IMPRESSION: Findings again concerning for dehiscence at rectal anastomosis, including an increased volume of heterogenous presacral inflammatory soft tissue and free air. Small areas of heterogeneity within the presacral soft tissue could represent abscesses.There is also increased periosteal reaction involving the anterior cortex of the sacrum suggesting concomitant osteomyelitis. Electronically Signed: By: Skipper Cliche M.D. On: 01/31/2016 08:26    Review of Systems  Constitutional: Positive for fever and malaise/fatigue. Negative for weight loss and diaphoresis.  Respiratory: Negative for cough, hemoptysis, sputum production, shortness of breath and wheezing.   Cardiovascular: Negative for chest pain, palpitations, orthopnea, claudication, leg swelling and PND.  Gastrointestinal: Positive for abdominal pain, diarrhea, constipation and blood in stool. Negative for nausea and vomiting.  Genitourinary: Negative for dysuria, urgency, frequency, hematuria and flank pain.  Musculoskeletal: Negative for myalgias, back pain and neck pain.  Neurological: Negative for dizziness, tingling, tremors, sensory change,  speech change, focal weakness, seizures, loss of consciousness, weakness and headaches.    Blood pressure 113/69, pulse 98, temperature 102.6 F (39.2 C), temperature source Oral, resp. rate 18, SpO2 100 %. Physical Exam  Constitutional: He is oriented to person, place, and time. He appears well-developed and well-nourished. No distress.  Cardiovascular: Normal rate, normal heart sounds and intact distal pulses.  Exam reveals no gallop.   No murmur heard. Respiratory: Effort  normal and breath sounds normal. No respiratory distress. He has no wheezes. He has no rales. He exhibits no tenderness.  GI: Bowel sounds are normal. He exhibits no distension and no mass. There is no tenderness. There is no rebound and no guarding.  Musculoskeletal: Normal range of motion. He exhibits no edema or tenderness.  Neurological: He is alert and oriented to person, place, and time.  Skin: Skin is warm and dry. No rash noted. He is not diaphoretic. No erythema. No pallor.  Psychiatric: He has a normal mood and affect. His behavior is normal. Judgment and thought content normal.     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 -hospitalized 11/2015 for disruption of rectal anastomosis and treated with antibiotics -EUA and fistulotomy 12/26/15    Fevers, tachycardia, diarrhea Questionable dehiscence of rectal anastomosis Will admit for bowel rest, IV hydration and IV antibiotics.  Will discuss further management with Dr. Marcello Moores. Presumed osteomyelitis-antibiotics for now, ?add Vanc.  ? ID consult.  [id number]. BCx2 pending. FEN-NPO for now, IVF VTE prophylaxis-scd, lovenox  Dispo-to floor   Martise Waddell, NP 01/31/2016, 9:41 AM

## 2016-01-31 NOTE — Progress Notes (Signed)
Pharmacy Antibiotic Note  Seth Villanueva is a 35 y.o. male admitted on 01/31/2016 with osteomyelitis.  He has hx of rectal CA s/p colo-anal anastomosis & ileostomy in [id number] with complications requiring reversal of ileostomy in 12/16 and dehiscence of anastomosis in April 2017 for which he has been on antibiotics (currently on Flagyl).  CT of abdomen today suggestive of wound dehiscence & osteomyelitis.   He is being started on broad-spectrum IV antibiotics.  Pharmacy has been consulted for Vancomycin dosing.   01/31/2016:   Afebrile currently (Tm 102.6)  LA, WBC wnl  Renal function at baseline (est CrCl > 175ml/min)  Plan:  Vancomycin 1gm IV q8h for goal trough 15-61mcg/ml Check Vancomycin trough at steady state ZEI per MD Monitor renal function and cx data  F/U ID recommendations     Temp (24hrs), Avg:100.5 F (38.1 C), Min:98.6 F (37 C), Max:102.6 F (39.2 C)   Recent Labs Lab 01/31/16 0430 01/31/16 0951  WBC 8.1  --   CREATININE 0.61  --   LATICACIDVEN  --  0.82    Estimated Creatinine Clearance: 121.1 mL/min (by C-G formula based on Cr of 0.61).    Allergies  Allergen Reactions  . Zithromax [Azithromycin] Hives  . Compazine [Prochlorperazine] Anxiety    Antimicrobials this admission: Vanc 6/23 >>  Zosyn 6/23 >>   Dose adjustments this admission:  Microbiology results: 6/23 BCx: sent  Thank you for allowing pharmacy to be a part of this patient's care.  Netta Cedars, PharmD, BCPS Pager: [phone] 01/31/2016 11:33 AM

## 2016-01-31 NOTE — ED Provider Notes (Signed)
9:07 AM Patient signed out to me at shift change. Patient presents to emergency department with diarrhea for proximal 5-6 days. Patient with history of rectal cancer with prior sigmoid resection and colostomy with takedown in December 2016. Patient finished course of chemotherapy and radiation. Since December, he has had intermittent rectal pain and diarrhea. He is followed by Dr. Marcello Moores with general surgery. In April of this year, 2 months ago, he was admitted for diarrhea, CT scan at that time showed possible dehiscence of the anastomosis. he was treated with antibiotics. He felt better at that time. Patient also had a new perirectal fistula which apparently was surgically treated by Dr. Marcello Moores earlier this month. He states he started to feel better from that as well. He also reports with his diarrhea and intermittent fever.  Patient initially treated symptomatically. Patient apparently has had stool cultures and C. difficile already tested by his doctor. Upon discharge, patient spiked a fever 102.6 and heart rate went up to 132. I have ordered him some Tylenol and IV fluids. CT scan was ordered and was pending.  CT resulted in worsening anastomosis dehiscence and possible osteomyelitis. I ordered him Zosyn for antibiotics. I discussed patient with general surgery who will come by and see him.  Filed Vitals:   01/31/16 0653 01/31/16 0712 01/31/16 0932 01/31/16 1315  BP:  123/72 113/69 116/60  Pulse:  132 98 107  Temp: 102.6 F (39.2 C)  98.6 F (37 C) 102.2 F (39 C)  TempSrc: Oral  Oral Oral  Resp:  18 18 18   Height:    6\' 2"  (1.88 m)  Weight:    64.4 kg  SpO2:  98% 100% 100%     Seth Senior, PA-C 01/31/16 1632  Tanna Furry, MD 02/07/16 [phone]

## 2016-01-31 NOTE — ED Notes (Signed)
In bathroom

## 2016-01-31 NOTE — ED Notes (Signed)
Ambulated to bathroom no issues. Given briefs and barrier cream.

## 2016-01-31 NOTE — ED Notes (Signed)
PA at bedside.

## 2016-02-01 LAB — BASIC METABOLIC PANEL
Anion gap: 3 — ABNORMAL LOW (ref 5–15)
BUN: 6 mg/dL (ref 6–20)
CHLORIDE: 106 mmol/L (ref 101–111)
CO2: 27 mmol/L (ref 22–32)
CREATININE: 0.76 mg/dL (ref 0.61–1.24)
Calcium: 7.9 mg/dL — ABNORMAL LOW (ref 8.9–10.3)
Glucose, Bld: 108 mg/dL — ABNORMAL HIGH (ref 65–99)
Potassium: 3.8 mmol/L (ref 3.5–5.1)
SODIUM: 136 mmol/L (ref 135–145)

## 2016-02-01 LAB — CBC
HCT: 21.4 % — ABNORMAL LOW (ref 39.0–52.0)
Hemoglobin: 6.6 g/dL — CL (ref 13.0–17.0)
MCH: 23.2 pg — ABNORMAL LOW (ref 26.0–34.0)
MCHC: 30.8 g/dL (ref 30.0–36.0)
MCV: 75.1 fL — AB (ref 78.0–100.0)
PLATELETS: 266 10*3/uL (ref 150–400)
RBC: 2.85 MIL/uL — AB (ref 4.22–5.81)
RDW: 19.5 % — AB (ref 11.5–15.5)
WBC: 5.6 10*3/uL (ref 4.0–10.5)

## 2016-02-01 LAB — PREPARE RBC (CROSSMATCH)

## 2016-02-01 LAB — VANCOMYCIN, TROUGH: VANCOMYCIN TR: 19 ug/mL (ref 10.0–20.0)

## 2016-02-01 MED ORDER — ENSURE ENLIVE PO LIQD
237.0000 mL | Freq: Four times a day (QID) | ORAL | Status: DC
  Administered 2016-02-01 – 2016-02-03 (×9): 237 mL via ORAL

## 2016-02-01 MED ORDER — SODIUM CHLORIDE 0.9 % IV SOLN
Freq: Once | INTRAVENOUS | Status: AC
  Administered 2016-02-01: 08:00:00 via INTRAVENOUS

## 2016-02-01 NOTE — Progress Notes (Signed)
Pharmacy Antibiotic Note  Seth Villanueva is a 35 y.o. male admitted on 01/31/2016 with osteomyelitis.  He has hx of rectal CA s/p colo-anal anastomosis & ileostomy in [id number] with complications requiring reversal of ileostomy in 12/16 and dehiscence of anastomosis in April 2017 for which he has been on antibiotics (currently on Flagyl).  CT of abdomen today suggestive of wound dehiscence & osteomyelitis.   He is being started on broad-spectrum IV antibiotics.  Pharmacy has been consulted for Vancomycin dosing.   02/01/2016:   Tmax 102.2, now low-grade temps  LA, WBC wnl  Renal function at baseline (est CrCl > 171ml/min)  Plan:  Vancomycin 1gm IV q8h for goal trough 15-45mcg/ml Check Vancomycin trough at steady state Zosyn EI per MD Monitor renal function and cx data  F/U ID recommendations  Height: 6\' 2"  (188 cm) Weight: 141 lb 15.6 oz (64.4 kg) IBW/kg (Calculated) : 82.2  Temp (24hrs), Avg:100.1 F (37.8 C), Min:98.8 F (37.1 C), Max:102.2 F (39 C)   Recent Labs Lab 01/31/16 0430 01/31/16 0951 02/01/16 0600  WBC 8.1  --  5.6  CREATININE 0.61  --  0.76  LATICACIDVEN  --  0.82  --     Estimated Creatinine Clearance: 120.8 mL/min (by C-G formula based on Cr of 0.76).    Allergies  Allergen Reactions  . Zithromax [Azithromycin] Hives  . Compazine [Prochlorperazine] Anxiety   Antimicrobials this admission: Vanc 6/23 >>  Zosyn 6/23 >>   Dose adjustments this admission: Plan trough today at 1900 prior to 2000 dose = ____  Microbiology results: 6/23 BCx: ngtd  Thank you for allowing pharmacy to be a part of this patient's care.  Minda Ditto PharmD Pager [phone] 02/01/2016, 11:07 AM

## 2016-02-01 NOTE — Progress Notes (Signed)
Pharmacy Antibiotic Note  See progress note from Graylin Shiver, PharmD from earlier today for full details.  Vancomycin trough = 19 mcg/ml on 1g IV q8h which is within goal of 15-20 mcg/ml  Plan:  Continue current regimen  Monitor renal function, clinical progress and duration of therapy  Peggyann Juba, PharmD, BCPS Pager: [phone] 02/01/2016 8:27 PM

## 2016-02-01 NOTE — Progress Notes (Signed)
  Progress Note: General Surgery Service   Subjective: Slept well overnight, no new pains, no hematochezia  Objective: Vital signs in last 24 hours: Temp:  [98.6 F (37 C)-102.2 F (39 C)] 99.6 F (37.6 C) (06/24 0501) Pulse Rate:  [83-107] 83 (06/24 0501) Resp:  [18] 18 (06/24 0501) BP: (108-116)/(59-69) 109/59 mmHg (06/24 0501) SpO2:  [99 %-100 %] 99 % (06/24 0501) Weight:  [64.4 kg (141 lb 15.6 oz)] 64.4 kg (141 lb 15.6 oz) (06/23 1315) Last BM Date: 01/31/16  Intake/Output from previous day:   Intake/Output this shift:    Lungs: CTAB  Cardiovascular: RRR  Abd: soft, NT, ND  Extremities: no edema  Neuro: AOx4  Lab Results: CBC   Recent Labs  01/31/16 0430 02/01/16 0600  WBC 8.1 5.6  HGB 8.5* 6.6*  HCT 27.0* 21.4*  PLT 383 266   BMET  Recent Labs  01/31/16 0430 02/01/16 0600  NA 131* 136  K 3.7 3.8  CL 98* 106  CO2 25 27  GLUCOSE 97 108*  BUN 10 6  CREATININE 0.61 0.76  CALCIUM 7.9* 7.9*   PT/INR No results for input(s): LABPROT, INR in the last 72 hours. ABG No results for input(s): PHART, HCO3 in the last 72 hours.  Invalid input(s): PCO2, PO2  Studies/Results:  Anti-infectives: Anti-infectives    Start     Dose/Rate Route Frequency Ordered Stop   01/31/16 1400  piperacillin-tazobactam (ZOSYN) IVPB 3.375 g     3.375 g 12.5 mL/hr over 240 Minutes Intravenous Every 8 hours 01/31/16 1012     01/31/16 1200  vancomycin (VANCOCIN) IVPB 1000 mg/200 mL premix     1,000 mg 200 mL/hr over 60 Minutes Intravenous Every 8 hours 01/31/16 1145     01/31/16 0845  piperacillin-tazobactam (ZOSYN) IVPB 3.375 g     3.375 g 12.5 mL/hr over 240 Minutes Intravenous  Once 01/31/16 0835 01/31/16 1325      Medications: Scheduled Meds: . [COMPLETED] sodium chloride   Intravenous Once  . enoxaparin (LOVENOX) injection  40 mg Subcutaneous Q24H  . feeding supplement (ENSURE ENLIVE)  237 mL Oral Q6H  . piperacillin-tazobactam (ZOSYN)  IV  3.375 g  Intravenous Q8H  . sodium chloride  1,000 mL Intravenous Once  . vancomycin  1,000 mg Intravenous Q8H   Continuous Infusions: . dextrose 5 % and 0.9 % NaCl with KCl 20 mEq/L 100 mL/hr at 02/01/16 0047   PRN Meds:.acetaminophen **OR** acetaminophen, HYDROmorphone (DILAUDID) injection, ondansetron **OR** ondansetron (ZOFRAN) IV  Assessment/Plan: Patient Active Problem List   Diagnosis Date Noted  . Protein-calorie malnutrition, severe 11/24/2015  . Dehiscence of intestinal anastomosis 11/23/2015  . 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 Fever 11/22/2015  . Diarrhea 11/22/2015  . Unintentional weight change 11/22/2015  . Weakness 11/22/2015  . Nausea without vomiting 11/22/2015  . Hypoalbuminemia due to protein-calorie malnutrition ([place]) 11/22/2015  . 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 Iron deficiency anemia 11/22/2015  . Acute blood loss anemia 11/22/2015  . Genetic testing 06/26/2015  . Small bowel obstruction ([place]) 04/22/2015  . 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 Rectal cancer ([place]) 02/01/2014  Hemoglobin drop, likely chronic anemia exposed with resuscitation yesterday -2 units pRBCs -fulls & ensure -continue abx   LOS: 1 day   Mickeal Skinner, MD Pg# [phone] [facility]

## 2016-02-02 LAB — CBC WITH DIFFERENTIAL/PLATELET
BASOS ABS: 0 10*3/uL (ref 0.0–0.1)
BASOS PCT: 0 %
Eosinophils Absolute: 0.1 10*3/uL (ref 0.0–0.7)
Eosinophils Relative: 2 %
HEMATOCRIT: 25.8 % — AB (ref 39.0–52.0)
HEMOGLOBIN: 8.3 g/dL — AB (ref 13.0–17.0)
LYMPHS PCT: 17 %
Lymphs Abs: 1.1 10*3/uL (ref 0.7–4.0)
MCH: 23.9 pg — ABNORMAL LOW (ref 26.0–34.0)
MCHC: 32.2 g/dL (ref 30.0–36.0)
MCV: 74.1 fL — AB (ref 78.0–100.0)
Monocytes Absolute: 1.1 10*3/uL — ABNORMAL HIGH (ref 0.1–1.0)
Monocytes Relative: 18 %
NEUTROS ABS: 4.1 10*3/uL (ref 1.7–7.7)
NEUTROS PCT: 64 %
Platelets: 306 10*3/uL (ref 150–400)
RBC: 3.48 MIL/uL — AB (ref 4.22–5.81)
RDW: 18.2 % — ABNORMAL HIGH (ref 11.5–15.5)
WBC: 6.5 10*3/uL (ref 4.0–10.5)

## 2016-02-02 NOTE — Progress Notes (Signed)
  Progress Note: General Surgery Service   Subjective: Tolerated liquids, feels less foggy this morning, noted no high fevers when sleeping, continued diarrhea, though had a constipation episode yesterday also  Objective: Vital signs in last 24 hours: Temp:  [98.4 F (36.9 C)-100.4 F (38 C)] 99.3 F (37.4 C) (06/25 0553) Pulse Rate:  [86-105] 86 (06/25 0553) Resp:  [16-18] 16 (06/25 0553) BP: (103-133)/(55-80) 103/55 mmHg (06/25 0553) SpO2:  [98 %-100 %] 98 % (06/25 0553) Last BM Date: 01/31/16  Intake/Output from previous day: 06/24 0701 - 06/25 0700 In: 1043.8 [P.O.:240; I.V.:120; Blood:683.8] Out: -  Intake/Output this shift:    Lungs: CTAB  Cardiovascular: RRR  Abd: soft, NT, ND  Extremities: no edema  Neuro: AOx4  Lab Results: CBC   Recent Labs  01/31/16 0430 02/01/16 0600  WBC 8.1 5.6  HGB 8.5* 6.6*  HCT 27.0* 21.4*  PLT 383 266   BMET  Recent Labs  01/31/16 0430 02/01/16 0600  NA 131* 136  K 3.7 3.8  CL 98* 106  CO2 25 27  GLUCOSE 97 108*  BUN 10 6  CREATININE 0.61 0.76  CALCIUM 7.9* 7.9*   PT/INR No results for input(s): LABPROT, INR in the last 72 hours. ABG No results for input(s): PHART, HCO3 in the last 72 hours.  Invalid input(s): PCO2, PO2  Studies/Results:  Anti-infectives: Anti-infectives    Start     Dose/Rate Route Frequency Ordered Stop   01/31/16 1400  piperacillin-tazobactam (ZOSYN) IVPB 3.375 g     3.375 g 12.5 mL/hr over 240 Minutes Intravenous Every 8 hours 01/31/16 1012     01/31/16 1200  vancomycin (VANCOCIN) IVPB 1000 mg/200 mL premix     1,000 mg 200 mL/hr over 60 Minutes Intravenous Every 8 hours 01/31/16 1145     01/31/16 0845  piperacillin-tazobactam (ZOSYN) IVPB 3.375 g     3.375 g 12.5 mL/hr over 240 Minutes Intravenous  Once 01/31/16 0835 01/31/16 1325      Medications: Scheduled Meds: . enoxaparin (LOVENOX) injection  40 mg Subcutaneous Q24H  . feeding supplement (ENSURE ENLIVE)  237 mL Oral  QID  . piperacillin-tazobactam (ZOSYN)  IV  3.375 g Intravenous Q8H  . sodium chloride  1,000 mL Intravenous Once  . vancomycin  1,000 mg Intravenous Q8H   Continuous Infusions: . dextrose 5 % and 0.9 % NaCl with KCl 20 mEq/L 100 mL/hr at 02/01/16 2046   PRN Meds:.acetaminophen **OR** acetaminophen, HYDROmorphone (DILAUDID) injection, ondansetron **OR** ondansetron (ZOFRAN) IV  Assessment/Plan: Patient Active Problem List   Diagnosis Date Noted  . Protein-calorie malnutrition, severe 11/24/2015  . Dehiscence of intestinal anastomosis 11/23/2015  . 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 Fever 11/22/2015  . Diarrhea 11/22/2015  . Unintentional weight change 11/22/2015  . Weakness 11/22/2015  . Nausea without vomiting 11/22/2015  . Hypoalbuminemia due to protein-calorie malnutrition ([place]) 11/22/2015  . 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 Iron deficiency anemia 11/22/2015  . Acute blood loss anemia 11/22/2015  . Genetic testing 06/26/2015  . Small bowel obstruction ([place]) 04/22/2015  . 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 Rectal cancer ([place]) 02/01/2014   Rectal inflammation with concern for osteomyelitis -continue IV abx -continue fulls and ensure today   LOS: 2 days   Mickeal Skinner, MD Pg# [phone] [facility]

## 2016-02-03 LAB — CBC
HEMATOCRIT: 26.1 % — AB (ref 39.0–52.0)
HEMOGLOBIN: 8.3 g/dL — AB (ref 13.0–17.0)
MCH: 24.3 pg — ABNORMAL LOW (ref 26.0–34.0)
MCHC: 31.8 g/dL (ref 30.0–36.0)
MCV: 76.3 fL — AB (ref 78.0–100.0)
PLATELETS: 285 10*3/uL (ref 150–400)
RBC: 3.42 MIL/uL — AB (ref 4.22–5.81)
RDW: 18.5 % — ABNORMAL HIGH (ref 11.5–15.5)
WBC: 5.8 10*3/uL (ref 4.0–10.5)

## 2016-02-03 LAB — SURGICAL PCR SCREEN
MRSA, PCR: NEGATIVE
Staphylococcus aureus: NEGATIVE

## 2016-02-03 MED ORDER — LOPERAMIDE HCL 2 MG PO CAPS
2.0000 mg | ORAL_CAPSULE | Freq: Once | ORAL | Status: AC
  Administered 2016-02-03: 2 mg via ORAL
  Filled 2016-02-03: qty 1

## 2016-02-03 MED ORDER — LOPERAMIDE HCL 2 MG PO CAPS
2.0000 mg | ORAL_CAPSULE | ORAL | Status: DC | PRN
Start: 2016-02-03 — End: 2016-02-07

## 2016-02-03 NOTE — Progress Notes (Signed)
Initial Nutrition Assessment  DOCUMENTATION CODES:   Severe malnutrition in context of chronic illness, Underweight  INTERVENTION:   Continue Ensure Enlive po QID, each supplement provides 350 kcal and 20 grams of protein Encourage PO intake RD to continue to monitor  NUTRITION DIAGNOSIS:   Increased nutrient needs related to cancer and cancer related treatments as evidenced by estimated needs.  GOAL:   Patient will meet greater than or equal to 90% of their needs  MONITOR:   PO intake, Supplement acceptance, Labs, Weight trends, I & O's  REASON FOR ASSESSMENT:   Other (Comment) (Low BMI)    ASSESSMENT:   35 year old male with history of distal rectal cancer s/p XRT and robotic assisted LAR, hand sewn colo anal anastomosis and diverting loop ileostomy 05/2014 by Dr. 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 The patient was admitted in April of 2017 at which time there was question of dehiscence of rectal anastomosis. He was managed with antibiotics. He then had an EUA and fistulotomy 12/26/15 by Dr. Marcello Moores.   Patient reports good appetite and he has been following a liquid diet. Pt states he is tired of eating broth. He states he drinks protein shakes at home and juices. Pt consuming ~50% of his meal trays. Pt has been ordered Ensure QID, he is trying to drink this amount each day.  Patient has colostomy surgery planned for tomorrow per surgery note. Per weight history, pt has lost 24 lb since December 2016 (15% wt loss x 7 months, significant for time frame).  Pt with moderate muscle and severe fat depletion.  Labs reviewed. Medications: D5 and .9% NaCl w/ KCl infusion at 100 ml/hr -provides 408 kcal  Diet Order:  Diet full liquid Room service appropriate?: Yes; Fluid consistency:: Thin Diet NPO time specified Except for: Sips with Meds  Skin:     Last BM:  6/26  Height:   Ht Readings from Last 1 Encounters:   01/31/16 6\' 2"  (1.88 m)    Weight:   Wt Readings from Last 1 Encounters:  01/31/16 141 lb 15.6 oz (64.4 kg)    Ideal Body Weight:  86.3 kg  BMI:  Body mass index is 18.22 kg/(m^2).  Estimated Nutritional Needs:   Kcal:  2000-2200  Protein:  100-110g  Fluid:  2.2L/day  EDUCATION NEEDS:   No education needs identified at this time  Clayton Bibles, MS, RD, LDN Pager: [phone] After Hours Pager: [phone]

## 2016-02-03 NOTE — Progress Notes (Signed)
Progress Note: General Surgery Service   Subjective: Tolerated liquids, feels better Objective: Vital signs in last 24 hours: Temp:  [98 F (36.7 C)-99.7 F (37.6 C)] 98.9 F (37.2 C) (06/26 0551) Pulse Rate:  [78-87] 78 (06/26 0551) Resp:  [16] 16 (06/26 0551) BP: (101-115)/(53-73) 101/53 mmHg (06/26 0551) SpO2:  [99 %-100 %] 99 % (06/26 0551) Last BM Date: 02/03/16  Intake/Output from previous day: 06/25 0701 - 06/26 0700 In: 2844.3 [P.O.:462; I.V.:1882.3; IV Piggyback:500] Out: -  Intake/Output this shift:    Lungs: CTAB  Cardiovascular: RRR  Abd: soft, NT, ND  Extremities: no edema  Neuro: AOx4  Lab Results: CBC   Recent Labs  02/02/16 0950 02/03/16 0545  WBC 6.5 5.8  HGB 8.3* 8.3*  HCT 25.8* 26.1*  PLT 306 285   BMET  Recent Labs  02/01/16 0600  NA 136  K 3.8  CL 106  CO2 27  GLUCOSE 108*  BUN 6  CREATININE 0.76  CALCIUM 7.9*   PT/INR No results for input(s): LABPROT, INR in the last 72 hours. ABG No results for input(s): PHART, HCO3 in the last 72 hours.  Invalid input(s): PCO2, PO2  Studies/Results:  Anti-infectives: Anti-infectives    Start     Dose/Rate Route Frequency Ordered Stop   01/31/16 1400  piperacillin-tazobactam (ZOSYN) IVPB 3.375 g     3.375 g 12.5 mL/hr over 240 Minutes Intravenous Every 8 hours 01/31/16 1012     01/31/16 1200  vancomycin (VANCOCIN) IVPB 1000 mg/200 mL premix     1,000 mg 200 mL/hr over 60 Minutes Intravenous Every 8 hours 01/31/16 1145     01/31/16 0845  piperacillin-tazobactam (ZOSYN) IVPB 3.375 g     3.375 g 12.5 mL/hr over 240 Minutes Intravenous  Once 01/31/16 0835 01/31/16 1325      Medications: Scheduled Meds: . enoxaparin (LOVENOX) injection  40 mg Subcutaneous Q24H  . feeding supplement (ENSURE ENLIVE)  237 mL Oral QID  . piperacillin-tazobactam (ZOSYN)  IV  3.375 g Intravenous Q8H  . sodium chloride  1,000 mL Intravenous Once  . vancomycin  1,000 mg Intravenous Q8H   Continuous  Infusions: . dextrose 5 % and 0.9 % NaCl with KCl 20 mEq/L 100 mL/hr at 02/02/16 2039   PRN Meds:.acetaminophen **OR** acetaminophen, HYDROmorphone (DILAUDID) injection, loperamide, ondansetron **OR** ondansetron (ZOFRAN) IV  Assessment/Plan: Patient Active Problem List   Diagnosis Date Noted  . Protein-calorie malnutrition, severe 11/24/2015  . Dehiscence of intestinal anastomosis 11/23/2015  . 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 Fever 11/22/2015  . Diarrhea 11/22/2015  . Unintentional weight change 11/22/2015  . Weakness 11/22/2015  . Nausea without vomiting 11/22/2015  . Hypoalbuminemia due to protein-calorie malnutrition ([place]) 11/22/2015  . 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 Iron deficiency anemia 11/22/2015  . Acute blood loss anemia 11/22/2015  . Genetic testing 06/26/2015  . Small bowel obstruction ([place]) 04/22/2015  . 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 Rectal cancer ([place]) 02/01/2014   Pelvic abscess -continue IV abx -continue fulls and ensure today  We had a long discussion today.  His anastomosis is not healing and his pelvic abscess is worse.  I don't think there is any other choice but to take down his anastomosis and give him a permanent colostomy.  I think it is reasonable to proceed with this tomorrow.  We dicussed this in detail.  All questions were answered.  I will have the ostomy RN mark him today.  LOS: 3 days   [name]., MD

## 2016-02-03 NOTE — Clinical Documentation Improvement (Signed)
General Surgery  Can the diagnosis of   anemia be further specified?   Acute Blood Loss Anemia, including the suspected or known cause or associated condition(s)  Acute on chronic blood loss anemia, including the suspected or known cause or associated condition(s)  Chronic blood loss anemia, including the suspected or known cause or associated condition(s)  Precipitous drop in Hematocrit, including the suspected or known cause or associated condition(s)  Other  Clinically Undetermined  Document any associated diagnoses/conditions. Supporting Information: Component     Latest Ref Rng 02/01/2016 02/02/2016 02/03/2016            RBC     4.22 - 5.81 MIL/uL 2.85 (L) 3.48 (L) 3.42 (L)  Hemoglobin     13.0 - 17.0 g/dL 6.6 (LL) 8.3 (L) 8.3 (L)  HCT     39.0 - 52.0 % 21.4 (L) 25.8 (L) 26.1 (L)  02/01/16  Hemoglobin drop, likely chronic anemia exposed with resuscitation yesterday -2 units pRBCs   Please exercise your independent, professional judgment when responding. A specific answer is not anticipated or expected. Please update your documentation within the medical record to reflect your response to this query. Thank you   Thank You,  [place] [phone]

## 2016-02-03 NOTE — Consult Note (Addendum)
WOC ostomy consult note [place] nurse requested for preoperative stoma site marking by Dr. Marcello Moores.  Surgical procedure planned for tomorrow.  Permanent colostomy.  Discussed surgical procedure and stoma creation with patient and family.  Patient is well known to our service from previous ileostomy and surgical procedures since then.  Offered the patient and family educational booklet and they declined until post operative period.  Answered patient and family questions, primarily about colostomy irrigation.  He is very interested in pursuing this procedure when allowed by surgery to do so.  Our team is available for instruction if and when the MD allows.   Examined patient lying, sitting, and standing in order to place the marking in the patient's visual field, away from any creases or abdominal contour issues and within the rectus muscle.    Marked for colostomy in the LLQ  6.5 cm to the left of the umbilicus and  1.5 cm below the umbilicus.  Patient's abdomen cleansed with CHG wipes at site markings, allowed to air dry prior to marking. Marking is made with a surgical skin marking pen. Covered mark with thin film transparent dressing.   [place] nursing team will follow, and will remain available to this patient, the nursing, surgical and medical teams.   Thanks, Maudie Flakes, MSN, RN, [place], Arther Abbott  Pager# [phone]

## 2016-02-04 ENCOUNTER — Encounter (HOSPITAL_COMMUNITY): Admission: EM | Disposition: A | Discharge status: Home Health | Payer: Self-pay | Source: Home / Self Care

## 2016-02-04 ENCOUNTER — Inpatient Hospital Stay (HOSPITAL_COMMUNITY): Payer: 59

## 2016-02-04 ENCOUNTER — Inpatient Hospital Stay (HOSPITAL_COMMUNITY): Payer: 59 | Admitting: Certified Registered Nurse Anesthetist

## 2016-02-04 ENCOUNTER — Encounter (HOSPITAL_COMMUNITY): Payer: Self-pay | Admitting: General Surgery

## 2016-02-04 DIAGNOSIS — Z933 Colostomy status: Secondary | ICD-10-CM

## 2016-02-04 HISTORY — PX: CYSTOSCOPY W/ URETERAL STENT PLACEMENT: SHX1429

## 2016-02-04 HISTORY — PX: COLOSTOMY: SHX63

## 2016-02-04 HISTORY — DX: Colostomy status: Z93.3

## 2016-02-04 SURGERY — CREATION, COLOSTOMY
Anesthesia: General | Site: Ureter

## 2016-02-04 MED ORDER — FLUORESCEIN SODIUM 10 % IV SOLN
500.0000 mg | Freq: Once | INTRAVENOUS | Status: DC
  Filled 2016-02-04: qty 5

## 2016-02-04 MED ORDER — LIDOCAINE HCL (CARDIAC) 20 MG/ML IV SOLN
INTRAVENOUS | Status: DC | PRN
  Administered 2016-02-04: 50 mg via INTRAVENOUS

## 2016-02-04 MED ORDER — MIDAZOLAM HCL 2 MG/2ML IJ SOLN
INTRAMUSCULAR | Status: AC
  Filled 2016-02-04: qty 2

## 2016-02-04 MED ORDER — PROPOFOL 10 MG/ML IV BOLUS
INTRAVENOUS | Status: DC | PRN
  Administered 2016-02-04: 200 mg via INTRAVENOUS

## 2016-02-04 MED ORDER — FENTANYL CITRATE (PF) 250 MCG/5ML IJ SOLN
INTRAMUSCULAR | Status: AC
  Filled 2016-02-04: qty 5

## 2016-02-04 MED ORDER — HYDROMORPHONE HCL 1 MG/ML IJ SOLN
INTRAMUSCULAR | Status: AC
  Administered 2016-02-04: 0.5 mg via INTRAVENOUS
  Filled 2016-02-04: qty 1

## 2016-02-04 MED ORDER — PHENYLEPHRINE HCL 10 MG/ML IJ SOLN
INTRAMUSCULAR | Status: DC | PRN
  Administered 2016-02-04 (×3): 80 ug via INTRAVENOUS
  Administered 2016-02-04: 100 ug via INTRAVENOUS
  Administered 2016-02-04: 80 ug via INTRAVENOUS

## 2016-02-04 MED ORDER — ONDANSETRON HCL 4 MG/2ML IJ SOLN
INTRAMUSCULAR | Status: AC
  Filled 2016-02-04: qty 2

## 2016-02-04 MED ORDER — IOHEXOL 300 MG/ML  SOLN
INTRAMUSCULAR | Status: DC | PRN
  Administered 2016-02-04: 6 mL via URETHRAL

## 2016-02-04 MED ORDER — DEXAMETHASONE SODIUM PHOSPHATE 10 MG/ML IJ SOLN
INTRAMUSCULAR | Status: DC | PRN
  Administered 2016-02-04: 6 mg via INTRAVENOUS

## 2016-02-04 MED ORDER — KCL IN DEXTROSE-NACL 20-5-0.9 MEQ/L-%-% IV SOLN
INTRAVENOUS | Status: DC
  Administered 2016-02-04: 16:00:00 via INTRAVENOUS
  Administered 2016-02-05: 100 mL/h via INTRAVENOUS
  Filled 2016-02-04 (×5): qty 1000

## 2016-02-04 MED ORDER — KCL IN DEXTROSE-NACL 20-5-0.45 MEQ/L-%-% IV SOLN
INTRAVENOUS | Status: AC
  Filled 2016-02-04: qty 1000

## 2016-02-04 MED ORDER — ROCURONIUM BROMIDE 100 MG/10ML IV SOLN
INTRAVENOUS | Status: DC | PRN
  Administered 2016-02-04: 50 mg via INTRAVENOUS
  Administered 2016-02-04: 10 mg via INTRAVENOUS

## 2016-02-04 MED ORDER — SUGAMMADEX SODIUM 200 MG/2ML IV SOLN
INTRAVENOUS | Status: DC | PRN
  Administered 2016-02-04: 200 mg via INTRAVENOUS

## 2016-02-04 MED ORDER — FENTANYL CITRATE (PF) 100 MCG/2ML IJ SOLN
INTRAMUSCULAR | Status: AC
  Filled 2016-02-04: qty 2

## 2016-02-04 MED ORDER — HYDROMORPHONE HCL 1 MG/ML IJ SOLN
0.2500 mg | INTRAMUSCULAR | Status: DC | PRN
  Administered 2016-02-04 (×3): 0.5 mg via INTRAVENOUS
  Administered 2016-02-04 (×2): 0.25 mg via INTRAVENOUS

## 2016-02-04 MED ORDER — LACTATED RINGERS IV SOLN
INTRAVENOUS | Status: DC
  Administered 2016-02-04 (×2): via INTRAVENOUS
  Administered 2016-02-04: 1000 mL via INTRAVENOUS

## 2016-02-04 MED ORDER — FENTANYL CITRATE (PF) 100 MCG/2ML IJ SOLN
INTRAMUSCULAR | Status: DC | PRN
  Administered 2016-02-04: 100 ug via INTRAVENOUS
  Administered 2016-02-04: 50 ug via INTRAVENOUS
  Administered 2016-02-04: 100 ug via INTRAVENOUS
  Administered 2016-02-04 (×2): 50 ug via INTRAVENOUS

## 2016-02-04 MED ORDER — PHENYLEPHRINE 40 MCG/ML (10ML) SYRINGE FOR IV PUSH (FOR BLOOD PRESSURE SUPPORT)
PREFILLED_SYRINGE | INTRAVENOUS | Status: AC
  Filled 2016-02-04: qty 10

## 2016-02-04 MED ORDER — HYDROMORPHONE HCL 1 MG/ML IJ SOLN
INTRAMUSCULAR | Status: AC
  Administered 2016-02-04: 0.25 mg via INTRAVENOUS
  Filled 2016-02-04: qty 1

## 2016-02-04 MED ORDER — 0.9 % SODIUM CHLORIDE (POUR BTL) OPTIME
TOPICAL | Status: DC | PRN
  Administered 2016-02-04: 1000 mL

## 2016-02-04 MED ORDER — DEXAMETHASONE SODIUM PHOSPHATE 10 MG/ML IJ SOLN
INTRAMUSCULAR | Status: AC
  Filled 2016-02-04: qty 2

## 2016-02-04 MED ORDER — MIDAZOLAM HCL 5 MG/5ML IJ SOLN
INTRAMUSCULAR | Status: DC | PRN
  Administered 2016-02-04: 2 mg via INTRAVENOUS

## 2016-02-04 MED ORDER — 0.9 % SODIUM CHLORIDE (POUR BTL) OPTIME
TOPICAL | Status: DC | PRN
  Administered 2016-02-04: 2000 mL

## 2016-02-04 MED ORDER — MORPHINE SULFATE (PF) 2 MG/ML IV SOLN
2.0000 mg | INTRAVENOUS | Status: DC | PRN
  Administered 2016-02-04: 2 mg via INTRAVENOUS
  Administered 2016-02-04: 4 mg via INTRAVENOUS
  Administered 2016-02-04 – 2016-02-05 (×2): 2 mg via INTRAVENOUS
  Administered 2016-02-05: 4 mg via INTRAVENOUS
  Administered 2016-02-05: 2 mg via INTRAVENOUS
  Filled 2016-02-04: qty 2
  Filled 2016-02-04 (×2): qty 1
  Filled 2016-02-04: qty 2
  Filled 2016-02-04 (×2): qty 1

## 2016-02-04 SURGICAL SUPPLY — 66 items
BAG URO CATCHER STRL LF (MISCELLANEOUS) ×3 IMPLANT
BASKET DAKOTA 1.9FR 11X120 (BASKET) IMPLANT
BASKET ZERO TIP NITINOL 2.4FR (BASKET) IMPLANT
BLADE EXTENDED COATED 6.5IN (ELECTRODE) ×6 IMPLANT
BLADE HEX COATED 2.75 (ELECTRODE) ×6 IMPLANT
CATH URET 5FR 28IN OPEN ENDED (CATHETERS) ×3 IMPLANT
CELLS DAT CNTRL 66122 CELL SVR (MISCELLANEOUS) ×2 IMPLANT
CHLORAPREP W/TINT 26ML (MISCELLANEOUS) ×3 IMPLANT
CLOTH BEACON ORANGE TIMEOUT ST (SAFETY) IMPLANT
COVER MAYO STAND STRL (DRAPES) ×3 IMPLANT
COVER SURGICAL LIGHT HANDLE (MISCELLANEOUS) ×3 IMPLANT
DRAIN CHANNEL 19F RND (DRAIN) ×3 IMPLANT
DRAPE LAPAROSCOPIC ABDOMINAL (DRAPES) ×3 IMPLANT
DRAPE SHEET LG 3/4 BI-LAMINATE (DRAPES) IMPLANT
DRAPE WARM FLUID 44X44 (DRAPE) ×3 IMPLANT
DRSG OPSITE POSTOP 4X6 (GAUZE/BANDAGES/DRESSINGS) ×3 IMPLANT
ELECT REM PT RETURN 9FT ADLT (ELECTROSURGICAL) ×3
ELECTRODE REM PT RTRN 9FT ADLT (ELECTROSURGICAL) ×2 IMPLANT
EVACUATOR DRAINAGE 10X20 100CC (DRAIN) ×2 IMPLANT
EVACUATOR SILICONE 100CC (DRAIN) ×1
GAUZE SPONGE 4X4 12PLY STRL (GAUZE/BANDAGES/DRESSINGS) IMPLANT
GLOVE BIO SURGEON STRL SZ 6.5 (GLOVE) ×3 IMPLANT
GLOVE BIOGEL M STRL SZ7.5 (GLOVE) ×3 IMPLANT
GLOVE BIOGEL PI IND STRL 6.5 (GLOVE) ×4 IMPLANT
GLOVE BIOGEL PI IND STRL 7.0 (GLOVE) ×6 IMPLANT
GLOVE BIOGEL PI INDICATOR 6.5 (GLOVE) ×2
GLOVE BIOGEL PI INDICATOR 7.0 (GLOVE) ×3
GOWN STRL REIN 2XL LVL4 (GOWN DISPOSABLE) ×6 IMPLANT
GOWN STRL REUS W/TWL XL LVL3 (GOWN DISPOSABLE) ×3 IMPLANT
GUIDEWIRE ANG ZIPWIRE 038X150 (WIRE) IMPLANT
GUIDEWIRE STR DUAL SENSOR (WIRE) ×3 IMPLANT
HANDLE SUCTION POOLE (INSTRUMENTS) ×2 IMPLANT
KIT BASIN OR (CUSTOM PROCEDURE TRAY) ×3 IMPLANT
LEGGING LITHOTOMY PAIR STRL (DRAPES) IMPLANT
LIGASURE IMPACT 36 18CM CVD LR (INSTRUMENTS) IMPLANT
MANIFOLD NEPTUNE II (INSTRUMENTS) ×3 IMPLANT
NS IRRIG 1000ML POUR BTL (IV SOLUTION) ×6 IMPLANT
PACK CYSTO (CUSTOM PROCEDURE TRAY) ×3 IMPLANT
PACK GENERAL/GYN (CUSTOM PROCEDURE TRAY) ×3 IMPLANT
RTRCTR WOUND ALEXIS 18CM MED (MISCELLANEOUS) ×3
SHEATH ACCESS URETERAL 24CM (SHEATH) IMPLANT
SHEATH ACCESS URETERAL 38CM (SHEATH) IMPLANT
SHEATH ACCESS URETERAL 54CM (SHEATH) IMPLANT
SPONGE DRAIN TRACH 4X4 STRL 2S (GAUZE/BANDAGES/DRESSINGS) ×3 IMPLANT
SPONGE LAP 18X18 X RAY DECT (DISPOSABLE) IMPLANT
STAPLER PROXIMATE 75MM BLUE (STAPLE) ×3 IMPLANT
STAPLER VISISTAT 35W (STAPLE) IMPLANT
STENT CONTOUR 6FRX28X.038 (STENTS) ×3 IMPLANT
SUCTION POOLE HANDLE (INSTRUMENTS) ×3
SUT NYLON 3 0 (SUTURE) ×3 IMPLANT
SUT PDS AB 1 CTX 36 (SUTURE) IMPLANT
SUT PDS AB 1 TP1 96 (SUTURE) ×6 IMPLANT
SUT PROLENE 2 0 BLUE (SUTURE) IMPLANT
SUT SILK 2 0 (SUTURE)
SUT SILK 2 0 SH CR/8 (SUTURE) ×3 IMPLANT
SUT SILK 2 0SH CR/8 30 (SUTURE) ×3 IMPLANT
SUT SILK 2-0 18XBRD TIE 12 (SUTURE) IMPLANT
SUT SILK 2-0 30XBRD TIE 12 (SUTURE) IMPLANT
SUT SILK 3 0 (SUTURE) ×1
SUT SILK 3 0 SH CR/8 (SUTURE) IMPLANT
SUT SILK 3-0 18XBRD TIE 12 (SUTURE) ×2 IMPLANT
SUT VIC AB 2-0 SH 18 (SUTURE) ×3 IMPLANT
TOWEL OR 17X26 10 PK STRL BLUE (TOWEL DISPOSABLE) ×3 IMPLANT
TRAY FOLEY W/METER SILVER 14FR (SET/KITS/TRAYS/PACK) IMPLANT
TRAY FOLEY W/METER SILVER 16FR (SET/KITS/TRAYS/PACK) ×3 IMPLANT
TUBING CONNECTING 10 (TUBING) ×3 IMPLANT

## 2016-02-04 NOTE — Anesthesia Preprocedure Evaluation (Signed)
Anesthesia Evaluation  Patient identified by MRN, date of birth, ID band Patient awake    Reviewed: Allergy & Precautions, H&P , NPO status , Patient's Chart, lab work & pertinent test results  History of Anesthesia Complications Negative for: history of anesthetic complications  Airway Mallampati: I  TM Distance: >3 FB Neck ROM: Full    Dental no notable dental hx. (+) Teeth Intact, Dental Advisory Given   Pulmonary former smoker,    Pulmonary exam normal breath sounds clear to auscultation       Cardiovascular negative cardio ROS Normal cardiovascular exam Rhythm:Regular Rate:Normal     Neuro/Psych PSYCHIATRIC DISORDERS Anxiety negative neurological ROS     GI/Hepatic Neg liver ROS, Hx of rectal cancer stage 3   Endo/Other  negative endocrine ROS  Renal/GU negative Renal ROS  negative genitourinary   Musculoskeletal negative musculoskeletal ROS (+)   Abdominal   Peds negative pediatric ROS (+)  Hematology negative hematology ROS (+) anemia ,   Anesthesia Other Findings   Reproductive/Obstetrics negative OB ROS                             Anesthesia Physical  Anesthesia Plan  ASA: II  Anesthesia Plan: General   Post-op Pain Management:    Induction: Intravenous  Airway Management Planned: Oral ETT  Additional Equipment:   Intra-op Plan:   Post-operative Plan: Extubation in OR  Informed Consent: I have reviewed the patients History and Physical, chart, labs and discussed the procedure including the risks, benefits and alternatives for the proposed anesthesia with the patient or authorized representative who has indicated his/her understanding and acceptance.   Dental advisory given  Plan Discussed with: CRNA and Surgeon  Anesthesia Plan Comments:         Anesthesia Quick Evaluation                                  Anesthesia Evaluation  Patient identified by  MRN, date of birth, ID band Patient awake    Reviewed: Allergy & Precautions, H&P , NPO status , Patient's Chart, lab work & pertinent test results  History of Anesthesia Complications Negative for: history of anesthetic complications  Airway Mallampati: I  TM Distance: >3 FB Neck ROM: Full    Dental no notable dental hx. (+) Teeth Intact, Dental Advisory Given   Pulmonary former smoker,    Pulmonary exam normal breath sounds clear to auscultation       Cardiovascular negative cardio ROS Normal cardiovascular exam Rhythm:Regular Rate:Normal     Neuro/Psych PSYCHIATRIC DISORDERS Anxiety negative neurological ROS     GI/Hepatic Neg liver ROS, Hx of rectal cancer stage 3   Endo/Other  negative endocrine ROS  Renal/GU negative Renal ROS  negative genitourinary   Musculoskeletal negative musculoskeletal ROS (+)   Abdominal   Peds negative pediatric ROS (+)  Hematology negative hematology ROS (+)   Anesthesia Other Findings   Reproductive/Obstetrics negative OB ROS                            Anesthesia Physical Anesthesia Plan  ASA: II  Anesthesia Plan: General   Post-op Pain Management:    Induction: Intravenous  Airway Management Planned: Oral ETT  Additional Equipment:   Intra-op Plan:   Post-operative Plan: Extubation in OR  Informed Consent: I have reviewed the  patients History and Physical, chart, labs and discussed the procedure including the risks, benefits and alternatives for the proposed anesthesia with the patient or authorized representative who has indicated his/her understanding and acceptance.   Dental advisory given  Plan Discussed with: CRNA and Surgeon  Anesthesia Plan Comments:         Anesthesia Quick Evaluation

## 2016-02-04 NOTE — Progress Notes (Signed)
  Subjective: Anxious over surgery, and future colostomy.  Also worried about the drain.  Asking to shower.  He has IV thru his Port.    Objective: Vital signs in last 24 hours: Temp:  [98.2 F (36.8 C)-99.6 F (37.6 C)] 98.2 F (36.8 C) (06/27 0400) Pulse Rate:  [56-88] 56 (06/27 0400) Resp:  [16] 16 (06/27 0400) BP: (91-113)/(61-66) 91/61 mmHg (06/27 0400) SpO2:  [100 %] 100 % (06/27 0400) Last BM Date: 02/03/16 240 PO yesterday recorded BM x 5/voided x 6 Afebrile, VSS NO labs this AM NO films this AM Intake/Output from previous day: 06/26 0701 - 06/27 0700 In: 3290 [P.O.:240; I.V.:2300; IV Piggyback:750] Out: -  Intake/Output this shift:    General appearance: alert, cooperative and no distress Resp: clear to auscultation bilaterally Cardio: regular rate and rhythm, S1, S2 normal, no murmur, click, rub or gallop GI: soft, non-tender; bowel sounds normal; no masses,  no organomegaly and scars well healed, no distension, or tenderness.   Extremities: extremities normal, atraumatic, no cyanosis or edema  Lab Results:   Recent Labs  02/02/16 0950 02/03/16 0545  WBC 6.5 5.8  HGB 8.3* 8.3*  HCT 25.8* 26.1*  PLT 306 285    BMET No results for input(s): NA, K, CL, CO2, GLUCOSE, BUN, CREATININE, CALCIUM in the last 72 hours. PT/INR No results for input(s): LABPROT, INR in the last 72 hours.  No results for input(s): AST, ALT, ALKPHOS, BILITOT, PROT, ALBUMIN in the last 168 hours.   Lipase     Component Value Date/Time   LIPASE 19 11/17/2014 1958     Studies/Results: No results found.  Medications: . enoxaparin (LOVENOX) injection  40 mg Subcutaneous Q24H  . feeding supplement (ENSURE ENLIVE)  237 mL Oral QID  . piperacillin-tazobactam (ZOSYN)  IV  3.375 g Intravenous Q8H  . sodium chloride  1,000 mL Intravenous Once  . vancomycin  1,000 mg Intravenous Q8H   . 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 -hospitalized 11/2015 for disruption of rectal anastomosis and treated with antibiotics -EUA and fistulotomy 12/26/15   Assessment/Plan  Non healing anastomosis with pelvic abscess  FEN:  NPO/IV fluids ID:  Day 5 Zosyn/Vancomycin DVT:  Lovenox  Plan:  Surgery later this AM.      LOS: 4 days    Allisson Schindel 02/04/2016 [phone]

## 2016-02-04 NOTE — Anesthesia Postprocedure Evaluation (Signed)
Anesthesia Post Note  Patient: Seth Villanueva  Procedure(s) Performed: Procedure(s) (LRB): TAKEDOWN OF SURGICAL ANASTAMOSIS WITH COLOSTOMY AND PELVIC WASHOUT WITH LEFT URETERAL REPAIR (N/A) CYSTOSCOPY WITH LEFT RETROGRADE PYELOGRAM/URETERAL LEFT STENT PLACEMENT (Left)  Patient location during evaluation: PACU Anesthesia Type: General Level of consciousness: sedated Pain management: satisfactory to patient Vital Signs Assessment: post-procedure vital signs reviewed and stable Respiratory status: spontaneous breathing Cardiovascular status: stable Anesthetic complications: no    Last Vitals:  Filed Vitals:   02/04/16 1415 02/04/16 1430  BP: 119/73 112/66  Pulse: 63 70  Temp: 36.6 C   Resp: 10 10    Last Pain:  Filed Vitals:   02/04/16 1432  PainSc: 6                  Devorah Givhan EDWARD

## 2016-02-04 NOTE — Op Note (Signed)
Preoperative diagnosis:  1. Left ureteral injury   Postoperative diagnosis:  1. Same   Procedure: 1. Left ureteral repair with primary closure 2. Cystoscopy, left retrograde pyelogram with interpretation, left ureteral stent placement  Surgeon: Ardis Hughs, MD  Anesthesia: General  Complications: None  Intraoperative findings: The patient had a partial ureterotomy on the anterior portion this measured approximately 3 mm transversely. in the mid ureter just proximal to the iliac vessels. Inspection of the injured ureter demonstrated no significant cautery artifact or significant thermal injury. This was closed primarily. A retrograde pyelogram was then performed by injecting 10 mL of Omnipaque contrast into the left distal ureter which demonstrated a narrowing at the area of repair with mild contrast extravasation. A 6 French double-J ureteral stent was easily passed across the stenosed/injured region.  EBL: Please see Dr. Marcello Moores dictation for EBL  Specimens: None  Indication: Seth Villanueva is a 35 y.o. patient with a history of colon cancer and is status post resection of his cancer with primary anastomosis. However, the anastomosis broke down and the patient was taken back to the operating room for debridement and diversion of his bowel. In the midst of the dissection a ureteral injury was noted and urology was consult intraoperatively.  Description of procedure: The patient was under general anesthesia and had a small midline laparotomy incision. The dissection had been performed and the ureteral injury isolated. Using a 5-0 Vicryl suture I was able to reapproximate the edges of the cut ureter primarily. This was done in a running fashion. I then used a tongue of omentum and wrapped around the injured segment and then attached it to the surrounding area with a 3-0 Vicryl in interrupted fashion.  After the ureter had been closed and the omental flap positioned over top the  ureter Dr. Marcello Moores then closed the patient and matured the stoma. Please see her dictation for further details.  The patient was then repositioned in the dorsal lithotomy position and redraped. A timeout was then held for the additional procedure. I then advanced a 21 French rigid cystoscope through the patient's urethra and into the bladder under visual guidance. The bladder was noted to be edematous in the trigonal region consistent with a previous Foley catheter. There were no other significant bladder abnormalities. Ureteral orifices were orthotopic although difficult to identify within the bullous edema. I was able to cannulate the left ureter with a 5 [place] open-ended ureteral catheter and injected 10 mL of contrast into the patient's collecting system. The above findings were then noted. I then advanced a 0.38 sensor wire up the ureter and into the left kidney. I then removed the open-ended catheter and exchanged for a 6 French times 28 cm double-J stent. Using 4 scopic guidance and nice curl was noted in the patient's collecting system as well as in the bladder. The scope was then removed and a 16 French Foley catheter was then placed.  The patient was subsequently extubated and returned to the PACU in stable condition.  Ardis Hughs, M.D.

## 2016-02-04 NOTE — Op Note (Signed)
01/31/2016 - 02/04/2016  12:56 PM  PATIENT:  Seth Villanueva  35 y.o. male  Patient Care Team: Leonides Sake, MD as PCP - General (Family Medicine) Kyung Rudd, MD as Consulting Physician (Radiation Oncology) Ladell Pier, MD as Consulting Physician (Oncology) [name], MD as Consulting Physician (General Surgery)  PRE-OPERATIVE DIAGNOSIS:  PELVIC ABCESS  POST-OPERATIVE DIAGNOSIS:  PELVIC ABCESS  PROCEDURE:  Procedure(s): TAKEDOWN OF SURGICAL ANASTAMOSIS WITH COLOSTOMY AND PELVIC WASHOUT  LEFT URETERAL REPAIR WITH CYSTOSCOPY WITH LEFT RETROGRADE PYELOGRAM/URETERAL LEFT STENT PLACEMENT  Surgeon(s): [name], MD Ardis Hughs, MD  ASSISTANT: none   ANESTHESIA:   general  EBL: 74ml  Total I/O In: 2000 [I.V.:2000] Out: 395 [Urine:345; Blood:50]  DRAINS: (59F) Jackson-Pratt drain(s) with closed bulb suction in the pelvis   SPECIMEN:  Source of Specimen:  distal colon, pelvic nodule  DISPOSITION OF SPECIMEN:  PATHOLOGY  COUNTS:  YES  PLAN OF CARE: Admit to inpatient   PATIENT DISPOSITION:  PACU - hemodynamically stable.  INDICATION: 35 year old male status post coloanal anastomosis for distal rectal cancer after resection. He has spent the last couple years dealing with a distal colon, anastomotic stricture. The stricture has finally failed and he has developed a pelvic abscess. I recommended excision and permanent colostomy.   OR FINDINGS: Significant inflammation within the pelvis and around the left side of the pelvic sidewall. No signs of metastatic disease.  DESCRIPTION: the patient was identified in the preoperative holding area and taken to the OR where they were laid supine on the operating room table.  General anesthesia was induced without difficulty. SCDs were also noted to be in place prior to the initiation of anesthesia.  The patient was then prepped and draped in the usual sterile fashion.   A surgical timeout was performed indicating the  correct patient, procedure, positioning and need for preoperative antibiotics.   I began by making a lower midline incision using a scalpel. Dissection was carried down through subcutaneous tissues using electrocautery. The fascia was incised midline. The peritoneum was entered bluntly and divided vertically. An [place] wound protector was placed. The small bowel was packed out of the pelvis. There was a rind of inflammation along the anterior and left pelvic sidewall. I began to dissect the colon away from this rind using Metzenbaum scissors and blunt dissection. Upon entering into the posterior plane, a moderate sized abscess cavity was entered with frank stool noted. This was washed out. I bluntly mobilized the colon posteriorly. I then mobilized the anterior portion from the bladder neck.  I was able to bluntly mobilize the colon except for the posterior portion which was still adherent to the sacrum. I then terminated attention to the right lateral sidewall. A portion of small bowel was adherent to the posterior colon. This was mobilized using Metzenbaum scissors. I was able to free this from the pelvis. I inspected this and there was no sign of injury to the small bowel. This was also packed away. The right ureter appeared to be uninvolved in the inflammation. I then terminated attention to the portion of the colon that was stuck to the sacrum. This was dissected free using electrocautery. The colon was then lifted up and the remaining portion of the colon adherent to the left pelvic sidewall was freed. I then mobilized this to allow for colostomy formation. The end of the colon was stapled using a 65 mm GIA stapler.  This was then packed back into the abdomen. I inspected the pelvis.  I placed a lap sponge in the pelvis to pack this off for hemostasis. I evaluated the left ureter upon closer examination there was a small nick in the left ureter that was draining urine. I consult it Dr. Louis Meckel and he  evaluated the situation. We decided to close the ureter and place a stent on the right side after completion of the case. He performed the repair and placed omentum over this. I then tacked the omentum to the colostomy to prevent herniation of small bowel. The pelvis was irrigated with saline once again. Hemostasis was good. A 19 [place] Blake drain was placed in the pelvis and brought out through the right lower quadrant. The fascia was closed using 2 looped 0 PDS sutures. The subcutaneous tissue was loosely approximated using interrupted 2-0 Vicryl sutures. Umbilical wicks were placed within the wound and a dressing was applied. The colostomy was then matured in standard [place] formation using 2-0 Vicryl sutures. An ostomy appliance was applied. I then turned the case over to Dr. Louis Meckel who performed a cystoscopy and stent placement without difficulty. This will all be dictated separately. The patient was awakened from anesthesia and sent to the postanesthesia care unit in stable condition. All counts were correct per operating room staff.

## 2016-02-04 NOTE — Transfer of Care (Signed)
Immediate Anesthesia Transfer of Care Note  Patient: Seth Villanueva  Procedure(s) Performed: Procedure(s): TAKEDOWN OF SURGICAL ANASTAMOSIS WITH COLOSTOMY AND PELVIC WASHOUT WITH LEFT URETERAL REPAIR (N/A) CYSTOSCOPY WITH LEFT RETROGRADE PYELOGRAM/URETERAL LEFT STENT PLACEMENT (Left)  Patient Location: PACU  Anesthesia Type:General  Level of Consciousness: awake, alert  and oriented  Airway & Oxygen Therapy: Patient Spontanous Breathing and Patient connected to face mask oxygen  Post-op Assessment: Report given to RN and Post -op Vital signs reviewed and stable  Post vital signs: Reviewed and stable  Last Vitals:  Filed Vitals:   02/04/16 0400 02/04/16 0857  BP: 91/61 119/71  Pulse: 56 96  Temp: 36.8 C 37 C  Resp: 16 18    Last Pain:  Filed Vitals:   02/04/16 0941  PainSc: 4       Patients Stated Pain Goal: 2 ([id number])  Complications: No apparent anesthesia complications

## 2016-02-04 NOTE — Anesthesia Procedure Notes (Signed)
Procedure Name: Intubation Performed by: Gean Maidens Pre-anesthesia Checklist: Patient identified, Emergency Drugs available, Suction available, Patient being monitored and Timeout performed Patient Re-evaluated:Patient Re-evaluated prior to inductionOxygen Delivery Method: Circle system utilized Preoxygenation: Pre-oxygenation with 100% oxygen Intubation Type: IV induction Ventilation: Mask ventilation without difficulty Laryngoscope size: Bladeless intubation performed by Dr. Seward Speck. Grade View: Grade I Tube type: Oral Tube size: 7.5 mm Number of attempts: 1 Airway Equipment and Method: Stylet Placement Confirmation: positive ETCO2,  CO2 detector and breath sounds checked- equal and bilateral Secured at: 23 cm Tube secured with: Tape Dental Injury: Teeth and Oropharynx as per pre-operative assessment

## 2016-02-05 LAB — PREALBUMIN: PREALBUMIN: 6.7 mg/dL — AB (ref 18–38)

## 2016-02-05 LAB — TYPE AND SCREEN
ABO/RH(D): B POS
ANTIBODY SCREEN: NEGATIVE
UNIT DIVISION: 0
UNIT DIVISION: 0
Unit division: 0
Unit division: 0

## 2016-02-05 LAB — CULTURE, BLOOD (ROUTINE X 2)
Culture: NO GROWTH
Culture: NO GROWTH

## 2016-02-05 LAB — COMPREHENSIVE METABOLIC PANEL
ALBUMIN: 2.2 g/dL — AB (ref 3.5–5.0)
ALT: 14 U/L — AB (ref 17–63)
AST: 9 U/L — AB (ref 15–41)
Alkaline Phosphatase: 50 U/L (ref 38–126)
Anion gap: 4 — ABNORMAL LOW (ref 5–15)
BUN: 8 mg/dL (ref 6–20)
CHLORIDE: 109 mmol/L (ref 101–111)
CO2: 27 mmol/L (ref 22–32)
CREATININE: 1.11 mg/dL (ref 0.61–1.24)
Calcium: 8.1 mg/dL — ABNORMAL LOW (ref 8.9–10.3)
GFR calc Af Amer: >60 mL/min (ref >60)
GFR calc non Af Amer: >60 mL/min (ref >60)
GLUCOSE: 162 mg/dL — AB (ref 65–99)
POTASSIUM: 4.6 mmol/L (ref 3.5–5.1)
SODIUM: 140 mmol/L (ref 135–145)
Total Bilirubin: 0.2 mg/dL — ABNORMAL LOW (ref 0.3–1.2)
Total Protein: 5.7 g/dL — ABNORMAL LOW (ref 6.5–8.1)

## 2016-02-05 LAB — CBC
HCT: 26.8 % — ABNORMAL LOW (ref 39.0–52.0)
Hemoglobin: 8.5 g/dL — ABNORMAL LOW (ref 13.0–17.0)
MCH: 23.8 pg — ABNORMAL LOW (ref 26.0–34.0)
MCHC: 31.7 g/dL (ref 30.0–36.0)
MCV: 75.1 fL — AB (ref 78.0–100.0)
PLATELETS: 336 10*3/uL (ref 150–400)
RBC: 3.57 MIL/uL — AB (ref 4.22–5.81)
RDW: 18.5 % — AB (ref 11.5–15.5)
WBC: 13.3 10*3/uL — AB (ref 4.0–10.5)

## 2016-02-05 LAB — OCCULT BLOOD GASTRIC / DUODENUM (SPECIMEN CUP)
OCCULT BLOOD, GASTRIC: POSITIVE — AB
pH, Gastric: 2

## 2016-02-05 MED ORDER — DIPHENHYDRAMINE HCL 12.5 MG/5ML PO ELIX: 12.5000 mg | ORAL_SOLUTION | Freq: Four times a day (QID) | ORAL | Status: DC | PRN

## 2016-02-05 MED ORDER — ACETAMINOPHEN 10 MG/ML IV SOLN
1000.0000 mg | Freq: Four times a day (QID) | INTRAVENOUS | Status: AC
  Administered 2016-02-05 – 2016-02-06 (×4): 1000 mg via INTRAVENOUS
  Filled 2016-02-05 (×6): qty 100

## 2016-02-05 MED ORDER — SODIUM CHLORIDE 0.9% FLUSH
10.0000 mL | Freq: Two times a day (BID) | INTRAVENOUS | Status: DC
  Administered 2016-02-05 – 2016-02-11 (×7): 10 mL

## 2016-02-05 MED ORDER — HYDROMORPHONE 1 MG/ML IV SOLN
INTRAVENOUS | Status: DC
Start: 2016-02-05 — End: 2016-02-10
  Administered 2016-02-05: 09:00:00 via INTRAVENOUS
  Administered 2016-02-05: 2.1 mg via INTRAVENOUS
  Administered 2016-02-05 – 2016-02-06 (×2): 1.8 mg via INTRAVENOUS
  Administered 2016-02-06: 2.7 mg via INTRAVENOUS
  Administered 2016-02-06: 0.6 mg via INTRAVENOUS
  Administered 2016-02-07: 3 mg via INTRAVENOUS
  Administered 2016-02-07: 0.9 mg via INTRAVENOUS
  Administered 2016-02-07: 1.8 mg via INTRAVENOUS
  Administered 2016-02-07: 2.1 mg via INTRAVENOUS
  Administered 2016-02-08: 3 mg via INTRAVENOUS
  Administered 2016-02-08: 1.8 mg via INTRAVENOUS
  Administered 2016-02-08: 2.1 mg via INTRAVENOUS
  Administered 2016-02-08: 1.8 mg via INTRAVENOUS
  Administered 2016-02-08: 0.6 mg via INTRAVENOUS
  Administered 2016-02-08: 1.8 mg via INTRAVENOUS
  Administered 2016-02-09 (×2): 1.2 mg via INTRAVENOUS
  Administered 2016-02-09: 1.5 mg via INTRAVENOUS
  Administered 2016-02-09: 0.9 mg via INTRAVENOUS
  Administered 2016-02-09: 1.8 mg via INTRAVENOUS
  Administered 2016-02-09: 0.6 mg via INTRAVENOUS
  Administered 2016-02-10: 0.9 mg via INTRAVENOUS
  Administered 2016-02-10 (×2): 1.2 mg via INTRAVENOUS
  Filled 2016-02-05 (×3): qty 25

## 2016-02-05 MED ORDER — NALOXONE HCL 0.4 MG/ML IJ SOLN: 0.4000 mg | INTRAMUSCULAR | Status: DC | PRN

## 2016-02-05 MED ORDER — SODIUM CHLORIDE 0.9% FLUSH
10.0000 mL | INTRAVENOUS | Status: DC | PRN
  Administered 2016-02-05 – 2016-02-06 (×2): 10 mL
  Administered 2016-02-06: 20 mL
  Administered 2016-02-09: 10 mL
  Filled 2016-02-05 (×4): qty 40

## 2016-02-05 MED ORDER — MENTHOL 3 MG MT LOZG
1.0000 | LOZENGE | OROMUCOSAL | Status: DC | PRN
  Administered 2016-02-05: 3 mg via ORAL
  Filled 2016-02-05: qty 9

## 2016-02-05 MED ORDER — FAMOTIDINE IN NACL 20-0.9 MG/50ML-% IV SOLN
20.0000 mg | Freq: Two times a day (BID) | INTRAVENOUS | Status: DC
  Administered 2016-02-05 – 2016-02-09 (×10): 20 mg via INTRAVENOUS
  Filled 2016-02-05 (×12): qty 50

## 2016-02-05 MED ORDER — SODIUM CHLORIDE 0.9% FLUSH: 9.0000 mL | INTRAVENOUS | Status: DC | PRN

## 2016-02-05 MED ORDER — HYDROMORPHONE HCL 2 MG/ML IJ SOLN: 1.5000 mg | Freq: Once | INTRAMUSCULAR | Status: DC

## 2016-02-05 MED ORDER — ONDANSETRON HCL 4 MG/2ML IJ SOLN: 4.0000 mg | Freq: Four times a day (QID) | INTRAMUSCULAR | Status: DC | PRN

## 2016-02-05 MED ORDER — DIPHENHYDRAMINE HCL 50 MG/ML IJ SOLN: 12.5000 mg | Freq: Four times a day (QID) | INTRAMUSCULAR | Status: DC | PRN

## 2016-02-05 MED ORDER — HYDROMORPHONE HCL 4 MG/ML IJ SOLN: 105.0000 mg | Freq: Once | INTRAMUSCULAR | Status: DC

## 2016-02-05 NOTE — Consult Note (Addendum)
WOC ostomy consult note Stoma type/location: LLQ colostomy (Dr. Marcello Moores, 02/04/16). Discussed with Dr. Marcello Moores and pouch and surgical dressing will be changed tomorrow-48 hours post op. Ideally I can coordinate with Modena Jansky, PA. Stomal assessment/size: Visualized through pouching system, first post op pouch change will be tomorrow. Stoma is pink, round, elevated, moist, edematous.  Approximately 1 and 1/2 inches. Peristomal assessment: Not seen today Treatment options for stomal/peristomal skin: Not seen today Output None Ostomy pouching: 1pc. Post op pouch is intact Education provided: None Enrolled patient in [place] program: No [place] nursing team will follow, and will remain available to this patient, the nursing, surgical and medical teams.   Thanks, Maudie Flakes, MSN, RN, [place], Arther Abbott  Pager# [phone]

## 2016-02-05 NOTE — Progress Notes (Signed)
Transferred to [place], report given to Cristine Polio, RN

## 2016-02-05 NOTE — Progress Notes (Signed)
S/p left ureterotomy closure and left ureteral stent placement.  S:  No issues overnight.  Sore, but pain controlled.  Filed Vitals:   02/04/16 1708 02/04/16 1815 02/04/16 2122 02/05/16 0229  BP: 106/57 106/68 97/72 112/55  Pulse: 67 67 79 66  Temp: 97.8 F (36.6 C) 98 F (36.7 C) 97.9 F (36.6 C) 98 F (36.7 C)  TempSrc: Oral Oral Oral Oral  Resp: 16 16 16 16   Height:      Weight:      SpO2: 100% 100% 100% 100%    Intake/Output Summary (Last 24 hours) at 02/05/16 0640 Last data filed at 02/05/16 [id number]  Gross per 24 hour  Intake 2611.66 ml  Output   3037 ml  Net -425.34 ml   NAD Abdomen is soft JP is draining serosanginous fluid Foley is draining straw colored urine   Recent Labs  02/02/16 0950 02/03/16 0545 02/05/16 0530  WBC 6.5 5.8 13.3*  HGB 8.3* 8.3* 8.5*  HCT 25.8* 26.1* 26.8*    Recent Labs  02/05/16 0530  NA 140  K 4.6  CL 109  CO2 27  GLUCOSE 162*  BUN 8  CREATININE 1.11  CALCIUM 8.1*   No results for input(s): LABPT, INR in the last 72 hours. No results for input(s): PSA in the last 72 hours. No results for input(s): LABURIN in the last 72 hours. Results for orders placed or performed during the hospital encounter of 01/31/16  Culture, blood (Routine X 2) w Reflex to ID Panel     Status: None (Preliminary result)   Collection Time: 01/31/16  9:32 AM  Result Value Ref Range Status   Specimen Description BLOOD PORTA CATH  Final   Special Requests BOTTLES DRAWN AEROBIC AND ANAEROBIC 5ML  Final   Culture   Final    NO GROWTH 4 DAYS Performed at [facility]    Report Status PENDING  Incomplete  Culture, blood (Routine X 2) w Reflex to ID Panel     Status: None (Preliminary result)   Collection Time: 01/31/16  9:44 AM  Result Value Ref Range Status   Specimen Description BLOOD LEFT ANTECUBITAL  Final   Special Requests BOTTLES DRAWN AEROBIC AND ANAEROBIC 5CC  Final   Culture   Final    NO GROWTH 4 DAYS Performed at [facility]    Report Status PENDING  Incomplete  Surgical pcr screen     Status: None   Collection Time: 02/03/16  3:20 PM  Result Value Ref Range Status   MRSA, PCR NEGATIVE NEGATIVE Final   Staphylococcus aureus NEGATIVE NEGATIVE Final    Comment:        The Xpert SA Assay (FDA approved for NASAL specimens in patients over 8 years of age), is one component of a comprehensive surveillance program.  Test performance has been validated by [facility] for patients greater than or equal to 68 year old. It is not intended to diagnose infection nor to guide or monitor treatment.      Imp: s/p left ureteral repair with left stent placement Rec:  D/c foley per Gen Surgery Please send JP for creatinine prior to removal - would not send it until it is ready to be removed. Will schedule patient for stent removal in 4-6 weeks

## 2016-02-05 NOTE — Clinical Documentation Improvement (Signed)
  General Surgery  Nutritional consult with Severe malnutrition in context of chronic illness, Underweight  Based on your medical judgment, can you clarify any of the condition listed.   Document Severity - Severe(third degree), Moderate (second degree), Mild (first degree)  Other condition  Unable to clinically determine  Document any associated diagnoses/conditions Supporting Information: :  02/03/16: Nutritional Management weight history, pt has lost 24 lb since December 2016 (15% wt loss x 7 months, significant for time frame).  Pt with moderate muscle and severe fat depletion. BMI: 18.22  Please exercise your independent, professional judgment when responding. A specific answer is not anticipated or expected. Please update your documentation within the medical record to reflect your response to this query. Thank you  Thank You, [place] [phone]

## 2016-02-05 NOTE — Progress Notes (Signed)
1 Day Post-Op  Subjective: He is really tender, even laying stethoscope on his abdomen is extremely painful.  The NG cannister is half full so he is putting out more than recorded.  Drainage looks dark and I will check for occult blood this AM.  Dressing is a little bloody, but it is under the ostomy adhesive so I will leave for now.  Nothing in the ostomy.   Objective: Vital signs in last 24 hours: Temp:  [97.6 F (36.4 C)-98.6 F (37 C)] 98 F (36.7 C) (06/28 0647) Pulse Rate:  [63-96] 64 (06/28 0647) Resp:  [10-18] 16 (06/28 0647) BP: (97-137)/(55-92) 109/64 mmHg (06/28 0647) SpO2:  [100 %] 100 % (06/28 0647) Last BM Date: 02/03/16 NPO IV 2561 Urine 3155 NG 220 Drain 332 Afebrile, VSS Labs OK WBC is up some  H/H is low but stable  Intake/Output from previous day: 06/27 0701 - 06/28 0700 In: 2611.7 [I.V.:2561.7; IV Piggyback:50] Out: [id number] [Urine:3155; Emesis/NG output:220; Drains:332; Blood:50] Intake/Output this shift:    General appearance: alert, cooperative and no distress Resp: clear to auscultation bilaterally GI: soft, having allot of pain, sites OK, nothing in the ostomy bag.  No BS  Drainage looks bloody   Lab Results:   Recent Labs  02/03/16 0545 02/05/16 0530  WBC 5.8 13.3*  HGB 8.3* 8.5*  HCT 26.1* 26.8*  PLT 285 336    BMET  Recent Labs  02/05/16 0530  NA 140  K 4.6  CL 109  CO2 27  GLUCOSE 162*  BUN 8  CREATININE 1.11  CALCIUM 8.1*   PT/INR No results for input(s): LABPROT, INR in the last 72 hours.   Recent Labs Lab 02/05/16 0530  AST 9*  ALT 14*  ALKPHOS 50  BILITOT 0.2*  PROT 5.7*  ALBUMIN 2.2*     Lipase     Component Value Date/Time   LIPASE 19 11/17/2014 1958     Studies/Results: Dg Retrograde Pyelogram  02/04/2016  CLINICAL DATA:  Hole or injury to the left ureter with urine drainage. EXAM: RETROGRADE PYELOGRAM COMPARISON:  CT 01/31/2016 FINDINGS: Left retrograde examination demonstrates focal narrowing and  mild irregularity of the mid/distal left ureter. There is a small amount of contrast extravasation compatible with a small leak. A double-J ureter stent was placed in the left ureter. Evidence for a surgical drain in the pelvis. IMPRESSION: Focal abnormality in the mid/distal left ureter with contrast extravasation. Placement of left ureter stent. Electronically Signed   By: Markus Daft M.D.   On: 02/04/2016 14:39    Medications: . enoxaparin (LOVENOX) injection  40 mg Subcutaneous Q24H  . feeding supplement (ENSURE ENLIVE)  237 mL Oral QID  . Fluorescein Sodium  500 mg Intravenous Once  . piperacillin-tazobactam (ZOSYN)  IV  3.375 g Intravenous Q8H  . sodium chloride  1,000 mL Intravenous Once  . sodium chloride flush  10-40 mL Intracatheter Q12H   . 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 -hospitalized 11/2015 for disruption of rectal anastomosis and treated with antibiotics -EUA and fistulotomy 12/26/15   Assessment/Plan Non healing anastomosis with pelvic abscess S/p TAKEDOWN OF SURGICAL ANASTAMOSIS WITH COLOSTOMY AND PELVIC WASHOUT  LEFT URETERAL REPAIR WITH CYSTOSCOPY WITH LEFT RETROGRADE PYELOGRAM/URETERAL LEFT STENT PLACEMENT, 0000000, Dr. Elmo Putt Thomas/Dr. Louis Meckel  Anemia -  8.5/26.8 after being transfused 2 units 02/01/16  FEN: NPO/IV fluids ID: Day 6 Zosyn; Vancomycin x 4 days dc/ed 02/04/16 DVT: Lovenox/SCD   Plan:  Add IV tylenol and PCA.  Still out of Protonix so I am starting Pepcid IV BID.  Check NG for blood.  Recheck labs in AM.  I will get foley out when OK with Dr. Louis Meckel.  OOB to chair later after his pain is better. controlled.     LOS: 5 days    Rejina Odle 02/05/2016 [phone]

## 2016-02-05 NOTE — Care Management Note (Signed)
Case Management Note  Patient Details  Name: Seth Villanueva MRN: [id number] Date of Birth: [birth year]  Subjective/Objective:                  1. Left ureteral repair with primary closure 2. Cystoscopy, left retrograde pyelogram with interpretation, left ureteral stent placement Action/Plan: Discharge planning Expected Discharge Date:   (unknown)               Expected Discharge Plan:  [place]  In-House Referral:     Discharge planning Services  CM Consult  Post Acute Care Choice:    Choice offered to:  Patient, Parent  DME Arranged:  Ostomy supplies DME Agency:  [place]:  RN [facility] Agency:  [place]  Status of Service:  In process, will continue to follow  If discussed at Long Length of Stay Meetings, dates discussed:    Additional Comments: Cm met with pt in room to discuss needs.  Pt has ostomy and wound and may need HHRN services upon discharge has had [facility] in the past and wishes to have again if ordered by MD.  CM has requested orders from MD for HHRN/ostomy supplies/wound care.  Pt has [facility] insurance but is paying COBRA and will be eligible until October.  Pt states he has applied for disability twice and has been denied.  Cm has given pt [employer] information to call for help in application for disability and appeal of denials.  Referral has been made to [place] for tentative [facility] services.  Cm will continue to follow for progression and needs. Dellie Catholic, RN 02/05/2016, 1:53 PM

## 2016-02-06 LAB — CBC
HCT: 28.4 % — ABNORMAL LOW (ref 39.0–52.0)
HEMOGLOBIN: 8.7 g/dL — AB (ref 13.0–17.0)
MCH: 23.9 pg — AB (ref 26.0–34.0)
MCHC: 30.6 g/dL (ref 30.0–36.0)
MCV: 78 fL (ref 78.0–100.0)
Platelets: 404 10*3/uL — ABNORMAL HIGH (ref 150–400)
RBC: 3.64 MIL/uL — AB (ref 4.22–5.81)
RDW: 19.2 % — ABNORMAL HIGH (ref 11.5–15.5)
WBC: 10.1 10*3/uL (ref 4.0–10.5)

## 2016-02-06 LAB — BASIC METABOLIC PANEL
ANION GAP: 5 (ref 5–15)
BUN: 10 mg/dL (ref 6–20)
CHLORIDE: 111 mmol/L (ref 101–111)
CO2: 27 mmol/L (ref 22–32)
Calcium: 8.3 mg/dL — ABNORMAL LOW (ref 8.9–10.3)
Creatinine, Ser: 1.43 mg/dL — ABNORMAL HIGH (ref 0.61–1.24)
GFR calc Af Amer: >60 mL/min (ref >60)
GFR calc non Af Amer: >60 mL/min (ref >60)
Glucose, Bld: 107 mg/dL — ABNORMAL HIGH (ref 65–99)
POTASSIUM: 4 mmol/L (ref 3.5–5.1)
SODIUM: 143 mmol/L (ref 135–145)

## 2016-02-06 MED ORDER — ACETAMINOPHEN 10 MG/ML IV SOLN
1000.0000 mg | Freq: Four times a day (QID) | INTRAVENOUS | Status: AC
  Administered 2016-02-06 – 2016-02-07 (×3): 1000 mg via INTRAVENOUS
  Filled 2016-02-06 (×5): qty 100

## 2016-02-06 MED ORDER — DEXTROSE-NACL 5-0.9 % IV SOLN
INTRAVENOUS | Status: DC
  Administered 2016-02-06 – 2016-02-10 (×6): via INTRAVENOUS

## 2016-02-06 MED ORDER — PHENOL 1.4 % MT LIQD
1.0000 | OROMUCOSAL | Status: DC | PRN
  Filled 2016-02-06: qty 177

## 2016-02-06 NOTE — Consult Note (Addendum)
WOC ostomy follow up Stoma type/location: LLQ Colostomy Stomal assessment/size: 1 and 1/2 inches, round, red, moist, edematous. Os at center Peristomal assessment: Intact, clear Treatment options for stomal/peristomal skin: skin barrier ring Output: none (minimal sweat in pouching system) Ostomy pouching: 2pc. 2 and 1/4 inch pouching system with skin barrier ring Education provided: Explained that while I instructed him on the eventual use of a pouching system with an integrated gas filter, we would not be using those while in house as it is important for [place] to know when he begins to pass gas. Patient indicates understanding. Enrolled patient in [place] Start Discharge program: No WOC nursing team will follow, and will remain available to this patient, the surgical, nursing and medical teams.   Thanks, Maudie Flakes, MSN, RN, [place], Arther Abbott  Pager# [phone]

## 2016-02-06 NOTE — Progress Notes (Signed)
2 Days Post-Op  Subjective: Not very happy, very sore, NG is bothering him.  Seems to be better with IV tylenol and PCA.  He has a little sweat in the ostomy bag, but no gas or ostomy output so far.    Objective: Vital signs in last 24 hours: Temp:  [97.6 F (36.4 C)-98.1 F (36.7 C)] 97.8 F (36.6 C) (06/29 0849) Pulse Rate:  [52-67] 52 (06/29 0849) Resp:  [10-115] 16 (06/29 0849) BP: (111-126)/(64-78) 124/71 mmHg (06/29 0849) SpO2:  [94 %-100 %] 100 % (06/29 0849) Last BM Date: 02/03/16 NPO Urine 1525 NG 400 Drain 65 Stool 0 Afebrile, VSS Creatinine is up, and we have increased his IV fluid rate. CBC- H/H  Is stable. Intake/Output from previous day: 06/28 0701 - 06/29 0700 In: 10 [I.V.:10] Out: 1990 [Urine:1525; Emesis/NG output:400; Drains:65] Intake/Output this shift: Total I/O In: -  Out: 450 [Urine:450]  General appearance: alert, cooperative and no distress Resp: clear to auscultation bilaterally GI: soft, very sore, ostomy looks fine, nothing in ostomy bad aside from some clear sweat like fluid.   few BS.  Lab Results:   Recent Labs  02/05/16 0530 02/06/16 0415  WBC 13.3* 10.1  HGB 8.5* 8.7*  HCT 26.8* 28.4*  PLT 336 404*    BMET  Recent Labs  02/05/16 0530 02/06/16 0415  NA 140 143  K 4.6 4.0  CL 109 111  CO2 27 27  GLUCOSE 162* 107*  BUN 8 10  CREATININE 1.11 1.43*  CALCIUM 8.1* 8.3*   PT/INR No results for input(s): LABPROT, INR in the last 72 hours.   Recent Labs Lab 02/05/16 0530  AST 9*  ALT 14*  ALKPHOS 50  BILITOT 0.2*  PROT 5.7*  ALBUMIN 2.2*     Lipase     Component Value Date/Time   LIPASE 19 11/17/2014 1958     Studies/Results: Dg Retrograde Pyelogram  02/04/2016  CLINICAL DATA:  Hole or injury to the left ureter with urine drainage. EXAM: RETROGRADE PYELOGRAM COMPARISON:  CT 01/31/2016 FINDINGS: Left retrograde examination demonstrates focal narrowing and mild irregularity of the mid/distal left ureter. There  is a small amount of contrast extravasation compatible with a small leak. A double-J ureter stent was placed in the left ureter. Evidence for a surgical drain in the pelvis. IMPRESSION: Focal abnormality in the mid/distal left ureter with contrast extravasation. Placement of left ureter stent. Electronically Signed   By: Markus Daft M.D.   On: 02/04/2016 14:39    Medications: . acetaminophen  1,000 mg Intravenous Q6H  . enoxaparin (LOVENOX) injection  40 mg Subcutaneous Q24H  . famotidine (PEPCID) IV  20 mg Intravenous Q12H  . feeding supplement (ENSURE ENLIVE)  237 mL Oral QID  . Fluorescein Sodium  500 mg Intravenous Once  . HYDROmorphone   Intravenous Q4H  .  HYDROmorphone (DILAUDID) injection  1.5 mg Intravenous Once  . piperacillin-tazobactam (ZOSYN)  IV  3.375 g Intravenous Q8H  . sodium chloride  1,000 mL Intravenous Once  . sodium chloride flush  10-40 mL Intracatheter Q12H   . 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 -hospitalized 11/2015 for disruption of rectal anastomosis and treated with antibiotics -EUA and fistulotomy 12/26/15   Assessment/Plan Non healing anastomosis with pelvic abscess S/p TAKEDOWN OF SURGICAL ANASTAMOSIS WITH COLOSTOMY AND PELVIC WASHOUT  LEFT URETERAL REPAIR WITH CYSTOSCOPY WITH LEFT RETROGRADE PYELOGRAM/URETERAL LEFT STENT PLACEMENT, 0000000, Dr. Elmo Putt [place]/Dr. Louis Meckel  Anemia - 8.5/26.8 after being transfused 2 units 02/01/16  FEN: NPO/IV fluids ID: Day 6 Zosyn; Vancomycin x 4 days dc/ed 02/04/16 DVT: Lovenox/SCD  Plan:  Clamp NG and see how he does.  He would like to get rid of it and not much drainage from it in the cannister.  Drainage does not look bloody like yesterday.  Just clear fluid today.Mobilize more today. We increased  IV fluid earlier this AM, and will watch labs and creatinine closely.  Drain is serosanguinous.    LOS: 6 days    Seth Villanueva 02/06/2016 [id number]

## 2016-02-07 ENCOUNTER — Encounter (HOSPITAL_COMMUNITY): Payer: Self-pay

## 2016-02-07 LAB — BASIC METABOLIC PANEL
Anion gap: 3 — ABNORMAL LOW (ref 5–15)
BUN: 9 mg/dL (ref 6–20)
CO2: 23 mmol/L (ref 22–32)
CREATININE: 1.44 mg/dL — AB (ref 0.61–1.24)
Calcium: 6.8 mg/dL — ABNORMAL LOW (ref 8.9–10.3)
Chloride: 109 mmol/L (ref 101–111)
GFR calc Af Amer: >60 mL/min (ref >60)
Glucose, Bld: 274 mg/dL — ABNORMAL HIGH (ref 65–99)
Potassium: 2.9 mmol/L — ABNORMAL LOW (ref 3.5–5.1)
SODIUM: 135 mmol/L (ref 135–145)

## 2016-02-07 LAB — SODIUM, URINE, RANDOM: Sodium, Ur: 118 mmol/L

## 2016-02-07 LAB — CBC
HCT: 23.8 % — ABNORMAL LOW (ref 39.0–52.0)
Hemoglobin: 7.4 g/dL — ABNORMAL LOW (ref 13.0–17.0)
MCH: 24.3 pg — AB (ref 26.0–34.0)
MCHC: 31.1 g/dL (ref 30.0–36.0)
MCV: 78.3 fL (ref 78.0–100.0)
PLATELETS: 332 10*3/uL (ref 150–400)
RBC: 3.04 MIL/uL — ABNORMAL LOW (ref 4.22–5.81)
RDW: 19.3 % — ABNORMAL HIGH (ref 11.5–15.5)
WBC: 7.7 10*3/uL (ref 4.0–10.5)

## 2016-02-07 LAB — CREATININE, URINE, RANDOM: CREATININE, URINE: 27.88 mg/dL

## 2016-02-07 MED ORDER — POTASSIUM CHLORIDE 10 MEQ/100ML IV SOLN
10.0000 meq | INTRAVENOUS | Status: AC
  Administered 2016-02-07 (×6): 10 meq via INTRAVENOUS
  Filled 2016-02-07 (×4): qty 100

## 2016-02-07 NOTE — Progress Notes (Signed)
3 Days Post-Op  Subjective: A little less sore, NG is bothering him.  He has no gas or ostomy output so far.    Objective: Vital signs in last 24 hours: Temp:  [97 F (36.1 C)-98.7 F (37.1 C)] 98.7 F (37.1 C) (06/30 0934) Pulse Rate:  [60-72] 70 (06/30 0233) Resp:  [10-17] 16 (06/30 0934) BP: (108-128)/(70-79) 120/79 mmHg (06/30 0934) SpO2:  [99 %-100 %] 99 % (06/30 0934) Last BM Date: 02/03/16 NPO Urine 1525 NG 550 Drain 100  Intake/Output from previous day: 06/29 0701 - 06/30 0700 In: 1275 [I.V.:1275] Out: 2650 [Urine:2000; Emesis/NG output:550; Drains:100] Intake/Output this shift:    General appearance: alert, cooperative and no distress Resp: clear to auscultation bilaterally GI: soft, very sore, ostomy looks fine, nothing in ostomy bag JP: serous  Lab Results:   Recent Labs  02/06/16 0415 02/07/16 0420  WBC 10.1 7.7  HGB 8.7* 7.4*  HCT 28.4* 23.8*  PLT 404* 332    BMET  Recent Labs  02/06/16 0415 02/07/16 0420  NA 143 135  K 4.0 2.9*  CL 111 109  CO2 27 23  GLUCOSE 107* 274*  BUN 10 9  CREATININE 1.43* 1.44*  CALCIUM 8.3* 6.8*   PT/INR No results for input(s): LABPROT, INR in the last 72 hours.   Recent Labs Lab 02/05/16 0530  AST 9*  ALT 14*  ALKPHOS 50  BILITOT 0.2*  PROT 5.7*  ALBUMIN 2.2*     Lipase     Component Value Date/Time   LIPASE 19 11/17/2014 1958     Studies/Results: No results found.  Medications: . enoxaparin (LOVENOX) injection  40 mg Subcutaneous Q24H  . famotidine (PEPCID) IV  20 mg Intravenous Q12H  . Fluorescein Sodium  500 mg Intravenous Once  . HYDROmorphone   Intravenous Q4H  .  HYDROmorphone (DILAUDID) injection  1.5 mg Intravenous Once  . piperacillin-tazobactam (ZOSYN)  IV  3.375 g Intravenous Q8H  . potassium chloride  10 mEq Intravenous Q1 Hr x 6  . sodium chloride  1,000 mL Intravenous Once  . sodium chloride flush  10-40 mL Intracatheter Q12H   . 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 -hospitalized 11/2015 for disruption of rectal anastomosis and treated with antibiotics -EUA and fistulotomy 12/26/15   Assessment/Plan Non healing anastomosis with pelvic abscess S/p TAKEDOWN OF SURGICAL ANASTAMOSIS WITH COLOSTOMY AND PELVIC WASHOUT  LEFT URETERAL REPAIR WITH CYSTOSCOPY WITH LEFT RETROGRADE PYELOGRAM/URETERAL LEFT STENT PLACEMENT, 0000000, Dr. Elmo Putt [place]/Dr. Louis Meckel  Anemia - 8.5/26.8 after being transfused 2 units 02/01/16  FEN: NPO/IV fluids ID: Day 7 Zosyn, will stop after today; Vancomycin x 4 days dc/ed 02/04/16 DVT: Lovenox/SCD  Plan: Mobilize more today. Cont NG for now IVF's decreased as UOP very adequate.      LOS: 7 days    [name] C. [id number]

## 2016-02-07 NOTE — Progress Notes (Signed)
Nutrition Follow-up  DOCUMENTATION CODES:   Severe malnutrition in context of chronic illness, Underweight  INTERVENTION:  -Ensure Enlive po QID, each supplement provides 350 kcal and 20 grams of protein when medically appropriate -Diet advancement per MD  NUTRITION DIAGNOSIS:   Increased nutrient needs related to cancer and cancer related treatments as evidenced by estimated needs. -ongoing  GOAL:   Patient will meet greater than or equal to 90% of their needs -not meeting, is currently NPO  MONITOR:   PO intake, Supplement acceptance, Labs, Weight trends, I & O's  REASON FOR ASSESSMENT:   Other (Comment) (Low BMI)    ASSESSMENT:   35 year old male with history of distal rectal cancer s/p XRT and robotic assisted LAR, hand sewn colo anal anastomosis and diverting loop ileostomy 05/2014 by Dr. 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 The patient was admitted in April of 2017 at which time there was question of dehiscence of rectal anastomosis. He was managed with antibiotics. He then had an EUA and fistulotomy 12/26/15 by Dr. Marcello Moores.   Mr. Klunk is s/p S/p TAKEDOWN OF SURGICAL ANASTAMOSIS WITH COLOSTOMY AND PELVIC WASHOUT  LEFT URETERAL REPAIR WITH CYSTOSCOPY WITH LEFT RETROGRADE PYELOGRAM/URETERAL LEFT STENT PLACEMENT  continues to be NPO with an NG to suction.  NG Output 325 so far today. No new wts Will follow up with diet advancement  Labs and Medications reviewed: K 2.9; D5 @ 136mL/hr --> 612 calories  Diet Order:  Diet NPO time specified Except for: Sips with Meds, Ice Chips  Skin:   closed incision to rectum  Last BM:  6/26  Height:   Ht Readings from Last 1 Encounters:  01/31/16 6\' 2"  (1.88 m)    Weight:   Wt Readings from Last 1 Encounters:  01/31/16 141 lb 15.6 oz (64.4 kg)    Ideal Body Weight:  86.3 kg  BMI:  Body mass index is 18.22 kg/(m^2).  Estimated Nutritional Needs:    Kcal:  2000-2200  Protein:  100-110g  Fluid:  2.2L/day  EDUCATION NEEDS:   No education needs identified at this time  Satira Anis. [place]mere Daywalt, MS, RD LDN Inpatient Clinical Dietitian Pager [phone]

## 2016-02-07 NOTE — Consult Note (Signed)
WOC ostomy follow up Stoma type/location: LLQ colostomy Stomal assessment/size: 1 and 1/2 inches round per yesterday's measurement.  Pouching system applied yesterday s intact and there is no function. Peristomal assessment: not seen today Treatment options for stomal/peristomal skin: Skin barrier ring in place. Output None (Sweat) Ostomy pouching: 2pc. 2 and 1/4 inch pouching system and skin barrier ring. Pouching system with skin barrier ring placed 02/06/16 Education provided: Patient is still with NGT and is asking appropriate questions about mobility.he is encouraged to move minimally from bed to chair today and perhaps even to hall. Enrolled patient in [place] Start Discharge program: No I will not see tomorrow, but will see on Sunday and next week for continued support and teaching. [place] nursing team will follow, and will remain available to this patient, the surgical, nursing and medical teams.   Thanks, Maudie Flakes, MSN, RN, [place], Arther Abbott  Pager# [phone]

## 2016-02-08 LAB — CREATININE, FLUID (PLEURAL, PERITONEAL, JP DRAINAGE): Creat, Fluid: 1.6 mg/dL

## 2016-02-08 LAB — BASIC METABOLIC PANEL
ANION GAP: 4 — AB (ref 5–15)
BUN: 7 mg/dL (ref 6–20)
CHLORIDE: 111 mmol/L (ref 101–111)
CO2: 28 mmol/L (ref 22–32)
Calcium: 8.4 mg/dL — ABNORMAL LOW (ref 8.9–10.3)
Creatinine, Ser: 1.3 mg/dL — ABNORMAL HIGH (ref 0.61–1.24)
GFR calc Af Amer: >60 mL/min (ref >60)
GFR calc non Af Amer: >60 mL/min (ref >60)
GLUCOSE: 109 mg/dL — AB (ref 65–99)
POTASSIUM: 4 mmol/L (ref 3.5–5.1)
SODIUM: 143 mmol/L (ref 135–145)

## 2016-02-08 NOTE — Progress Notes (Signed)
4 Days Post-Op  Subjective: A little less sore, NG is still bothering him.  He has no gas or ostomy output so far.    Objective: Vital signs in last 24 hours: Temp:  [97.4 F (36.3 C)-98.7 F (37.1 C)] 98.4 F (36.9 C) (07/01 0700) Pulse Rate:  [60-67] 60 (07/01 0700) Resp:  [16-18] 18 (07/01 0700) BP: (118-141)/(72-93) 119/72 mmHg (07/01 0700) SpO2:  [99 %-100 %] 100 % (07/01 0700) Last BM Date: 02/03/16  Intake/Output from previous day: 06/30 0701 - 07/01 0700 In: 2077.2 [I.V.:1527.2; IV Piggyback:550] Out: [id number] [Urine:1450; Emesis/NG output:150; Drains:55] Intake/Output this shift:    General appearance: alert, cooperative and no distress Resp: clear to auscultation bilaterally GI: soft, very sore, ostomy looks fine, nothing in ostomy bag JP: sero-sanguinous  Lab Results:   Recent Labs  02/06/16 0415 02/07/16 0420  WBC 10.1 7.7  HGB 8.7* 7.4*  HCT 28.4* 23.8*  PLT 404* 332    BMET  Recent Labs  02/07/16 0420 02/08/16 0510  NA 135 143  K 2.9* 4.0  CL 109 111  CO2 23 28  GLUCOSE 274* 109*  BUN 9 7  CREATININE 1.44* 1.30*  CALCIUM 6.8* 8.4*   PT/INR No results for input(s): LABPROT, INR in the last 72 hours.   Recent Labs Lab 02/05/16 0530  AST 9*  ALT 14*  ALKPHOS 50  BILITOT 0.2*  PROT 5.7*  ALBUMIN 2.2*     Lipase     Component Value Date/Time   LIPASE 19 11/17/2014 1958     Studies/Results: No results found.  Medications: . enoxaparin (LOVENOX) injection  40 mg Subcutaneous Q24H  . famotidine (PEPCID) IV  20 mg Intravenous Q12H  . Fluorescein Sodium  500 mg Intravenous Once  . HYDROmorphone   Intravenous Q4H  .  HYDROmorphone (DILAUDID) injection  1.5 mg Intravenous Once  . sodium chloride  1,000 mL Intravenous Once  . sodium chloride flush  10-40 mL Intracatheter Q12H   . 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 -hospitalized 11/2015 for disruption of rectal anastomosis and treated with antibiotics -EUA and fistulotomy 12/26/15   Assessment/Plan Non healing anastomosis with pelvic abscess S/p TAKEDOWN OF SURGICAL ANASTAMOSIS WITH COLOSTOMY AND PELVIC WASHOUT  LEFT URETERAL REPAIR WITH CYSTOSCOPY WITH LEFT RETROGRADE PYELOGRAM/URETERAL LEFT STENT PLACEMENT, 0000000, Dr. Elmo Putt [place]/Dr. Louis Meckel  Anemia - 8.5/26.8 after being transfused 2 units 02/01/16, recheck Hgb in AM  FEN: clears/IV fluids, clamp NG [id number] x7 days; Vancomycin x 4 days dc/ed 02/04/16 DVT: Lovenox/SCD  Plan: Mobilize more today. Cont NG for now but clamp NG  Cont IVF's,  UOP adequate.      LOS: 8 days    Seth Villanueva C. [id number]

## 2016-02-09 LAB — BASIC METABOLIC PANEL
ANION GAP: 4 — AB (ref 5–15)
BUN: 8 mg/dL (ref 6–20)
CALCIUM: 8.3 mg/dL — AB (ref 8.9–10.3)
CO2: 27 mmol/L (ref 22–32)
Chloride: 111 mmol/L (ref 101–111)
Creatinine, Ser: 1.2 mg/dL (ref 0.61–1.24)
GFR calc non Af Amer: >60 mL/min (ref >60)
GLUCOSE: 104 mg/dL — AB (ref 65–99)
POTASSIUM: 3.8 mmol/L (ref 3.5–5.1)
Sodium: 142 mmol/L (ref 135–145)

## 2016-02-09 LAB — CBC
HEMATOCRIT: 27.7 % — AB (ref 39.0–52.0)
HEMOGLOBIN: 8.7 g/dL — AB (ref 13.0–17.0)
MCH: 24.4 pg — ABNORMAL LOW (ref 26.0–34.0)
MCHC: 31.4 g/dL (ref 30.0–36.0)
MCV: 77.6 fL — AB (ref 78.0–100.0)
Platelets: 394 10*3/uL (ref 150–400)
RBC: 3.57 MIL/uL — AB (ref 4.22–5.81)
RDW: 18.7 % — AB (ref 11.5–15.5)
WBC: 6.1 10*3/uL (ref 4.0–10.5)

## 2016-02-09 NOTE — Progress Notes (Signed)
5 Days Post-Op  Subjective:  A little less sore, tolerated NG being clamped.  He has no gas or ostomy output so far.    Objective: Vital signs in last 24 hours: Temp:  [98 F (36.7 C)-98.2 F (36.8 C)] 98.2 F (36.8 C) (07/02 0559) Pulse Rate:  [52-77] 52 (07/02 0559) Resp:  [16-18] 16 (07/02 0559) BP: (111-127)/(65-80) 111/65 mmHg (07/02 0559) SpO2:  [99 %-100 %] 99 % (07/02 0559) Last BM Date: 02/03/16  Intake/Output from previous day: 07/01 0701 - 07/02 0700 In: 2326.7 [I.V.:2326.7] Out: 25 [Drains:25] Intake/Output this shift:   General appearance: alert, cooperative and no distress Resp: clear to auscultation bilaterally GI: soft, less sore, ostomy looks fine, nothing in ostomy bag JP: sero-sanguinous  Lab Results:   Recent Labs  02/07/16 0420 02/09/16 0417  WBC 7.7 6.1  HGB 7.4* 8.7*  HCT 23.8* 27.7*  PLT 332 394    BMET  Recent Labs  02/08/16 0510 02/09/16 0417  NA 143 142  K 4.0 3.8  CL 111 111  CO2 28 27  GLUCOSE 109* 104*  BUN 7 8  CREATININE 1.30* 1.20  CALCIUM 8.4* 8.3*   PT/INR No results for input(s): LABPROT, INR in the last 72 hours.   Recent Labs Lab 02/05/16 0530  AST 9*  ALT 14*  ALKPHOS 50  BILITOT 0.2*  PROT 5.7*  ALBUMIN 2.2*     Lipase     Component Value Date/Time   LIPASE 19 11/17/2014 1958     Studies/Results: No results found.  Medications: . enoxaparin (LOVENOX) injection  40 mg Subcutaneous Q24H  . famotidine (PEPCID) IV  20 mg Intravenous Q12H  . Fluorescein Sodium  500 mg Intravenous Once  . HYDROmorphone   Intravenous Q4H  .  HYDROmorphone (DILAUDID) injection  1.5 mg Intravenous Once  . sodium chloride  1,000 mL Intravenous Once  . sodium chloride flush  10-40 mL Intracatheter Q12H   . dextrose 5 % and 0.9% NaCl 100 mL/hr at 02/09/16 [id number]   Distal rectal cancer s/p XRT/Robotic assisted LAR, hand sewn colon anastomosis with diverting loop ileostomy 05/2014 -coloanal stricture requiring dilation  9/16, -ileostomy reversal 07/2015. -hospitalized 11/2015 for disruption of rectal anastomosis and treated with antibiotics -EUA and fistulotomy 12/26/15   Assessment/Plan Non healing anastomosis with pelvic abscess S/p TAKEDOWN OF SURGICAL ANASTAMOSIS WITH COLOSTOMY AND PELVIC WASHOUT  LEFT URETERAL REPAIR WITH CYSTOSCOPY WITH LEFT RETROGRADE PYELOGRAM/URETERAL LEFT STENT PLACEMENT, 0000000, Dr. Elmo Putt [place]/Dr. Louis Meckel  Anemia - 8.5/26.8 after being transfused 2 units 02/01/16, Hgb stable 7/2 at 8.7  FEN: clears as tolerated [id number] x7 days, d/c'd 02/08/16; Vancomycin x 4 days d/c'd 02/04/16 DVT: Lovenox/SCD  Plan: Mobilize more today. Ambulate in hall.  Tolerated NG clamping yesterday.  NG d/c'd today Cont IVF's,  UOP adequate.      LOS: 9 days    Seth Villanueva C. [id number]

## 2016-02-10 MED ORDER — ALUM & MAG HYDROXIDE-SIMETH 200-200-25 MG PO CHEW
1.0000 | CHEWABLE_TABLET | Freq: Four times a day (QID) | ORAL | Status: DC | PRN
  Filled 2016-02-10: qty 2

## 2016-02-10 MED ORDER — OXYCODONE HCL 5 MG PO TABS
5.0000 mg | ORAL_TABLET | ORAL | Status: DC | PRN
  Administered 2016-02-10 – 2016-02-12 (×3): 10 mg via ORAL
  Filled 2016-02-10 (×7): qty 2

## 2016-02-10 MED ORDER — ACETAMINOPHEN 325 MG PO TABS
650.0000 mg | ORAL_TABLET | Freq: Four times a day (QID) | ORAL | Status: DC
  Administered 2016-02-10 – 2016-02-12 (×5): 650 mg via ORAL
  Filled 2016-02-10 (×6): qty 2

## 2016-02-10 MED ORDER — ALUM & MAG HYDROXIDE-SIMETH 200-200-20 MG/5ML PO SUSP
15.0000 mL | Freq: Four times a day (QID) | ORAL | Status: DC | PRN
  Administered 2016-02-10 – 2016-02-11 (×2): 30 mL via ORAL
  Filled 2016-02-10 (×2): qty 30

## 2016-02-10 MED ORDER — HYDROMORPHONE HCL 1 MG/ML IJ SOLN
0.5000 mg | INTRAMUSCULAR | Status: DC | PRN
  Administered 2016-02-10 – 2016-02-11 (×5): 1 mg via INTRAVENOUS
  Administered 2016-02-11: 0.5 mg via INTRAVENOUS
  Administered 2016-02-12 (×2): 1 mg via INTRAVENOUS
  Filled 2016-02-10 (×8): qty 1

## 2016-02-10 NOTE — Progress Notes (Signed)
6 Days Post-Op  Subjective:  Feeling better, having gas from stoma, had a bit of rectal leakage, walked in the hallway once  Objective: Vital signs in last 24 hours: Temp:  [97.9 F (36.6 C)-98 F (36.7 C)] 98 F (36.7 C) (07/03 0548) Pulse Rate:  [59-72] 59 (07/03 0548) Resp:  [14-18] 18 (07/03 0750) BP: (113-119)/(60-71) 113/60 mmHg (07/03 0548) SpO2:  [99 %-100 %] 99 % (07/03 0750) Last BM Date: 02/03/16  Intake/Output from previous day: 07/02 0701 - 07/03 0700 In: 2613.8 [P.O.:510; I.V.:2103.8] Out: 303 [Urine:300; Drains:3] Intake/Output this shift:   General appearance: alert, cooperative and no distress GI: soft, less sore, ostomy looks fine, gas in ostomy bag JP: sero-sanguinous  Lab Results:   Recent Labs  02/09/16 0417  WBC 6.1  HGB 8.7*  HCT 27.7*  PLT 394    BMET  Recent Labs  02/08/16 0510 02/09/16 0417  NA 143 142  K 4.0 3.8  CL 111 111  CO2 28 27  GLUCOSE 109* 104*  BUN 7 8  CREATININE 1.30* 1.20  CALCIUM 8.4* 8.3*   PT/INR No results for input(s): LABPROT, INR in the last 72 hours.   Recent Labs Lab 02/05/16 0530  AST 9*  ALT 14*  ALKPHOS 50  BILITOT 0.2*  PROT 5.7*  ALBUMIN 2.2*     Lipase     Component Value Date/Time   LIPASE 19 11/17/2014 1958     Studies/Results: No results found.  Medications: . acetaminophen  650 mg Oral QID  . enoxaparin (LOVENOX) injection  40 mg Subcutaneous Q24H  . sodium chloride  1,000 mL Intravenous Once  . sodium chloride flush  10-40 mL Intracatheter Q12H   . 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 -hospitalized 11/2015 for disruption of rectal anastomosis and treated with antibiotics -EUA and fistulotomy 12/26/15   Assessment/Plan Non healing anastomosis with pelvic  abscess S/p TAKEDOWN OF SURGICAL ANASTAMOSIS WITH COLOSTOMY AND PELVIC WASHOUT  LEFT URETERAL REPAIR WITH CYSTOSCOPY WITH LEFT RETROGRADE PYELOGRAM/URETERAL LEFT STENT PLACEMENT, 0000000, Dr. Elmo Putt [place]/Dr. Louis Meckel  Anemia - 8.5/26.8 after being transfused 2 units 02/01/16, Hgb stable 7/2 at 8.7  FEN: clears as tolerated [id number] x7 days, d/c'd 02/08/16; Vancomycin x 4 days d/c'd 02/04/16 DVT: Lovenox/SCD  Plan: Mobilize more today. Ambulate in hall.  PT.  Advance diet Minimize IVF's,  UOP adequate.      LOS: 10 days    Seth Villanueva C. [id number]

## 2016-02-10 NOTE — Consult Note (Signed)
WOC ostomy follow up Stoma type/location: LLQ colostomy Stomal assessment/size: 1 and 1/2 inches visualized through intact poiuch Peristomal assessment: not seen today Treatment options for stomal/peristomal skin: skin barrier ring in place Output flatus in pouch.  PAtient is preparing to ear supper (first solid, soft meal) Ostomy pouching: 2pc. Pouching system with skin barrier ring Education provided: Patient and parents counseled on expectations following surgery, he described will to work with therapy to regain muscle and strength/endurance.  Looking forward to meal, eating again in general. We discuss colostomy irrigation, resumption of activity, and return to activities of daily living such as showering, etc. We together plan pouch change session for tomorrow at 1pm with parents present. I have provided supplies and educational materials to room and we will go over these tomorrow. Enrolled patient in [place] Start Discharge program: No WOC nursing team will follow, and will remain available to this patient, surgery, the nursing and medical teams.   Thanks, Maudie Flakes, MSN, RN, [place], Arther Abbott  Pager# [phone]

## 2016-02-10 NOTE — Evaluation (Signed)
Physical Therapy Evaluation Patient Details Name: Seth Villanueva MRN: [id number] DOB: 1981/01/02 Today's Date: 02/10/2016   History of Present Illness  35 yo male adm with fever, recurrent infection around anastomosis, s/p  TAKEDOWN OF SURGICAL ANASTAMOSIS WITH COLOSTOMY AND PELVIC WASHOUT , L ureter repair;    the patient has undergone multiple additional procedures to encourage healing of the anastomosis without success;      PMHx:  distal rectal cancer s/p LAR, radiation, chemo now in remission    Clinical Impression  Pt admitted with above diagnosis. Pt currently with functional limitations due to the deficits listed below (see PT Problem List).   Pt will benefit from skilled PT to increase their independence and safety with mobility to allow discharge to the venue listed below.   Pt is quite deconditioned, he reports significant fatigue with daily activities and greater than 50lb wt loss since dx; will benefit from PT to incr strength/independence with functional mobility     Follow Up Recommendations No PT follow up    Equipment Recommendations  None recommended by PT    Recommendations for Other Services       Precautions / Restrictions Restrictions Weight Bearing Restrictions: No      Mobility  Bed Mobility Overal bed mobility: Needs Assistance Bed Mobility: Rolling;Sidelying to Sit Rolling: Min assist Sidelying to sit: Min assist;HOB elevated       General bed mobility comments: assist to elevate trunk, painful transition  Transfers Overall transfer level: Needs assistance Equipment used: Rolling walker (2 wheeled) Transfers: Sit to/from Stand Sit to Stand: Min guard;Supervision         General transfer comment: for safety  Ambulation/Gait Ambulation/Gait assistance: Min guard;Min assist Ambulation Distance (Feet): 140 Feet Assistive device:  (IV pole) Gait Pattern/deviations: Step-through pattern;Decreased stride length   Gait velocity interpretation:  Below normal speed for age/gender General Gait Details: cues for  posture, requires 2 rests during above distance, requires assist to push IV pole over uneven surface  Stairs            Wheelchair Mobility    Modified Rankin (Stroke Patients Only)       Balance                                             Pertinent Vitals/Pain Pain Assessment:  (normal range 3-5 per pt)    Home Living Family/patient expects to be discharged to:: Private residence Living Arrangements: Parent Available Help at Discharge: Available 24 hours/day Type of Home: House Home Access: Stairs to enter   Technical brewer of Steps: 8 Home Layout: One level Home Equipment: None      Prior Function Level of Independence: Independent         Comments: Mom assists with meals     Hand Dominance        Extremity/Trunk Assessment   Upper Extremity Assessment: Generalized weakness           Lower Extremity Assessment: Generalized weakness         Communication   Communication: No difficulties  Cognition Arousal/Alertness: Awake/alert Behavior During Therapy: WFL for tasks assessed/performed Overall Cognitive Status: Within Functional Limits for tasks assessed                      General Comments      Exercises  Assessment/Plan    PT Assessment Patient needs continued PT services  PT Diagnosis Difficulty walking;Generalized weakness   PT Problem List Decreased strength;Decreased activity tolerance;Decreased balance;Decreased mobility;Decreased range of motion;Pain  PT Treatment Interventions DME instruction;Gait training;Functional mobility training;Therapeutic activities;Therapeutic exercise;Patient/family education   PT Goals (Current goals can be found in the Care Plan section) Acute Rehab PT Goals Patient Stated Goal: stronger PT Goal Formulation: With patient Time For Goal Achievement: 02/24/16 Potential to Achieve Goals:  Good    Frequency Min 3X/week   Barriers to discharge        Co-evaluation               End of Session   Activity Tolerance: Patient tolerated treatment well;No increased pain Patient left: in chair;with call bell/phone within reach;with family/visitor present           Time: [id number] PT Time Calculation (min) (ACUTE ONLY): 24 min   Charges:   PT Evaluation $PT Eval Low Complexity: 1 Procedure PT Treatments $Gait Training: 8-22 mins   PT G Codes:        Kyoko Elsea 2016/02/22, 11:18 AM

## 2016-02-11 LAB — BASIC METABOLIC PANEL
Anion gap: 6 (ref 5–15)
BUN: 14 mg/dL (ref 6–20)
CO2: 28 mmol/L (ref 22–32)
CREATININE: 1.21 mg/dL (ref 0.61–1.24)
Calcium: 8.1 mg/dL — ABNORMAL LOW (ref 8.9–10.3)
Chloride: 106 mmol/L (ref 101–111)
GFR calc Af Amer: >60 mL/min (ref >60)
GLUCOSE: 113 mg/dL — AB (ref 65–99)
Potassium: 3.7 mmol/L (ref 3.5–5.1)
SODIUM: 140 mmol/L (ref 135–145)

## 2016-02-11 MED ORDER — ENSURE ENLIVE PO LIQD
237.0000 mL | Freq: Two times a day (BID) | ORAL | Status: DC
  Administered 2016-02-11 – 2016-02-12 (×2): 237 mL via ORAL

## 2016-02-11 NOTE — Progress Notes (Signed)
Physical Therapy Treatment Patient Details Name: Seth Villanueva MRN: [id number] DOB: 24-Jun-1981 Today's Date: 02/11/2016    History of Present Illness 35 yo male adm with fever, recurrent infection around anastomosis, s/p  TAKEDOWN OF SURGICAL ANASTAMOSIS WITH COLOSTOMY AND PELVIC WASHOUT , L ureter repair;    the patient has undergone multiple additional procedures to encourage healing of the anastomosis without success;      PMHx:  distal rectal cancer s/p LAR, radiation, chemo now in remission      PT Comments    Assisted OOB to amb in hallway with increased time due to ABd pain.  Follow Up Recommendations  No PT follow up     Equipment Recommendations  None recommended by PT    Recommendations for Other Services       Precautions / Restrictions Precautions Precautions: None Restrictions Weight Bearing Restrictions: No    Mobility  Bed Mobility Overal bed mobility: Needs Assistance Bed Mobility: Supine to Sit Rolling: Min guard         General bed mobility comments: assist to elevate trunk, painful transition  Transfers Overall transfer level: Needs assistance Equipment used: None Transfers: Sit to/from Stand Sit to Stand: Supervision;Min guard         General transfer comment: for safety  Ambulation/Gait Ambulation/Gait assistance: Supervision;Min guard Ambulation Distance (Feet): 185 Feet Assistive device: None Gait Pattern/deviations: Step-through pattern;Decreased stride length;Trunk flexed Gait velocity: decreased   General Gait Details: increased time.  Slow but staedy gait holding to IV pole.  Mild c/o feeling "whoozy" (pain meds)    Stairs            Wheelchair Mobility    Modified Rankin (Stroke Patients Only)       Balance                                    Cognition                            Exercises      General Comments        Pertinent Vitals/Pain Pain Assessment: Faces Faces Pain  Scale: Hurts little more Pain Location: ABd "bloated" Pain Intervention(s): Monitored during session    Home Living                      Prior Function            PT Goals (current goals can now be found in the care plan section) Progress towards PT goals: Progressing toward goals    Frequency  Min 3X/week    PT Plan Current plan remains appropriate    Co-evaluation             End of Session Equipment Utilized During Treatment: Gait belt Activity Tolerance: Patient tolerated treatment well;No increased pain Patient left: in chair;with call bell/phone within reach;with family/visitor present     Time: [id number] PT Time Calculation (min) (ACUTE ONLY): 15 min  Charges:  $Gait Training: 8-22 mins                    G Codes:      Rica Koyanagi  PTA [facility]  Acute  Rehab Pager      [phone]

## 2016-02-11 NOTE — Consult Note (Signed)
WOC ostomy follow up Stoma type/location: LLQ colostomy Stomal assessment/size: Slightly less than 1 and 3/4 inches round, red, edematous, moist, pink Peristomal assessment: intact, clear Treatment options for stomal/peristomal skin: skin barrier ring Output dark brown liquid effluent at this time, was pasty brown early this am. Ostomy pouching: 2pc. Pouching system (2 and 1/2 inch) with skin barrier ring Education provided: Extended session with patient and parents that included pouch change.  As this system is quite different from his previous, additional practice with preparation and application is required.  Patient is independent in emptying and cleaning bottom of pouching system tail closure, albeit with the ileostomy is was quite a bit easier to empty pouch.  Patient is introduced to [employer] lubricating deodorant and will try this today, he is also trying an opaque pouching system with gas filter today. Educational booklets provided that address sexuality and intimacy, sports and activities, diet and travel.  Enrolled patient in [place] Start Discharge program: Yes [facility] nursing team will follow, and will remain available to this patient, his parents, and the surgical, nursing and medical teams.  Will plan to see tomorrow am. Thanks, Maudie Flakes, MSN, RN, [facility], Arther Abbott  Pager# [phone]

## 2016-02-11 NOTE — Progress Notes (Signed)
7 Days Post-Op  Subjective:  Feeling better, had a large BM yesterday, having a bit of rectal leakage, walked in the hallway   Objective: Vital signs in last 24 hours: Temp:  [97.8 F (36.6 C)-98.1 F (36.7 C)] 98 F (36.7 C) (07/04 0443) Pulse Rate:  [59-62] 62 (07/04 0443) Resp:  [16-20] 16 (07/04 0443) BP: (100-111)/(59-78) 100/59 mmHg (07/04 0443) SpO2:  [99 %-100 %] 99 % (07/04 0443) Last BM Date: 02/10/16 (in colostomy)  Intake/Output from previous day: 07/03 0701 - 07/04 0700 In: 250 [P.O.:240; I.V.:10] Out: 656 [Drains:6; Stool:650] Intake/Output this shift: Total I/O In: -  Out: 10 [Drains:10] General appearance: alert, cooperative and no distress GI: soft, less sore, ostomy well perfused but edematous, gas and stool in ostomy bag JP: sero-sanguinous  Lab Results:   Recent Labs  02/09/16 0417  WBC 6.1  HGB 8.7*  HCT 27.7*  PLT 394    BMET  Recent Labs  02/09/16 0417 02/11/16 0430  NA 142 140  K 3.8 3.7  CL 111 106  CO2 27 28  GLUCOSE 104* 113*  BUN 8 14  CREATININE 1.20 1.21  CALCIUM 8.3* 8.1*   PT/INR No results for input(s): LABPROT, INR in the last 72 hours.   Recent Labs Lab 02/05/16 0530  AST 9*  ALT 14*  ALKPHOS 50  BILITOT 0.2*  PROT 5.7*  ALBUMIN 2.2*     Lipase     Component Value Date/Time   LIPASE 19 11/17/2014 1958     Studies/Results: No results found.  Medications: . acetaminophen  650 mg Oral QID  . enoxaparin (LOVENOX) injection  40 mg Subcutaneous Q24H  . sodium chloride  1,000 mL Intravenous Once  . sodium chloride flush  10-40 mL Intracatheter Q12H   . 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 -hospitalized 11/2015 for disruption of rectal anastomosis and treated with antibiotics -EUA and fistulotomy  12/26/15   Assessment/Plan Non healing anastomosis with pelvic abscess S/p TAKEDOWN OF SURGICAL ANASTAMOSIS WITH COLOSTOMY AND PELVIC WASHOUT  LEFT URETERAL REPAIR WITH CYSTOSCOPY WITH LEFT RETROGRADE PYELOGRAM/URETERAL LEFT STENT PLACEMENT, 0000000, Dr. Elmo Putt Nishant Schrecengost/Dr. Louis Meckel  Anemia - 8.5/26.8 after being transfused 2 units 02/01/16, Hgb stable 7/2 at 8.7  FEN: clears as tolerated [id number] x7 days, d/c'd 02/08/16; Vancomycin x 4 days d/c'd 02/04/16 DVT: Lovenox/SCD  Plan: Mobilize more today. Ambulate in hall.  PT.  Cont high protein diet Minimize IVF's,  UOP adequate.    Possible d/c tomorrow    LOS: 11 days    Cliffard Hair C. [id number]

## 2016-02-12 MED ORDER — HEPARIN SOD (PORK) LOCK FLUSH 100 UNIT/ML IV SOLN
500.0000 [IU] | INTRAVENOUS | Status: AC | PRN
  Administered 2016-02-12: 500 [IU]

## 2016-02-12 MED ORDER — OXYCODONE HCL 5 MG PO TABS: 5.0000 mg | ORAL_TABLET | ORAL | Status: DC | PRN

## 2016-02-12 NOTE — Progress Notes (Signed)
Patient alert and oriented with pain controlled. Patient given discharge instructions and prescriptions. Patient verbalized understanding of home care with ostomy and JP drain. All questions and concerns answered.

## 2016-02-12 NOTE — Discharge Instructions (Signed)
ABDOMINAL SURGERY: POST OP INSTRUCTIONS  1. DIET: Follow a light bland diet the first 24 hours after arrival home, such as soup, liquids, crackers, etc.  Be sure to include lots of fluids daily.  Avoid fast food or heavy meals as your are more likely to get nauseated.  Do not eat any uncooked fruits or vegetables for the next 2 weeks as your colon heals. 2. Take your usually prescribed home medications unless otherwise directed. 3. PAIN CONTROL: a. Pain is best controlled by a usual combination of three different methods TOGETHER: i. Ice/Heat ii. Over the counter pain medication iii. Prescription pain medication b. Most patients will experience some swelling and bruising around the incisions.  Ice packs or heating pads (30-60 minutes up to 6 times a day) will help. Use ice for the first few days to help decrease swelling and bruising, then switch to heat to help relax tight/sore spots and speed recovery.  Some people prefer to use ice alone, heat alone, alternating between ice & heat.  Experiment to what works for you.  Swelling and bruising can take several weeks to resolve.   c. It is helpful to take an over-the-counter pain medication regularly for the first few weeks.  Choose one of the following that works best for you: i. Naproxen (Aleve, etc)  Two 220mg  tabs twice a day ii. Ibuprofen (Advil, etc) Three 200mg  tabs four times a day (every meal & bedtime) iii. Acetaminophen (Tylenol, etc) 500-650mg  four times a day (every meal & bedtime) d. A  prescription for pain medication (such as oxycodone, hydrocodone, etc) should be given to you upon discharge.  Take your pain medication as prescribed.  i. If you are having problems/concerns with the prescription medicine (does not control pain, nausea, vomiting, rash, itching, etc), please call us [phone] to see if we need to switch you to a different pain medicine that will work better for you and/or control your side effect better. ii. If you  need a refill on your pain medication, please contact your pharmacy.  They will contact our office to request authorization. Prescriptions will not be filled after 5 pm or on week-ends. 4. Avoid getting constipated.  Between the surgery and the pain medications, it is common to experience some constipation.  Increasing fluid intake and taking a fiber supplement (such as Metamucil, Citrucel, FiberCon, MiraLax, etc) 1-2 times a day regularly will usually help prevent this problem from occurring.  A mild laxative (prune juice, Milk of Magnesia, MiraLax, etc) should be taken according to package directions if there are no bowel movements after 48 hours.   5. Watch out for diarrhea.  If you have many loose bowel movements, simplify your diet to bland foods & liquids for a few days.  Stop any stool softeners and decrease your fiber supplement.  Switching to mild anti-diarrheal medications (Kayopectate, Pepto Bismol) can help.  If this worsens or does not improve, please call us. 6. Wash / shower every day.  You may shower over the incision / wound.  Avoid baths until the skin is fully healed.  Continue to shower over incision(s) after the dressing is off. 7. You may leave the incision open to air.  You may replace a dressing/Band-Aid to cover the incision for comfort if you wish. 8. Empty JP drain once a day.  Change dressing daily. 9. ACTIVITIES as tolerated:   a. You may resume regular (light) daily activities beginning the next day--such as daily self-care, walking, climbing stairs--gradually increasing  activities as tolerated.  If you can walk 30 minutes without difficulty, it is safe to try more intense activity such as jogging, treadmill, bicycling, low-impact aerobics, swimming, etc. b. Save the most intensive and strenuous activity for last such as sit-ups, heavy lifting, contact sports, etc  Refrain from any heavy lifting or straining until you are off narcotics for pain control.   c. DO NOT PUSH THROUGH  PAIN.  Let pain be your guide: If it hurts to do something, don't do it.  Pain is your body warning you to avoid that activity for another week until the pain goes down. d. You may drive when you are no longer taking prescription pain medication, you can comfortably wear a seatbelt, and you can safely maneuver your car and apply brakes. e. Dennis Bast may have sexual intercourse when it is comfortable.  10. FOLLOW UP in our office a. Please call [facility] at [phone] to set up an appointment to see your surgeon in the office for a follow-up appointment approximately 1-2 weeks after your surgery. b. Make sure that you call for this appointment the day you arrive home to insure a convenient appointment time. 10. IF YOU HAVE DISABILITY OR FAMILY LEAVE FORMS, BRING THEM TO THE OFFICE FOR PROCESSING.  DO NOT GIVE THEM TO YOUR DOCTOR.   WHEN TO CALL us [phone]: 1. Poor pain control 2. Reactions / problems with new medications (rash/itching, nausea, etc)  3. Fever over 101.5 F (38.5 C) 4. Inability to urinate 5. Nausea and/or vomiting 6. Worsening swelling or bruising 7. Continued bleeding from incision. 8. Increased pain, redness, or drainage from the incision  The clinic staff is available to answer your questions during regular business hours (8:30am-5pm).  Please dont hesitate to call and ask to speak to one of our nurses for clinical concerns.   A surgeon from [facility] is always on call at the hospitals   If you have a medical emergency, go to the nearest emergency room or call 911.    [facility], [address], [place], [place], [place]  [address] ? MAIN: [phone] ? TOLL FREE: [phone] ? FAX [phone] [email or web address]

## 2016-02-12 NOTE — Discharge Summary (Signed)
Physician Discharge Summary  Patient ID: Seth Villanueva MRN: [id number] DOB/AGE: November 13, 1980 35 y.o.  Admit date: 01/31/2016 Discharge date: 02/12/2016  Admission Diagnoses: Fever, diarrhea  Discharge Diagnoses:  Active Problems:   Fever pelvic abscess  Discharged Condition: good  Hospital Course: Patient admitted with worsening fevers and diarrhea.  CT scan showed his anastomosis disruption was worse.  I recommended takedown and permanent colostomy.  This was done on 02/04/16.  He developed a post op ileus that resolved around POD 5.  His diet was then advanced.  By POD 8 he was tolerating a diet and having bowel function.  His pain was controlled.    Consults: None  Significant Diagnostic Studies: labs: cbc, chemistry  Treatments: IV hydration, antibiotics: vancomycin and Zosyn, analgesia: Dilaudid and surgery: see above  Discharge Exam: Blood pressure 106/57, pulse 61, temperature 98.1 F (36.7 C), temperature source Oral, resp. rate 17, height 6\' 2"  (1.88 m), weight 64.4 kg (141 lb 15.6 oz), SpO2 98 %. General appearance: alert and cooperative GI: soft, nondistended, ostomy viable Incision/Wound: clean, intact JP: serous drainage  Disposition: 01-Home or Self Care     Medication List    STOP taking these medications        LORazepam 0.5 MG tablet  Commonly known as:  ATIVAN      TAKE these medications        acetaminophen 325 MG tablet  Commonly known as:  TYLENOL  Take 325 mg by mouth once as needed for mild pain (pain). Reported on 07/29/2015     bismuth subsalicylate [id number] suspension  Commonly known as:  PEPTO BISMOL  Take 30 mLs by mouth daily as needed for diarrhea or loose stools.     cetirizine 10 MG tablet  Commonly known as:  ZYRTEC  Take 10 mg by mouth daily as needed for allergies.     diphenoxylate-atropine 2.5-0.025 MG tablet  Commonly known as:  LOMOTIL  Take 1 tablet by mouth 4 (four) times daily as needed for diarrhea or loose stools.      loperamide 2 MG capsule  Commonly known as:  IMODIUM  Take 4 mg by mouth every 6 (six) hours as needed for diarrhea or loose stools.     metroNIDAZOLE 500 MG tablet  Commonly known as:  FLAGYL  Take 1 tablet by mouth 3 (three) times daily.     ondansetron 4 MG tablet  Commonly known as:  ZOFRAN  Take 1 tablet (4 mg total) by mouth every 8 (eight) hours as needed for nausea or vomiting.     oxyCODONE 5 MG immediate release tablet  Commonly known as:  Oxy IR/ROXICODONE  Take 1-2 tablets (5-10 mg total) by mouth every 4 (four) hours as needed for moderate pain, severe pain or breakthrough pain.     simethicone 80 MG chewable tablet  Commonly known as:  MYLICON  Chew 80 mg by mouth every 6 (six) hours as needed for flatulence.           Follow-up Information    Schedule an appointment as soon as possible for a visit with Betsy Coder, MD.   Specialty:  Oncology   Contact information:   [place] [address] [phone]       Schedule an appointment as soon as possible for a visit with [facility].   Contact information:   [place] [address] [phone]       Follow up with [facility]   Specialty:  Emergency Medicine   Why:  As needed, If symptoms worsen   Contact information:   [place] [id number] [place] [id number] [phone]      Follow up with Ardis Hughs, MD. Schedule an appointment as soon as possible for a visit in 1 month.   Specialty:  Urology   Why:  Urology - left ureteral stent removal   Contact information:   [place] [address] [phone]       Follow up with [name]., MD. Schedule an appointment as soon as possible for a visit in 2 weeks.   Specialty:  General Surgery   Contact information:   [place]  [address] [phone]        Signed: [name] [id number], 0000000 AM

## 2016-02-24 ENCOUNTER — Ambulatory Visit: Payer: 59 | Admitting: Nurse Practitioner

## 2016-02-25 ENCOUNTER — Telehealth: Payer: Self-pay | Admitting: Oncology

## 2016-02-25 NOTE — Telephone Encounter (Signed)
Patient's mom called yesterday to cx 7/17 appointments due to patient being in hospital and requested appointments be rescheduled 8 weeks from 7/5. Spoke with LT to make sure this is ok. Per LT ok, however schedule with BS and ask mom to call office when patient is home. Left message on voicemail at both numbers in [facility] re new appointment for 8/29 and calling office when patient is home.

## 2016-02-26 ENCOUNTER — Other Ambulatory Visit: Payer: Self-pay | Admitting: *Deleted

## 2016-02-26 NOTE — Progress Notes (Signed)
  Oncology Nurse Navigator Documentation  Navigator Location: [facility] (02/26/16 [id number]) Navigator Encounter Type: Letter/Fax/Email (02/26/16 1632)   Per Dr. Benay Spice, needs f/u visit in 1 month.                 Interventions: Other (02/26/16 1632)   POF sent for 1 month visit and flush.

## 2016-03-28 ENCOUNTER — Emergency Department (HOSPITAL_COMMUNITY)
Admission: EM | Admit: 2016-03-28 | Discharge: 2016-03-28 | Disposition: A | Discharge status: Home / Self Care | Payer: 59 | Attending: Emergency Medicine | Admitting: Emergency Medicine

## 2016-03-28 ENCOUNTER — Encounter (HOSPITAL_COMMUNITY): Payer: Self-pay | Admitting: *Deleted

## 2016-03-28 ENCOUNTER — Emergency Department (HOSPITAL_COMMUNITY): Payer: 59

## 2016-03-28 ENCOUNTER — Telehealth: Payer: Self-pay | Admitting: Surgery

## 2016-03-28 DIAGNOSIS — Z85048 Personal history of other malignant neoplasm of rectum, rectosigmoid junction, and anus: Secondary | ICD-10-CM | POA: Diagnosis not present

## 2016-03-28 DIAGNOSIS — R1084 Generalized abdominal pain: Secondary | ICD-10-CM | POA: Insufficient documentation

## 2016-03-28 DIAGNOSIS — R112 Nausea with vomiting, unspecified: Secondary | ICD-10-CM

## 2016-03-28 DIAGNOSIS — Z7982 Long term (current) use of aspirin: Secondary | ICD-10-CM | POA: Insufficient documentation

## 2016-03-28 DIAGNOSIS — Z79899 Other long term (current) drug therapy: Secondary | ICD-10-CM | POA: Diagnosis not present

## 2016-03-28 DIAGNOSIS — Z87891 Personal history of nicotine dependence: Secondary | ICD-10-CM | POA: Insufficient documentation

## 2016-03-28 DIAGNOSIS — R1013 Epigastric pain: Secondary | ICD-10-CM | POA: Diagnosis present

## 2016-03-28 LAB — URINALYSIS, ROUTINE W REFLEX MICROSCOPIC
Bilirubin Urine: NEGATIVE
Glucose, UA: NEGATIVE mg/dL
HGB URINE DIPSTICK: NEGATIVE
Ketones, ur: NEGATIVE mg/dL
Leukocytes, UA: NEGATIVE
NITRITE: NEGATIVE
PROTEIN: NEGATIVE mg/dL
SPECIFIC GRAVITY, URINE: 1.021 (ref 1.005–1.030)
pH: 7 (ref 5.0–8.0)

## 2016-03-28 LAB — CBC WITH DIFFERENTIAL/PLATELET
Basophils Absolute: 0 10*3/uL (ref 0.0–0.1)
Basophils Relative: 0 %
Eosinophils Absolute: 0 10*3/uL (ref 0.0–0.7)
Eosinophils Relative: 0 %
HEMATOCRIT: 35 % — AB (ref 39.0–52.0)
HEMOGLOBIN: 11.5 g/dL — AB (ref 13.0–17.0)
LYMPHS ABS: 0.5 10*3/uL — AB (ref 0.7–4.0)
LYMPHS PCT: 6 %
MCH: 26.2 pg (ref 26.0–34.0)
MCHC: 32.9 g/dL (ref 30.0–36.0)
MCV: 79.7 fL (ref 78.0–100.0)
Monocytes Absolute: 0.4 10*3/uL (ref 0.1–1.0)
Monocytes Relative: 5 %
NEUTROS ABS: 8.2 10*3/uL — AB (ref 1.7–7.7)
NEUTROS PCT: 89 %
Platelets: 234 10*3/uL (ref 150–400)
RBC: 4.39 MIL/uL (ref 4.22–5.81)
RDW: 18.5 % — ABNORMAL HIGH (ref 11.5–15.5)
WBC: 9.1 10*3/uL (ref 4.0–10.5)

## 2016-03-28 LAB — COMPREHENSIVE METABOLIC PANEL
ALT: 15 U/L — AB (ref 17–63)
ANION GAP: 7 (ref 5–15)
AST: 17 U/L (ref 15–41)
Albumin: 4.1 g/dL (ref 3.5–5.0)
Alkaline Phosphatase: 98 U/L (ref 38–126)
BUN: 14 mg/dL (ref 6–20)
CHLORIDE: 107 mmol/L (ref 101–111)
CO2: 25 mmol/L (ref 22–32)
CREATININE: 0.76 mg/dL (ref 0.61–1.24)
Calcium: 9.1 mg/dL (ref 8.9–10.3)
GFR calc non Af Amer: >60 mL/min (ref >60)
Glucose, Bld: 103 mg/dL — ABNORMAL HIGH (ref 65–99)
POTASSIUM: 3.4 mmol/L — AB (ref 3.5–5.1)
SODIUM: 139 mmol/L (ref 135–145)
Total Bilirubin: 0.5 mg/dL (ref 0.3–1.2)
Total Protein: 6.9 g/dL (ref 6.5–8.1)

## 2016-03-28 LAB — LIPASE, BLOOD: Lipase: 26 U/L (ref 11–51)

## 2016-03-28 MED ORDER — SIMETHICONE 80 MG PO CHEW
80.0000 mg | CHEWABLE_TABLET | Freq: Once | ORAL | Status: AC
  Administered 2016-03-28: 80 mg via ORAL
  Filled 2016-03-28: qty 1

## 2016-03-28 MED ORDER — IOPAMIDOL (ISOVUE-300) INJECTION 61%
100.0000 mL | Freq: Once | INTRAVENOUS | Status: AC | PRN
  Administered 2016-03-28: 100 mL via INTRAVENOUS

## 2016-03-28 MED ORDER — POTASSIUM CHLORIDE CRYS ER 20 MEQ PO TBCR
40.0000 meq | EXTENDED_RELEASE_TABLET | Freq: Once | ORAL | Status: AC
  Administered 2016-03-28: 40 meq via ORAL
  Filled 2016-03-28: qty 2

## 2016-03-28 MED ORDER — HEPARIN SOD (PORK) LOCK FLUSH 100 UNIT/ML IV SOLN
500.0000 [IU] | Freq: Once | INTRAVENOUS | Status: AC
  Administered 2016-03-28: 500 [IU]
  Filled 2016-03-28: qty 5

## 2016-03-28 MED ORDER — ONDANSETRON HCL 4 MG PO TABS: 4.0000 mg | ORAL_TABLET | Freq: Four times a day (QID) | ORAL | 0 refills | Status: DC

## 2016-03-28 MED ORDER — DIATRIZOATE MEGLUMINE & SODIUM 66-10 % PO SOLN
15.0000 mL | Freq: Once | ORAL | Status: AC
  Administered 2016-03-28: 15 mL via ORAL

## 2016-03-28 MED ORDER — ONDANSETRON HCL 4 MG/2ML IJ SOLN
4.0000 mg | Freq: Once | INTRAMUSCULAR | Status: AC
  Administered 2016-03-28: 4 mg via INTRAVENOUS
  Filled 2016-03-28: qty 2

## 2016-03-28 MED ORDER — SODIUM CHLORIDE 0.9 % IV BOLUS (SEPSIS)
1000.0000 mL | Freq: Once | INTRAVENOUS | Status: AC
  Administered 2016-03-28: 1000 mL via INTRAVENOUS

## 2016-03-28 NOTE — ED Triage Notes (Signed)
Patient presents with upper abdominal pain 3 hours ago and emesis just before arrival to ED.  Patient has colostomy in July following tx for rectal cancer.  Patient denies fever and tenderness to palpation.  Patient states pain feels like "trapped gas."  Patient states BMs have been normal with no melena or apparent blood.  Patient denies blood in vomit.

## 2016-03-28 NOTE — Telephone Encounter (Signed)
Seth Villanueva  08-10-1981 [id number]  Patient Care Team: Leonides Sake, MD as PCP - General (Family Medicine) Kyung Rudd, MD as Consulting Physician (Radiation Oncology) Ladell Pier, MD as Consulting Physician (Oncology) [name], MD as Consulting Physician (General Surgery)  This patient is a 35 y.o.male who calls today for surgical evaluation.   Date of procedure/visit: 6//2017  Surgery: Drainage of pelvic abscess.  Exploratory laparotomy.  Resection of anastomosis and a colostomy  Reason for call: Nausea vomiting and epigastric pain and emergency room.  Called by the emergency room physician, Dr. Adaline Sill.  Patient two months out from operation for abscess and anastomotic break TRAM requiring resection takedown of anastomosis and permanent end colostomy.  Patient had pelvic fluid collections with gas and inflammation.  Gradually recovering.  Had episode of nausea vomiting and upper abdominal pain.  Came to the emergency room.  Was concerned was more than just heartburn or reflux.  He is medically stable.  He is afebrile.  Normal white count.  No peritonitis.  CT scan done that shows some pockets of gas and inflammation but overall improved.  Because there is no evidence of obvious infection or abscess and he is clinically improved, I recommended no further intervention in the pelvis at this time.  Deep appointment follow-up with Dr. Marcello Moores.  Can workup upper abdominal pain possible.  No strong indication of gallstones on ultrasound several years ago.  No evidence of cholecystitis.  No free air perforation upper abdomen consistent with gastritis.  Most likely this is a temporary episode and the patient can be discharged safely if tolerates by mouth.  If not, workup for other etiology but I am skeptical that his pelvis is a major source of problems at this time.  While things are not completely normal by CAT scan, it is improved    Patient Active Problem List   Diagnosis Date Noted   . Protein-calorie malnutrition, severe 11/24/2015  . Dehiscence of intestinal anastomosis 11/23/2015  . Chemotherapy-induced neuropathy ([place]) 11/22/2015  . Fever 11/22/2015  . Diarrhea 11/22/2015  . Unintentional weight change 11/22/2015  . Weakness 11/22/2015  . Nausea without vomiting 11/22/2015  . Hypoalbuminemia due to protein-calorie malnutrition ([place]) 11/22/2015  . 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 Iron deficiency anemia 11/22/2015  . Acute blood loss anemia 11/22/2015  . Genetic testing 06/26/2015  . Small bowel obstruction ([place]) 04/22/2015  . 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 Rectal cancer ([place]) 02/01/2014    Past Medical History:  Diagnosis Date  . Anal abscess   . Anemia in neoplastic disease   . Chemotherapy-induced neuropathy ([place])   . Chronic diarrhea    NEGATIVE C-DIFF 11-22-2015  . Chronic nausea   . Dehisced intestinal anastomosis    rectal  region --  Admitted 11-26-2015  . History of cancer chemotherapy    completed 09-03-2014  . History of ileostomy ([place])    takedown 07-12-2015  . History of phlebitis    08-20-2014---- RIGHT UPPER INNER ARM--  RESOLVED   . Hypoalbuminemia due to protein-calorie malnutrition ([place])   . Incomplete right bundle branch block   . Iron deficiency   . Numbness    feet bilat   . Rectal cancer [facility]) onocologist-  dr Benay Spice   dx 01-31-2015--  Stage III (ypT2, ypN2) invasive--  s/p  radiation (ended 03-28-2014) and chemo (complete 09-03-2014) and low anterior colon resection with ileostomy 05-18-2014  . S/P radiation therapy 02/19/14-03/28/14  rectal  50.4Gy  . Wears glasses     Past Surgical History:  Procedure Laterality Date  . ANAL FISTULOTOMY N/A 12/26/2015   Procedure: ANAL EUA, FISTULOTOMY;  Surgeon: [name], MD;  Location: [facility];  Service: General;  Laterality: N/A;  . COLONOSCOPY W/ BIOPSIES  01/22/2014   fungating  nonobstructing large mass distal rectum  . COLOSTOMY N/A 02/04/2016   Procedure: TAKEDOWN OF SURGICAL ANASTAMOSIS WITH COLOSTOMY AND PELVIC WASHOUT WITH LEFT URETERAL REPAIR;  Surgeon: [name], MD;  Location: [facility];  Service: General;  Laterality: N/A;  . CYSTOSCOPY W/ URETERAL STENT PLACEMENT Left 02/04/2016   Procedure: CYSTOSCOPY WITH LEFT RETROGRADE PYELOGRAM/URETERAL LEFT STENT PLACEMENT;  Surgeon: Ardis Hughs, MD;  Location: [facility];  Service: Urology;  Laterality: Left;  . EUS N/A 01/30/2014   Procedure: LOWER ENDOSCOPIC ULTRASOUND (EUS);  Surgeon: Arta Silence, MD;  Location: [facility];  Service: Endoscopy;  Laterality: N/A;  with biospy's  . FLEXIBLE SIGMOIDOSCOPY N/A 11/15/2014   Procedure: FLEXIBLE SIGMOIDOSCOPY;  Surgeon: [name], MD;  Location[facility];  Service: General;  Laterality: N/A;  . ILEOSTOMY CLOSURE N/A 07/12/2015   Procedure: ILEOSTOMY REVERSAL;  Surgeon: [name], MD;  Location: [facility];  Service: General;  Laterality: N/A;  . LAPAROSCOPY N/A 07/12/2015   Procedure: LAPAROSCOPY DIAGNOSTIC;  Surgeon: [name], MD;  Location: [facility];  Service: General;  Laterality: N/A;  . PORTACATH PLACEMENT Left 05/31/2014   Procedure: INSERTION PORT-A-CATH LET SUBCLAVIAN;  Surgeon: [name], MD;  Location: [facility];  Service: General;  Laterality: Left;  . RECTAL EXAM UNDER ANESTHESIA N/A 11/15/2014   Procedure: ANAL EXAM UNDER ANESTHESIA, RIGID PROTOSCOPY;  Surgeon: [name], MD;  Location: [place];  Service: General;  Laterality: N/A;  . REPAIR OF RECTAL PROLAPSE N/A 04/18/2015   Procedure: RETROGRADE COLONOSCOPY AND DILATION OF ANAL STRICTURE;  Surgeon: [name], MD;  Location: [facility];  Service: General;  Laterality: N/A;  . ROBOT ASSISTED LOW ANTERIOR COLON RESECTION WITH FLEXURE MOBILIZATION /  HAND SEWN COLO-ANAL ANASTOMIOSIS/  DIVERTING LOOP ILEOSTOMY  05-18-2014  . WISDOM TOOTH EXTRACTION  age 44     Social History   Social History  . Marital status: Single    Spouse name: N/A  . Number of children: N/A  . Years of education: N/A   Occupational History  . [place]    [employer]   Social History Main Topics  . Smoking status: Former Smoker    Packs/day: 0.25    Years: 5.00    Types: Cigarettes    Quit date: 08/11/2007  . Smokeless tobacco: Former Systems developer    Types: Snuff, Chew    Quit date: 02/01/2006  . Alcohol use No  . Drug use: No  . Sexual activity: Not on file   Other Topics Concern  . 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 Breast cancer Maternal Aunt     pat mat great aunt; currently 69  . Leukemia Maternal Aunt     pat mat great aunt; deceased 68s  . Other Paternal Aunt     colon polyps (type/#?)    No current facility-administered medications for this visit.    Current Outpatient Prescriptions  Medication Sig Dispense Refill  . acetaminophen (TYLENOL) 325 MG tablet Take 325 mg by mouth once  as needed for mild pain (pain). Reported on 07/29/2015    . aspirin-sod bicarb-citric acid (ALKA-SELTZER) 325 MG TBEF tablet Take 325 mg by mouth every 6 (six) hours as needed (nausea).    . bismuth subsalicylate (PEPTO BISMOL) 262 MG/15ML suspension Take 30 mLs by mouth daily as needed for diarrhea or loose stools.    . cetirizine (ZYRTEC) 10 MG tablet Take 10 mg by mouth daily as needed for allergies.    . diphenoxylate-atropine (LOMOTIL) 2.5-0.025 MG tablet Take 1 tablet by mouth 4 (four) times daily as needed for diarrhea or loose stools.    Marland Kitchen loperamide (IMODIUM) 2 MG capsule Take 4 mg by mouth every 6 (six) hours as needed for diarrhea or loose stools.    . ondansetron (ZOFRAN) 4 MG tablet Take 1 tablet (4 mg total) by mouth every 8 (eight) hours as needed for nausea or vomiting. (Patient not taking: Reported on 03/28/2016) 40 tablet 1  . oxyCODONE (OXY  IR/ROXICODONE) 5 MG immediate release tablet Take 1-2 tablets (5-10 mg total) by mouth every 4 (four) hours as needed for moderate pain, severe pain or breakthrough pain. 30 tablet 0  . simethicone (MYLICON) 80 MG chewable tablet Chew 80 mg by mouth every 6 (six) hours as needed for flatulence.     Facility-Administered Medications Ordered in Other Visits  Medication Dose Route Frequency Provider Last Rate Last Dose  . heparin lock flush 100 unit/mL  500 Units Intravenous Once Ladell Pier, MD      . sodium chloride 0.9 % bolus 1,000 mL  1,000 mL Intravenous Once Ezequiel Essex, MD      . sodium chloride 0.9 % injection 10 mL  10 mL Intravenous PRN Ladell Pier, MD         Allergies  Allergen Reactions  . Zithromax [Azithromycin] Hives  . Compazine [Prochlorperazine] Anxiety    @VS @  Ct Abdomen Pelvis W Contrast  Result Date: 03/28/2016 CLINICAL DATA:  Abdominal pain with emesis. History of rectal carcinoma with ostomy present. EXAM: CT ABDOMEN AND PELVIS WITH CONTRAST TECHNIQUE: Multidetector CT imaging of the abdomen and pelvis was performed using the standard protocol following bolus administration of intravenous contrast. Oral contrast was also administered. CONTRAST:  142mL ISOVUE-300 IOPAMIDOL (ISOVUE-300) INJECTION 61% COMPARISON:  January 31, 2016 FINDINGS: Lower chest:  Lung bases are clear. Hepatobiliary: No focal liver lesions are identified. Gallbladder wall is not appreciably thickened. There is no biliary duct dilatation. Pancreas: No pancreatic mass or inflammatory focus. Spleen: No splenic lesions are evident. Adrenals/Urinary Tract: Adrenals appear normal bilaterally. There is no renal mass on either side. There is mild fullness of each renal collecting system. There is no renal or ureteral calculus on either side. The urinary bladder is somewhat distended with wall thickness within normal limits. Stomach/Bowel: The patient has had a partial colectomy. There is a patent left  lower quadrant ostomy. There is currently no bowel wall or mesenteric thickening. There is no demonstrable bowel obstruction. There is no pneumoperitoneum or portal venous air. Vascular/Lymphatic: There is no abdominal aortic aneurysm. No vascular lesions are evident on this study. There are stable small left-sided retroperitoneal lymph nodes. There is no adenopathy by size criteria. Reproductive: Prostate and seminal vesicles appear unremarkable. Other: In comparison with the previous study, there is less air in the posterior pelvis which was felt to be of inflammatory etiology on the previous study. There do remain scattered foci of air in the perirectal postoperative region, concerning for residual inflammatory foci  in this area. There is soft tissue stranding in the presacral region which appears essentially stable in size but less inhomogeneous in attenuation there is a small fluid collection immediately posterior to the urinary bladder in the lower pelvis measuring 1.1 x 0.7 cm which may represent residual of inflammation/minimal abscess. No other findings suggesting abscess is seen currently. There is surrounding soft tissue stranding. The overall appearance suggests partial but incomplete resolution of inflammation in this area of presacral soft tissue scarring. There remains soft tissue thickening in the inferior lateral pelvic wall regions with resolution of air in these areas laterally. Appendix is not seen and may be absent. There is no periappendiceal region inflammation. There is no well-defined abscess currently. No ascites. 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 There is no well-defined bony destruction in this area, and this appearance has not progressed from the previous study. Elsewhere bony structures appear unremarkable. There is no intramuscular  lesion. No abdominal wall lesion beyond the left lower quadrant ostomy. IMPRESSION: In comparison with the most recent prior study, there appears to be less presacral region/lower pelvic inflammation and air. There is some residual air in the periapical rectal region which may be of postoperative etiology. At least some of this air could have inflammatory etiology, however. Soft tissue thickening in the presacral region may be seen as a consequence of the previous surgical procedures and prior radiation therapy. A small focus of fluid posterior to the urinary bladder with surrounding inflammation may represent rather minimal residual abscess, best axial slice 67 series 2. Note that there is less inhomogeneity elsewhere in the areas of presacral and lower lateral pelvic wall thickening, suggesting overall put there has been diminution of inflammation in the pelvis compared to the previous study. No bowel obstruction. There is distention of the urinary bladder with mild fullness of each renal collecting system. Question a degree of bladder outlet obstruction given the degree of urinary bladder distention. There are persistent small retroperitoneal lymph nodes on the left. While these lymph nodes do not meet size criteria for pathologic significance. The presence of these lymph nodes given recent rectal carcinoma raise concern for potential neoplastic involvement. This finding may warrant nuclear medicine PET study to further assess. No new lymph node prominence evident. No liver lesions are appreciable by CT. Electronically Signed   By: Lowella Grip III M.D.   On: 03/28/2016 11:36    Note: This dictation was prepared with Dragon/digital dictation along with [employer]. Any transcriptional errors that result from this process are unintentional.

## 2016-03-28 NOTE — ED Provider Notes (Signed)
Medical screening examination/treatment/procedure(s) were conducted as a shared visit with non-physician practitioner(s) and myself.  I personally evaluated the patient during the encounter.  Patient with complex surgical history including colostomy after anastomosis breakdown from previous rectal surgery presenting with epigastric pain nausea and vomiting similar to previous episodes of indigestion. Now feels fine. Normal colostomy output. No fever. No chest pain or SOB.  Patient has well-healed scars to his abdomen. Colostomy in place with pink stoma. Abdomen soft without peritoneal signs. He appears well.  Labs are reassuring. CT scan discussed with Dr. gross of surgery who agrees that appears much improved since June and some mild inflammation is expected given his complicated course. There is no drainable fluid collection. No fever or leukocytosis. No antibiotics recommended.  Patient informed of retroperitoneal lymph nodes and need to follow-up with his oncologist for further evaluation of this.  He is tolerating by mouth and ambulatory.   EKG Interpretation None       Complex surgical history: distal rectal cancer s/p XRT and robotic assisted LAR, hand sewn colo anal anastomosis and diverting loop ileostomy 05/2014 -coloanal stricture requiring dilation 9/16, -ileostomy reversal 07/2015. -hospitalized 11/2015 for disruption of rectal anastomosis and treated with antibiotics -EUA and fistulotomy 12/26/15    Ezequiel Essex, MD 03/28/16 1554

## 2016-03-28 NOTE — ED Provider Notes (Signed)
[name] DEPT Provider Note   CSN: [id number] Arrival date & time: 03/28/16  0746     History   Chief Complaint Chief Complaint  Patient presents with  . Abdominal Pain  . Emesis    HPI Seth Villanueva is a 35 y.o. male who presents with abdominal pain. PMH significant for rectal cancer s/p resection and colostomy. He states that last night he was woken up with a pain in the epigastric area. It felt like indigestion so he tried laying in different positions, walking, and taking Alka-seltzer with no relief. The pain became worse therefore he came to the ED. He reports he had an episode of N/V which caused the pain to feel better. He states the pain has now moved to the mid abdomen. Denies fever, chills, chest pain, SOB, irritative voiding symptoms. He is having stool output in colostomy. No blood in stool.  HPI  Past Medical History:  Diagnosis Date  . Anal abscess   . Anemia in neoplastic disease   . Chemotherapy-induced neuropathy ([place])   . Chronic diarrhea    NEGATIVE C-DIFF 11-22-2015  . Chronic nausea   . Dehisced intestinal anastomosis    rectal  region --  Admitted 11-26-2015  . History of cancer chemotherapy    completed 09-03-2014  . History of ileostomy ([place])    takedown 07-12-2015  . History of phlebitis    08-20-2014---- RIGHT UPPER INNER ARM--  RESOLVED   . Hypoalbuminemia due to protein-calorie malnutrition ([place])   . Incomplete right bundle branch block   . Iron deficiency   . Numbness    feet bilat   . Rectal cancer [facility]) onocologist-  dr Benay Spice   dx 01-31-2015--  Stage III (ypT2, ypN2) invasive--  s/p  radiation (ended 03-28-2014) and chemo (complete 09-03-2014) and low anterior colon resection with ileostomy 05-18-2014  . S/P radiation therapy 02/19/14-03/28/14   rectal  50.4Gy  . Wears glasses     Patient Active Problem List   Diagnosis Date Noted  . Protein-calorie malnutrition, severe 11/24/2015  . Dehiscence of intestinal anastomosis  11/23/2015  . Chemotherapy-induced neuropathy ([place]) 11/22/2015  . Fever 11/22/2015  . Diarrhea 11/22/2015  . Unintentional weight change 11/22/2015  . Weakness 11/22/2015  . Nausea without vomiting 11/22/2015  . Hypoalbuminemia due to protein-calorie malnutrition ([place]) 11/22/2015  . 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 Iron deficiency anemia 11/22/2015  . Acute blood loss anemia 11/22/2015  . Genetic testing 06/26/2015  . Small bowel obstruction ([place]) 04/22/2015  . 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 Rectal cancer ([place]) 02/01/2014    Past Surgical History:  Procedure Laterality Date  . ANAL FISTULOTOMY N/A 12/26/2015   Procedure: ANAL EUA, FISTULOTOMY;  Surgeon: [name], MD;  Location: [facility];  Service: General;  Laterality: N/A;  . COLONOSCOPY W/ BIOPSIES  01/22/2014   fungating nonobstructing large mass distal rectum  . COLOSTOMY N/A 02/04/2016   Procedure: TAKEDOWN OF SURGICAL ANASTAMOSIS WITH COLOSTOMY AND PELVIC WASHOUT WITH LEFT URETERAL REPAIR;  Surgeon: [name], MD;  Location: [facility];  Service: General;  Laterality: N/A;  . CYSTOSCOPY W/ URETERAL STENT PLACEMENT Left 02/04/2016   Procedure: CYSTOSCOPY WITH LEFT RETROGRADE PYELOGRAM/URETERAL LEFT STENT PLACEMENT;  Surgeon: Ardis Hughs, MD;  Location: [facility];  Service: Urology;  Laterality: Left;  . EUS N/A 01/30/2014   Procedure: LOWER ENDOSCOPIC ULTRASOUND (EUS);  Surgeon: Arta Silence, MD;  Location: [facility];  Service: Endoscopy;  Laterality: N/A;  with biospy's  . FLEXIBLE SIGMOIDOSCOPY N/A 11/15/2014   Procedure: FLEXIBLE SIGMOIDOSCOPY;  Surgeon: [name], MD;  Location: [facility];  Service: General;  Laterality: N/A;  . ILEOSTOMY CLOSURE N/A 07/12/2015   Procedure: ILEOSTOMY REVERSAL;  Surgeon: [name], MD;  Location: [facility];  Service: General;  Laterality: N/A;  . LAPAROSCOPY N/A 07/12/2015    Procedure: LAPAROSCOPY DIAGNOSTIC;  Surgeon: [name], MD;  Location: [facility];  Service: General;  Laterality: N/A;  . PORTACATH PLACEMENT Left 05/31/2014   Procedure: INSERTION PORT-A-CATH LET SUBCLAVIAN;  Surgeon: [name], MD;  Location: [facility];  Service: General;  Laterality: Left;  . RECTAL EXAM UNDER ANESTHESIA N/A 11/15/2014   Procedure: ANAL EXAM UNDER ANESTHESIA, RIGID PROTOSCOPY;  Surgeon: [name], MD;  Location: [facility];  Service: General;  Laterality: N/A;  . REPAIR OF RECTAL PROLAPSE N/A 04/18/2015   Procedure: RETROGRADE COLONOSCOPY AND DILATION OF ANAL STRICTURE;  Surgeon: [name], MD;  Location: [facility];  Service: General;  Laterality: N/A;  . ROBOT ASSISTED LOW ANTERIOR COLON RESECTION WITH FLEXURE MOBILIZATION /  HAND SEWN COLO-ANAL ANASTOMIOSIS/  DIVERTING LOOP ILEOSTOMY  05-18-2014  . WISDOM TOOTH EXTRACTION  age 40       Home Medications    Prior to Admission medications   Medication Sig Start Date End Date Taking? Authorizing Provider  acetaminophen (TYLENOL) 325 MG tablet Take 325 mg by mouth once as needed for mild pain (pain). Reported on 07/29/2015   Yes Historical Provider, MD  aspirin-sod bicarb-citric acid (ALKA-SELTZER) 325 MG TBEF tablet Take 325 mg by mouth every 6 (six) hours as needed (nausea).   Yes Historical Provider, MD  bismuth subsalicylate (PEPTO BISMOL) 262 MG/15ML suspension Take 30 mLs by mouth daily as needed for diarrhea or loose stools.   Yes Historical Provider, MD  cetirizine (ZYRTEC) 10 MG tablet Take 10 mg by mouth daily as needed for allergies.   Yes Historical Provider, MD  diphenoxylate-atropine (LOMOTIL) 2.5-0.025 MG tablet Take 1 tablet by mouth 4 (four) times daily as needed for diarrhea or loose stools.   Yes Historical Provider, MD  loperamide (IMODIUM) 2 MG capsule Take 4 mg by mouth every 6 (six) hours as needed for diarrhea or loose stools.   Yes Historical Provider, MD  oxyCODONE (OXY  IR/ROXICODONE) 5 MG immediate release tablet Take 1-2 tablets (5-10 mg total) by mouth every 4 (four) hours as needed for moderate pain, severe pain or breakthrough pain. [id number]  Yes [name], MD  simethicone (MYLICON) 80 MG chewable tablet Chew 80 mg by mouth every 6 (six) hours as needed for flatulence.   Yes Historical Provider, MD  ondansetron (ZOFRAN) 4 MG tablet Take 1 tablet (4 mg total) by mouth every 8 (eight) hours as needed for nausea or vomiting. Patient not taking: Reported on [id number]   [name], MD    Family History Family History  Problem Relation Age of Onset  . Breast cancer Maternal Aunt     pat mat great aunt; currently 47  . Leukemia Maternal Aunt     pat mat great aunt; deceased 102s  . Other Paternal Aunt     colon polyps (type/#?)    Social History Social History  Substance Use Topics  . Smoking status: Former Smoker    Packs/day: 0.25    Years: 5.00    Types: Cigarettes    Quit date: 08/11/2007  . Smokeless tobacco: Former Systems developer    Types:  Snuff, Chew    Quit date: 02/01/2006  . Alcohol use No     Allergies   Zithromax [azithromycin] and Compazine [prochlorperazine]   Review of Systems Review of Systems  Constitutional: Negative for appetite change, chills and fever.  Respiratory: Negative for shortness of breath.   Cardiovascular: Negative for chest pain.  Gastrointestinal: Positive for abdominal pain, nausea and vomiting. Negative for abdominal distention, constipation and diarrhea.  Genitourinary: Negative for dysuria, frequency and urgency.  All other systems reviewed and are negative.    Physical Exam Updated Vital Signs BP 125/89 (BP Location: Right Arm)   Pulse 85   Temp 98 F (36.7 C) (Oral)   Resp 20   Ht 6\' 2"  (1.88 m)   Wt 63.5 kg   SpO2 100%   BMI 17.97 kg/m   Physical Exam  Constitutional: He is oriented to person, place, and time. He appears well-developed and well-nourished. No distress.  HENT:  Head:  Normocephalic and atraumatic.  Eyes: Conjunctivae are normal. Pupils are equal, round, and reactive to light. Right eye exhibits no discharge. Left eye exhibits no discharge. No scleral icterus.  Neck: Normal range of motion. Neck supple.  Cardiovascular: Normal rate and regular rhythm.  Exam reveals no gallop and no friction rub.   No murmur heard. Pulmonary/Chest: Effort normal and breath sounds normal. No respiratory distress. He has no wheezes. He has no rales. He exhibits no tenderness.  Abdominal: Soft. Bowel sounds are normal. He exhibits no distension and no mass. There is tenderness. There is no rebound and no guarding. No hernia.  No epigastric pain; pain in mid abdomen. Colostomy in place, site is clean and dry. Stoma is pink  Musculoskeletal: He exhibits no edema.  Neurological: He is alert and oriented to person, place, and time.  Skin: Skin is warm and dry.  Psychiatric: He has a normal mood and affect.  Nursing note and vitals reviewed.    ED Treatments / Results  Labs (all labs ordered are listed, but only abnormal results are displayed) Labs Reviewed  COMPREHENSIVE METABOLIC PANEL - Abnormal; Notable for the following:       Result Value   Potassium 3.4 (*)    Glucose, Bld 103 (*)    ALT 15 (*)    All other components within normal limits  CBC WITH DIFFERENTIAL/PLATELET - Abnormal; Notable for the following:    Hemoglobin 11.5 (*)    HCT 35.0 (*)    RDW 18.5 (*)    Neutro Abs 8.2 (*)    Lymphs Abs 0.5 (*)    All other components within normal limits  LIPASE, BLOOD  URINALYSIS, ROUTINE W REFLEX MICROSCOPIC (NOT AT [facility])    EKG  EKG Interpretation None       Radiology Ct Abdomen Pelvis W Contrast  Result Date: 03/28/2016 CLINICAL DATA:  Abdominal pain with emesis. History of rectal carcinoma with ostomy present. EXAM: CT ABDOMEN AND PELVIS WITH CONTRAST TECHNIQUE: Multidetector CT imaging of the abdomen and pelvis was performed using the standard protocol  following bolus administration of intravenous contrast. Oral contrast was also administered. CONTRAST:  183mL ISOVUE-300 IOPAMIDOL (ISOVUE-300) INJECTION 61% COMPARISON:  January 31, 2016 FINDINGS: Lower chest:  Lung bases are clear. Hepatobiliary: No focal liver lesions are identified. Gallbladder wall is not appreciably thickened. There is no biliary duct dilatation. Pancreas: No pancreatic mass or inflammatory focus. Spleen: No splenic lesions are evident. Adrenals/Urinary Tract: Adrenals appear normal bilaterally. There is no renal mass on either side. There  is mild fullness of each renal collecting system. There is no renal or ureteral calculus on either side. The urinary bladder is somewhat distended with wall thickness within normal limits. Stomach/Bowel: The patient has had a partial colectomy. There is a patent left lower quadrant ostomy. There is currently no bowel wall or mesenteric thickening. There is no demonstrable bowel obstruction. There is no pneumoperitoneum or portal venous air. Vascular/Lymphatic: There is no abdominal aortic aneurysm. No vascular lesions are evident on this study. There are stable small left-sided retroperitoneal lymph nodes. There is no adenopathy by size criteria. Reproductive: Prostate and seminal vesicles appear unremarkable. Other: In comparison with the previous study, there is less air in the posterior pelvis which was felt to be of inflammatory etiology on the previous study. There do remain scattered foci of air in the perirectal postoperative region, concerning for residual inflammatory foci in this area. There is soft tissue stranding in the presacral region which appears essentially stable in size but less inhomogeneous in attenuation there is a small fluid collection immediately posterior to the urinary bladder in the lower pelvis measuring 1.1 x 0.7 cm which may represent residual of inflammation/minimal abscess. No other findings suggesting abscess is seen  currently. There is surrounding soft tissue stranding. The overall appearance suggests partial but incomplete resolution of inflammation in this area of presacral soft tissue scarring. There remains soft tissue thickening in the inferior lateral pelvic wall regions with resolution of air in these areas laterally. Appendix is not seen and may be absent. There is no periappendiceal region inflammation. There is no well-defined abscess currently. No ascites. 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 There is no well-defined bony destruction in this area, and this appearance has not progressed from the previous study. Elsewhere bony structures appear unremarkable. There is no intramuscular lesion. No abdominal wall lesion beyond the left lower quadrant ostomy. IMPRESSION: In comparison with the most recent prior study, there appears to be less presacral region/lower pelvic inflammation and air. There is some residual air in the periapical rectal region which may be of postoperative etiology. At least some of this air could have inflammatory etiology, however. Soft tissue thickening in the presacral region may be seen as a consequence of the previous surgical procedures and prior radiation therapy. A small focus of fluid posterior to the urinary bladder with surrounding inflammation may represent rather minimal residual abscess, best axial slice 67 series 2. Note that there is less inhomogeneity elsewhere in the areas of presacral and lower lateral pelvic wall thickening, suggesting overall put there has been diminution of inflammation in the pelvis compared to the previous study. No bowel obstruction. There is distention of the urinary bladder with mild fullness of each renal collecting system. Question a degree of bladder outlet obstruction given the degree of urinary  bladder distention. There are persistent small retroperitoneal lymph nodes on the left. While these lymph nodes do not meet size criteria for pathologic significance. The presence of these lymph nodes given recent rectal carcinoma raise concern for potential neoplastic involvement. This finding may warrant nuclear medicine PET study to further assess. No new lymph node prominence evident. No liver lesions are appreciable by CT. Electronically Signed   By: Lowella Grip III M.D.   On: 03/28/2016 11:36    Procedures Procedures (including critical care time)  Medications Ordered in ED Medications  ondansetron (ZOFRAN) injection  4 mg (4 mg Intravenous Given 03/28/16 0933)  simethicone (MYLICON) chewable tablet 80 mg (80 mg Oral Given 03/28/16 0942)  diatrizoate meglumine-sodium (GASTROGRAFIN) 66-10 % solution 15 mL (15 mLs Oral Given 03/28/16 1009)  iopamidol (ISOVUE-300) 61 % injection 100 mL (100 mLs Intravenous Contrast Given 03/28/16 1103)  potassium chloride SA (K-DUR,KLOR-CON) CR tablet 40 mEq (40 mEq Oral Given 03/28/16 1137)  sodium chloride 0.9 % bolus 1,000 mL (0 mLs Intravenous Stopped 03/28/16 1512)  heparin lock flush 100 unit/mL (500 Units Intracatheter Given 03/28/16 1518)    Initial Impression / Assessment and Plan / ED Course  I have reviewed the triage vital signs and the nursing notes.  Pertinent labs & imaging results that were available during my care of the patient were reviewed by me and considered in my medical decision making (see chart for details).  Clinical Course   35 year old male presents with abdominal pain. Patient is afebrile, not tachycardic or tachypneic, normotensive, and not hypoxic. CBC remarkable for anemia which appears improved since last checked in July. CMP remarkable for mild hypokalemia. 79mEq of K given since he has a hx of hypokalemia. UA is clean. Zofran and Mylicon given for symptoms. IVF given.  On recheck, he reports feeling much better. CT of  abdomen shows improvement when compared to prior study. There is less presacral region/lower pelvic inflammation and air. A small focus of fluid posterior to the urinary bladder with surrounding inflammation may represent rather minimal residual abscess. There is less inhomogeneity elsewhere in the areas of presacral and lower lateral pelvic wall thickening, suggesting overall put there has been diminution of inflammation in the pelvis compared to the previous study. No bowel obstruction. Some bladder distension. There are persistent small retroperitoneal lymph nodes on the left. Patient notified of results.   Shared visit with Dr. Wyvonnia Dusky who spoke with Dr. Johney Maine. Since CT appears overall improved when compared to last, vitals are stable, labs are unremarkable, and patient symptoms improved, will d/c with symptomatic care. Patient and family feel comfortable with plan. Strict return precautions given. Patient is NAD, non-toxic, with stable VS. Patient is informed of clinical course, understands medical decision making process, and agrees with plan. Opportunity for questions provided and all questions answered. Return precautions given.   Final Clinical Impressions(s) / ED Diagnoses   Final diagnoses:  Non-intractable vomiting with nausea, vomiting of unspecified type  Generalized abdominal pain    New Prescriptions Discharge Medication List as of 03/28/2016  2:11 PM       Recardo Evangelist, PA-C 03/29/16 [place], MD 03/29/16 [phone]

## 2016-04-03 ENCOUNTER — Telehealth: Payer: Self-pay | Admitting: Oncology

## 2016-04-03 NOTE — Telephone Encounter (Signed)
[facility] - MOVED 8/29 APPOINTMENTS TO 9/22. PER DR SHERRILL OK TO MOVE APPOINTMENT OUT. LEFT MESSAGE FOR PATIENT RE CHANGE WITH NEW DATE/TIME FOR 9/22 @ 9 AM. SCHEDULE MAILED.

## 2016-04-07 ENCOUNTER — Ambulatory Visit: Payer: 59 | Admitting: Oncology

## 2016-05-01 ENCOUNTER — Telehealth: Payer: Self-pay | Admitting: Oncology

## 2016-05-01 ENCOUNTER — Ambulatory Visit (HOSPITAL_BASED_OUTPATIENT_CLINIC_OR_DEPARTMENT_OTHER): Payer: 59 | Admitting: Oncology

## 2016-05-01 ENCOUNTER — Ambulatory Visit (HOSPITAL_BASED_OUTPATIENT_CLINIC_OR_DEPARTMENT_OTHER): Payer: 59

## 2016-05-01 VITALS — BP 129/80 | HR 63 | Temp 98.2°F | Resp 17 | Ht 74.0 in | Wt 149.5 lb

## 2016-05-01 DIAGNOSIS — Z85048 Personal history of other malignant neoplasm of rectum, rectosigmoid junction, and anus: Secondary | ICD-10-CM

## 2016-05-01 DIAGNOSIS — Z95828 Presence of other vascular implants and grafts: Secondary | ICD-10-CM | POA: Insufficient documentation

## 2016-05-01 DIAGNOSIS — G620 Drug-induced polyneuropathy: Secondary | ICD-10-CM

## 2016-05-01 DIAGNOSIS — Z933 Colostomy status: Secondary | ICD-10-CM

## 2016-05-01 DIAGNOSIS — C20 Malignant neoplasm of rectum: Secondary | ICD-10-CM

## 2016-05-01 MED ORDER — HEPARIN SOD (PORK) LOCK FLUSH 100 UNIT/ML IV SOLN
500.0000 [IU] | INTRAVENOUS | Status: AC | PRN
  Administered 2016-05-01: 500 [IU]
  Filled 2016-05-01: qty 5

## 2016-05-01 MED ORDER — SODIUM CHLORIDE 0.9 % IJ SOLN
10.0000 mL | INTRAMUSCULAR | Status: AC | PRN
  Administered 2016-05-01: 10 mL
  Filled 2016-05-01: qty 10

## 2016-05-01 NOTE — Patient Instructions (Signed)

## 2016-05-01 NOTE — Progress Notes (Signed)
  [facility] OFFICE PROGRESS NOTE   Diagnosis: Rectal cancer  INTERVAL HISTORY:   Seth Villanueva returns for scheduled visit. He underwent placement of a permanent colostomy after he developed a pelvic abscess in June. He underwent repair of the left ureter and placement of a ureter stent. Seth Villanueva reports his fever has resolved. He now feels well. He is gaining weight. No pain. He had urinary urgency after surgery, this has improved. The ureter stent has been removed. He plans on returning to work in early October.  Objective:  Vital signs in last 24 hours:  Blood pressure 129/80, pulse 63, temperature 98.2 F (36.8 C), temperature source Oral, resp. rate 17, height 6' 2" (1.88 m), weight 149 lb 8 oz (67.8 kg), SpO2 100 %.    HEENT: Neck without mass Lymphatics: Soft "shotty "bilateral posterior cervical nodes. No supra-clavicular, axillary, or inguinal nodes Resp: Lungs clear bilaterally Cardio: Regular rate and rhythm GI: No hepatosplenomegaly, no mass, left lower quadrant colostomy, nontender Vascular: No leg edema  Portacath/PICC-without erythema  Lab Results:  Lab Results  Component Value Date   WBC 9.1 03/28/2016   HGB 11.5 (L) 03/28/2016   HCT 35.0 (L) 03/28/2016   MCV 79.7 03/28/2016   PLT 234 03/28/2016   NEUTROABS 8.2 (H) 03/28/2016   CEA on 01/28/2016-less than 0.5  Medications: I have reviewed the patient's current medications.  Assessment/Plan: 1. 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 Completion of radiation and concurrent  Xeloda 03/28/2014.  Low anterior resection, diverting loop ileostomy, and coloanal anastomosis 05/18/2014,ypT2,ypN2, no loss of mismatch repair protein expression  Cycle 1 adjuvant FOLFOX 06/11/2014  Cycle 2 adjuvant FOLFOX 06/25/2014  Cycle 3 adjuvant FOLFOX 07/09/2014  Cycle 4 adjuvant FOLFOX 07/23/2014  Cycle 5 adjuvant FOLFOX 08/06/2014  Cycle 6 adjuvant FOLFOX 08/20/2014  Cycle 7 adjuvant FOLFOX 09/03/2014  Cycle 8 adjuvant FOLFOX 09/17/2014-oxaliplatin held  Cycle 9 adjuvant FOLFOX 10/01/2014-oxaliplatin held  CT scans chest/abdomen/pelvis 01/31/2015 with no evidence of recurrence. No liver metastatic disease or pathologic adenopathy identified.  CT abdomen/pelvis 11/22/2015-no evidence of recurrent rectal cancer  CT abdomen/pelvis 03/28/2016-partial resolution of presacral inflammation, stable small left-sided retroperitoneal lymph nodes 2. History of rectal pain secondary to #1. Resolved 3. Phlebitis right upper inner arm 08/20/2014. Resolved. 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 6. Ileostomy takedown procedure 07/12/2015. 7. Admission with a fever 11/22/2015-CT with evidence of dehiscence at the rectal anastomosis  Takedown of the surgical anastomosis with colostomy and pelvic washout 02/04/2016  8. Diarrhea-stool negative for C. difficile 11/22/2015    Disposition:  Seth Villanueva has recovered from the colostomy procedure. No clinical evidence of an ongoing infection. He would like to keep the Port-A-Cath in place until next year. He will return for a Port-A-Cath flush in 6 weeks and an office visit in 12 weeks.  We will plan for surveillance CT scans in approximately 6 months.    Betsy Coder, MD  05/01/2016  3:51 PM

## 2016-05-01 NOTE — Telephone Encounter (Signed)
Gave patient mom avs report and appointments for November and December

## 2016-06-12 ENCOUNTER — Telehealth: Payer: Self-pay | Admitting: Oncology

## 2016-06-12 ENCOUNTER — Ambulatory Visit (HOSPITAL_BASED_OUTPATIENT_CLINIC_OR_DEPARTMENT_OTHER): Payer: 59

## 2016-06-12 DIAGNOSIS — Z85048 Personal history of other malignant neoplasm of rectum, rectosigmoid junction, and anus: Secondary | ICD-10-CM | POA: Diagnosis not present

## 2016-06-12 DIAGNOSIS — Z95828 Presence of other vascular implants and grafts: Secondary | ICD-10-CM

## 2016-06-12 DIAGNOSIS — Z452 Encounter for adjustment and management of vascular access device: Secondary | ICD-10-CM | POA: Diagnosis not present

## 2016-06-12 MED ORDER — HEPARIN SOD (PORK) LOCK FLUSH 100 UNIT/ML IV SOLN
500.0000 [IU] | INTRAVENOUS | Status: AC | PRN
  Administered 2016-06-12: 500 [IU]
  Filled 2016-06-12: qty 5

## 2016-06-12 MED ORDER — SODIUM CHLORIDE 0.9 % IJ SOLN
10.0000 mL | INTRAMUSCULAR | Status: AC | PRN
  Administered 2016-06-12: 10 mL
  Filled 2016-06-12: qty 10

## 2016-06-12 NOTE — Telephone Encounter (Signed)
11/3 appointment time rescheduled per patient request.

## 2016-07-24 ENCOUNTER — Ambulatory Visit (HOSPITAL_BASED_OUTPATIENT_CLINIC_OR_DEPARTMENT_OTHER): Payer: 59 | Admitting: Nurse Practitioner

## 2016-07-24 ENCOUNTER — Telehealth: Payer: Self-pay | Admitting: Oncology

## 2016-07-24 ENCOUNTER — Ambulatory Visit (HOSPITAL_BASED_OUTPATIENT_CLINIC_OR_DEPARTMENT_OTHER): Payer: 59

## 2016-07-24 ENCOUNTER — Other Ambulatory Visit: Payer: 59

## 2016-07-24 VITALS — BP 122/71 | HR 67 | Temp 98.0°F | Resp 18 | Ht 74.0 in | Wt 167.1 lb

## 2016-07-24 DIAGNOSIS — Z85048 Personal history of other malignant neoplasm of rectum, rectosigmoid junction, and anus: Secondary | ICD-10-CM

## 2016-07-24 DIAGNOSIS — C20 Malignant neoplasm of rectum: Secondary | ICD-10-CM

## 2016-07-24 DIAGNOSIS — G620 Drug-induced polyneuropathy: Secondary | ICD-10-CM

## 2016-07-24 DIAGNOSIS — Z95828 Presence of other vascular implants and grafts: Secondary | ICD-10-CM

## 2016-07-24 LAB — CEA (IN HOUSE-CHCC): CEA (CHCC-In House): 1.82 ng/mL (ref 0.00–5.00)

## 2016-07-24 MED ORDER — SODIUM CHLORIDE 0.9 % IJ SOLN
10.0000 mL | INTRAMUSCULAR | Status: AC | PRN
  Administered 2016-07-24: 10 mL
  Filled 2016-07-24: qty 10

## 2016-07-24 MED ORDER — HEPARIN SOD (PORK) LOCK FLUSH 100 UNIT/ML IV SOLN
500.0000 [IU] | INTRAVENOUS | Status: AC | PRN
  Administered 2016-07-24: 500 [IU]
  Filled 2016-07-24: qty 5

## 2016-07-24 NOTE — Progress Notes (Signed)
  [facility] OFFICE PROGRESS NOTE   Diagnosis:  Rectal cancer  INTERVAL HISTORY:   Seth Villanueva returns as scheduled. He feels well. He has a good appetite. He is working full time. Ostomy is functioning normally. No nausea or vomiting. He denies pain. No neuropathy symptoms.  Objective:  Vital signs in last 24 hours:  Blood pressure 122/71, pulse 67, temperature 98 F (36.7 C), temperature source Oral, resp. rate 18, height '6\' 2"'$  (1.88 m), weight 167 lb 1.6 oz (75.8 kg), SpO2 100 %.    HEENT: No neck mass. Lymphatics: No palpable cervical, supraclavicular, axillary or inguinal lymph nodes. Resp: Lungs clear bilaterally. Cardio: Regular rate and rhythm. GI: Abdomen soft and nontender. No hepatomegaly. No mass. Left lower quadrant colostomy. Vascular: No leg edema. Port-A-Cath without erythema.    Lab Results:  Lab Results  Component Value Date   WBC 9.1 03/28/2016   HGB 11.5 (L) 03/28/2016   HCT 35.0 (L) 03/28/2016   MCV 79.7 03/28/2016   PLT 234 03/28/2016   NEUTROABS 8.2 (H) 03/28/2016    Imaging:  No results found.  Medications: I have reviewed the patient's current medications.  Assessment/Plan: 1. 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 Completion of radiation and concurrent Xeloda 03/28/2014.  Low anterior resection, diverting loop ileostomy, and coloanal anastomosis 05/18/2014,ypT2,ypN2, no loss of mismatch repair protein expression  Cycle 1 adjuvant FOLFOX 06/11/2014  Cycle 2 adjuvant FOLFOX 06/25/2014  Cycle 3 adjuvant  FOLFOX 07/09/2014  Cycle 4 adjuvant FOLFOX 07/23/2014  Cycle 5 adjuvant FOLFOX 08/06/2014  Cycle 6 adjuvant FOLFOX 08/20/2014  Cycle 7 adjuvant FOLFOX 09/03/2014  Cycle 8 adjuvant FOLFOX 09/17/2014-oxaliplatin held  Cycle 9 adjuvant FOLFOX 10/01/2014-oxaliplatin held  CT scans chest/abdomen/pelvis 01/31/2015 with no evidence of recurrence. No liver metastatic disease or pathologic adenopathy identified.  CT abdomen/pelvis 11/22/2015-no evidence of recurrent rectal cancer  CT abdomen/pelvis 03/28/2016-partial resolution of presacral inflammation, stable small left-sided retroperitoneal lymph nodes 2. History of rectal pain secondary to #1. Resolved 3. Phlebitis right upper inner arm 08/20/2014. Resolved. 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 6. Ileostomy takedown procedure 07/12/2015. 7. Admission with a fever 11/22/2015-CT with evidence of dehiscence at the rectal anastomosis  Takedown of the surgical anastomosis with colostomy and pelvic washout 02/04/2016  8. Diarrhea-stool negative for C. difficile 11/22/2015   Disposition: Mr. Buenger appears well. He remains in clinical remission from rectal cancer. We will follow-up on the CEA from today. He will return for a Port-A-Cath flush in 6 weeks. We will obtain surveillance CTs in approximately 12 weeks with a follow-up visit one to 2 days later.  He will contact the office prior to his next visit with any problems.  Plan reviewed with Dr. Benay Spice.    Ned Card ANP/GNP-BC   07/24/2016  9:49 AM

## 2016-07-24 NOTE — Telephone Encounter (Signed)
Pt confirmed appt and received avs °

## 2016-07-25 LAB — CEA (PARALLEL TESTING): CEA: 0.6 ng/mL

## 2016-07-27 ENCOUNTER — Telehealth: Payer: Self-pay | Admitting: *Deleted

## 2016-07-27 NOTE — Telephone Encounter (Signed)
-----   Message from Ladell Pier, MD sent at 07/25/2016 11:16 AM EST ----- Please call patient, cea is normal

## 2016-07-27 NOTE — Telephone Encounter (Signed)
Message left on patient's private cell phone to inform him per Dr. Benay Spice that cea is normal.  Instructed patient to call [facility] back with any questions or concerns.

## 2016-08-06 ENCOUNTER — Other Ambulatory Visit: Payer: Self-pay | Admitting: Nurse Practitioner

## 2016-08-09 IMAGING — RF DG RETROGRADE PYELOGRAM
1 series · 3 of 3 positions shown · non-contrast
Comparison: CT 01/31/2016

CLINICAL DATA: Hole or injury to the left ureter with urine
drainage.

EXAM:
RETROGRADE PYELOGRAM

[Series 1: run · 3 of 3 slices shown]
[im 1/3]
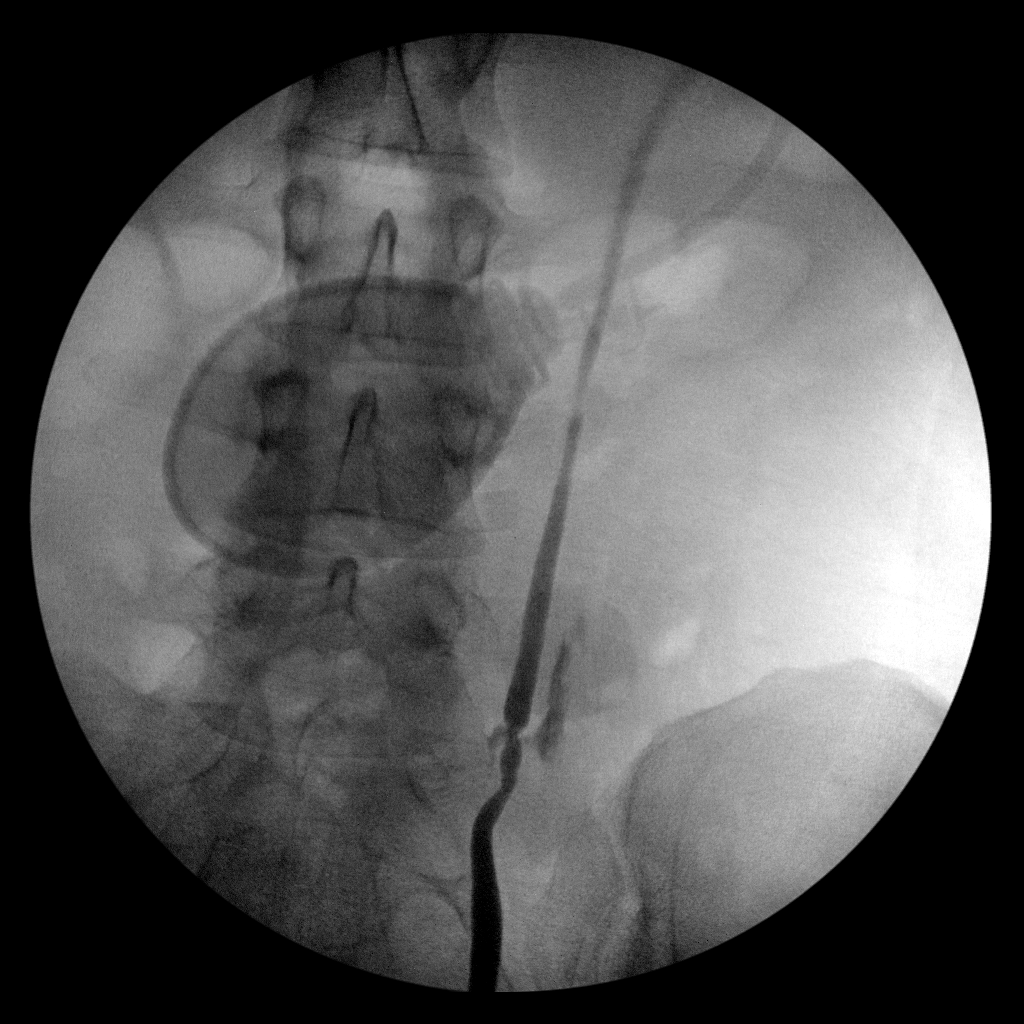
[im 2/3]
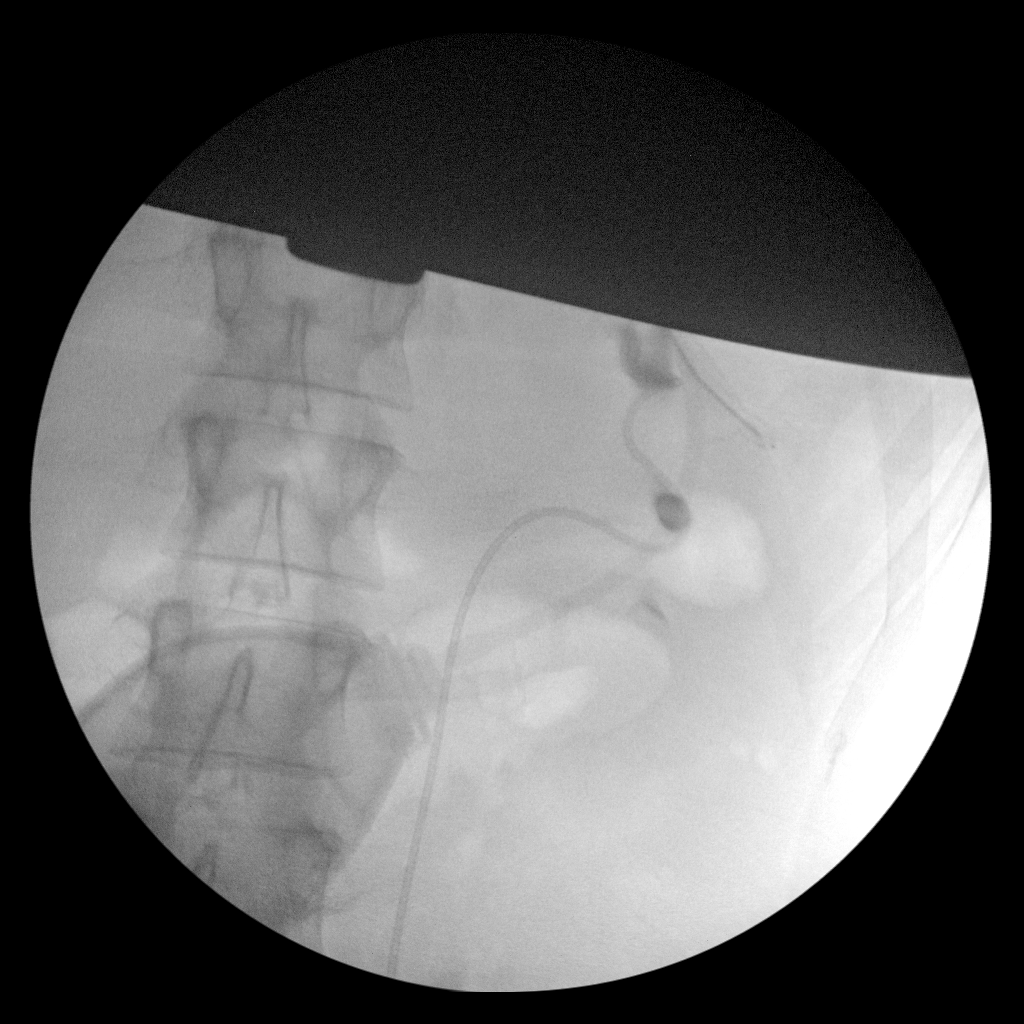
[im 3/3]
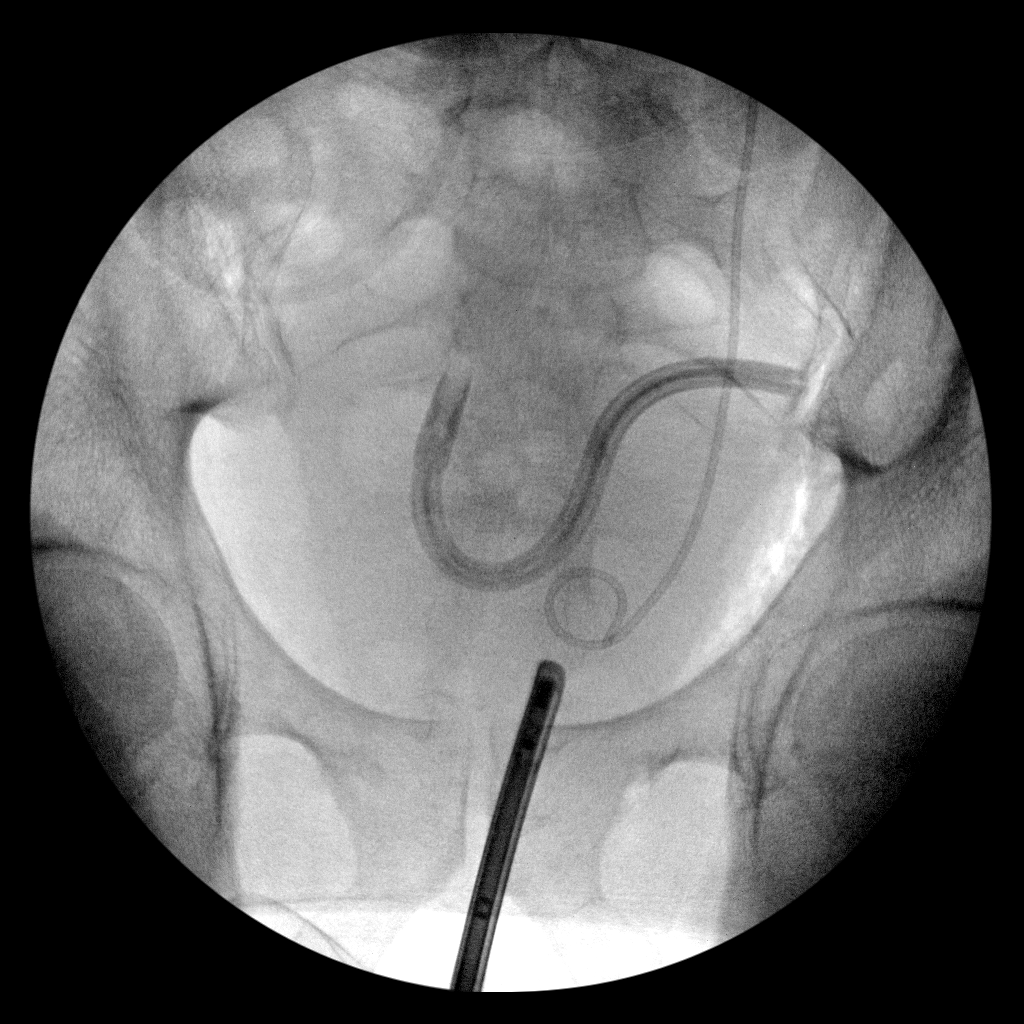

[3 of 3 positions shown; findings below may reference images not displayed]

FINDINGS: Left retrograde examination demonstrates focal narrowing and mild
irregularity of the mid/distal left ureter. There is a small amount
of contrast extravasation compatible with a small leak. A double-J
ureter stent was placed in the left ureter. Evidence for a surgical
drain in the pelvis.
IMPRESSION: Focal abnormality in the mid/distal left ureter with contrast
extravasation.

Placement of left ureter stent.

## 2016-09-11 ENCOUNTER — Ambulatory Visit (HOSPITAL_BASED_OUTPATIENT_CLINIC_OR_DEPARTMENT_OTHER): Payer: 59

## 2016-09-11 DIAGNOSIS — Z85048 Personal history of other malignant neoplasm of rectum, rectosigmoid junction, and anus: Secondary | ICD-10-CM | POA: Diagnosis not present

## 2016-09-11 DIAGNOSIS — Z452 Encounter for adjustment and management of vascular access device: Secondary | ICD-10-CM

## 2016-09-11 DIAGNOSIS — C20 Malignant neoplasm of rectum: Secondary | ICD-10-CM

## 2016-09-11 DIAGNOSIS — Z95828 Presence of other vascular implants and grafts: Secondary | ICD-10-CM

## 2016-09-11 MED ORDER — SODIUM CHLORIDE 0.9% FLUSH
10.0000 mL | INTRAVENOUS | Status: DC | PRN
  Administered 2016-09-11: 10 mL via INTRAVENOUS
  Filled 2016-09-11: qty 10

## 2016-09-11 MED ORDER — HEPARIN SOD (PORK) LOCK FLUSH 100 UNIT/ML IV SOLN
500.0000 [IU] | Freq: Once | INTRAVENOUS | Status: AC | PRN
  Administered 2016-09-11: 500 [IU] via INTRAVENOUS
  Filled 2016-09-11: qty 5

## 2016-09-30 ENCOUNTER — Other Ambulatory Visit: Payer: Self-pay | Admitting: General Surgery

## 2016-10-16 ENCOUNTER — Ambulatory Visit (HOSPITAL_BASED_OUTPATIENT_CLINIC_OR_DEPARTMENT_OTHER): Payer: 59

## 2016-10-16 DIAGNOSIS — C20 Malignant neoplasm of rectum: Secondary | ICD-10-CM

## 2016-10-16 DIAGNOSIS — Z85048 Personal history of other malignant neoplasm of rectum, rectosigmoid junction, and anus: Secondary | ICD-10-CM

## 2016-10-16 DIAGNOSIS — Z95828 Presence of other vascular implants and grafts: Secondary | ICD-10-CM

## 2016-10-16 LAB — COMPREHENSIVE METABOLIC PANEL
ALT: 17 U/L (ref 0–55)
ANION GAP: 8 meq/L (ref 3–11)
AST: 16 U/L (ref 5–34)
Albumin: 4.2 g/dL (ref 3.5–5.0)
Alkaline Phosphatase: 155 U/L — ABNORMAL HIGH (ref 40–150)
BUN: 16.2 mg/dL (ref 7.0–26.0)
CALCIUM: 9.2 mg/dL (ref 8.4–10.4)
CHLORIDE: 108 meq/L (ref 98–109)
CO2: 25 meq/L (ref 22–29)
CREATININE: 1 mg/dL (ref 0.7–1.3)
EGFR: >90 mL/min/{1.73_m2} (ref >90)
Glucose: 104 mg/dl (ref 70–140)
POTASSIUM: 4.1 meq/L (ref 3.5–5.1)
Sodium: 141 mEq/L (ref 136–145)
Total Bilirubin: 0.64 mg/dL (ref 0.20–1.20)
Total Protein: 7.2 g/dL (ref 6.4–8.3)

## 2016-10-16 LAB — CEA (IN HOUSE-CHCC): CEA (CHCC-In House): 1.97 ng/mL (ref 0.00–5.00)

## 2016-10-16 MED ORDER — HEPARIN SOD (PORK) LOCK FLUSH 100 UNIT/ML IV SOLN
500.0000 [IU] | Freq: Once | INTRAVENOUS | Status: AC | PRN
  Administered 2016-10-16: 500 [IU] via INTRAVENOUS
  Filled 2016-10-16: qty 5

## 2016-10-16 MED ORDER — SODIUM CHLORIDE 0.9% FLUSH
10.0000 mL | INTRAVENOUS | Status: DC | PRN
  Administered 2016-10-16: 10 mL via INTRAVENOUS
  Filled 2016-10-16: qty 10

## 2016-10-16 NOTE — Consult Note (Signed)
[facility] Nurse ostomy consult note:  Patient seen via self referral (with Dr. Marcello Moores' permission) for instruction and performance of colostomy irrigation.  Stoma type/location: LLQ Colostomy  Stomal assessment/size: 1 and 5/8 inches round, red, moist with os in center Peristomal assessment: Not seen today Treatment options for stomal/peristomal skin: None indicated Output: Approximately 300 mls of soft brown stool evacuated via colostomy irrigation Ostomy pouching: 2pc.  Education provided: Patient is very motivated to learn colostomy irrigation so that he can resume activities of daily living without the nuisance of periodic pouch emptying. He wishes to travel to the beach next weekend (March 16-18) with friends for a surfing trip. 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 He has obtained a sample of a colostomy irrigation system from the manufacturer of his ostomy supplies, [employer].  Patient is accompanied into a private bathroom for irrigation and seated facing the toilet.  He is instructed in the filling of the [employer] irrigation bag with tepid water, applying the [employer] irrigation sleeve that corresponds with his pouch and insertion of the cone into the stomal os, using water as a lubricant. He experiences the need to "tilt" or "rock" the cone until water flows freely.  738mls of irrigant are instilled stopping one time after 543mls for patient to describe sensation and to determine that he would not experience a valsalva response.  After waiting for 5 minutes with cone in place, another 274mls of irrigant was instilled.  Patient assisted in cone removal and in closure of the irrigation sleeve; he expelled a large bolus of water and approximately 266mls of soft brown stool.  Over the course of another 30 minutes, another 50 mls of stool and the rest of the irrigant was expelled.  Patient tolerated procedure well.  Patient instructed on using the rest of the irrigation fluid in  the irrigation bag to clean the irrigation sleeve and ostomy skin barrier.  He disconnects the sleeve from the skin barrier, pats it dry and attaches a closed end pouch.  We together dry the reusable products, inserting rolled paper toweling into them to continue to absorb water droplets and discourage the formation of mildew.  A carrying bag and another type of irrigation bag Surveyor, minerals) are provided so that he can determine which is best suited to his lifestyle. Patient is instructed on the importance of adequate hydration and that his irrigant may be absorbed rather than expelled if he is dehydrated.  Patient feels confident and hopeful today.  He will text or call during his first independent irrigation and periodically thereafter. Enrolled patient in [employer] program: No. Patient already established with [facility].  [facility] nursing team will not follow routinely, but will remain available to this patient, his family and the nursing, surgical and medical teams.  Please re-consult if needed. Thanks,  Maudie Flakes, MSN, RN, [place], [facility]  Pager# [phone]

## 2016-10-17 LAB — CEA (PARALLEL TESTING): CEA: <0.5 ng/mL

## 2016-10-20 ENCOUNTER — Telehealth: Payer: Self-pay | Admitting: Oncology

## 2016-10-20 ENCOUNTER — Ambulatory Visit (HOSPITAL_BASED_OUTPATIENT_CLINIC_OR_DEPARTMENT_OTHER): Payer: 59 | Admitting: Oncology

## 2016-10-20 VITALS — BP 121/77 | HR 73 | Temp 98.0°F | Resp 18 | Ht 74.0 in | Wt 177.9 lb

## 2016-10-20 DIAGNOSIS — C20 Malignant neoplasm of rectum: Secondary | ICD-10-CM

## 2016-10-20 DIAGNOSIS — Z85048 Personal history of other malignant neoplasm of rectum, rectosigmoid junction, and anus: Secondary | ICD-10-CM

## 2016-10-20 NOTE — Telephone Encounter (Signed)
Gave patient AVS and calender per 10/20/2016 los. 

## 2016-10-20 NOTE — Progress Notes (Signed)
  [facility] OFFICE PROGRESS NOTE   Diagnosis: Rectal cancer  INTERVAL HISTORY:   [name] returns as scheduled. He feels well. He is working full-time. He began irrigating the colostomy last week. He is scheduled for a colonoscopy and Port-A-Cath removal with Dr. Marcello Moores next month.  Objective:  Vital signs in last 24 hours:  Blood pressure 121/77, pulse 73, temperature 98 F (36.7 C), temperature source Oral, resp. rate 18, height '6\' 2"'$  (1.88 m), weight 177 lb 14.4 oz (80.7 kg).    HEENT: Neck without mass Lymphatics: No cervical, supraclavicular, axillary, or inguinal nodes Resp: Lungs clear bilaterally Cardio: Regular rate and rhythm GI: No hepatosplenomegaly, no mass, nontender, left lower quadrant colostomy Vascular: No leg edema   Portacath/PICC-without erythema  Lab Results: CEA on 10/16/2016-1.97  Medications: I have reviewed the patient's current medications.  Assessment/Plan: 1. 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 Completion of radiation and concurrent Xeloda 03/28/2014.  Low anterior resection, diverting loop ileostomy, and coloanal anastomosis 05/18/2014,ypT2,ypN2, no loss of mismatch repair protein expression  Cycle 1 adjuvant FOLFOX 06/11/2014  Cycle 2 adjuvant FOLFOX 06/25/2014  Cycle 3 adjuvant FOLFOX 07/09/2014  Cycle 4 adjuvant FOLFOX 07/23/2014  Cycle 5 adjuvant FOLFOX 08/06/2014  Cycle 6 adjuvant FOLFOX 08/20/2014  Cycle 7 adjuvant FOLFOX 09/03/2014  Cycle 8 adjuvant FOLFOX 09/17/2014-oxaliplatin  held  Cycle 9 adjuvant FOLFOX 10/01/2014-oxaliplatin held  CT scans chest/abdomen/pelvis 01/31/2015 with no evidence of recurrence. No liver metastatic disease or pathologic adenopathy identified.  CT abdomen/pelvis 11/22/2015-no evidence of recurrent rectal cancer  CT abdomen/pelvis 03/28/2016-partial resolution of presacral inflammation, stable small left-sided retroperitoneal lymph nodes 2. History of rectal pain secondary to #1.Resolved 3. Phlebitis right upper inner arm 08/20/2014. Resolved. 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 6. Ileostomy takedown procedure 07/12/2015. 7. Admission with a fever 11/22/2015-CT with evidence of dehiscence at the rectal anastomosis  Takedown of the surgical anastomosis with permanent colostomy and pelvic washout 02/04/2016  8. Diarrhea-stool negative for C. difficile 11/22/2015     Disposition:  Seth Villanueva appears well. He is in clinical remission from rectal cancer. He is scheduled for restaging CT scans on 10/30/2016. He will return for an office visit and CEA in 6 months. Dr. Marcello Moores will remove the Port-A-Cath at the time of the surveillance colonoscopy next month.  15 minutes were spent with the patient today. The majority of the time was used for counseling and coordination of care.  Betsy Coder, MD  10/20/2016  10:04 AM

## 2016-10-30 ENCOUNTER — Encounter (HOSPITAL_COMMUNITY): Payer: Self-pay

## 2016-10-30 ENCOUNTER — Ambulatory Visit (HOSPITAL_COMMUNITY)
Admission: RE | Admit: 2016-10-30 | Discharge: 2016-10-30 | Disposition: A | Discharge status: Home / Self Care | Payer: 59 | Source: Ambulatory Visit | Attending: Nurse Practitioner | Admitting: Nurse Practitioner

## 2016-10-30 DIAGNOSIS — C20 Malignant neoplasm of rectum: Secondary | ICD-10-CM | POA: Insufficient documentation

## 2016-10-30 DIAGNOSIS — Z933 Colostomy status: Secondary | ICD-10-CM | POA: Diagnosis not present

## 2016-10-30 MED ORDER — IOPAMIDOL (ISOVUE-300) INJECTION 61%
INTRAVENOUS | Status: AC
  Administered 2016-10-30: 100 mL
  Filled 2016-10-30: qty 100

## 2016-11-03 ENCOUNTER — Encounter: Payer: Self-pay | Admitting: *Deleted

## 2016-11-03 ENCOUNTER — Telehealth: Payer: Self-pay | Admitting: *Deleted

## 2016-11-03 NOTE — Telephone Encounter (Signed)
-----   Message from Ladell Pier, MD sent at 11/02/2016  6:22 PM EDT ----- Please call patient, CTs are negative for cancer, f/u as scheduled

## 2016-11-03 NOTE — Telephone Encounter (Signed)
"  I'm calling back about my CT scans."  Provided Dr. Gearldine Shown result and next appointment information with th is call.  No further questions.

## 2016-11-03 NOTE — Telephone Encounter (Signed)
Lm for rtn call in regards to CT scan results below

## 2016-11-11 ENCOUNTER — Encounter (HOSPITAL_BASED_OUTPATIENT_CLINIC_OR_DEPARTMENT_OTHER): Payer: Self-pay | Admitting: *Deleted

## 2016-11-12 ENCOUNTER — Encounter (HOSPITAL_BASED_OUTPATIENT_CLINIC_OR_DEPARTMENT_OTHER): Payer: Self-pay | Admitting: *Deleted

## 2016-11-12 NOTE — Progress Notes (Signed)
Pt instructed npo pmn x zyrtec w sip of water. To [facility] 4/12 @ 0600.  Needs hgb on arrival.  Pt to follow office instructions regarding bowel prep.

## 2016-11-19 ENCOUNTER — Encounter (HOSPITAL_BASED_OUTPATIENT_CLINIC_OR_DEPARTMENT_OTHER)
Admission: RE | Disposition: A | Discharge status: Home / Self Care | Payer: Self-pay | Source: Ambulatory Visit | Attending: General Surgery

## 2016-11-19 ENCOUNTER — Ambulatory Visit (HOSPITAL_BASED_OUTPATIENT_CLINIC_OR_DEPARTMENT_OTHER): Payer: 59 | Admitting: Anesthesiology

## 2016-11-19 ENCOUNTER — Ambulatory Visit (HOSPITAL_BASED_OUTPATIENT_CLINIC_OR_DEPARTMENT_OTHER)
Admission: RE | Admit: 2016-11-19 | Discharge: 2016-11-19 | Disposition: A | Discharge status: Home / Self Care | Payer: 59 | Source: Ambulatory Visit | Attending: General Surgery | Admitting: General Surgery

## 2016-11-19 ENCOUNTER — Encounter (HOSPITAL_BASED_OUTPATIENT_CLINIC_OR_DEPARTMENT_OTHER): Payer: Self-pay

## 2016-11-19 DIAGNOSIS — Z8371 Family history of colonic polyps: Secondary | ICD-10-CM | POA: Diagnosis not present

## 2016-11-19 DIAGNOSIS — Z933 Colostomy status: Secondary | ICD-10-CM | POA: Insufficient documentation

## 2016-11-19 DIAGNOSIS — Z87891 Personal history of nicotine dependence: Secondary | ICD-10-CM | POA: Insufficient documentation

## 2016-11-19 DIAGNOSIS — Z6822 Body mass index (BMI) 22.0-22.9, adult: Secondary | ICD-10-CM | POA: Diagnosis not present

## 2016-11-19 DIAGNOSIS — Z806 Family history of leukemia: Secondary | ICD-10-CM | POA: Diagnosis not present

## 2016-11-19 DIAGNOSIS — Z452 Encounter for adjustment and management of vascular access device: Secondary | ICD-10-CM | POA: Insufficient documentation

## 2016-11-19 DIAGNOSIS — E8809 Other disorders of plasma-protein metabolism, not elsewhere classified: Secondary | ICD-10-CM | POA: Diagnosis not present

## 2016-11-19 DIAGNOSIS — Z1211 Encounter for screening for malignant neoplasm of colon: Secondary | ICD-10-CM | POA: Insufficient documentation

## 2016-11-19 DIAGNOSIS — I4510 Unspecified right bundle-branch block: Secondary | ICD-10-CM | POA: Insufficient documentation

## 2016-11-19 DIAGNOSIS — Z803 Family history of malignant neoplasm of breast: Secondary | ICD-10-CM | POA: Diagnosis not present

## 2016-11-19 DIAGNOSIS — Z85048 Personal history of other malignant neoplasm of rectum, rectosigmoid junction, and anus: Secondary | ICD-10-CM | POA: Insufficient documentation

## 2016-11-19 DIAGNOSIS — Z9221 Personal history of antineoplastic chemotherapy: Secondary | ICD-10-CM | POA: Diagnosis not present

## 2016-11-19 DIAGNOSIS — E46 Unspecified protein-calorie malnutrition: Secondary | ICD-10-CM | POA: Insufficient documentation

## 2016-11-19 HISTORY — PX: COLONOSCOPY: SHX5424

## 2016-11-19 HISTORY — PX: PORT-A-CATH REMOVAL: SHX5289

## 2016-11-19 HISTORY — DX: Colostomy status: Z93.3

## 2016-11-19 HISTORY — DX: Other seasonal allergic rhinitis: J30.2

## 2016-11-19 LAB — POCT HEMOGLOBIN-HEMACUE: Hemoglobin: 16.7 g/dL (ref 13.0–17.0)

## 2016-11-19 SURGERY — REMOVAL PORT-A-CATH
Anesthesia: Monitor Anesthesia Care | Site: Chest

## 2016-11-19 MED ORDER — MEPERIDINE HCL 25 MG/ML IJ SOLN
6.2500 mg | INTRAMUSCULAR | Status: DC | PRN
  Filled 2016-11-19: qty 1

## 2016-11-19 MED ORDER — LACTATED RINGERS IV SOLN
INTRAVENOUS | Status: DC
  Administered 2016-11-19: 07:00:00 via INTRAVENOUS
  Filled 2016-11-19: qty 1000

## 2016-11-19 MED ORDER — LIDOCAINE HCL (CARDIAC) 20 MG/ML IV SOLN
INTRAVENOUS | Status: DC | PRN
  Administered 2016-11-19: 60 mg via INTRAVENOUS
  Administered 2016-11-19: 40 mg via INTRAVENOUS

## 2016-11-19 MED ORDER — ACETAMINOPHEN 500 MG PO TABS
1000.0000 mg | ORAL_TABLET | ORAL | Status: AC
  Administered 2016-11-19: 1000 mg via ORAL
  Filled 2016-11-19: qty 2

## 2016-11-19 MED ORDER — MIDAZOLAM HCL 5 MG/5ML IJ SOLN
INTRAMUSCULAR | Status: DC | PRN
  Administered 2016-11-19: 2 mg via INTRAVENOUS

## 2016-11-19 MED ORDER — SODIUM CHLORIDE 0.9% FLUSH
3.0000 mL | Freq: Two times a day (BID) | INTRAVENOUS | Status: DC
  Filled 2016-11-19: qty 3

## 2016-11-19 MED ORDER — OXYCODONE HCL 5 MG PO TABS
5.0000 mg | ORAL_TABLET | ORAL | Status: DC | PRN
  Filled 2016-11-19: qty 2

## 2016-11-19 MED ORDER — GABAPENTIN 300 MG PO CAPS
300.0000 mg | ORAL_CAPSULE | ORAL | Status: AC
  Administered 2016-11-19: 300 mg via ORAL
  Filled 2016-11-19: qty 1

## 2016-11-19 MED ORDER — CELECOXIB 200 MG PO CAPS
ORAL_CAPSULE | ORAL | Status: AC
  Filled 2016-11-19: qty 2

## 2016-11-19 MED ORDER — ACETAMINOPHEN 500 MG PO TABS
ORAL_TABLET | ORAL | Status: AC
  Filled 2016-11-19: qty 2

## 2016-11-19 MED ORDER — CEFAZOLIN SODIUM-DEXTROSE 2-4 GM/100ML-% IV SOLN
2.0000 g | INTRAVENOUS | Status: AC
  Administered 2016-11-19: 2 g via INTRAVENOUS
  Filled 2016-11-19: qty 100

## 2016-11-19 MED ORDER — BUPIVACAINE-EPINEPHRINE (PF) 0.5% -1:200000 IJ SOLN
INTRAMUSCULAR | Status: AC
  Filled 2016-11-19: qty 30

## 2016-11-19 MED ORDER — CEFAZOLIN SODIUM-DEXTROSE 2-4 GM/100ML-% IV SOLN
INTRAVENOUS | Status: AC
  Filled 2016-11-19: qty 100

## 2016-11-19 MED ORDER — LIDOCAINE 2% (20 MG/ML) 5 ML SYRINGE
INTRAMUSCULAR | Status: AC
  Filled 2016-11-19: qty 5

## 2016-11-19 MED ORDER — ACETAMINOPHEN 650 MG RE SUPP
650.0000 mg | RECTAL | Status: DC | PRN
  Filled 2016-11-19: qty 1

## 2016-11-19 MED ORDER — FENTANYL CITRATE (PF) 100 MCG/2ML IJ SOLN
INTRAMUSCULAR | Status: DC | PRN
  Administered 2016-11-19: 25 ug via INTRAVENOUS
  Administered 2016-11-19 (×2): 12.5 ug via INTRAVENOUS

## 2016-11-19 MED ORDER — LACTATED RINGERS IV SOLN
INTRAVENOUS | Status: DC
  Filled 2016-11-19: qty 1000

## 2016-11-19 MED ORDER — ONDANSETRON HCL 4 MG/2ML IJ SOLN
INTRAMUSCULAR | Status: AC
  Filled 2016-11-19: qty 2

## 2016-11-19 MED ORDER — PROPOFOL 500 MG/50ML IV EMUL
INTRAVENOUS | Status: DC | PRN
  Administered 2016-11-19: 100 ug/kg/min via INTRAVENOUS

## 2016-11-19 MED ORDER — ONDANSETRON HCL 4 MG/2ML IJ SOLN
INTRAMUSCULAR | Status: DC | PRN
  Administered 2016-11-19: 4 mg via INTRAVENOUS

## 2016-11-19 MED ORDER — FENTANYL CITRATE (PF) 100 MCG/2ML IJ SOLN
25.0000 ug | INTRAMUSCULAR | Status: DC | PRN
  Filled 2016-11-19: qty 1

## 2016-11-19 MED ORDER — CELECOXIB 400 MG PO CAPS
400.0000 mg | ORAL_CAPSULE | ORAL | Status: AC
  Administered 2016-11-19: 400 mg via ORAL
  Filled 2016-11-19: qty 1

## 2016-11-19 MED ORDER — ACETAMINOPHEN 325 MG PO TABS
650.0000 mg | ORAL_TABLET | ORAL | Status: DC | PRN
  Filled 2016-11-19: qty 2

## 2016-11-19 MED ORDER — BUPIVACAINE-EPINEPHRINE 0.5% -1:200000 IJ SOLN
INTRAMUSCULAR | Status: DC | PRN
  Administered 2016-11-19: 10 mL

## 2016-11-19 MED ORDER — MIDAZOLAM HCL 2 MG/2ML IJ SOLN
INTRAMUSCULAR | Status: AC
  Filled 2016-11-19: qty 2

## 2016-11-19 MED ORDER — SODIUM CHLORIDE 0.9 % IV SOLN
250.0000 mL | INTRAVENOUS | Status: DC | PRN
  Filled 2016-11-19: qty 250

## 2016-11-19 MED ORDER — ARTIFICIAL TEARS OP OINT
TOPICAL_OINTMENT | OPHTHALMIC | Status: AC
  Filled 2016-11-19: qty 3.5

## 2016-11-19 MED ORDER — DEXAMETHASONE SODIUM PHOSPHATE 4 MG/ML IJ SOLN
INTRAMUSCULAR | Status: DC | PRN
  Administered 2016-11-19 (×2): 5 mg via INTRAVENOUS

## 2016-11-19 MED ORDER — SODIUM CHLORIDE 0.9% FLUSH
3.0000 mL | INTRAVENOUS | Status: DC | PRN
Start: 2016-11-19 — End: 2016-11-19
  Filled 2016-11-19: qty 3

## 2016-11-19 MED ORDER — PROPOFOL 500 MG/50ML IV EMUL
INTRAVENOUS | Status: AC
  Filled 2016-11-19: qty 50

## 2016-11-19 MED ORDER — DEXAMETHASONE SODIUM PHOSPHATE 10 MG/ML IJ SOLN
INTRAMUSCULAR | Status: AC
  Filled 2016-11-19: qty 1

## 2016-11-19 MED ORDER — FENTANYL CITRATE (PF) 100 MCG/2ML IJ SOLN
INTRAMUSCULAR | Status: AC
  Filled 2016-11-19: qty 2

## 2016-11-19 MED ORDER — METOCLOPRAMIDE HCL 5 MG/ML IJ SOLN
10.0000 mg | Freq: Once | INTRAMUSCULAR | Status: DC | PRN
  Filled 2016-11-19: qty 2

## 2016-11-19 MED ORDER — GABAPENTIN 300 MG PO CAPS
ORAL_CAPSULE | ORAL | Status: AC
  Filled 2016-11-19: qty 1

## 2016-11-19 SURGICAL SUPPLY — 40 items
BLADE CLIPPER SURG (BLADE) IMPLANT
BLADE HEX COATED 2.75 (ELECTRODE) ×4 IMPLANT
BLADE SURG 15 STRL LF DISP TIS (BLADE) ×2 IMPLANT
BLADE SURG 15 STRL SS (BLADE) ×2
CANISTER SUCT 1200ML W/VALVE (MISCELLANEOUS) IMPLANT
CHLORAPREP W/TINT 26ML (MISCELLANEOUS) ×4 IMPLANT
COVER BACK TABLE 60X90IN (DRAPES) ×4 IMPLANT
COVER MAYO STAND STRL (DRAPES) ×4 IMPLANT
DECANTER SPIKE VIAL GLASS SM (MISCELLANEOUS) IMPLANT
DERMABOND ADVANCED (GAUZE/BANDAGES/DRESSINGS) ×2
DERMABOND ADVANCED .7 DNX12 (GAUZE/BANDAGES/DRESSINGS) ×2 IMPLANT
DRAPE LAPAROTOMY TRNSV 102X78 (DRAPE) ×4 IMPLANT
DRAPE UTILITY XL STRL (DRAPES) ×4 IMPLANT
DRESSING DUODERM 4X4 STERILE (GAUZE/BANDAGES/DRESSINGS) ×4 IMPLANT
ELECT REM PT RETURN 9FT ADLT (ELECTROSURGICAL) ×4
ELECTRODE REM PT RTRN 9FT ADLT (ELECTROSURGICAL) ×2 IMPLANT
GLOVE BIO SURGEON STRL SZ 6.5 (GLOVE) ×6 IMPLANT
GLOVE BIO SURGEONS STRL SZ 6.5 (GLOVE) ×2
GLOVE BIOGEL PI IND STRL 7.0 (GLOVE) ×2 IMPLANT
GLOVE BIOGEL PI INDICATOR 7.0 (GLOVE) ×2
GOWN STRL REUS W/ TWL LRG LVL3 (GOWN DISPOSABLE) ×2 IMPLANT
GOWN STRL REUS W/TWL 2XL LVL3 (GOWN DISPOSABLE) ×4 IMPLANT
GOWN STRL REUS W/TWL LRG LVL3 (GOWN DISPOSABLE) ×2
NEEDLE HYPO 22GX1.5 SAFETY (NEEDLE) ×4 IMPLANT
NEEDLE HYPO 25X1 1.5 SAFETY (NEEDLE) ×4 IMPLANT
NS IRRIG 500ML POUR BTL (IV SOLUTION) ×4 IMPLANT
PACK BASIN DAY SURGERY FS (CUSTOM PROCEDURE TRAY) ×4 IMPLANT
PENCIL BUTTON HOLSTER BLD 10FT (ELECTRODE) ×4 IMPLANT
SLEEVE SCD COMPRESS KNEE MED (MISCELLANEOUS) IMPLANT
SPONGE GAUZE 4X4 12PLY STER LF (GAUZE/BANDAGES/DRESSINGS) ×4 IMPLANT
SPONGE LAP 4X18 X RAY DECT (DISPOSABLE) ×4 IMPLANT
SUT VIC AB 3-0 SH 27 (SUTURE) ×2
SUT VIC AB 3-0 SH 27X BRD (SUTURE) ×2 IMPLANT
SUT VICRYL 4-0 PS2 18IN ABS (SUTURE) ×4 IMPLANT
SYR CONTROL 10ML LL (SYRINGE) ×4 IMPLANT
TOWEL OR 17X24 6PK STRL BLUE (TOWEL DISPOSABLE) ×4 IMPLANT
TUBE CONNECTING 12'X1/4 (SUCTIONS)
TUBE CONNECTING 12X1/4 (SUCTIONS) IMPLANT
YANKAUER SUCT BULB TIP 10FT TU (MISCELLANEOUS) IMPLANT
YANKAUER SUCT BULB TIP NO VENT (SUCTIONS) IMPLANT

## 2016-11-19 NOTE — Discharge Instructions (Addendum)
Post Colonoscopy Instructions  1. DIET: Follow a light bland diet the first 24 hours after arrival home, such as soup, liquids, crackers, etc.  Be sure to include lots of fluids daily.  Avoid fast food or heavy meals as your are more likely to get nauseated.   2. You may have some mild rectal bleeding for the first few days after the procedure.  This should get less and less with time.  Resume any blood thinners 2 days after your procedure unless directed otherwise by your physician. 3. Take your usually prescribed home medications unless otherwise directed. a. If you have any pain, it is helpful to get up and walk around, as it is usually from excess gas. b. If this is not helpful, you can take an over-the-counter pain medication.  Choose one of the following that works best for you: i. Naproxen (Aleve, etc)  Two 220mg  tabs twice a day ii. Ibuprofen (Advil, etc) Three 200mg  tabs four times a day (every meal & bedtime) iii. If you still have pain after using one of these, please call the office 4. It is normal to not have a bowel movement for 2-3 days after colonoscopy.    5. ACTIVITIES as tolerated:   6. You may resume regular (light) daily activities beginning the next day--such as daily self-care, walking, climbing stairs--gradually increasing activities as tolerated.    GENERAL SURGERY: POST OP INSTRUCTIONS  7. DIET: Follow a light bland diet the first 24 hours after arrival home, such as soup, liquids, crackers, etc.  Be sure to include lots of fluids daily.  Avoid fast food or heavy meals as your are more likely to get nauseated.   8. Take your usually prescribed home medications unless otherwise directed. 9. PAIN CONTROL: a. Pain is best controlled by a usual combination of three different methods TOGETHER: i. Ice/Heat ii. Over the counter pain medication iii. Prescription pain medication b. Most patients will experience some swelling and bruising around the incisions.  Ice packs or  heating pads (30-60 minutes up to 6 times a day) will help. Use ice for the first few days to help decrease swelling and bruising, then switch to heat to help relax tight/sore spots and speed recovery.  Some people prefer to use ice alone, heat alone, alternating between ice & heat.  Experiment to what works for you.  Swelling and bruising can take several weeks to resolve.   c. It is helpful to take an over-the-counter pain medication regularly for the first few weeks.  Choose one of the following that works best for you: i. Naproxen (Aleve, etc)  Two 220mg  tabs twice a day ii. Ibuprofen (Advil, etc) Three 200mg  tabs four times a day (every meal & bedtime) d. A  prescription for pain medication (such as Percocet, oxycodone, hydrocodone, etc) should be given to you upon discharge.  Take your pain medication as prescribed.  i. If you are having problems/concerns with the prescription medicine (does not control pain, nausea, vomiting, rash, itching, etc), please call us [phone] to see if we need to switch you to a different pain medicine that will work better for you and/or control your side effect better. ii. If you need a refill on your pain medication, please contact your pharmacy.  They will contact our office to request authorization. Prescriptions will not be filled after 5 pm or on week-ends. 10. Avoid getting constipated.  Between the surgery and the pain medications, it is common to experience some constipation.  Increasing fluid intake and taking a fiber supplement (such as Metamucil, Citrucel, FiberCon, MiraLax, etc) 1-2 times a day regularly will usually help prevent this problem from occurring.  A mild laxative (prune juice, Milk of Magnesia, MiraLax, etc) should be taken according to package directions if there are no bowel movements after 48 hours.   11. Wash / shower every day.  You may shower over the dressings as they are waterproof.  Continue to shower over incision(s) after the  dressing is off. 12. Remove your waterproof bandages 5 days after surgery.  You may leave the incision open to air.  You may have skin tapes (Steri Strips) covering the incision(s).  Leave them on until one week, then remove.  You may replace a dressing/Band-Aid to cover the incision for comfort if you wish.      13. ACTIVITIES as tolerated:   a. You may resume regular (light) daily activities beginning the next day--such as daily self-care, walking, climbing stairs--gradually increasing activities as tolerated.  If you can walk 30 minutes without difficulty, it is safe to try more intense activity such as jogging, treadmill, bicycling, low-impact aerobics, swimming, etc. b. Save the most intensive and strenuous activity for last such as sit-ups, heavy lifting, contact sports, etc  Refrain from any heavy lifting or straining until you are off narcotics for pain control.   c. DO NOT PUSH THROUGH PAIN.  Let pain be your guide: If it hurts to do something, don't do it.  Pain is your body warning you to avoid that activity for another week until the pain goes down. d. You may drive when you are no longer taking prescription pain medication, you can comfortably wear a seatbelt, and you can safely maneuver your car and apply brakes. e. Dennis Bast may have sexual intercourse when it is comfortable.  14. FOLLOW UP in our office a. Please call [facility] at [phone] to set up an appointment to see your surgeon in the office for a follow-up appointment approximately 2-3 weeks after your surgery. b. Make sure that you call for this appointment the day you arrive home to insure a convenient appointment time. 9. IF YOU HAVE DISABILITY OR FAMILY LEAVE FORMS, BRING THEM TO THE OFFICE FOR PROCESSING.  DO NOT GIVE THEM TO YOUR DOCTOR.   WHEN TO CALL us [phone]: 1. Poor pain control 2. Reactions / problems with new medications (rash/itching, nausea, etc)  3. Fever over 101.5 F (38.5 C) 4. Worsening swelling or  bruising 5. Continued bleeding from incision. 6. Increased pain, redness, or drainage from the incision   The clinic staff is available to answer your questions during regular business hours (8:30am-5pm).  Please dont hesitate to call and ask to speak to one of our nurses for clinical concerns.   If you have a medical emergency, go to the nearest emergency room or call 911.  A surgeon from [facility] is always on call at the [facility], [place], [place], [address], [place]  [id number] ? MAIN: [phone] ? TOLL FREE: [phone] ?  FAX [phone] [email or web address]   Post Anesthesia Home Care Instructions  Activity: Get plenty of rest for the remainder of the day. A responsible individual must stay with you for 24 hours following the procedure.  For the next 24 hours, DO NOT: -Drive a car -Paediatric nurse -Drink alcoholic beverages -Take any medication unless instructed by your physician -Make any legal decisions or sign  important papers.  Meals: Start with liquid foods such as gelatin or soup. Progress to regular foods as tolerated. Avoid greasy, spicy, heavy foods. If nausea and/or vomiting occur, drink only clear liquids until the nausea and/or vomiting subsides. Call your physician if vomiting continues.  Special Instructions/Symptoms: Your throat may feel dry or sore from the anesthesia or the breathing tube placed in your throat during surgery. If this causes discomfort, gargle with warm salt water. The discomfort should disappear within 24 hours.  If you had a scopolamine patch placed behind your ear for the management of post- operative nausea and/or vomiting:  1. The medication in the patch is effective for 72 hours, after which it should be removed.  Wrap patch in a tissue and discard in the trash. Wash hands thoroughly with soap and water. 2. You may remove the patch earlier than 72 hours if  you experience unpleasant side effects which may include dry mouth, dizziness or visual disturbances. 3. Avoid touching the patch. Wash your hands with soap and water after contact with the patch.

## 2016-11-19 NOTE — Anesthesia Postprocedure Evaluation (Signed)
Anesthesia Post Note  Patient: Seth Villanueva  Procedure(s) Performed: Procedure(s) (LRB): REMOVAL PORT-A-CATH (N/A) COLONOSCOPY THROUGH STOMA (Left)  Patient location during evaluation: PACU Anesthesia Type: MAC Level of consciousness: awake and alert Pain management: pain level controlled Vital Signs Assessment: post-procedure vital signs reviewed and stable Respiratory status: spontaneous breathing, nonlabored ventilation, respiratory function stable and patient connected to nasal cannula oxygen Cardiovascular status: stable and blood pressure returned to baseline Anesthetic complications: no       Last Vitals:  Vitals:   11/19/16 0915 11/19/16 1020  BP: 121/66 (!) 116/58  Pulse: 63 63  Resp: 19 16  Temp:  36.6 C    Last Pain:  Vitals:   11/19/16 0842  TempSrc:   PainSc: Asleep                 Montez Hageman

## 2016-11-19 NOTE — Op Note (Addendum)
[facility] Patient Name: Seth Villanueva Procedure Date: 11/19/2016 MRN: [id number] Attending MD: [name] , MD Date of Birth: 01-09-1981 CSN: [id number] Age: 36 Admit Type: Outpatient Procedure:                Colonoscopy Indications:              High risk colon cancer surveillance: Personal                            history of colon cancer, Last colonoscopy within                            the past 3 years Providers:                [name], MD, Alfonso Patten, Technician Referring MD:              Medicines:                Propofol per Anesthesia, Monitored Anesthesia Care Complications:            No immediate complications. Estimated Blood Loss:     Estimated blood loss: none. Procedure:                Pre-Anesthesia Assessment:                           - Prior to the procedure, a History and Physical                            was performed, and patient medications and                            allergies were reviewed. The patient's tolerance of                            previous anesthesia was also reviewed. The risks                            and benefits of the procedure and the sedation                            options and risks were discussed with the patient.                            All questions were answered, and informed consent                            was obtained. Prior Anticoagulants: The patient has                            taken no previous anticoagulant or antiplatelet                            agents. ASA Grade Assessment: I - A normal, healthy  patient. After reviewing the risks and benefits,                            the patient was deemed in satisfactory condition to                            undergo the procedure.                           - The risks and benefits of the procedure and the                            sedation options and risks were discussed with the   patient. All questions were answered and informed                            consent was obtained.                           - Prior to the procedure, a History and Physical                            was performed, and patient medications, allergies                            and sensitivities were reviewed. The patient's                            tolerance of previous anesthesia was reviewed.                           - Sedation was administered by an anesthesia                            professional. The sedation level attained was                            moderate.                           After obtaining informed consent, the colonoscope                            was passed under direct vision through the                            patient's ostomy. Throughout the procedure, the                            patient's blood pressure, pulse, and oxygen                            saturations were monitored continuously. The  EC-[id number] ([id number]) scope was introduced through                            the anus and advanced to the the cecum, identified                            by appendiceal orifice and ileocecal valve. The                            colonoscopy was performed with ease. The patient                            tolerated the procedure well. The quality of the                            bowel preparation was excellent. The ileocecal                            valve and the appendiceal orifice were photographed. Scope In: 7:52:16 AM Scope Out: 8:05:35 AM Scope Withdrawal Time: 0 hours 5 minutes 49 seconds  Total Procedure Duration: 0 hours 13 minutes 19 seconds  Findings:      The entire examined colon appeared normal. Impression:               - The entire examined colon is normal.                           - No specimens collected.                           - The entire examined colon is normal. Moderate Sedation:      Moderate (conscious)  sedation was personally administered by an       anesthesia professional. The following parameters were monitored: oxygen       saturation, heart rate, blood pressure, and response to care. Recommendation:           - Discharge patient to home (ambulatory).                           - Advance diet as tolerated indefinitely.                           - Repeat colonoscopy in 3 years for surveillance.                           - Continue present medications.                           - Patient has a contact number available for                            emergencies. The signs and symptoms of potential                            delayed complications were discussed with the  patient. Return to normal activities tomorrow.                            Written discharge instructions were provided to the                            patient. Procedure Code(s):        --- Professional ---                           M6381, Colorectal cancer screening; colonoscopy on                            individual at high risk Diagnosis Code(s):        --- Professional ---                           R71.165, Personal history of other malignant                            neoplasm of large intestine CPT copyright 2016 American Medical Association. All rights reserved. The codes documented in this report are preliminary and upon coder review may  be revised to meet current compliance requirements. [name], MD [name], MD 7/90/3833 10:13:26 AM This report has been signed electronically. Number of Addenda: 0

## 2016-11-19 NOTE — Transfer of Care (Signed)
  Last Vitals:  Vitals:   11/19/16 0600  BP: 111/65  Pulse: 64  Resp: 19  Temp: 36.6 C    Last Pain:  Vitals:   11/19/16 0600  TempSrc: Oral      Patients Stated Pain Goal: 4 (11/19/16 7026)  Immediate Anesthesia Transfer of Care Note  Patient: Seth Villanueva  Procedure(s) Performed: Procedure(s) (LRB): REMOVAL PORT-A-CATH (N/A) COLONOSCOPY THROUGH STOMA (Left)  Patient Location: PACU  Anesthesia Type: MAC  Level of Consciousness: awake, alert  and oriented  Airway & Oxygen Therapy: Patient Spontanous Breathing and Patient connected to face mask oxygen  Post-op Assessment: Report given to PACU RN and Post -op Vital signs reviewed and stable  Post vital signs: Reviewed and stable  Complications: No apparent anesthesia complications

## 2016-11-19 NOTE — Anesthesia Procedure Notes (Signed)
Procedure Name: MAC Date/Time: 11/19/2016 7:45 AM Performed by: Mechele Claude Pre-anesthesia Checklist: Patient identified, Emergency Drugs available, Suction available, Patient being monitored and Timeout performed Oxygen Delivery Method: Simple face mask Placement Confirmation: positive ETCO2 and breath sounds checked- equal and bilateral

## 2016-11-19 NOTE — Progress Notes (Signed)
Asking about possible need for pain prescription for use at home. Just "feels like a bruise" type soreness now.Office called-message left so Dr Marcello Moores can be contacted.Patient and Mother informed.

## 2016-11-19 NOTE — Anesthesia Preprocedure Evaluation (Signed)
Anesthesia Evaluation  Patient identified by MRN, date of birth, ID band Patient awake    Reviewed: Allergy & Precautions, NPO status , Patient's Chart, lab work & pertinent test results  Airway Mallampati: I  TM Distance: >3 FB Neck ROM: Full    Dental no notable dental hx.    Pulmonary neg pulmonary ROS, former smoker,    Pulmonary exam normal breath sounds clear to auscultation       Cardiovascular negative cardio ROS Normal cardiovascular exam Rhythm:Regular Rate:Normal     Neuro/Psych negative neurological ROS  negative psych ROS   GI/Hepatic negative GI ROS, Neg liver ROS,   Endo/Other  negative endocrine ROS  Renal/GU negative Renal ROS  negative genitourinary   Musculoskeletal negative musculoskeletal ROS (+)   Abdominal   Peds negative pediatric ROS (+)  Hematology negative hematology ROS (+)   Anesthesia Other Findings   Reproductive/Obstetrics negative OB ROS                             Anesthesia Physical Anesthesia Plan  ASA: II  Anesthesia Plan: MAC   Post-op Pain Management:    Induction: Intravenous  Airway Management Planned: Simple Face Mask  Additional Equipment:   Intra-op Plan:   Post-operative Plan: Extubation in OR  Informed Consent: I have reviewed the patients History and Physical, chart, labs and discussed the procedure including the risks, benefits and alternatives for the proposed anesthesia with the patient or authorized representative who has indicated his/her understanding and acceptance.   Dental advisory given  Plan Discussed with: CRNA  Anesthesia Plan Comments:        Anesthesia Quick Evaluation

## 2016-11-19 NOTE — Op Note (Signed)
11/19/2016  8:33 AM  PATIENT:  Seth Villanueva  36 y.o. male  Patient Care Team: Leonides Sake, MD as PCP - General (Family Medicine) Kyung Rudd, MD as Consulting Physician (Radiation Oncology) Ladell Pier, MD as Consulting Physician (Oncology) [name], MD as Consulting Physician (General Surgery)  PRE-OPERATIVE DIAGNOSIS:  rectal cancer  POST-OPERATIVE DIAGNOSIS:  rectal cancer  PROCEDURE:   REMOVAL PORT-A-CATH COLONOSCOPY THROUGH STOMA    Surgeon(s): [name], MD  ASSISTANT: none   ANESTHESIA:   MAC  EBL:  Total I/O In: 700 [I.V.:700] Out: -   DRAINS: none   SPECIMEN:  No Specimen  DISPOSITION OF SPECIMEN:  N/A  COUNTS:  YES  PLAN OF CARE: Discharge to home after PACU  PATIENT DISPOSITION:  PACU - hemodynamically stable.  INDICATION: 36 y.o. 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  Here for surveillance colonoscopy and port removal.   OR FINDINGS: normal colonoscopy.  Port intact  DESCRIPTION: the patient was identified in the preoperative holding area and taken to the OR where they were laid supine on the operating room table.  MAC anesthesia was induced without difficulty. SCDs were also noted to be in place prior to the initiation of anesthesia.    A surgical timeout was performed indicating the correct patient, procedure, positioning and need for preoperative antibiotics.  I began with a colonoscopy. This will be dictated in a separate report. The colonoscopy was clear.  The patient was then prepped and draped in the usual sterile fashion.  Surgical timeout confirmed our plan. Estimated incision over the previous port site incision using a 15 blade scalpel. This was carried down through subcutaneous tissues until the port was identified. The capsule was incised and the port was removed. The 2 stay sutures were cut. A 3-0 Vicryl pursestring suture was placed over the catheter opening site and the port was removed intact. The  pursestring was tied tightly around this. The capsule was then closed using interrupted 3-0 Vicryl sutures. The skin was closed with a running 4-0 subcuticular Vicryl suture. Skin glue was then applied. The patient was awakened from anesthesia and sent to the post anesthesia care unit in stable condition. All counts were correct per operating room staff.

## 2016-11-19 NOTE — H&P (Signed)
Seth Villanueva is an 36 y.o. male.   Chief Complaint: port in place HPI: 36 y.o. M with a h/o rectal cancer.  S/p resection and eventual end colostomy and adjuvant chemotherapy. Here for surveillance colonoscopy and port removal.  Past Medical History:  Diagnosis Date  . Anemia in neoplastic disease   . Chemotherapy-induced neuropathy ([place])   . Chronic diarrhea    NEGATIVE C-DIFF 11-22-2015  . Chronic nausea   . History of cancer chemotherapy    completed 09-03-2014  . History of phlebitis    08-20-2014---- RIGHT UPPER INNER ARM--  RESOLVED   . Hypoalbuminemia due to protein-calorie malnutrition ([place])   . Incomplete right bundle branch block   . Iron deficiency   . Numbness    feet bilat -- due to chemotherapy induced neuropathy; totally resolved per pt 11/2016  . Presence of colostomy ([place]) 02/04/2016  . Rectal cancer [facility]) onocologist-  dr Benay Spice--  per lov note clinical remission   dx 01-31-2015--  Stage III (ypT2, ypN2) invasive--  s/p  radiation (ended 03-28-2014) and chemo (complete 09-03-2014) and low anterior colon resection with ileostomy 05-18-2014/  after ileostomy 2016 , 04/ 2017 dehisscence rectal anastomosis, 06/ 2017 surgical permanent colostomy  . S/P radiation therapy 02/19/14-03/28/14   rectal  50.4Gy  . Seasonal allergies   . Wears glasses     Past Surgical History:  Procedure Laterality Date  . ANAL FISTULOTOMY N/A 12/26/2015   Procedure: ANAL EUA, FISTULOTOMY;  Surgeon: [name], MD;  Location: [facility];  Service: General;  Laterality: N/A;  . COLONOSCOPY W/ BIOPSIES  01/22/2014   fungating nonobstructing large mass distal rectum  . COLOSTOMY N/A 02/04/2016   Procedure: TAKEDOWN OF SURGICAL ANASTAMOSIS WITH COLOSTOMY AND PELVIC WASHOUT WITH LEFT URETERAL REPAIR;  Surgeon: [name], MD;  Location: [facility];  Service: General;  Laterality: N/A;  . CYSTOSCOPY W/ URETERAL STENT PLACEMENT Left 02/04/2016   Procedure: CYSTOSCOPY WITH LEFT  RETROGRADE PYELOGRAM/URETERAL LEFT STENT PLACEMENT;  Surgeon: Ardis Hughs, MD;  Location: [facility];  Service: Urology;  Laterality: Left;  . EUS N/A 01/30/2014   Procedure: LOWER ENDOSCOPIC ULTRASOUND (EUS);  Surgeon: Arta Silence, MD;  Location: [facility];  Service: Endoscopy;  Laterality: N/A;  with biospy's  . FLEXIBLE SIGMOIDOSCOPY N/A 11/15/2014   Procedure: FLEXIBLE SIGMOIDOSCOPY;  Surgeon: [name], MD;  Location: [facility];  Service: General;  Laterality: N/A;  . ILEOSTOMY CLOSURE N/A 07/12/2015   Procedure: ILEOSTOMY REVERSAL;  Surgeon: [name], MD;  Location: [facility];  Service: General;  Laterality: N/A;  . LAPAROSCOPY N/A 07/12/2015   Procedure: LAPAROSCOPY DIAGNOSTIC;  Surgeon: [name], MD;  Location: [facility];  Service: General;  Laterality: N/A;  . PORTACATH PLACEMENT Left 05/31/2014   Procedure: INSERTION PORT-A-CATH LET SUBCLAVIAN;  Surgeon: [name], MD;  Location: [facility];  Service: General;  Laterality: Left;  . RECTAL EXAM UNDER ANESTHESIA N/A 11/15/2014   Procedure: ANAL EXAM UNDER ANESTHESIA, RIGID PROTOSCOPY;  Surgeon: [name], MD;  Location: [place];  Service: General;  Laterality: N/A;  . REPAIR OF RECTAL PROLAPSE N/A 04/18/2015   Procedure: RETROGRADE COLONOSCOPY AND DILATION OF ANAL STRICTURE;  Surgeon: [name], MD;  Location: [facility];  Service: General;  Laterality: N/A;  . ROBOT ASSISTED LOW ANTERIOR COLON RESECTION WITH FLEXURE MOBILIZATION /  HAND SEWN COLO-ANAL ANASTOMIOSIS/  DIVERTING LOOP ILEOSTOMY  05-18-2014  . WISDOM TOOTH EXTRACTION  age 78    Family History  Problem Relation  Age of Onset  . Breast cancer Maternal Aunt     pat mat great aunt; currently 74  . Leukemia Maternal Aunt     pat mat great aunt; deceased 42s  . Other Paternal Aunt     colon polyps (type/#?)   Social History:  reports that he quit smoking about 9 years ago. His smoking use included Cigarettes. He has a 1.25  pack-year smoking history. He quit smokeless tobacco use about 10 years ago. His smokeless tobacco use included Snuff and Chew. He reports that he drinks about 0.6 oz of alcohol per week . He reports that he does not use drugs.  Allergies:  Allergies  Allergen Reactions  . Zithromax [Azithromycin] Hives  . Compazine [Prochlorperazine] Anxiety    Medications Prior to Admission  Medication Sig Dispense Refill  . acetaminophen (TYLENOL) 325 MG tablet Take 325 mg by mouth once as needed for mild pain (pain). Reported on 07/29/2015    . cetirizine (ZYRTEC) 10 MG tablet Take 10 mg by mouth daily as needed for allergies.    . simethicone (MYLICON) 80 MG chewable tablet Chew 80 mg by mouth every 6 (six) hours as needed for flatulence.      Results for orders placed or performed during the hospital encounter of 11/19/16 (from the past 48 hour(s))  Hemoglobin-hemacue, POC     Status: None   Collection Time: 11/19/16  6:41 AM  Result Value Ref Range   Hemoglobin 16.7 13.0 - 17.0 g/dL   No results found.  Review of Systems  Constitutional: Negative for chills and fever.  Respiratory: Negative for cough and shortness of breath.   Cardiovascular: Negative for chest pain and palpitations.  Gastrointestinal: Negative for nausea and vomiting.  Genitourinary: Negative for dysuria, frequency and urgency.  Skin: Negative for itching and rash.  Neurological: Negative for dizziness and headaches.    Blood pressure 111/65, pulse 64, temperature 97.8 F (36.6 C), temperature source Oral, resp. rate 19, height 6\' 2"  (1.88 m), weight 78.5 kg (173 lb), SpO2 99 %. Physical Exam  Constitutional: He is oriented to person, place, and time. He appears well-developed and well-nourished.  HENT:  Head: Normocephalic and atraumatic.  Eyes: Conjunctivae and EOM are normal. Pupils are equal, round, and reactive to light.  Neck: Normal range of motion. Neck supple.  Cardiovascular: Normal rate and regular rhythm.    Respiratory: Effort normal and breath sounds normal. No respiratory distress.  GI: Soft. He exhibits no distension.  Musculoskeletal: Normal range of motion.  Neurological: He is alert and oriented to person, place, and time.     Assessment/Plan 36 y.o. M in need of surveillance colonoscopy and port removal.  Risks of bleeding, missed pathology and perforation explained to patient.  He agrees to proceed.  [name]., MD 1/56/1537, 7:36 AM

## 2016-11-20 ENCOUNTER — Encounter (HOSPITAL_BASED_OUTPATIENT_CLINIC_OR_DEPARTMENT_OTHER): Payer: Self-pay | Admitting: General Surgery

## 2017-03-20 ENCOUNTER — Telehealth: Payer: Self-pay

## 2017-03-20 NOTE — Telephone Encounter (Signed)
Called and left a message with new appt due to lisa out of office 9/13

## 2017-04-21 ENCOUNTER — Ambulatory Visit (HOSPITAL_BASED_OUTPATIENT_CLINIC_OR_DEPARTMENT_OTHER): Payer: 59 | Admitting: Nurse Practitioner

## 2017-04-21 ENCOUNTER — Other Ambulatory Visit (HOSPITAL_BASED_OUTPATIENT_CLINIC_OR_DEPARTMENT_OTHER): Payer: 59

## 2017-04-21 ENCOUNTER — Encounter (INDEPENDENT_AMBULATORY_CARE_PROVIDER_SITE_OTHER): Payer: Self-pay

## 2017-04-21 VITALS — BP 124/67 | HR 69 | Temp 98.9°F | Resp 18 | Ht 74.0 in | Wt 185.1 lb

## 2017-04-21 DIAGNOSIS — C20 Malignant neoplasm of rectum: Secondary | ICD-10-CM

## 2017-04-21 DIAGNOSIS — Z85048 Personal history of other malignant neoplasm of rectum, rectosigmoid junction, and anus: Secondary | ICD-10-CM

## 2017-04-21 NOTE — Progress Notes (Addendum)
  [place] OFFICE PROGRESS NOTE   Diagnosis:  Rectal cancer  INTERVAL HISTORY:   Seth Villanueva returns as scheduled. He feels well. He has a good appetite and good energy level. He is irrigating the colostomy.  Objective:  Vital signs in last 24 hours:  Blood pressure 124/67, pulse 69, temperature 98.9 F (37.2 C), temperature source Oral, resp. rate 18, height _0  (1.88 m), weight 185 lb 1.6 oz (84 kg).    HEENT: Neck without mass. Lymphatics: No palpable cervical, supra clavicular, axillary or inguinal lymph nodes. Resp: Lungs clear bilaterally. Cardio: Regular rate and rhythm. GI: Abdomen soft and nontender. No hepatosplenomegaly. No mass. Left lower quadrant colostomy. Vascular: No leg edema.    Lab Results:  Lab Results  Component Value Date   WBC 9.1 03/28/2016   HGB 16.7 11/19/2016   HCT 35.0 (L) 03/28/2016   MCV 79.7 03/28/2016   PLT 234 03/28/2016   NEUTROABS 8.2 (H) 03/28/2016    Imaging:  No results found.  Medications: I have reviewed the patient's current medications.  Assessment/Plan: 1. 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 Completion of radiation and concurrent Xeloda 03/28/2014.  Low anterior resection, diverting loop ileostomy, and coloanal anastomosis 05/18/2014,ypT2,ypN2, no loss of mismatch repair protein expression  Cycle 1 adjuvant FOLFOX 06/11/2014  Cycle 2 adjuvant FOLFOX 06/25/2014  Cycle 3 adjuvant FOLFOX 07/09/2014  Cycle 4 adjuvant FOLFOX 07/23/2014  Cycle 5 adjuvant FOLFOX 08/06/2014  Cycle  6 adjuvant FOLFOX 08/20/2014  Cycle 7 adjuvant FOLFOX 09/03/2014  Cycle 8 adjuvant FOLFOX 09/17/2014-oxaliplatin held  Cycle 9 adjuvant FOLFOX 10/01/2014-oxaliplatin held  CT scans chest/abdomen/pelvis 01/31/2015 with no evidence of recurrence. No liver metastatic disease or pathologic adenopathy identified.  CT abdomen/pelvis 11/22/2015-no evidence of recurrent rectal cancer  CT abdomen/pelvis 03/28/2016-partial resolution of presacral inflammation, stable small left-sided retroperitoneal lymph nodes  CTs 10/30/2016-no evidence of locally recurrent or metastatic disease in the chest, abdomen or pelvis.  Colonoscopy through stoma 11/19/2016-normal colonoscopy 2. History of rectal pain secondary to #1.Resolved 3. Phlebitis right upper inner arm 08/20/2014. Resolved. 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 6. Ileostomy takedown procedure 07/12/2015. 7. Admission with a fever 11/22/2015-CT with evidence of dehiscence at the rectal anastomosis  Takedown of the surgical anastomosis with permanent colostomy and pelvic washout 02/04/2016  8. Diarrhea-stool negative for C. difficile 11/22/2015 9. Port-A-Cath removal 11/19/2016    Disposition: [name] appears well. He remains in clinical remission from rectal cancer. We will follow-up on the CEA from today. The plan is to obtain surveillance CTs in 6 months. He will return for a follow-up visit a few days after the scan to review the results. He will contact the office in the interim with any problems.    Ned Card ANP/GNP-BC   04/21/2017  3:53 PM

## 2017-04-21 NOTE — Addendum Note (Signed)
Addended by: Owens Shark on: 04/21/2017 04:24 PM   Modules accepted: Orders

## 2017-04-22 ENCOUNTER — Other Ambulatory Visit: Payer: 59

## 2017-04-22 ENCOUNTER — Telehealth: Payer: Self-pay | Admitting: Nurse Practitioner

## 2017-04-22 ENCOUNTER — Ambulatory Visit: Payer: 59 | Admitting: Nurse Practitioner

## 2017-04-22 LAB — CEA (IN HOUSE-CHCC): CEA (CHCC-In House): 1.45 ng/mL (ref 0.00–5.00)

## 2017-04-22 NOTE — Telephone Encounter (Signed)
Scheduled appt per 9/12 los - patient is aware and reminder letter mailed

## 2017-04-23 ENCOUNTER — Telehealth: Payer: Self-pay | Admitting: *Deleted

## 2017-04-23 NOTE — Telephone Encounter (Signed)
-----   Message from Ladell Pier, MD sent at 04/22/2017  4:19 PM EDT ----- Please call patient, cea is normal

## 2017-08-16 ENCOUNTER — Telehealth: Payer: Self-pay

## 2017-08-16 NOTE — Telephone Encounter (Addendum)
Patient called requesting that appointment be moved sooner due to him moving and work schedule issues. Message to schedulers.  Called and Left voicemail for patient to look out for call from scheduling for appointment on week of 1/21.

## 2017-08-16 NOTE — Telephone Encounter (Signed)
Okay schedule appointment with Benay Spice or Lattie Haw week of 08/30/2017

## 2017-08-18 ENCOUNTER — Telehealth: Payer: Self-pay | Admitting: Oncology

## 2017-08-18 NOTE — Telephone Encounter (Signed)
Scheduled appts per 1/7 sch msg - left voicemail for patient regarding appts.

## 2017-09-02 ENCOUNTER — Other Ambulatory Visit: Payer: 59

## 2017-09-02 ENCOUNTER — Ambulatory Visit: Payer: 59 | Admitting: Nurse Practitioner

## 2017-10-06 ENCOUNTER — Telehealth: Payer: Self-pay

## 2017-10-06 NOTE — Telephone Encounter (Signed)
LVM to inform of appt for tomorrow

## 2017-10-07 ENCOUNTER — Inpatient Hospital Stay: Payer: BLUE CROSS/BLUE SHIELD | Attending: Nurse Practitioner | Admitting: Nurse Practitioner

## 2017-10-07 ENCOUNTER — Telehealth: Payer: Self-pay | Admitting: Emergency Medicine

## 2017-10-07 ENCOUNTER — Inpatient Hospital Stay: Payer: BLUE CROSS/BLUE SHIELD

## 2017-10-07 ENCOUNTER — Telehealth: Payer: Self-pay

## 2017-10-07 ENCOUNTER — Encounter: Payer: Self-pay | Admitting: Nurse Practitioner

## 2017-10-07 VITALS — BP 133/77 | HR 76 | Temp 98.0°F | Resp 18 | Ht 74.0 in | Wt 192.0 lb

## 2017-10-07 DIAGNOSIS — C20 Malignant neoplasm of rectum: Secondary | ICD-10-CM

## 2017-10-07 DIAGNOSIS — Z85048 Personal history of other malignant neoplasm of rectum, rectosigmoid junction, and anus: Secondary | ICD-10-CM | POA: Insufficient documentation

## 2017-10-07 DIAGNOSIS — Z933 Colostomy status: Secondary | ICD-10-CM | POA: Insufficient documentation

## 2017-10-07 LAB — CMP (CANCER CENTER ONLY)
ALBUMIN: 4 g/dL (ref 3.5–5.0)
ALT: 18 U/L (ref 0–55)
ANION GAP: 8 (ref 3–11)
AST: 14 U/L (ref 5–34)
Alkaline Phosphatase: 138 U/L (ref 40–150)
BUN: 13 mg/dL (ref 7–26)
CHLORIDE: 106 mmol/L (ref 98–109)
CO2: 26 mmol/L (ref 22–29)
Calcium: 9.5 mg/dL (ref 8.4–10.4)
Creatinine: 1.02 mg/dL (ref 0.70–1.30)
GFR, Est AFR Am: >60 mL/min (ref >60)
GFR, Estimated: >60 mL/min (ref >60)
GLUCOSE: 78 mg/dL (ref 70–140)
POTASSIUM: 4.4 mmol/L (ref 3.5–5.1)
SODIUM: 140 mmol/L (ref 136–145)
TOTAL PROTEIN: 7.4 g/dL (ref 6.4–8.3)
Total Bilirubin: 0.6 mg/dL (ref 0.2–1.2)

## 2017-10-07 LAB — CEA (IN HOUSE-CHCC): CEA (CHCC-IN HOUSE): 1.59 ng/mL (ref 0.00–5.00)

## 2017-10-07 NOTE — Telephone Encounter (Addendum)
Pt verbalized understanding of this note. Pt is to call back with his work schedule so that CT scans can be scheduled.   ----- Message from Owens Shark, NP sent at 10/07/2017  1:59 PM EST ----- Please let him know the CEA is normal.

## 2017-10-07 NOTE — Telephone Encounter (Signed)
Printed avs and calender of upcoming appointment.per 2/28 los, also add on lab.

## 2017-10-07 NOTE — Progress Notes (Addendum)
[facility] OFFICE PROGRESS NOTE   Diagnosis: Rectal cancer  INTERVAL HISTORY:   Mr. Heemstra returns as scheduled.  He feels well.  He is irrigating the colostomy every morning.  No nausea or vomiting.  No pain.  No shortness of breath.  He has a good appetite.  He is gaining weight.  He reports that he may be relocating to [place] in the near future.  Objective:  Vital signs in last 24 hours:  Blood pressure 133/77, pulse 76, temperature 98 F (36.7 C), temperature source Oral, resp. rate 18, height '6\' 2"'$  (1.88 m), weight 192 lb (87.1 kg), SpO2 100 %.    HEENT: Neck without mass. Lymphatics: No palpable cervical, supraclavicular, axillary or inguinal lymph nodes. Resp: Lungs clear bilaterally. Cardio: Regular rate and rhythm. GI: Abdomen soft and nontender.  No hepatomegaly.  Left lower quadrant colostomy. Vascular: No leg edema.    Lab Results:  Lab Results  Component Value Date   WBC 9.1 03/28/2016   HGB 16.7 11/19/2016   HCT 35.0 (L) 03/28/2016   MCV 79.7 03/28/2016   PLT 234 03/28/2016   NEUTROABS 8.2 (H) 03/28/2016    Imaging:  No results found.  Medications: I have reviewed the patient's current medications.  Assessment/Plan: 1. 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 Completion of radiation and concurrent Xeloda 03/28/2014.  Low anterior resection, diverting loop ileostomy, and coloanal anastomosis 05/18/2014,ypT2,ypN2, no loss of mismatch repair protein expression  Cycle 1 adjuvant FOLFOX 06/11/2014  Cycle 2  adjuvant FOLFOX 06/25/2014  Cycle 3 adjuvant FOLFOX 07/09/2014  Cycle 4 adjuvant FOLFOX 07/23/2014  Cycle 5 adjuvant FOLFOX 08/06/2014  Cycle 6 adjuvant FOLFOX 08/20/2014  Cycle 7 adjuvant FOLFOX 09/03/2014  Cycle 8 adjuvant FOLFOX 09/17/2014-oxaliplatin held  Cycle 9 adjuvant FOLFOX 10/01/2014-oxaliplatin held  CT scans chest/abdomen/pelvis 01/31/2015 with no evidence of recurrence. No liver metastatic disease or pathologic adenopathy identified.  CT abdomen/pelvis 11/22/2015-no evidence of recurrent rectal cancer  CT abdomen/pelvis 03/28/2016-partial resolution of presacral inflammation, stable small left-sided retroperitoneal lymph nodes  CTs 10/30/2016-no evidence of locally recurrent or metastatic disease in the chest, abdomen or pelvis.  Colonoscopy through stoma 11/19/2016-normal colonoscopy 2. History of rectal pain secondary to #1.Resolved 3. Phlebitis right upper inner arm 08/20/2014. Resolved. 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 6. Ileostomy takedown procedure 07/12/2015. 7. Admission with a fever 11/22/2015-CT with evidence of dehiscence at the rectal anastomosis  Takedown of the surgical anastomosis with permanent colostomy and pelvic washout 02/04/2016  8. Diarrhea-stool negative for C. difficile 11/22/2015 9. Port-A-Cath removal 11/19/2016     Disposition: Mr. Berhane appears well.  He remains in clinical remission from rectal cancer.  He will return to the lab today for a chemistry panel and CEA.  He will undergo surveillance CTs in the next 2-3 weeks.  We scheduled a return visit and CEA in 6 months.  He will contact us if he relocates to [place] prior to that visit and we will assist with establishing oncology follow-up.  Patient seen with Dr. Benay Spice.    Ned Card ANP/GNP-BC   10/07/2017  11:22 AM This was a shared  visit with Ned Card.  Mr. Callender is in clinical  remission from rectal cancer.  He will return for an office visit and CEA in 6 months.  Julieanne Manson, MD

## 2017-10-21 ENCOUNTER — Other Ambulatory Visit: Payer: 59

## 2017-10-21 ENCOUNTER — Ambulatory Visit: Payer: 59 | Admitting: Oncology

## 2017-10-21 ENCOUNTER — Other Ambulatory Visit: Payer: Self-pay | Admitting: Emergency Medicine

## 2017-10-21 DIAGNOSIS — C20 Malignant neoplasm of rectum: Secondary | ICD-10-CM

## 2017-10-26 ENCOUNTER — Ambulatory Visit: Payer: 59 | Admitting: Oncology

## 2017-10-26 ENCOUNTER — Other Ambulatory Visit: Payer: 59

## 2017-12-03 ENCOUNTER — Inpatient Hospital Stay: Payer: BLUE CROSS/BLUE SHIELD | Attending: Nurse Practitioner

## 2017-12-03 ENCOUNTER — Ambulatory Visit (HOSPITAL_COMMUNITY)
Admission: RE | Admit: 2017-12-03 | Discharge: 2017-12-03 | Disposition: A | Discharge status: Home / Self Care | Payer: BLUE CROSS/BLUE SHIELD | Source: Ambulatory Visit | Attending: Nurse Practitioner | Admitting: Nurse Practitioner

## 2017-12-03 ENCOUNTER — Encounter (HOSPITAL_COMMUNITY): Payer: Self-pay

## 2017-12-03 DIAGNOSIS — Z85048 Personal history of other malignant neoplasm of rectum, rectosigmoid junction, and anus: Secondary | ICD-10-CM | POA: Insufficient documentation

## 2017-12-03 DIAGNOSIS — C20 Malignant neoplasm of rectum: Secondary | ICD-10-CM | POA: Insufficient documentation

## 2017-12-03 LAB — CMP (CANCER CENTER ONLY)
ALK PHOS: 128 U/L (ref 40–150)
ALT: 20 U/L (ref 0–55)
ANION GAP: 7 (ref 3–11)
AST: 17 U/L (ref 5–34)
Albumin: 4.2 g/dL (ref 3.5–5.0)
BUN: 18 mg/dL (ref 7–26)
CALCIUM: 9.4 mg/dL (ref 8.4–10.4)
CO2: 25 mmol/L (ref 22–29)
Chloride: 108 mmol/L (ref 98–109)
Creatinine: 1.07 mg/dL (ref 0.70–1.30)
GFR, Estimated: >60 mL/min (ref >60)
Glucose, Bld: 92 mg/dL (ref 70–140)
Potassium: 4.4 mmol/L (ref 3.5–5.1)
SODIUM: 140 mmol/L (ref 136–145)
Total Bilirubin: 0.6 mg/dL (ref 0.2–1.2)
Total Protein: 7.2 g/dL (ref 6.4–8.3)

## 2017-12-03 MED ORDER — IOHEXOL 300 MG/ML  SOLN
100.0000 mL | Freq: Once | INTRAMUSCULAR | Status: AC | PRN
  Administered 2017-12-03: 100 mL via INTRAVENOUS

## 2018-03-29 ENCOUNTER — Telehealth: Payer: Self-pay | Admitting: Oncology

## 2018-03-29 NOTE — Telephone Encounter (Signed)
Patient called to reschedule and MD said ok he has moved

## 2018-04-07 ENCOUNTER — Other Ambulatory Visit: Payer: BLUE CROSS/BLUE SHIELD

## 2018-04-07 ENCOUNTER — Inpatient Hospital Stay: Payer: BLUE CROSS/BLUE SHIELD | Admitting: Oncology

## 2018-04-08 ENCOUNTER — Telehealth: Payer: Self-pay

## 2018-04-08 ENCOUNTER — Inpatient Hospital Stay: Payer: BLUE CROSS/BLUE SHIELD | Attending: Oncology

## 2018-04-08 ENCOUNTER — Inpatient Hospital Stay (HOSPITAL_BASED_OUTPATIENT_CLINIC_OR_DEPARTMENT_OTHER): Payer: BLUE CROSS/BLUE SHIELD | Admitting: Oncology

## 2018-04-08 VITALS — BP 127/80 | HR 65 | Temp 98.2°F | Resp 18 | Ht 74.0 in | Wt 187.0 lb

## 2018-04-08 DIAGNOSIS — Z933 Colostomy status: Secondary | ICD-10-CM | POA: Insufficient documentation

## 2018-04-08 DIAGNOSIS — C20 Malignant neoplasm of rectum: Secondary | ICD-10-CM

## 2018-04-08 DIAGNOSIS — Z85048 Personal history of other malignant neoplasm of rectum, rectosigmoid junction, and anus: Secondary | ICD-10-CM

## 2018-04-08 LAB — COMPREHENSIVE METABOLIC PANEL
ALBUMIN: 4 g/dL (ref 3.5–5.0)
ALK PHOS: 109 U/L (ref 38–126)
ALT: 22 U/L (ref 0–44)
ANION GAP: 9 (ref 5–15)
AST: 15 U/L (ref 15–41)
BUN: 16 mg/dL (ref 6–20)
CALCIUM: 9 mg/dL (ref 8.9–10.3)
CHLORIDE: 108 mmol/L (ref 98–111)
CO2: 24 mmol/L (ref 22–32)
Creatinine, Ser: 1.4 mg/dL — ABNORMAL HIGH (ref 0.61–1.24)
GFR calc non Af Amer: >60 mL/min (ref >60)
GLUCOSE: 96 mg/dL (ref 70–99)
Potassium: 4.1 mmol/L (ref 3.5–5.1)
SODIUM: 141 mmol/L (ref 135–145)
Total Bilirubin: 0.6 mg/dL (ref 0.3–1.2)
Total Protein: 7 g/dL (ref 6.5–8.1)

## 2018-04-08 LAB — CEA (IN HOUSE-CHCC): CEA (CHCC-In House): 1.57 ng/mL (ref 0.00–5.00)

## 2018-04-08 NOTE — Telephone Encounter (Signed)
Printed avs and calender of upcoming appointment. Per 8/30 los 

## 2018-04-08 NOTE — Progress Notes (Signed)
[facility] OFFICE PROGRESS NOTE   Diagnosis: Rectal cancer  INTERVAL HISTORY:   Seth Villanueva returns as scheduled.  He feels well.  Good appetite.  No fever.  The colostomy is functioning well.  He irrigates the colostomy each morning.  He now lives in [place].  Objective:  Vital signs in last 24 hours:  Blood pressure 127/80, pulse 65, temperature 98.2 F (36.8 C), temperature source Oral, resp. rate 18, height _0  (1.88 m), weight 187 lb (84.8 kg), SpO2 100 %.    HEENT: Neck without mass Lymphatics: No cervical, supraclavicular, axillary, or inguinal nodes Resp: Lungs clear bilaterally Cardio: Regular rate and rhythm GI: No hepatospleno megaly, no mass, left lower quadrant colostomy, nontender Vascular: No leg edema Rectal: No evidence of recurrence at the perineum    Lab Results:  Lab Results  Component Value Date   WBC 9.1 03/28/2016   HGB 16.7 11/19/2016   HCT 35.0 (L) 03/28/2016   MCV 79.7 03/28/2016   PLT 234 03/28/2016   NEUTROABS 8.2 (H) 03/28/2016    CMP  Lab Results  Component Value Date   NA 141 04/08/2018   K 4.1 04/08/2018   CL 108 04/08/2018   CO2 24 04/08/2018   GLUCOSE 96 04/08/2018   BUN 16 04/08/2018   CREATININE 1.40 (H) 04/08/2018   CALCIUM 9.0 04/08/2018   PROT 7.0 04/08/2018   ALBUMIN 4.0 04/08/2018   AST 15 04/08/2018   ALT 22 04/08/2018   ALKPHOS 109 04/08/2018   BILITOT 0.6 04/08/2018   GFRNONAA >60 04/08/2018   GFRAA >60 04/08/2018    Lab Results  Component Value Date   CEA1 1.59 10/07/2017    Medications: I have reviewed the patient's current medications.   Assessment/Plan: 1. Rectal cancer, clinical stage III, distal rectal mass-approximate 2 cm from the anal verge, status post an endoscopic biopsy 01/30/2014 confirming an invasive adenocarcinoma, microsatellite stable  CTs of the chest, abdomen, and pelvis with no evidence of metastatic disease, malignant-appearing perirectal lymph nodes  on the abdomen/pelvis CT 01/29/2014   EUS 01/30/2014 confirmed a uT3,uN2 lesion   Initiation of radiation and concurrent Xeloda 02/19/2014. Completion of radiation and concurrent Xeloda 03/28/2014.  Low anterior resection, diverting loop ileostomy, and coloanal anastomosis 05/18/2014,ypT2,ypN2, no loss of mismatch repair protein expression  Cycle 1 adjuvant FOLFOX 06/11/2014  Cycle 2 adjuvant FOLFOX 06/25/2014  Cycle 3 adjuvant FOLFOX 07/09/2014  Cycle 4 adjuvant FOLFOX 07/23/2014  Cycle 5 adjuvant FOLFOX 08/06/2014  Cycle 6 adjuvant FOLFOX 08/20/2014  Cycle 7 adjuvant FOLFOX 09/03/2014  Cycle 8 adjuvant FOLFOX 09/17/2014-oxaliplatin held  Cycle 9 adjuvant FOLFOX 10/01/2014-oxaliplatin held  CT scans chest/abdomen/pelvis 01/31/2015 with no evidence of recurrence. No liver metastatic disease or pathologic adenopathy identified.  CT abdomen/pelvis 11/22/2015-no evidence of recurrent rectal cancer  CT abdomen/pelvis 03/28/2016-partial resolution of presacral inflammation, stable small left-sided retroperitoneal lymph nodes  CTs03/23/2018-no evidence of locally recurrent or metastatic disease in the chest, abdomen or pelvis.  Colonoscopy through stoma 11/19/2016-normal colonoscopy  CTs 12/03/2017- no evidence of recurrent disease 2. History of rectal pain secondary to #1.Resolved 3. Phlebitis right upper inner arm 08/20/2014. Resolved. 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 6. Ileostomy takedown procedure 07/12/2015. 7. Admission with a fever 11/22/2015-CT with evidence of dehiscence at the rectal anastomosis  Takedown of the surgical anastomosis with permanent colostomy and pelvic washout 02/04/2016  8. Diarrhea-stool negative for C. difficile 11/22/2015 9. Port-A-Cath removal 11/19/2016   Disposition: Seth Villanueva is in  clinical remission from rectal cancer.  We will follow-up on the CEA from  today.  He will continue colonoscopy surveillance with Dr. Marcello Moores.  He will return for an office visit and CEA in late January 2020.  15 minutes were spent with the patient today.  The majority of the time was used for counseling and coordination of care.  Betsy Coder, MD  04/08/2018  2:37 PM

## 2018-04-12 ENCOUNTER — Telehealth: Payer: Self-pay | Admitting: Emergency Medicine

## 2018-04-12 NOTE — Telephone Encounter (Addendum)
Detailed VM left regarding this note.   ----- Message from Owens Shark, NP sent at 04/08/2018  4:46 PM EDT ----- Please let him know the CEA is in normal range.  Creatinine was elevated at 1.4.  Dr. Benay Spice recommends he have the creatinine repeated in [place] next week.

## 2018-04-21 ENCOUNTER — Other Ambulatory Visit: Payer: Self-pay | Admitting: Emergency Medicine

## 2018-04-21 DIAGNOSIS — C20 Malignant neoplasm of rectum: Secondary | ICD-10-CM

## 2018-04-22 ENCOUNTER — Other Ambulatory Visit: Payer: BLUE CROSS/BLUE SHIELD

## 2018-08-16 ENCOUNTER — Telehealth: Payer: Self-pay | Admitting: *Deleted

## 2018-08-16 NOTE — Telephone Encounter (Signed)
Called to report he thinks he may have a hernia right groin. Comes and goes with certain movements. Not painful. Asking to see Dr. Benay Spice 1/9 in pm or 1/10 anytime. Per Dr. Benay Spice: sounds like inguinal hernia. Dr. [name] is most appropriate person to call to manage this. He understands and agrees.

## 2018-08-29 ENCOUNTER — Other Ambulatory Visit: Payer: Self-pay | Admitting: Surgery

## 2018-08-29 DIAGNOSIS — R1032 Left lower quadrant pain: Secondary | ICD-10-CM

## 2018-08-30 ENCOUNTER — Ambulatory Visit
Admission: RE | Admit: 2018-08-30 | Discharge: 2018-08-30 | Disposition: A | Discharge status: Home / Self Care | Payer: BLUE CROSS/BLUE SHIELD | Source: Ambulatory Visit | Attending: Surgery | Admitting: Surgery

## 2018-08-30 DIAGNOSIS — R1032 Left lower quadrant pain: Secondary | ICD-10-CM | POA: Insufficient documentation

## 2018-08-30 MED ORDER — IOPAMIDOL (ISOVUE-300) INJECTION 61%
100.0000 mL | Freq: Once | INTRAVENOUS | Status: AC | PRN
  Administered 2018-08-30: 100 mL via INTRAVENOUS

## 2018-09-02 ENCOUNTER — Inpatient Hospital Stay: Payer: BLUE CROSS/BLUE SHIELD | Attending: Nurse Practitioner | Admitting: Nurse Practitioner

## 2020-01-12 DIAGNOSIS — Z932 Ileostomy status: Secondary | ICD-10-CM | POA: Diagnosis not present

## 2020-03-24 DIAGNOSIS — Z03818 Encounter for observation for suspected exposure to other biological agents ruled out: Secondary | ICD-10-CM | POA: Diagnosis not present

## 2020-03-24 DIAGNOSIS — Z20822 Contact with and (suspected) exposure to covid-19: Secondary | ICD-10-CM | POA: Diagnosis not present

## 2020-04-08 DIAGNOSIS — Z03818 Encounter for observation for suspected exposure to other biological agents ruled out: Secondary | ICD-10-CM | POA: Diagnosis not present

## 2020-04-08 DIAGNOSIS — Z20822 Contact with and (suspected) exposure to covid-19: Secondary | ICD-10-CM | POA: Diagnosis not present

## 2020-04-19 ENCOUNTER — Telehealth: Payer: Self-pay | Admitting: *Deleted

## 2020-04-19 NOTE — Telephone Encounter (Signed)
Patient now living in [place] and does not have an oncologist or even a PCP. His employer now is mandating the COVID vaccine for employees and he is hopeful Dr. Benay Spice would dictate letter that he should not have the vaccine. He is concerned due to rash/malaise he had w/flu vaccine and all the surgeries he has had in past. Informed him that Dr. Benay Spice is strongly encouraging all his patients to have the vaccine, and it is doubtful he would provide the requested exemption, but message will be delivered as requested.

## 2020-04-22 DIAGNOSIS — R300 Dysuria: Secondary | ICD-10-CM | POA: Diagnosis not present

## 2020-04-22 NOTE — Telephone Encounter (Signed)
Informed patient that Dr. Benay Spice will not be willing to provide an exemption for COVID vaccine. He encourages him to take the vaccine.

## 2020-06-11 DIAGNOSIS — Z03818 Encounter for observation for suspected exposure to other biological agents ruled out: Secondary | ICD-10-CM | POA: Diagnosis not present

## 2020-06-11 DIAGNOSIS — Z20822 Contact with and (suspected) exposure to covid-19: Secondary | ICD-10-CM | POA: Diagnosis not present

## 2020-07-05 DIAGNOSIS — Z932 Ileostomy status: Secondary | ICD-10-CM | POA: Diagnosis not present

## 2020-07-09 DIAGNOSIS — Z20822 Contact with and (suspected) exposure to covid-19: Secondary | ICD-10-CM | POA: Diagnosis not present

## 2020-07-09 DIAGNOSIS — Z03818 Encounter for observation for suspected exposure to other biological agents ruled out: Secondary | ICD-10-CM | POA: Diagnosis not present

## 2020-09-03 DIAGNOSIS — Z20822 Contact with and (suspected) exposure to covid-19: Secondary | ICD-10-CM | POA: Diagnosis not present

## 2020-09-03 DIAGNOSIS — Z03818 Encounter for observation for suspected exposure to other biological agents ruled out: Secondary | ICD-10-CM | POA: Diagnosis not present

## 2021-02-03 DIAGNOSIS — Z20822 Contact with and (suspected) exposure to covid-19: Secondary | ICD-10-CM | POA: Diagnosis not present

## 2021-02-03 DIAGNOSIS — Z03818 Encounter for observation for suspected exposure to other biological agents ruled out: Secondary | ICD-10-CM | POA: Diagnosis not present

## 2021-02-03 DIAGNOSIS — Z1159 Encounter for screening for other viral diseases: Secondary | ICD-10-CM | POA: Diagnosis not present

## 2021-08-16 DIAGNOSIS — J019 Acute sinusitis, unspecified: Secondary | ICD-10-CM | POA: Diagnosis not present

## 2021-09-22 DIAGNOSIS — J329 Chronic sinusitis, unspecified: Secondary | ICD-10-CM | POA: Diagnosis not present

## 2021-09-22 DIAGNOSIS — H6981 Other specified disorders of Eustachian tube, right ear: Secondary | ICD-10-CM | POA: Diagnosis not present

## 2021-10-01 DIAGNOSIS — R42 Dizziness and giddiness: Secondary | ICD-10-CM | POA: Diagnosis not present

## 2021-10-01 DIAGNOSIS — Z1211 Encounter for screening for malignant neoplasm of colon: Secondary | ICD-10-CM | POA: Diagnosis not present

## 2021-10-01 DIAGNOSIS — L409 Psoriasis, unspecified: Secondary | ICD-10-CM | POA: Diagnosis not present

## 2021-10-01 DIAGNOSIS — R7989 Other specified abnormal findings of blood chemistry: Secondary | ICD-10-CM | POA: Diagnosis not present

## 2021-10-01 DIAGNOSIS — J309 Allergic rhinitis, unspecified: Secondary | ICD-10-CM | POA: Diagnosis not present

## 2022-02-25 ENCOUNTER — Encounter: Payer: Self-pay | Admitting: General Surgery

## 2023-03-16 ENCOUNTER — Encounter: Payer: Self-pay | Admitting: Oncology

## 2023-03-16 ENCOUNTER — Other Ambulatory Visit: Payer: Self-pay | Admitting: Allergy

## 2023-03-16 ENCOUNTER — Ambulatory Visit: Payer: BC Managed Care – PPO | Admitting: Allergy

## 2023-03-16 ENCOUNTER — Encounter: Payer: Self-pay | Admitting: Allergy

## 2023-03-16 VITALS — BP 128/82 | HR 67 | Temp 98.2°F | Resp 16 | Ht 73.0 in | Wt 194.0 lb

## 2023-03-16 DIAGNOSIS — J4520 Mild intermittent asthma, uncomplicated: Secondary | ICD-10-CM | POA: Diagnosis not present

## 2023-03-16 DIAGNOSIS — J3089 Other allergic rhinitis: Secondary | ICD-10-CM | POA: Diagnosis not present

## 2023-03-16 DIAGNOSIS — H1013 Acute atopic conjunctivitis, bilateral: Secondary | ICD-10-CM

## 2023-03-16 MED ORDER — MONTELUKAST SODIUM 10 MG PO TABS: 10.0000 mg | ORAL_TABLET | Freq: Every day | ORAL | 5 refills | Status: AC

## 2023-03-16 MED ORDER — AIRSUPRA 90-80 MCG/ACT IN AERO: 2.0000 | INHALATION_SPRAY | RESPIRATORY_TRACT | 2 refills | Status: AC | PRN

## 2023-03-16 NOTE — Telephone Encounter (Signed)
Have jeff the rep looking into this for [place]

## 2023-03-16 NOTE — Patient Instructions (Signed)
-   Testing today showed: grasses, weeds, trees, indoor molds, outdoor molds, dust mites, cat, dog, mixed feathers and horse. - Copy of test results provided.  - Avoidance measures provided. - Continue with: Zyrtec (cetirizine) 10mg  tablet once daily.  If Zyrtec becomes less effective then can take either Allegra or Xyzal which are both over-the-counter antihistamines. - Start taking: Singulair (montelukast) 10mg  daily.  This is an antileukotriene that can help with both allergy and asthma symptom control.  If you notice any change in mood/behavior/sleep after starting Singulair then stop this medication and let us know.  Symptoms resolve after stopping the medication.  - You can use an extra dose of the antihistamine, if needed, for breakthrough symptoms.  - Consider nasal saline rinses 1-2 times daily to remove allergens from the nasal cavities as well as help with mucous clearance (this is especially helpful to do before the nasal sprays are given) - Consider allergy shots as a means of long-term control. - Allergy shots "re-train" and "reset" the immune system to ignore environmental allergens and decrease the resulting immune response to those allergens (sneezing, itchy watery eyes, runny nose, nasal congestion, etc).    - Allergy shots improve symptoms in 75-85% of patients.  -You can check with your insurance coverage and if this looks good you can schedule a new start appointment once you determine if you would like to do traditional start versus [facility] to start.    - Lung function testing is normal today! - Rescue medications: Airsupra 2 puffs every 4 hours if needed for wheeze/cough/shortness of breath/chest tightness.  Monitor frequency of use.  - Reactive airway control goals:  * Full participation in all desired activities (may need albuterol before activity) * Albuterol use two time or less a week on average (not counting use with activity) * Cough interfering with sleep two time or less a  month * Oral steroids no more than once a year * No hospitalizations   Follow-up in 4-6 months or sooner if needed

## 2023-03-16 NOTE — Progress Notes (Signed)
New Patient Note  RE: Seth Villanueva MRN: [id number] DOB: 01/05/1981 Date of Office Visit: 03/16/2023  Primary care provider: Patient, No Pcp Per  Chief Complaint: wheeze around cats  History of present illness: Seth Villanueva is a 42 y.o. male presenting today for evaluation of allergic rhinitis.   He reports he wheezes with cat exposure.  His girlfriend has 3 cats.  They will be moving in together in the next month.  He states if he pets the cat and touches his face his face swells and he can have itching.  He states the wheezing doesn't get 'terribly bad' as long as he stays away from the cats.  He did get primatene mist inhaler from pharmacy to use as needed.  He has no previous diagnosis of asthma. He can get itchy/watery eyes, runny/stuffy nose, sneezing as well with cat exposure and in general with allergies. He has taken zyrtec as well as use mometasone nasal spray.  He uses airpurifiers in the home.  He has no history of food allergy.  He does report having eczema as well as psoriasis.  Review of systems: 10pt ROS negative unless noted above in HPI  Past medical history: Past Medical History:  Diagnosis Date   Anemia in neoplastic disease    Chemotherapy-induced neuropathy (HCC)    Chronic diarrhea    NEGATIVE C-DIFF 11-22-2015   Chronic nausea    Eczema    History of cancer chemotherapy    completed 09-03-2014   History of phlebitis    08-20-2014---- RIGHT UPPER INNER ARM--  RESOLVED    Hypoalbuminemia due to protein-calorie malnutrition (HCC)    Incomplete right bundle branch block    Iron deficiency    Numbness    feet bilat -- due to chemotherapy induced neuropathy; totally resolved per pt 11/2016   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/04/2016   Rectal cancer (HCC) onocologist-  dr Truett Perna--  per lov note clinical remission   dx 01-31-2015--  Stage III (ypT2, ypN2) invasive--  s/p  radiation (ended 03-28-2014) and chemo (complete 09-03-2014) and low anterior colon  resection with ileostomy 05-18-2014/  after ileostomy 2016 , 04/ 2017 dehisscence rectal anastomosis, 06/ 2017 surgical permanent colostomy   S/P radiation therapy 02/19/14-03/28/14   rectal  50.4Gy   Seasonal allergies    Wears glasses     Past surgical history: Past Surgical History:  Procedure Laterality Date   ANAL FISTULOTOMY N/A 12/26/2015   Procedure: ANAL EUA, FISTULOTOMY;  Surgeon: Romie Levee, MD;  Location: [facility];  Service: General;  Laterality: N/A;   COLONOSCOPY Left 11/19/2016   Procedure: COLONOSCOPY THROUGH STOMA;  Surgeon: Romie Levee, MD;  Location: [facility];  Service: General;  Laterality: Left;   COLONOSCOPY W/ BIOPSIES  01/22/2014   fungating nonobstructing large mass distal rectum   COLOSTOMY N/A 02/04/2016   Procedure: TAKEDOWN OF SURGICAL ANASTAMOSIS WITH COLOSTOMY AND PELVIC WASHOUT WITH LEFT URETERAL REPAIR;  Surgeon: Romie Levee, MD;  Location: [facility];  Service: General;  Laterality: N/A;   CYSTOSCOPY W/ URETERAL STENT PLACEMENT Left 02/04/2016   Procedure: CYSTOSCOPY WITH LEFT RETROGRADE PYELOGRAM/URETERAL LEFT STENT PLACEMENT;  Surgeon: Crist Fat, MD;  Location: [facility];  Service: Urology;  Laterality: Left;   EUS N/A 01/30/2014   Procedure: LOWER ENDOSCOPIC ULTRASOUND (EUS);  Surgeon: Willis Modena, MD;  Location: [facility];  Service: Endoscopy;  Laterality: N/A;  with biospy's   FLEXIBLE SIGMOIDOSCOPY N/A 11/15/2014   Procedure: FLEXIBLE SIGMOIDOSCOPY;  Surgeon: Romie Levee, MD;  Location: [facility];  Service: General;  Laterality: N/A;   ILEOSTOMY CLOSURE N/A 07/12/2015   Procedure: ILEOSTOMY REVERSAL;  Surgeon: Romie Levee, MD;  Location: [facility];  Service: General;  Laterality: N/A;   LAPAROSCOPY N/A 07/12/2015   Procedure: LAPAROSCOPY DIAGNOSTIC;  Surgeon: Romie Levee, MD;  Location: [facility];  Service: General;  Laterality: N/A;   PORT-A-CATH REMOVAL N/A 11/19/2016   Procedure: REMOVAL  PORT-A-CATH;  Surgeon: Romie Levee, MD;  Location: [facility];  Service: General;  Laterality: N/A;   PORTACATH PLACEMENT Left 05/31/2014   Procedure: INSERTION PORT-A-CATH LET SUBCLAVIAN;  Surgeon: Romie Levee, MD;  Location: [facility];  Service: General;  Laterality: Left;   RECTAL EXAM UNDER ANESTHESIA N/A 11/15/2014   Procedure: ANAL EXAM UNDER ANESTHESIA, RIGID PROTOSCOPY;  Surgeon: Romie Levee, MD;  Location: [facility];  Service: General;  Laterality: N/A;   REPAIR OF RECTAL PROLAPSE N/A 04/18/2015   Procedure: RETROGRADE COLONOSCOPY AND DILATION OF ANAL STRICTURE;  Surgeon: Romie Levee, MD;  Location: [facility];  Service: General;  Laterality: N/A;   ROBOT ASSISTED LOW ANTERIOR COLON RESECTION WITH FLEXURE MOBILIZATION /  HAND SEWN COLO-ANAL ANASTOMIOSIS/  DIVERTING LOOP ILEOSTOMY  05-18-2014   WISDOM TOOTH EXTRACTION  age 77    Family history:  Family History  Problem Relation Age of Onset   Bronchitis Mother    Allergic rhinitis Father    Breast cancer Maternal Aunt        pat mat great aunt; currently 44   Leukemia Maternal Aunt        pat mat great aunt; deceased 41s   Other Paternal Aunt        colon polyps (type/#?)   Asthma Neg Hx    Angioedema Neg Hx    Immunodeficiency Neg Hx    Eczema Neg Hx    Atopy Neg Hx     Social history: Lives in a home with electric heating and central cooling.  No pets in the home (see HPI).  No concern for water damage, mildew or roaches in the home.  He is a IT trainer.   Tobacco Use   Smoking status: Former    Current packs/day: 0.00    Average packs/day: 0.3 packs/day for 5.0 years (1.3 ttl pk-yrs)    Types: Cigarettes    Start date: 08/10/2002    Quit date: 08/11/2007    Years since quitting: 15.6   Smokeless tobacco: Former    Types: Snuff, Chew    Quit date: 02/01/2006     Medication List: Current Outpatient Medications  Medication Sig Dispense Refill   Albuterol-Budesonide  (AIRSUPRA) 90-80 MCG/ACT AERO Inhale 2 puffs into the lungs every 4 (four) hours as needed. 10.7 g 2   cetirizine (ZYRTEC) 10 MG tablet Take 10 mg by mouth daily as needed for allergies.     mometasone (NASONEX) 50 MCG/ACT nasal spray Place 2 sprays into the nose daily.     montelukast (SINGULAIR) 10 MG tablet Take 1 tablet (10 mg total) by mouth at bedtime. 30 tablet 5   No current facility-administered medications for this visit.   Facility-Administered Medications Ordered in Other Visits  Medication Dose Route Frequency Provider Last Rate Last Admin   heparin lock flush 100 unit/mL  500 Units Intravenous Once Thornton Papas B, MD       sodium chloride 0.9 % injection 10 mL  10 mL Intravenous PRN Ladene Artist, MD  Known medication allergies: Allergies  Allergen Reactions   Zithromax [Azithromycin] Hives   Lactose Intolerance (Gi)    Compazine [Prochlorperazine] Anxiety     Physical examination: Blood pressure 128/82, pulse 67, temperature 98.2 F (36.8 C), temperature source Temporal, resp. rate 16, height 6\' 1"  (1.854 m), weight 194 lb (88 kg), SpO2 97%.  General: Alert, interactive, in no acute distress. HEENT: PERRLA, TMs pearly gray, turbinates non-edematous without discharge, post-pharynx non erythematous. Neck: Supple without lymphadenopathy. Lungs: Clear to auscultation without wheezing, rhonchi or rales. {no increased work of breathing. CV: Normal S1, S2 without murmurs. Abdomen: Nondistended, nontender. Skin: Warm and dry, without lesions or rashes. Extremities:  No clubbing, cyanosis or edema. Neuro:   Grossly intact.  Diagnositics/Labs: Spirometry: FEV1: 3.85L 83%, FVC: 5.46L 94%, ratio consistent with nonobstructive pattern  Allergy testing:   Airborne Adult Perc - 03/16/23 0939     Time Antigen Placed 1610    Allergen Manufacturer Waynette Buttery    Location Back    Number of Test 55    1. Control-Buffer 50% Glycerol Negative    2. Control-Histamine 2+     3. Bahia 4+    4. [place] 3+    5. Johnson 4+    6. Kentucky Blue 4+    7. Meadow Fescue 4+    8. Perennial Rye 4+    9. Timothy 4+    10. Ragweed Mix Negative    11. Cocklebur Negative    12. Plantain,  English 2+    13. Baccharis 2+    14. Dog Fennel Negative    15. Russian Thistle Negative    16. Lamb's Quarters 2+    17. Sheep Sorrell Negative    18. Rough Pigweed 2+    19. Marsh Elder, Rough 2+    20. Mugwort, Common 2+    21. Box, Elder 2+    22. Cedar, red 3+    23. Sweet Gum Negative    24. Pecan Pollen Negative    25. Pine Mix Negative    26. Walnut, Black Pollen 2+    27. Red Mulberry Negative    28. Ash Mix 2+    29. Birch Mix 2+    30. Beech American Negative    31. Cottonwood, Guinea-Bissau Negative    32. Hickory, White Negative    33. Maple Mix Negative    34. Oak, [place] Mix 2+    35. Sycamore Eastern Negative    36. Alternaria Alternata 2+    37. Cladosporium Herbarum 2+    38. Aspergillus Mix 2+    39. Penicillium Mix Negative    40. Bipolaris Sorokiniana (Helminthosporium) Negative    41. Drechslera Spicifera (Curvularia) 2+    42. Mucor Plumbeus Negative    43. Fusarium Moniliforme Negative    44. Aureobasidium Pullulans (pullulara) 2+    45. Rhizopus Oryzae 2+    46. Botrytis Cinera 2+    47. Epicoccum Nigrum Negative    48. Phoma Betae Negative    49. Dust Mite Mix Negative    50. Cat Hair 10,000 BAU/ml 3+    51.  Dog Epithelia 4+    52. Mixed Feathers 2+    53. Horse Epithelia 3+    54. Cockroach, German Negative    55. Tobacco Leaf Negative             Allergy testing results were read and interpreted by provider, documented by clinical staff.   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 - Copy of test results provided.  - Avoidance measures  provided. - Continue with: Zyrtec (cetirizine) 10mg  tablet once daily.  If Zyrtec becomes less effective then can take either Allegra or Xyzal which are both over-the-counter antihistamines. - Start taking: Singulair (montelukast) 10mg  daily.  This is an antileukotriene that can help with both allergy and asthma symptom control.  If you notice any change in mood/behavior/sleep after starting Singulair then stop this medication and let us know.  Symptoms resolve after stopping the medication.  - You can use an extra dose of the antihistamine, if needed, for breakthrough symptoms.  - Consider nasal saline rinses 1-2 times daily to remove allergens from the nasal cavities as well as help with mucous clearance (this is especially helpful to do before the nasal sprays are given) - Consider allergy shots as a means of long-term control. - Allergy shots "re-train" and "reset" the immune system to ignore environmental allergens and decrease the resulting immune response to those allergens (sneezing, itchy watery eyes, runny nose, nasal congestion, etc).    - Allergy shots improve symptoms in 75-85% of patients.  -You can check with your insurance coverage and if this looks good you can schedule a new start appointment once you determine if you would like to do traditional start versus [facility] to start.    - Lung function testing is normal today! - Rescue medications: Airsupra 2 puffs every 4 hours if needed for wheeze/cough/shortness of breath/chest tightness.  Monitor frequency of use.  - Reactive airway control goals:  * Full participation in all desired activities (may need albuterol before activity) * Albuterol use two time or less a week on average (not counting use with activity) * Cough interfering with sleep two time or less a month * Oral steroids no more than once a year * No hospitalizations   Follow-up in 4-6 months or sooner if needed  I appreciate the opportunity to take part in Nimrod's  care. Please do not hesitate to contact me with questions.  Sincerely,   Margo Aye, MD Allergy/Immunology [facility]

## 2023-03-19 ENCOUNTER — Telehealth: Payer: Self-pay | Admitting: Allergy

## 2023-03-19 NOTE — Telephone Encounter (Signed)
Patient called the office and states he has called his insurance and verified everything for [facility]. He is all set and would like to do this in our [place] office. I did inform him I will get this message to our [place] office and that we will be in touch with him as soon as we can.

## 2023-03-24 NOTE — Telephone Encounter (Signed)
Patient informed we are looking for dates. He would prefer a Monday or a Friday if possible. If not he will take another day.

## 2023-05-25 NOTE — Telephone Encounter (Signed)
Left a message for patient to call the office in regards to [facility] Immunotherapy.

## 2023-05-26 NOTE — Telephone Encounter (Deleted)
If patient calls back regarding [facility] immunotherapy, we have one available appointment on 06/11/2023 at 8:30 with Dr. Marlynn Perking.

## 2023-07-14 NOTE — Telephone Encounter (Signed)
Left a message for patient to call the office in regards to [facility] Immunotherapy. We have one available date this month 07/30/2023 if he was still interested.

## 2023-07-15 NOTE — Telephone Encounter (Signed)
Patient called stating he was returning a call from [place]. I informed the patient on the reason for the call the patient states he is still interested in doing [facility]. Patient states he will not be available until the first of the year.

## 2023-07-15 NOTE — Telephone Encounter (Signed)
Left a message informing patient to call the office by tomorrow 07/16/2023 to let me know if he was still interested. I stated that if I didn't hear from him by tomorrow 07/16/2023 unfortunately I would have to move on to the next person.

## 2023-07-16 NOTE — Telephone Encounter (Signed)
Spoke with patient, informed him I did receive his message regarding not being able to come in for [facility] immunotherapy until after the first of the year. I did inform him we had 2 available dates for January 3rd and January 6th. Patient would like to hold off for now until he sees how this month goes as he travels for work. Informed patient to contact the office when he was available to start [facility].

## 2023-08-24 ENCOUNTER — Ambulatory Visit: Payer: BC Managed Care – PPO | Admitting: Allergy

## 2023-11-14 DIAGNOSIS — H65192 Other acute nonsuppurative otitis media, left ear: Secondary | ICD-10-CM | POA: Diagnosis not present

## 2023-11-14 DIAGNOSIS — R051 Acute cough: Secondary | ICD-10-CM | POA: Diagnosis not present

## 2023-11-14 DIAGNOSIS — J019 Acute sinusitis, unspecified: Secondary | ICD-10-CM | POA: Diagnosis not present

## 2023-11-21 DIAGNOSIS — H6692 Otitis media, unspecified, left ear: Secondary | ICD-10-CM | POA: Diagnosis not present

## 2023-11-21 DIAGNOSIS — J069 Acute upper respiratory infection, unspecified: Secondary | ICD-10-CM | POA: Diagnosis not present

## 2023-11-21 DIAGNOSIS — J0100 Acute maxillary sinusitis, unspecified: Secondary | ICD-10-CM | POA: Diagnosis not present

## 2024-03-27 DIAGNOSIS — R220 Localized swelling, mass and lump, head: Secondary | ICD-10-CM | POA: Diagnosis not present

## 2024-06-05 DIAGNOSIS — K432 Incisional hernia without obstruction or gangrene: Secondary | ICD-10-CM | POA: Diagnosis not present

## 2024-06-06 ENCOUNTER — Encounter: Payer: Self-pay | Admitting: Oncology

## 2024-06-06 ENCOUNTER — Other Ambulatory Visit: Payer: Self-pay | Admitting: General Surgery

## 2024-06-06 DIAGNOSIS — K432 Incisional hernia without obstruction or gangrene: Secondary | ICD-10-CM

## 2024-06-08 ENCOUNTER — Ambulatory Visit
Admission: RE | Admit: 2024-06-08 | Discharge: 2024-06-08 | Disposition: A | Discharge status: Home / Self Care | Source: Ambulatory Visit | Attending: General Surgery | Admitting: General Surgery

## 2024-06-08 ENCOUNTER — Other Ambulatory Visit

## 2024-06-08 DIAGNOSIS — K432 Incisional hernia without obstruction or gangrene: Secondary | ICD-10-CM

## 2024-06-08 DIAGNOSIS — K439 Ventral hernia without obstruction or gangrene: Secondary | ICD-10-CM | POA: Diagnosis not present

## 2024-06-08 MED ORDER — IOPAMIDOL (ISOVUE-300) INJECTION 61%
100.0000 mL | Freq: Once | INTRAVENOUS | Status: AC | PRN
  Administered 2024-06-08: 100 mL via INTRAVENOUS

## 2024-06-12 ENCOUNTER — Ambulatory Visit: Payer: Self-pay | Admitting: General Surgery

## 2024-06-12 NOTE — H&P (Signed)
 PROVIDER:  BERNARDA WANDA NED, MD   MRN: [id number] DOB: 02-17-1981 DATE OF ENCOUNTER: 06/05/2024   Subjective    Chief Complaint: No chief complaint on file.       History of Present Illness: Seth Villanueva is a 43 y.o. male who is seen today as an office consultation at the request of Dr. Stephanie for evaluation of No chief complaint on file. .  Patient has noticed a bulge in his upper abdomen that has been painful at least once.  It reduces with palpation.  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     Review of Systems: A complete review of systems was obtained from the patient.  I have reviewed this information and discussed as appropriate with the patient.  See HPI as well for other ROS.     Medical History: Past Medical History      Past Medical History:  Diagnosis Date   History of cancer          Problem List     Patient Active Problem List  Diagnosis   Rectal cancer (CMS/HHS-HCC)        Past Surgical History       Past Surgical History:  Procedure Laterality Date   COLON SURGERY            Allergies      Allergies  Allergen Reactions   Azithromycin Hives   Prochlorperazine  Anxiety and Other (See Comments)        Medications Ordered Prior to Encounter        Current Outpatient Medications on File Prior to Visit  Medication Sig Dispense Refill   cetirizine (ZYRTEC) 10 MG tablet Take 10 mg by mouth once daily as needed for Allergies       montelukast  (SINGULAIR ) 10 mg tablet Take 10 mg by mouth at bedtime        No current facility-administered medications on file prior to visit.        Family History  History reviewed. No pertinent family history.      Tobacco Use History  Social History        Tobacco Use  Smoking Status Former   Types: Cigarettes  Smokeless Tobacco Never        Social History  Social History         Socioeconomic History   Marital status: Single  Tobacco Use   Smoking status:  Former      Types: Cigarettes   Smokeless tobacco: Never  Substance and Sexual Activity   Drug use: Never    Social Drivers of Health        Housing Stability: Unknown (06/05/2024)    Housing Stability Vital Sign     Homeless in the Last Year: No        Objective:         Vitals:    06/05/24 1454  BP: (!) 145/89  Pulse: 92  Temp: 36.9 C (98.4 F)  SpO2: 98%  Weight: 92.4 kg (203 lb 9.6 oz)  Height: 188 cm (6' 2)  PainSc: 0-No pain      Exam Gen: NAD Abd: soft 1 cm defect in the fascia at previous port site.     Labs, Imaging and Diagnostic Testing:     Assessment and Plan:  Incisional hernia without obstruction or gangrene  (primary encounter diagnosis)     Given that he has had multiple surgeries and has multiple abdominal incisions, I have  recommended CT scan of the abdomen.  If he does have just the 1 hernia, we will plan on a simple repair with mesh.  If there are more hernias noted, I will have him follow-up with one of the hernia surgeons.   Bernarda JAYSON Ned, MD Colon and Rectal Surgery [facility]

## 2024-06-14 DIAGNOSIS — J3081 Allergic rhinitis due to animal (cat) (dog) hair and dander: Secondary | ICD-10-CM | POA: Diagnosis not present

## 2024-06-14 DIAGNOSIS — H9313 Tinnitus, bilateral: Secondary | ICD-10-CM | POA: Diagnosis not present

## 2024-06-14 DIAGNOSIS — H6121 Impacted cerumen, right ear: Secondary | ICD-10-CM | POA: Diagnosis not present

## 2024-06-28 NOTE — Patient Instructions (Addendum)
 SURGICAL WAITING ROOM VISITATION  Patients having surgery or a procedure may have no more than 2 support people in the waiting area - these visitors may rotate.    Children under the age of 25 must have an adult with them who is not the patient.  Visitors with respiratory illnesses are discouraged from visiting and should remain at home.  If the patient needs to stay at the hospital during part of their recovery, the visitor guidelines for inpatient rooms apply. Pre-op nurse will coordinate an appropriate time for 1 support person to accompany patient in pre-op.  This support person may not rotate.    Please refer to the [facility] website for the visitor guidelines for Inpatients (after your surgery is over and you are in a regular room).       Your procedure is scheduled on:  07/18/2024    Report to [facility] Main Entrance    Report to admitting at   [phone]   Call this number if you have problems the morning of surgery [phone]   Do not eat food :After Midnight.   After Midnight you may have the following liquids until _ 0430_____ AM DAY OF SURGERY  Water Non-Citrus Juices (without pulp, NO RED-Apple, White grape, White cranberry) Black Coffee (NO MILK/CREAM OR CREAMERS, sugar ok)  Clear Tea (NO MILK/CREAM OR CREAMERS, sugar ok) regular and decaf                             Plain Jell-O (NO RED)                                           Fruit ices (not with fruit pulp, NO RED)                                     Popsicles (NO RED)                                                               Sports drinks like Gatorade (NO RED)                      If you have questions, please contact your surgeon's office.      Oral Hygiene is also important to reduce your risk of infection.                                    Remember - BRUSH YOUR TEETH THE MORNING OF SURGERY WITH YOUR REGULAR TOOTHPASTE  DENTURES WILL BE REMOVED PRIOR TO SURGERY PLEASE DO NOT APPLY  Poly grip OR ADHESIVES!!!   Do NOT smoke after Midnight   Stop all vitamins and herbal supplements 7 days before surgery.   Take these medicines the morning of surgery with A SIP OF WATER:  inhalers as usual and bring, nasal spray, xyzal   DO NOT TAKE ANY ORAL DIABETIC MEDICATIONS DAY OF YOUR SURGERY  Bring CPAP mask and tubing day  of surgery.                              You may not have any metal on your body including hair pins, jewelry, and body piercing             Do not wear make-up, lotions, powders, perfumes/cologne, or deodorant  Do not wear nail polish including gel and S&S, artificial/acrylic nails, or any other type of covering on natural nails including finger and toenails. If you have artificial nails, gel coating, etc. that needs to be removed by a nail salon please have this removed prior to surgery or surgery may need to be canceled/ delayed if the surgeon/ anesthesia feels like they are unable to be safely monitored.   Do not shave  48 hours prior to surgery.               Men may shave face and neck.   Do not bring valuables to the hospital. [place] IS NOT             RESPONSIBLE   FOR VALUABLES.   Contacts, glasses, dentures or bridgework may not be worn into surgery.   Bring small overnight bag day of surgery.   DO NOT BRING YOUR HOME MEDICATIONS TO THE HOSPITAL. PHARMACY WILL DISPENSE MEDICATIONS LISTED ON YOUR MEDICATION LIST TO YOU DURING YOUR ADMISSION IN THE HOSPITAL!    Patients discharged on the day of surgery will not be allowed to drive home.  Someone NEEDS to stay with you for the first 24 hours after anesthesia.   Special Instructions: Bring a copy of your healthcare power of attorney and living will documents the day of surgery if you haven't scanned them before.              Please read over the following fact sheets you were given: IF YOU HAVE QUESTIONS ABOUT YOUR PRE-OP INSTRUCTIONS PLEASE CALL [phone].   If you received a COVID test  during your pre-op visit  it is requested that you wear a mask when out in public, stay away from anyone that may not be feeling well and notify your surgeon if you develop symptoms. If you test positive for Covid or have been in contact with anyone that has tested positive in the last 10 days please notify you surgeon.    [facility] [facility] - Preparing for Surgery Before surgery, you can play an important role.  Because skin is not sterile, your skin needs to be as free of germs as possible.  You can reduce the number of germs on your skin by washing with CHG (chlorahexidine gluconate) soap before surgery.  CHG is an antiseptic cleaner which kills germs and bonds with the skin to continue killing germs even after washing. Please DO NOT use if you have an allergy  to CHG or antibacterial soaps.  If your skin becomes reddened/irritated stop using the CHG and inform your nurse when you arrive at Short Stay. Do not shave (including legs and underarms) for at least 48 hours prior to the first CHG shower.  You may shave your face/neck.  Please follow these instructions carefully:  1.  Shower with CHG Soap the night before surgery ONLY (DO NOT USE THE SOAP THE MORNING OF SURGERY).  2.  If you choose to wash your hair, wash your hair first as usual with your normal  shampoo.  3.  After you shampoo, rinse your  hair and body thoroughly to remove the shampoo.                             4.  Use CHG as you would any other liquid soap.  You can apply chg directly to the skin and wash.  Gently with a scrungie or clean washcloth.  5.  Apply the CHG Soap to your body ONLY FROM THE NECK DOWN.   Do   not use on face/ open                           Wound or open sores. Avoid contact with eyes, ears mouth and   genitals (private parts).                       Wash face,  Genitals (private parts) with your normal soap.             6.  Wash thoroughly, paying special attention to the area where your    surgery  will be  performed.  7.  Thoroughly rinse your body with warm water from the neck down.  8.  DO NOT shower/wash with your normal soap after using and rinsing off the CHG Soap.                9.  Pat yourself dry with a clean towel.            10.  Wear clean pajamas.            11.  Place clean sheets on your bed the night of your first shower and do not  sleep with pets. Day of Surgery : Do not apply any CHG, lotions/deodorants the morning of surgery.  Please wear clean clothes to the hospital/surgery center.  FAILURE TO FOLLOW THESE INSTRUCTIONS MAY RESULT IN THE CANCELLATION OF YOUR SURGERY  PATIENT SIGNATURE_________________________________  NURSE SIGNATURE__________________________________  ________________________________________________________________________

## 2024-06-28 NOTE — Progress Notes (Addendum)
 Anesthesia Review:  PCP: none  Cardiologist : none   PPM/ ICD: Device Orders: Rep Notified:  Chest x-ray : EKG : Echo : Stress test: Cardiac Cath :   Activity level: can do a flight of stairs wtihout difficutly  Sleep Study/ CPAP : none  Fasting Blood Sugar :      / Checks Blood Sugar -- times a day:    Blood Thinner/ Instructions /Last Dose: ASA / Instructions/ Last Dose :    PT with colostomy .   Pt aware to bring supplies DOS.

## 2024-07-03 ENCOUNTER — Encounter (HOSPITAL_COMMUNITY)
Admission: RE | Admit: 2024-07-03 | Discharge: 2024-07-03 | Disposition: A | Discharge status: Home / Self Care | Source: Ambulatory Visit | Attending: General Surgery | Admitting: General Surgery

## 2024-07-03 ENCOUNTER — Other Ambulatory Visit: Payer: Self-pay

## 2024-07-03 ENCOUNTER — Encounter (HOSPITAL_COMMUNITY): Payer: Self-pay

## 2024-07-03 VITALS — BP 144/89 | HR 72 | Temp 98.4°F | Resp 16 | Ht 74.0 in | Wt 198.0 lb

## 2024-07-03 DIAGNOSIS — Z01818 Encounter for other preprocedural examination: Secondary | ICD-10-CM | POA: Diagnosis not present

## 2024-07-03 HISTORY — DX: Unspecified osteoarthritis, unspecified site: M19.90

## 2024-07-03 LAB — CBC
HCT: 46 % (ref 39.0–52.0)
Hemoglobin: 15.9 g/dL (ref 13.0–17.0)
MCH: 32.1 pg (ref 26.0–34.0)
MCHC: 34.6 g/dL (ref 30.0–36.0)
MCV: 92.7 fL (ref 80.0–100.0)
Platelets: 165 K/uL (ref 150–400)
RBC: 4.96 MIL/uL (ref 4.22–5.81)
RDW: 12.8 % (ref 11.5–15.5)
WBC: 6.1 K/uL (ref 4.0–10.5)
nRBC: 0 % (ref 0.0–0.2)

## 2024-07-03 LAB — BASIC METABOLIC PANEL WITH GFR
Anion gap: 9 (ref 5–15)
BUN: 15 mg/dL (ref 6–20)
CO2: 24 mmol/L (ref 22–32)
Calcium: 9.1 mg/dL (ref 8.9–10.3)
Chloride: 106 mmol/L (ref 98–111)
Creatinine, Ser: 1.09 mg/dL (ref 0.61–1.24)
GFR, Estimated: >60 mL/min (ref >60)
Glucose, Bld: 95 mg/dL (ref 70–99)
Potassium: 4.5 mmol/L (ref 3.5–5.1)
Sodium: 139 mmol/L (ref 135–145)

## 2024-07-18 ENCOUNTER — Ambulatory Visit (HOSPITAL_COMMUNITY)
Admission: RE | Admit: 2024-07-18 | Discharge: 2024-07-18 | Disposition: A | Source: Ambulatory Visit | Attending: General Surgery | Admitting: General Surgery

## 2024-07-18 ENCOUNTER — Ambulatory Visit (HOSPITAL_COMMUNITY): Payer: Self-pay | Admitting: Physician Assistant

## 2024-07-18 ENCOUNTER — Encounter (HOSPITAL_COMMUNITY): Payer: Self-pay | Admitting: General Surgery

## 2024-07-18 ENCOUNTER — Other Ambulatory Visit: Payer: Self-pay

## 2024-07-18 ENCOUNTER — Ambulatory Visit (HOSPITAL_COMMUNITY): Admitting: Certified Registered Nurse Anesthetist

## 2024-07-18 ENCOUNTER — Encounter (HOSPITAL_COMMUNITY)
Admission: RE | Disposition: A | Discharge status: Home / Self Care | Payer: Self-pay | Source: Ambulatory Visit | Attending: General Surgery

## 2024-07-18 DIAGNOSIS — Z01818 Encounter for other preprocedural examination: Secondary | ICD-10-CM

## 2024-07-18 DIAGNOSIS — K432 Incisional hernia without obstruction or gangrene: Secondary | ICD-10-CM | POA: Diagnosis not present

## 2024-07-18 DIAGNOSIS — Z87891 Personal history of nicotine dependence: Secondary | ICD-10-CM | POA: Diagnosis not present

## 2024-07-18 HISTORY — PX: INCISIONAL HERNIA REPAIR: SHX193

## 2024-07-18 SURGERY — REPAIR, HERNIA, INCISIONAL
Anesthesia: General | Site: Abdomen

## 2024-07-18 MED ORDER — FENTANYL CITRATE (PF) 100 MCG/2ML IJ SOLN
INTRAMUSCULAR | Status: DC | PRN
  Administered 2024-07-18: 100 ug via INTRAVENOUS

## 2024-07-18 MED ORDER — FENTANYL CITRATE (PF) 100 MCG/2ML IJ SOLN
INTRAMUSCULAR | Status: AC
  Filled 2024-07-18: qty 2

## 2024-07-18 MED ORDER — MIDAZOLAM HCL 5 MG/5ML IJ SOLN
INTRAMUSCULAR | Status: DC | PRN
  Administered 2024-07-18: 2 mg via INTRAVENOUS

## 2024-07-18 MED ORDER — LACTATED RINGERS IV SOLN: INTRAVENOUS | Status: DC

## 2024-07-18 MED ORDER — CELECOXIB 200 MG PO CAPS
200.0000 mg | ORAL_CAPSULE | ORAL | Status: AC
  Administered 2024-07-18: 200 mg via ORAL
  Filled 2024-07-18: qty 1

## 2024-07-18 MED ORDER — HYDROMORPHONE HCL 1 MG/ML IJ SOLN: 0.2500 mg | INTRAMUSCULAR | Status: DC | PRN

## 2024-07-18 MED ORDER — ORAL CARE MOUTH RINSE: 15.0000 mL | Freq: Once | OROMUCOSAL | Status: AC

## 2024-07-18 MED ORDER — MEPERIDINE HCL 25 MG/ML IJ SOLN: 6.2500 mg | INTRAMUSCULAR | Status: DC | PRN

## 2024-07-18 MED ORDER — OXYCODONE HCL 5 MG PO TABS
ORAL_TABLET | ORAL | Status: AC
  Filled 2024-07-18: qty 1

## 2024-07-18 MED ORDER — CHLORHEXIDINE GLUCONATE 0.12 % MT SOLN
15.0000 mL | Freq: Once | OROMUCOSAL | Status: AC
  Administered 2024-07-18: 15 mL via OROMUCOSAL

## 2024-07-18 MED ORDER — ROCURONIUM BROMIDE 10 MG/ML (PF) SYRINGE
PREFILLED_SYRINGE | INTRAVENOUS | Status: DC | PRN
  Administered 2024-07-18: 50 mg via INTRAVENOUS

## 2024-07-18 MED ORDER — AMISULPRIDE (ANTIEMETIC) 5 MG/2ML IV SOLN
INTRAVENOUS | Status: AC
  Filled 2024-07-18: qty 4

## 2024-07-18 MED ORDER — SODIUM CHLORIDE 0.9% FLUSH: 3.0000 mL | Freq: Two times a day (BID) | INTRAVENOUS | Status: DC

## 2024-07-18 MED ORDER — SODIUM CHLORIDE 0.9 % IV SOLN: 250.0000 mL | INTRAVENOUS | Status: DC | PRN

## 2024-07-18 MED ORDER — ACETAMINOPHEN 500 MG PO TABS
1000.0000 mg | ORAL_TABLET | ORAL | Status: AC
  Administered 2024-07-18: 1000 mg via ORAL
  Filled 2024-07-18: qty 2

## 2024-07-18 MED ORDER — ACETAMINOPHEN 650 MG RE SUPP: 650.0000 mg | RECTAL | Status: DC | PRN

## 2024-07-18 MED ORDER — SODIUM CHLORIDE 0.9% FLUSH: 3.0000 mL | INTRAVENOUS | Status: DC | PRN

## 2024-07-18 MED ORDER — ONDANSETRON HCL 4 MG/2ML IJ SOLN
INTRAMUSCULAR | Status: DC | PRN
  Administered 2024-07-18: 4 mg via INTRAVENOUS

## 2024-07-18 MED ORDER — PROPOFOL 10 MG/ML IV BOLUS
INTRAVENOUS | Status: AC
  Filled 2024-07-18: qty 20

## 2024-07-18 MED ORDER — CEFAZOLIN SODIUM-DEXTROSE 2-4 GM/100ML-% IV SOLN
2.0000 g | INTRAVENOUS | Status: AC
  Administered 2024-07-18: 2 g via INTRAVENOUS
  Filled 2024-07-18: qty 100

## 2024-07-18 MED ORDER — 0.9 % SODIUM CHLORIDE (POUR BTL) OPTIME
TOPICAL | Status: DC | PRN
  Administered 2024-07-18: 1000 mL

## 2024-07-18 MED ORDER — ACETAMINOPHEN 325 MG PO TABS: 650.0000 mg | ORAL_TABLET | ORAL | Status: DC | PRN

## 2024-07-18 MED ORDER — BUPIVACAINE-EPINEPHRINE (PF) 0.25% -1:200000 IJ SOLN
INTRAMUSCULAR | Status: AC
  Filled 2024-07-18: qty 30

## 2024-07-18 MED ORDER — SUGAMMADEX SODIUM 200 MG/2ML IV SOLN
INTRAVENOUS | Status: DC | PRN
  Administered 2024-07-18: 200 mg via INTRAVENOUS

## 2024-07-18 MED ORDER — OXYCODONE HCL 5 MG PO TABS: 5.0000 mg | ORAL_TABLET | ORAL | Status: DC | PRN

## 2024-07-18 MED ORDER — AMISULPRIDE (ANTIEMETIC) 5 MG/2ML IV SOLN
10.0000 mg | Freq: Once | INTRAVENOUS | Status: AC | PRN
  Administered 2024-07-18: 10 mg via INTRAVENOUS

## 2024-07-18 MED ORDER — BUPIVACAINE-EPINEPHRINE 0.25% -1:200000 IJ SOLN
INTRAMUSCULAR | Status: DC | PRN
  Administered 2024-07-18: 30 mL

## 2024-07-18 MED ORDER — LIDOCAINE 2% (20 MG/ML) 5 ML SYRINGE
INTRAMUSCULAR | Status: DC | PRN
  Administered 2024-07-18: 80 mg via INTRAVENOUS

## 2024-07-18 MED ORDER — OXYCODONE HCL 5 MG/5ML PO SOLN: 5.0000 mg | Freq: Once | ORAL | Status: AC | PRN

## 2024-07-18 MED ORDER — OXYCODONE HCL 5 MG PO TABS
5.0000 mg | ORAL_TABLET | Freq: Once | ORAL | Status: AC | PRN
  Administered 2024-07-18: 5 mg via ORAL

## 2024-07-18 MED ORDER — DEXAMETHASONE SOD PHOSPHATE PF 10 MG/ML IJ SOLN
INTRAMUSCULAR | Status: DC | PRN
  Administered 2024-07-18: 10 mg via INTRAVENOUS

## 2024-07-18 MED ORDER — MIDAZOLAM HCL 2 MG/2ML IJ SOLN
INTRAMUSCULAR | Status: AC
  Filled 2024-07-18: qty 2

## 2024-07-18 MED ORDER — PROPOFOL 10 MG/ML IV BOLUS
INTRAVENOUS | Status: DC | PRN
  Administered 2024-07-18: 180 mg via INTRAVENOUS

## 2024-07-18 MED ORDER — OXYCODONE-ACETAMINOPHEN 5-325 MG PO TABS: 1.0000 | ORAL_TABLET | ORAL | 0 refills | Status: AC | PRN

## 2024-07-18 SURGICAL SUPPLY — 26 items
APPLICATOR COTTON TIP 6 STRL (MISCELLANEOUS) IMPLANT
BAG COUNTER SPONGE SURGICOUNT (BAG) IMPLANT
BARRIER SKIN OD1.75 2 1/4 FLNG (WOUND CARE) IMPLANT
CHLORAPREP W/TINT 26 (MISCELLANEOUS) ×1 IMPLANT
DERMABOND ADVANCED .7 DNX12 (GAUZE/BANDAGES/DRESSINGS) IMPLANT
DRAPE INCISE IOBAN 66X45 STRL (DRAPES) IMPLANT
DRAPE LAPAROSCOPIC ABDOMINAL (DRAPES) ×1 IMPLANT
DRAPE UTILITY XL STRL (DRAPES) ×1 IMPLANT
DRSG TEGADERM 4X4.75 (GAUZE/BANDAGES/DRESSINGS) IMPLANT
ELECT REM PT RETURN 15FT ADLT (MISCELLANEOUS) ×1 IMPLANT
GAUZE 4X4 16PLY ~~LOC~~+RFID DBL (SPONGE) IMPLANT
GLOVE BIO SURGEON STRL SZ 6.5 (GLOVE) ×1 IMPLANT
GLOVE INDICATOR 6.5 STRL GRN (GLOVE) ×2 IMPLANT
GOWN STRL REUS W/ TWL XL LVL3 (GOWN DISPOSABLE) ×2 IMPLANT
KIT BASIN OR (CUSTOM PROCEDURE TRAY) ×1 IMPLANT
KIT TURNOVER KIT A (KITS) ×1 IMPLANT
MESH VENTRALEX ST 1-7/10 CRC S (Mesh General) IMPLANT
NDL HYPO 22X1.5 SAFETY MO (MISCELLANEOUS) ×1 IMPLANT
PACK GENERAL/GYN (CUSTOM PROCEDURE TRAY) ×1 IMPLANT
STAPLER SKIN PROX 35W (STAPLE) IMPLANT
SUT NOVA 0 T19/GS 22DT (SUTURE) IMPLANT
SUT NOVA NAB DX-16 0-1 5-0 T12 (SUTURE) IMPLANT
SUT VIC AB 2-0 SH 18 (SUTURE) ×1 IMPLANT
SUT VIC AB 4-0 PS2 18 (SUTURE) ×1 IMPLANT
SYR 20ML LL LF (SYRINGE) ×1 IMPLANT
TOWEL OR 17X26 10 PK STRL BLUE (TOWEL DISPOSABLE) ×1 IMPLANT

## 2024-07-18 NOTE — Transfer of Care (Signed)
 Immediate Anesthesia Transfer of Care Note  Patient: Seth Villanueva  Procedure(s) Performed: REPAIR, HERNIA, INCISIONAL, WITH MESH (Abdomen)  Patient Location: PACU  Anesthesia Type:General  Level of Consciousness: awake  Airway & Oxygen Therapy: Patient Spontanous Breathing  Post-op Assessment: Report given to RN and Post -op Vital signs reviewed and stable  Post vital signs: Reviewed and stable  Last Vitals:  Vitals Value Taken Time  BP    Temp    Pulse    Resp    SpO2      Last Pain:  Vitals:   07/18/24 0603  TempSrc: Oral  PainSc: 0-No pain         Complications: No notable events documented.

## 2024-07-18 NOTE — Discharge Instructions (Addendum)
LAPAROSCOPIC SURGERY: POST OP INSTRUCTIONS  DIET: Follow a light bland diet the first 24 hours after arrival home, such as soup, liquids, crackers, etc.  Be sure to include lots of fluids daily.  Avoid fast food or heavy meals as your are more likely to get nauseated.  Eat a low fat the next few days after surgery.   Take your usually prescribed home medications unless otherwise directed. PAIN CONTROL: Pain is best controlled by a usual combination of three different methods TOGETHER: Ice/Heat Over the counter pain medication Prescription pain medication Most patients will experience some swelling and bruising around the incisions.  Ice packs or heating pads (30-60 minutes up to 6 times a day) will help. Use ice for the first few days to help decrease swelling and bruising, then switch to heat to help relax tight/sore spots and speed recovery.  Some people prefer to use ice alone, heat alone, alternating between ice & heat.  Experiment to what works for you.  Swelling and bruising can take several weeks to resolve.   It is helpful to take an over-the-counter pain medication regularly for the first few weeks.  Choose one of the following that works best for you: Naproxen (Aleve, etc)  Two 220mg tabs twice a day Ibuprofen (Advil, etc) Three 200mg tabs four times a day (every meal & bedtime) A  prescription for pain medication (such as percocet, vicodin, oxycodone, hydrocodone, etc) should be given to you upon discharge.  Take your pain medication as prescribed.  If you are having problems/concerns with the prescription medicine (does not control pain, nausea, vomiting, rash, itching, etc), please call us [phone] to see if we need to switch you to a different pain medicine that will work better for you and/or control your side effect better. If you need a refill on your pain medication, please contact your pharmacy.  They will contact our office to request authorization. Prescriptions will not be  filled after 5 pm or on week-ends.   Avoid getting constipated.  Between the surgery and the pain medications, it is common to experience some constipation.  Increasing fluid intake and taking a fiber supplement (such as Metamucil, Citrucel, FiberCon, MiraLax, etc) 1-2 times a day regularly will usually help prevent this problem from occurring.  A mild laxative (prune juice, Milk of Magnesia, MiraLax, etc) should be taken according to package directions if there are no bowel movements after 48 hours.   Watch out for diarrhea.  If you have many loose bowel movements, simplify your diet to bland foods & liquids for a few days.  Stop any stool softeners and decrease your fiber supplement.  Switching to mild anti-diarrheal medications (Kayopectate, Pepto Bismol) can help.  If this worsens or does not improve, please call us. Wash / shower every day.  You may shower over the dressings as they are waterproof.  Continue to shower over incision(s) after the dressing is off. Remove your waterproof bandages 5 days after surgery.  You may leave the incision open to air.  You may replace a dressing/Band-Aid to cover the incision for comfort if you wish.  ACTIVITIES as tolerated:   You may resume regular (light) daily activities beginning the next day--such as daily self-care, walking, climbing stairs--gradually increasing activities as tolerated.  If you can walk 30 minutes without difficulty, it is safe to try more intense activity such as jogging, treadmill, bicycling, low-impact aerobics, swimming, etc. Save the most intensive and strenuous activity for last such as sit-ups, heavy   lifting, contact sports, etc  Refrain from any heavy lifting or straining until you are off narcotics for pain control.   DO NOT PUSH THROUGH PAIN.  Let pain be your guide: If it hurts to do something, don't do it.  Pain is your body warning you to avoid that activity for another week until the pain goes down. You may drive when you are  no longer taking prescription pain medication, you can comfortably wear a seatbelt, and you can safely maneuver your car and apply brakes. You may have sexual intercourse when it is comfortable.  FOLLOW UP in our office Please call [facility] at [phone] to set up an appointment to see your surgeon in the office for a follow-up appointment approximately 2-3 weeks after your surgery. Make sure that you call for this appointment the day you arrive home to insure a convenient appointment time. 10. IF YOU HAVE DISABILITY OR FAMILY LEAVE FORMS, BRING THEM TO THE OFFICE FOR PROCESSING.  DO NOT GIVE THEM TO YOUR DOCTOR.   WHEN TO CALL US [phone]: Poor pain control Reactions / problems with new medications (rash/itching, nausea, etc)  Fever over 101.5 F (38.5 C) Inability to urinate Nausea and/or vomiting Worsening swelling or bruising Continued bleeding from incision. Increased pain, redness, or drainage from the incision   The clinic staff is available to answer your questions during regular business hours (8:30am-5pm).  Please don't hesitate to call and ask to speak to one of our nurses for clinical concerns.   If you have a medical emergency, go to the nearest emergency room or call 911.  A surgeon from [facility] is always on call at the hospitals   [facility] [address], [address], [place], [place]  [address] ? MAIN: [phone] ? TOLL FREE: [phone] ?  FAX [phone] [email or web address]   

## 2024-07-18 NOTE — Anesthesia Procedure Notes (Signed)
 Procedure Name: Intubation Date/Time: 07/18/2024 7:26 AM  Performed by: Cindie Charleen PARAS, CRNAPre-anesthesia Checklist: Patient identified, Emergency Drugs available, Suction available, Patient being monitored and Timeout performed Patient Re-evaluated:Patient Re-evaluated prior to induction Oxygen Delivery Method: Circle system utilized Preoxygenation: Pre-oxygenation with 100% oxygen Induction Type: IV induction Ventilation: Mask ventilation without difficulty Laryngoscope Size: Mac and 4 Grade View: Grade I Tube type: Oral Tube size: 7.5 mm Number of attempts: 1 Airway Equipment and Method: Stylet Placement Confirmation: ETT inserted through vocal cords under direct vision, positive ETCO2 and breath sounds checked- equal and bilateral Secured at: 23 cm Tube secured with: Tape Dental Injury: Teeth and Oropharynx as per pre-operative assessment

## 2024-07-18 NOTE — Op Note (Signed)
 07/18/2024  8:10 AM  PATIENT:  Seth Villanueva  43 y.o. male  Patient Care Team: Patient, No Pcp Per as PCP - General (General Practice) Dewey [facility], MD as Consulting Physician (Radiation Oncology) Cloretta Arley KATHEE, MD as Consulting Physician (Oncology) Debby Hila, MD as Consulting Physician (General Surgery)  PRE-OPERATIVE DIAGNOSIS:  INCISIONAL HERNIA  POST-OPERATIVE DIAGNOSIS:  INCISIONAL HERNIA  PROCEDURE:  REPAIR INCISIONAL HERNIA WITH MESH    Surgeon(s): Debby Hila, MD  ASSISTANT: none   ANESTHESIA:   local and general  EBL:  No intake/output data recorded.  DRAINS: none   SPECIMEN:  No Specimen  DISPOSITION OF SPECIMEN:  N/A  COUNTS:  YES  PLAN OF CARE: Discharge to home after PACU  PATIENT DISPOSITION:  PACU - hemodynamically stable.  INDICATION: 43 y.o. M with midline port site hernia.     OR FINDINGS: 1 cm port site hernia  DESCRIPTION: the patient was identified in the preoperative holding area and taken to the OR where they were laid supine on the operating room table.  General anesthesia was induced without difficulty. SCDs were also noted to be in place prior to the initiation of anesthesia.  The patient was then prepped and draped in the usual sterile fashion.   A surgical timeout was performed indicating the correct patient, procedure, positioning and need for preoperative antibiotics.  I began by making an incision over the hernia site using a 10 blade scalpel.  This was carried down through the subcutaneous tissues.  I dissected around the hernia using blunt dissection and cautery.  I isolated the entire hernia content, which was omentum only.  I divided the distal portion of this using electrocautery and confirmed hemostasis.  The rest of the herniated fat was placed back into the abdomen.  The abdominal wall was swept bluntly and cleared.  The hernia was approximately 1 cm in diameter.  Given the patient's physical job, we decided to place a  Bard Ventralex 4.3 cm patch under the hernia closure.  This was placed and the hernia with 4 interrupted 0 Novafil sutures including the patch fixture into the closure.  The subcutaneous tissue and dermal layer was closed using interrupted 2-0 Vicryl suture.  The skin was closed using a running 4-0 Vicryl subcuticular suture.  Dermabond was applied.  The patient was then awakened from anesthesia and sent to the postanesthesia care unit in stable condition.  All counts were correct per operating room staff.  Hila JAYSON Debby, MD  Colorectal and General Surgery [facility]

## 2024-07-18 NOTE — Anesthesia Postprocedure Evaluation (Signed)
 Anesthesia Post Note  Patient: Seth Villanueva  Procedure(s) Performed: REPAIR, HERNIA, INCISIONAL, WITH MESH (Abdomen)     Patient location during evaluation: PACU Anesthesia Type: General Level of consciousness: awake and alert Pain management: pain level controlled Vital Signs Assessment: post-procedure vital signs reviewed and stable Respiratory status: spontaneous breathing, nonlabored ventilation and respiratory function stable Cardiovascular status: blood pressure returned to baseline and stable Postop Assessment: no apparent nausea or vomiting Anesthetic complications: no   No notable events documented.  Last Vitals:  Vitals:   07/18/24 0848 07/18/24 0900  BP:  (!) 136/93  Pulse: 76 74  Resp: 11 12  Temp:    SpO2: 99% 99%    Last Pain:  Vitals:   07/18/24 0848  TempSrc:   PainSc: 4                  Butler Levander Pinal

## 2024-07-18 NOTE — H&P (Signed)
 PROVIDER:  Wilmer Berryhill CHRISTINE Kareem Cathey, MD   MRN: [id number] DOB: 1981/01/23    Subjective     History of Present Illness: Seth Villanueva is a 43 y.o. male who has noticed a bulge in his upper abdomen that has been painful at least once.  It reduces with palpation.  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     Review of Systems: A complete review of systems was obtained from the patient.  I have reviewed this information and discussed as appropriate with the patient.  See HPI as well for other ROS.     Medical History: Past Medical History         Past Medical History:  Diagnosis Date   History of cancer          Problem List       Patient Active Problem List  Diagnosis   Rectal cancer (CMS/HHS-HCC)        Past Surgical History           Past Surgical History:  Procedure Laterality Date   COLON SURGERY            Allergies         Allergies  Allergen Reactions   Azithromycin Hives   Prochlorperazine  Anxiety and Other (See Comments)        Medications Ordered Prior to Encounter             Current Outpatient Medications on File Prior to Visit  Medication Sig Dispense Refill   cetirizine (ZYRTEC) 10 MG tablet Take 10 mg by mouth once daily as needed for Allergies       montelukast  (SINGULAIR ) 10 mg tablet Take 10 mg by mouth at bedtime        No current facility-administered medications on file prior to visit.        Family History  History reviewed. No pertinent family history.      Tobacco Use History  Social History           Tobacco Use  Smoking Status Former   Types: Cigarettes  Smokeless Tobacco Never        Social History  Social History             Socioeconomic History   Marital status: Single  Tobacco Use   Smoking status: Former      Types: Cigarettes   Smokeless tobacco: Never  Substance and Sexual Activity   Drug use: Never    Social Drivers of Health           Housing Stability: Unknown  (06/05/2024)    Housing Stability Vital Sign     Homeless in the Last Year: No        Objective:      Vitals:   07/18/24 0603  BP: (!) 138/94  Pulse: 75  Resp: 16  Temp: 98.4 F (36.9 C)  SpO2: 97%      Exam Gen: NAD CV: RRR Pulm: CTA Abd: soft 1 cm defect in the fascia at previous port site.     Labs, Imaging and Diagnostic Testing:     Assessment and Plan:  Incisional hernia without obstruction or gangrene  (primary encounter diagnosis)     Patient has a port site hernia.  Will close with mesh reinforcement.  Risks include bleeding, pain and recurrence. All questions answered.  Patient wishes to proceed with surgery.   Bernarda JAYSON Ned, MD Colon and Rectal Surgery [facility]

## 2024-07-18 NOTE — Anesthesia Preprocedure Evaluation (Signed)
 Anesthesia Evaluation  Patient identified by MRN, date of birth, ID band Patient awake    Reviewed: Allergy  & Precautions, H&P , NPO status , Patient's Chart, lab work & pertinent test results  Airway Mallampati: II  TM Distance: >3 FB Neck ROM: Full    Dental  (+) Dental Advisory Given   Pulmonary neg pulmonary ROS, former smoker   Pulmonary exam normal breath sounds clear to auscultation       Cardiovascular negative cardio ROS Normal cardiovascular exam Rhythm:Regular Rate:Normal     Neuro/Psych  Neuromuscular disease  negative psych ROS   GI/Hepatic negative GI ROS, Neg liver ROS,,,  Endo/Other  negative endocrine ROS    Renal/GU negative Renal ROS  negative genitourinary   Musculoskeletal  (+) Arthritis ,    Abdominal   Peds negative pediatric ROS (+)  Hematology negative hematology ROS (+)   Anesthesia Other Findings   Reproductive/Obstetrics negative OB ROS                              Anesthesia Physical Anesthesia Plan  ASA: 2  Anesthesia Plan: General   Post-op Pain Management: Tylenol  PO (pre-op)* and Celebrex  PO (pre-op)*   Induction: Intravenous  PONV Risk Score and Plan: 2 and Ondansetron , Midazolam  and Treatment may vary due to age or medical condition  Airway Management Planned: Oral ETT  Additional Equipment:   Intra-op Plan:   Post-operative Plan: Extubation in OR  Informed Consent: I have reviewed the patients History and Physical, chart, labs and discussed the procedure including the risks, benefits and alternatives for the proposed anesthesia with the patient or authorized representative who has indicated his/her understanding and acceptance.     Dental advisory given  Plan Discussed with: CRNA  Anesthesia Plan Comments:         Anesthesia Quick Evaluation

## 2024-07-19 ENCOUNTER — Encounter (HOSPITAL_COMMUNITY): Payer: Self-pay | Admitting: General Surgery
